# Patient Record
Sex: Male | Born: 1940
Health system: Southern US, Community
[De-identification: ages and names within clinical notes are randomized; demographics above are authoritative.]

## PROBLEM LIST (undated history)

## (undated) DIAGNOSIS — R739 Hyperglycemia, unspecified: Secondary | ICD-10-CM

## (undated) DIAGNOSIS — K219 Gastro-esophageal reflux disease without esophagitis: Secondary | ICD-10-CM

## (undated) DIAGNOSIS — R911 Solitary pulmonary nodule: Secondary | ICD-10-CM

## (undated) DIAGNOSIS — I10 Essential (primary) hypertension: Secondary | ICD-10-CM

## (undated) DIAGNOSIS — M1990 Unspecified osteoarthritis, unspecified site: Secondary | ICD-10-CM

## (undated) DIAGNOSIS — N390 Urinary tract infection, site not specified: Secondary | ICD-10-CM

## (undated) DIAGNOSIS — E785 Hyperlipidemia, unspecified: Secondary | ICD-10-CM

## (undated) DIAGNOSIS — M79671 Pain in right foot: Secondary | ICD-10-CM

## (undated) DIAGNOSIS — G5603 Carpal tunnel syndrome, bilateral upper limbs: Secondary | ICD-10-CM

## (undated) DIAGNOSIS — K759 Inflammatory liver disease, unspecified: Secondary | ICD-10-CM

## (undated) HISTORY — PX: COLONOSCOPY W/ POLYPECTOMY: SHX1380

## (undated) HISTORY — PX: TONSILLECTOMY: SUR1361

## (undated) HISTORY — PX: SKIN CANCER EXCISION: SHX779

## (undated) HISTORY — PX: APPENDECTOMY: SHX54

## (undated) HISTORY — DX: Carpal tunnel syndrome, bilateral upper limbs: G56.03

---

## 1964-06-20 DIAGNOSIS — K759 Inflammatory liver disease, unspecified: Secondary | ICD-10-CM

## 1964-06-20 HISTORY — DX: Inflammatory liver disease, unspecified: K75.9

## 1998-01-29 ENCOUNTER — Other Ambulatory Visit: Admission: RE | Admit: 1998-01-29 | Discharge: 1998-01-29 | Payer: Self-pay | Admitting: Urology

## 1998-06-20 HISTORY — PX: OTHER SURGICAL HISTORY: SHX169

## 1999-09-20 ENCOUNTER — Other Ambulatory Visit: Admission: RE | Admit: 1999-09-20 | Discharge: 1999-09-20 | Payer: Self-pay | Admitting: Urology

## 2000-08-11 ENCOUNTER — Other Ambulatory Visit: Admission: RE | Admit: 2000-08-11 | Discharge: 2000-08-11 | Payer: Self-pay | Admitting: Urology

## 2000-08-11 ENCOUNTER — Encounter (INDEPENDENT_AMBULATORY_CARE_PROVIDER_SITE_OTHER): Payer: Self-pay | Admitting: Specialist

## 2001-05-14 ENCOUNTER — Encounter: Admission: RE | Admit: 2001-05-14 | Discharge: 2001-05-14 | Payer: Self-pay | Admitting: Family Medicine

## 2001-05-14 ENCOUNTER — Encounter: Payer: Self-pay | Admitting: Family Medicine

## 2008-03-10 ENCOUNTER — Encounter: Admission: RE | Admit: 2008-03-10 | Discharge: 2008-03-10 | Payer: Self-pay | Admitting: Internal Medicine

## 2008-08-27 ENCOUNTER — Ambulatory Visit (HOSPITAL_COMMUNITY): Admission: RE | Admit: 2008-08-27 | Discharge: 2008-08-27 | Payer: Self-pay | Admitting: Urology

## 2009-03-16 ENCOUNTER — Encounter: Admission: RE | Admit: 2009-03-16 | Discharge: 2009-03-16 | Payer: Self-pay | Admitting: Internal Medicine

## 2010-07-11 ENCOUNTER — Encounter: Payer: Self-pay | Admitting: Urology

## 2010-07-11 ENCOUNTER — Encounter: Payer: Self-pay | Admitting: Gastroenterology

## 2011-02-03 ENCOUNTER — Observation Stay (HOSPITAL_COMMUNITY)
Admission: EM | Admit: 2011-02-03 | Discharge: 2011-02-04 | Disposition: A | Discharge status: Home / Self Care | Payer: Medicare Other | Attending: Internal Medicine | Admitting: Internal Medicine

## 2011-02-03 DIAGNOSIS — Z7982 Long term (current) use of aspirin: Secondary | ICD-10-CM | POA: Insufficient documentation

## 2011-02-03 DIAGNOSIS — I10 Essential (primary) hypertension: Secondary | ICD-10-CM | POA: Insufficient documentation

## 2011-02-03 DIAGNOSIS — R220 Localized swelling, mass and lump, head: Secondary | ICD-10-CM | POA: Insufficient documentation

## 2011-02-03 DIAGNOSIS — R0602 Shortness of breath: Secondary | ICD-10-CM | POA: Insufficient documentation

## 2011-02-03 DIAGNOSIS — T782XXA Anaphylactic shock, unspecified, initial encounter: Principal | ICD-10-CM | POA: Insufficient documentation

## 2011-02-03 DIAGNOSIS — Z79899 Other long term (current) drug therapy: Secondary | ICD-10-CM | POA: Insufficient documentation

## 2011-02-03 DIAGNOSIS — R221 Localized swelling, mass and lump, neck: Secondary | ICD-10-CM | POA: Insufficient documentation

## 2011-02-03 DIAGNOSIS — E559 Vitamin D deficiency, unspecified: Secondary | ICD-10-CM | POA: Insufficient documentation

## 2011-02-03 DIAGNOSIS — E7800 Pure hypercholesterolemia, unspecified: Secondary | ICD-10-CM | POA: Insufficient documentation

## 2011-02-03 LAB — CBC
HCT: 39.1 % (ref 39.0–52.0)
Hemoglobin: 13.6 g/dL (ref 13.0–17.0)
MCH: 32 pg (ref 26.0–34.0)
MCHC: 34.8 g/dL (ref 30.0–36.0)
MCV: 92 fL (ref 78.0–100.0)
Platelets: 117 10*3/uL — ABNORMAL LOW (ref 150–400)
RBC: 4.25 MIL/uL (ref 4.22–5.81)
RDW: 12.8 % (ref 11.5–15.5)
WBC: 12.8 10*3/uL — ABNORMAL HIGH (ref 4.0–10.5)

## 2011-02-03 LAB — POCT I-STAT, CHEM 8
BUN: 20 mg/dL (ref 6–23)
Calcium, Ion: 1.14 mmol/L (ref 1.12–1.32)
Chloride: 105 mEq/L (ref 96–112)
Creatinine, Ser: 1.2 mg/dL (ref 0.50–1.35)
Glucose, Bld: 149 mg/dL — ABNORMAL HIGH (ref 70–99)
HCT: 39 % (ref 39.0–52.0)
Hemoglobin: 13.3 g/dL (ref 13.0–17.0)
Potassium: 3.5 mEq/L (ref 3.5–5.1)
Sodium: 143 mEq/L (ref 135–145)
TCO2: 23 mmol/L (ref 0–100)

## 2011-02-03 LAB — DIFFERENTIAL
Basophils Absolute: 0.1 10*3/uL (ref 0.0–0.1)
Basophils Relative: 1 % (ref 0–1)
Eosinophils Absolute: 0.3 10*3/uL (ref 0.0–0.7)
Eosinophils Relative: 2 % (ref 0–5)
Lymphocytes Relative: 42 % (ref 12–46)
Lymphs Abs: 5.4 10*3/uL — ABNORMAL HIGH (ref 0.7–4.0)
Monocytes Absolute: 1 10*3/uL (ref 0.1–1.0)
Monocytes Relative: 8 % (ref 3–12)
Neutro Abs: 6 10*3/uL (ref 1.7–7.7)
Neutrophils Relative %: 47 % (ref 43–77)

## 2011-02-04 ENCOUNTER — Inpatient Hospital Stay (HOSPITAL_COMMUNITY): Payer: Medicare Other

## 2011-02-05 NOTE — Discharge Summary (Signed)
  [name] NO.:  [id number]  MEDICAL RECORD NO.:  [id number]  LOCATION:  1436                         FACILITY:  [facility]  PHYSICIAN:  Jonny Ruiz, MD    DATE OF BIRTH:  [birth year]  DATE OF ADMISSION:  02/03/2011 DATE OF DISCHARGE:  02/04/2011                              DISCHARGE SUMMARY   PRIMARY CARE PHYSICIAN:  Dr. Brynda Peon  REASON FOR HOSPITALIZATION:  Numbness of the tongue and throat swelling.  DISCHARGE DIAGNOSIS:  Anaphylactic reaction, etiology unknown.  SECONDARY DIAGNOSES: 1. Hypertension. 2. Hypercholesterolemia. 3. Vitamin D deficiency.  DISCHARGE MEDICATIONS: 1. Medrol Dosek 6 days 2. Zyrtec 10 mg once a day. 3. EpiPen. 4. Bisoprolol/hydrochlorothiazide 10/6.25 mg p.o. daily. 5. Aspirin 81 mg a day. 6. Vitamin D. 7. Simvastatin 20 mg a day.  HISTORY OF PRESENT ILLNESS:  The patient is a 70 year old man who presented to the emergency department complaining of numbness of his tongue and tightness of his throat associated with shortness of breath. For details of his history, please refer to dictated H and P by Dr. Sunnie Nielsen.  HOSPITAL COURSE:  The patient was seen in emergency department where he received an EpiPen as well as Benadryl, Pepcid, and started on Solu- Medrol 125 mg IV.  Subsequently, the patient was admitted by Dr. Sunnie Nielsen from the hospitalist service to telemetry unit for further observation.  His home medications were discontinued, and he was maintained on Benadryl 25 mg q.6 h., methylprednisolone 80 mg IV q.8 h. The patient did well overnight and his symptoms resolved by next day. Today, he is doing well and voices no symptoms.  Specifically, he denies throat discomfort, difficulty breathing, or skin changes.  The patient feels well and is in stable condition to be discharged home.  He has been advised to make an appointment to see his primary care physician, Dr. Terrilee Croak in 1 week to further arrange  for allergies consultation.  CONDITION ON DISCHARGE:  Stable.          ______________________________ Jonny Ruiz, MD     GL/MEDQ  D:  02/04/2011  T:  02/04/2011  Job:  [id number]  cc:   Brynda Peon Fax: [phone]  Electronically Signed by Jonny Ruiz MD on 02/05/2011 10:37:57 PM

## 2011-02-15 NOTE — H&P (Signed)
[name], [name] NO.:  [id number]  MEDICAL RECORD NO.:  [id number]  LOCATION:  WLED                         FACILITY:  [facility]  PHYSICIAN:  Hartley Barefoot, MD    DATE OF BIRTH:  02/01/1941  DATE OF ADMISSION:  02/03/2011 DATE OF DISCHARGE:                             HISTORY & PHYSICAL   CHIEF COMPLAINT:  Numbness on the tongue and throat swelling.  HISTORY OF PRESENT ILLNESS:  This is a 70 year old with past medical history of hypertension, hypercholesterolemia who presented to the emergency department complaining of numbness of his tongue and tightness of his throat and shortness of breath.  The patient related that he was in his usual state of health until this evening when he started to feel numbness of his tongue, then a spread to his mouth, then he felt tightness of his throat and difficulty swallowing and shortness of breath.  He said that he was eating at a restaurant when he started to have these symptoms.  He said that he ate some bread at the restaurant and these symptoms started before he ate salmon, the main course.  He said the morning of admission, he ate some chicken at home.  At the restaurant, he ate bread, salmon, polenta, squash.  He denies any new medication or any thing unusual.  He does relate that he has had 2 episodes of face swelling and a couple of months ago, probably May, June.  He went to urgent care and he was given prednisone taper and he was given an EpiPen.  He was instructed to follow with an allergen doctor, but he was not able to do that.  He did not recall or he did not want to speak about any particular food that might be associated with those 2 episodes back in May, July, he did not want to give too many details.  He said that he also had some poison ivy twice over the last summer and spring, that was during April.  He had some rash on his arms and face.  He was given some prednisone and that has  resolved.  ALLERGIES:  No know drug allergies.  PAST MEDICAL HISTORY: 1. Arthritis. 2. Hypertension. 3. Hypercholesterolemia.  HOME MEDICATIONS: 1. Bisoprolol and hydrochlorothiazide 10/6.25 mg p.o. daily. 2. Epinephrine, EpiPen. 3. Aspirin 81 mg p.o. daily. 4. Vitamin D p.o. daily. 5. Simvastatin 40 mg 0.5 tablets daily. 6. Zyrtec 10 mg p.o. daily. 7. Ibuprofen 200 mg 2-3 tablets every 8 hours as needed for pain.  SOCIAL HISTORY:  He is married.  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  He has 2 daughters and 1 stepdaughter.  He has 5 grandchildren. He denies smoking or recreational drugs.  He drinks rarely 2 bottle of beer every 3 weeks.  FAMILY HISTORY:  His father is 41 year old and he has a history of dementia.  His mother died at 1 years old, sudden death, unclear it was heart attack.  REVIEW OF SYSTEMS:  He does relate some shortness of breath on exertion, but he does not want to discuss about.  PHYSICAL EXAMINATION:  VITAL SIGNS:  Blood pressure 160/90, pulse 76, respirations 18, sat 98 on room  air, temperature 98. GENERAL:  The patient is sitting in bed in no acute distress. HEENT:  Head, traumatic normocephalic.  Eyes, anicteric, pupil equal, reactive to light.  Extraocular muscles intact.  Tongue, no significant swelling.  I was able to see his uvula, it was midline. NECK:  Supple.  No JVD.  No carotid bruits.  No stridor. CARDIOVASCULAR:  S1, S2.  Regular rhythm and rate.  No rubs, murmurs, or gallops. LUNGS:  Bilateral good air movement.  No wheezing, crackles, or rhonchi. EXTREMITIES:  No edema. NEURO:  Alert, oriented x2.  Cranial nerves II through XII intact. Sensation grossly intact.  Motor strength 5/5 throughout.  ASSESSMENT AND PLAN:  This is a 70 year old who presented with early anaphylaxis. 1. Ananaphylaxis, unclear etiology, probably food related.  The     patient is going to be admitted to the hospital for observation to     a telemetry bed.  We  will continue with Solu-Medrol and Benadryl.     His symptoms has improved significantly, initially the ED physician     was not able to see his airway, now we are able to see his uvula     and his airway, his tongue has decreased, he is now able to     swallow, and denies any shortness of breath.  He received     epinephrine in the emergency department.  I would avoid to start     any of his home medications at this time.  He will need to follow     with an allergen doctor.  Continue with Solu-Medrol and Benadryl.     He will need to be discharged on a taper of prednisone. 2. Hypertension.  I will hold on his blood pressure medication at this     time.  Monitor closely for any sign of hypotension in the setting     of early anaphylaxis. 3. Deep venous thrombosis prophylaxis, SCDs at this time.     Hartley Barefoot, MD     BR/MEDQ  D:  02/03/2011  T:  02/04/2011  Job:  [id number]  Electronically Signed by Hartley Barefoot MD on 02/15/2011 10:27:09 AM

## 2011-03-07 ENCOUNTER — Emergency Department (HOSPITAL_COMMUNITY)
Admission: EM | Admit: 2011-03-07 | Discharge: 2011-03-07 | Disposition: A | Discharge status: Home / Self Care | Payer: Medicare Other | Attending: Emergency Medicine | Admitting: Emergency Medicine

## 2011-03-07 DIAGNOSIS — E7800 Pure hypercholesterolemia, unspecified: Secondary | ICD-10-CM | POA: Insufficient documentation

## 2011-03-07 DIAGNOSIS — T7840XA Allergy, unspecified, initial encounter: Secondary | ICD-10-CM | POA: Insufficient documentation

## 2011-03-07 DIAGNOSIS — L500 Allergic urticaria: Secondary | ICD-10-CM | POA: Insufficient documentation

## 2011-03-07 DIAGNOSIS — X58XXXA Exposure to other specified factors, initial encounter: Secondary | ICD-10-CM | POA: Insufficient documentation

## 2011-03-07 DIAGNOSIS — I10 Essential (primary) hypertension: Secondary | ICD-10-CM | POA: Insufficient documentation

## 2011-03-07 DIAGNOSIS — R220 Localized swelling, mass and lump, head: Secondary | ICD-10-CM | POA: Insufficient documentation

## 2011-04-04 ENCOUNTER — Other Ambulatory Visit: Payer: Self-pay | Admitting: Allergy and Immunology

## 2011-04-04 DIAGNOSIS — J329 Chronic sinusitis, unspecified: Secondary | ICD-10-CM

## 2011-04-06 ENCOUNTER — Ambulatory Visit
Admission: RE | Admit: 2011-04-06 | Discharge: 2011-04-06 | Disposition: A | Discharge status: Home / Self Care | Payer: Medicare Other | Source: Ambulatory Visit | Attending: Allergy and Immunology | Admitting: Allergy and Immunology

## 2011-04-06 DIAGNOSIS — J329 Chronic sinusitis, unspecified: Secondary | ICD-10-CM

## 2012-01-21 ENCOUNTER — Ambulatory Visit (INDEPENDENT_AMBULATORY_CARE_PROVIDER_SITE_OTHER): Payer: Medicare Other | Admitting: Emergency Medicine

## 2012-01-21 VITALS — BP 124/86 | HR 67 | Temp 98.0°F | Resp 16

## 2012-01-21 DIAGNOSIS — M79609 Pain in unspecified limb: Secondary | ICD-10-CM

## 2012-01-21 DIAGNOSIS — M79604 Pain in right leg: Secondary | ICD-10-CM

## 2012-01-21 DIAGNOSIS — M79605 Pain in left leg: Secondary | ICD-10-CM

## 2012-01-21 MED ORDER — TRAMADOL HCL 50 MG PO TABS: 100.0000 mg | ORAL_TABLET | Freq: Three times a day (TID) | ORAL | Status: AC | PRN

## 2012-01-21 NOTE — Progress Notes (Signed)
   Date:  01/21/2012   Name:  [name]   DOB:  08/28/1940   MRN:  [id number]  PCP:  Pearla Dubonnet, MD    Chief Complaint: Leg Pain   History of Present Illness:  [name] is a 71 y.o. very pleasant male patient who presents with the following:  Several month history of pain in posterior thigh proximal to knees and now radiating into proximal calf.  Some pain deep in right buttock.  Leg pain is bilateral.  Waxes and wanes never gone.  No weakness or numbness.  Pain regardless of posture.  No history of back pain or history of injury.  No claudication or history of vascular disorder  There is no problem list on file for this patient.   No past medical history on file.  No past surgical history on file.  History  Substance Use Topics  . Smoking status: Never Smoker   . Smokeless tobacco: Not on file  . Alcohol Use: Not on file    No family history on file.  No Known Allergies  Medication list has been reviewed and updated.  Current Outpatient Prescriptions on File Prior to Visit  Medication Sig Dispense Refill  . loratadine (CLARITIN) 10 MG tablet Take 10 mg by mouth daily.      . simvastatin (ZOCOR) 20 MG tablet Take 20 mg by mouth every evening.        Review of Systems:  As per HPI, otherwise negative.    Physical Examination: Filed Vitals:   01/21/12 0858  BP: 124/86  Pulse: 67  Temp: 98 F (36.7 C)  Resp: 16   There were no vitals filed for this visit. There is no height or weight on file to calculate BMI. Ideal Body Weight:    GEN: WDWN, NAD, Non-toxic, A & O x 3 HEENT: Atraumatic, Normocephalic. Neck supple. No masses, No LAD. Ears and Nose: No external deformity. CV: RRR, No M/G/R. No JVD. No thrill. No extra heart sounds. PULM: CTA B, no wheezes, crackles, rhonchi. No retractions. No resp. distress. No accessory muscle use. ABD: S, NT, ND, +BS. No rebound. No HSM. EXTR: No c/c/e NEURO Normal gait. Gross motor strength and  DTR's intact Vascular:  DP and PT pulses absent bilaterally.  Little hair from knees.  Hair on toes. PSYCH: Normally interactive. Conversant. Not depressed or anxious appearing.  Calm demeanor.    Assessment and Plan: PVD vs Spinal impingement MRI Lower ext ultrasound Ultram Follow up after studies and as needed  Carmelina Dane, MD

## 2012-01-21 NOTE — Patient Instructions (Signed)
Radicular Pain Radicular pain in either the arm or leg is usually from a bulging or herniated disk in the spine. A piece of the herniated disk may press against the nerves as the nerves exit the spine. This causes pain which is felt at the tips of the nerves down the arm or leg. Other causes of radicular pain may include:  Fractures.   Heart disease.   Cancer.   An abnormal and usually degenerative state of the nervous system or nerves (neuropathy).  Diagnosis may require CT or MRI scanning to determine the primary cause.  Nerves that start at the neck (nerve roots) may cause radicular pain in the outer shoulder and arm. It can spread down to the thumb and fingers. The symptoms vary depending on which nerve root has been affected. In most cases radicular pain improves with conservative treatment. Neck problems may require physical therapy, a neck collar, or cervical traction. Treatment may take many weeks, and surgery may be considered if the symptoms do not improve.  Conservative treatment is also recommended for sciatica. Sciatica causes pain to radiate from the lower back or buttock area down the leg into the foot. Often there is a history of back problems. Most patients with sciatica are better after 2 to 4 weeks of rest and other supportive care. Short term bed rest can reduce the disk pressure considerably. Sitting, however, is not a good position since this increases the pressure on the disk. You should avoid bending, lifting, and all other activities which make the problem worse. Traction can be used in severe cases. Surgery is usually reserved for patients who do not improve within the first months of treatment. Only take over-the-counter or prescription medicines for pain, discomfort, or fever as directed by your caregiver. Narcotics and muscle relaxants may help by relieving more severe pain and spasm and by providing mild sedation. Cold or massage can give significant relief. Spinal  manipulation is not recommended. It can increase the degree of disc protrusion. Epidural steroid injections are often effective treatment for radicular pain. These injections deliver medicine to the spinal nerve in the space between the protective covering of the spinal cord and back bones (vertebrae). Your caregiver can give you more information about steroid injections. These injections are most effective when given within two weeks of the onset of pain.  You should see your caregiver for follow up care as recommended. A program for neck and back injury rehabilitation with stretching and strengthening exercises is an important part of management.  SEEK IMMEDIATE MEDICAL CARE IF:  You develop increased pain, weakness, or numbness in your arm or leg.   You develop difficulty with bladder or bowel control.   You develop abdominal pain.  Document Released: 07/14/2004 Document Revised: 05/26/2011 Document Reviewed: 09/29/2008 Exit[employer] Patient Information 2012 [employer]. 

## 2012-01-25 ENCOUNTER — Telehealth: Payer: Self-pay

## 2012-01-25 NOTE — Telephone Encounter (Signed)
Pt still in leg pain and was inquiring about the MRI that had been ordered for him by Dr. Dareen Piano on 01/21/12.  Explained that [employer] medicare had not yet approved the scan.  Checked with our clinical staff and [employer] has now requested Peer to Peer review.  Pt understands, but would like to be scheduled ASAP after approval.

## 2012-01-26 NOTE — Telephone Encounter (Signed)
I called and tried to do a peer to peer for pt's MRI but they were unable to cover it because he had no neurological symptoms on exam and no conservative treatment like PT had been done.  Reviewer suggested to withdraw vs deny the request due to not being able to obtain another request for MRI for 60d after denial.  I called and spoke with patient about ordering dopplers and ABI for LE and pt declined the referral at this time.  I suggested he have xrays performed or RTC because his pain was worsening.  Pt agreed and will call back when he makes up his mind about the next step.

## 2012-01-26 NOTE — Telephone Encounter (Signed)
This is for a PA to call for peer to peer review, will have someone call today.

## 2012-01-27 ENCOUNTER — Telehealth: Payer: Self-pay

## 2012-01-27 NOTE — Telephone Encounter (Signed)
Pt would like to talk to Dr Dareen Piano regarding his mri and it being denied. Pt is upset. He says this is urgent.

## 2012-01-27 NOTE — Telephone Encounter (Signed)
Patient MRI was denied by insurance company patient has been advised to return to clinic for follow up will not speak to me concerning this. Upset because Dr Dareen Piano did not call him but had Maralyn Sago call him. He wants to know how to proceed from here, patient states [employer] has denied b/c he has not had physical therapy for this condition, also patient states he is willing to proceed with a doppler study if Dr Dareen Piano feels this is the best study, this is per the [employer] denial. Patient is very angry on the phone and demands Dr Dareen Piano call him back himself. He does not want me to return his call. I advised the call may not be today. He disconnected our phone call and is obviously angry at Dr Dareen Piano, I have advised this denial is from insurance company, not from Dr Dareen Piano.

## 2012-02-01 NOTE — Telephone Encounter (Signed)
Pt is getting more and more upset with dr Dareen Piano, he says that he should of heard from him on Monday but did not. I explained to him that he was not in on Tuesday or Wednesday, that I would put urgent message for him to call him back on Thursday .

## 2012-02-03 ENCOUNTER — Other Ambulatory Visit: Payer: Self-pay | Admitting: Internal Medicine

## 2012-02-03 DIAGNOSIS — M79605 Pain in left leg: Secondary | ICD-10-CM

## 2012-02-03 DIAGNOSIS — M79604 Pain in right leg: Secondary | ICD-10-CM

## 2012-02-03 DIAGNOSIS — I739 Peripheral vascular disease, unspecified: Secondary | ICD-10-CM

## 2012-02-06 ENCOUNTER — Other Ambulatory Visit: Payer: Self-pay | Admitting: Internal Medicine

## 2012-02-06 ENCOUNTER — Ambulatory Visit
Admission: RE | Admit: 2012-02-06 | Discharge: 2012-02-06 | Disposition: A | Discharge status: Home / Self Care | Payer: Medicare Other | Source: Ambulatory Visit | Attending: Internal Medicine | Admitting: Internal Medicine

## 2012-02-06 DIAGNOSIS — M79604 Pain in right leg: Secondary | ICD-10-CM

## 2012-02-06 DIAGNOSIS — M5416 Radiculopathy, lumbar region: Secondary | ICD-10-CM

## 2012-02-06 DIAGNOSIS — M79605 Pain in left leg: Secondary | ICD-10-CM

## 2012-02-08 ENCOUNTER — Ambulatory Visit
Admission: RE | Admit: 2012-02-08 | Discharge: 2012-02-08 | Disposition: A | Discharge status: Home / Self Care | Payer: Medicare Other | Source: Ambulatory Visit | Attending: Internal Medicine | Admitting: Internal Medicine

## 2012-02-08 DIAGNOSIS — M5416 Radiculopathy, lumbar region: Secondary | ICD-10-CM

## 2012-02-10 ENCOUNTER — Other Ambulatory Visit: Payer: Medicare Other

## 2012-03-05 ENCOUNTER — Other Ambulatory Visit: Payer: Self-pay | Admitting: Neurological Surgery

## 2012-03-05 DIAGNOSIS — M549 Dorsalgia, unspecified: Secondary | ICD-10-CM

## 2012-05-14 ENCOUNTER — Other Ambulatory Visit: Payer: Self-pay | Admitting: Neurological Surgery

## 2012-05-26 ENCOUNTER — Other Ambulatory Visit: Payer: Self-pay

## 2012-05-26 MED ORDER — EPINEPHRINE 0.3 MG/0.3ML IJ DEVI: 0.3000 mg | Freq: Once | INTRAMUSCULAR | Status: AC

## 2012-06-15 ENCOUNTER — Encounter (HOSPITAL_COMMUNITY): Payer: Self-pay | Admitting: Respiratory Therapy

## 2012-06-18 NOTE — Pre-Procedure Instructions (Signed)
20 [name]  06/18/2012   Your procedure is scheduled on:  Wednesday June 27, 2012.  Report to [facility] at 0630 AM.  Call this number if you have problems the morning of surgery: [phone]   Remember:   Do not eat food or drink:After Midnight.    Take these medicines the morning of surgery with A SIP OF WATER: Bisoprolol (Ziac)   Do not wear jewelry.  Do not wear lotions or colognes.   Men may shave face and neck.  Do not bring valuables to the hospital.  Contacts, dentures or bridgework may not be worn into surgery.  Leave suitcase in the car. After surgery it may be brought to your room.  For patients admitted to the hospital, checkout time is 11:00 AM the day of discharge.   Patients discharged the day of surgery will not be allowed to drive home.  Name and phone number of your driver:   Special Instructions: Shower using CHG 2 nights before surgery and the night before surgery.  If you shower the day of surgery use CHG.  Use special wash - you have one bottle of CHG for all showers.  You should use approximately 1/3 of the bottle for each shower.   Please read over the following fact sheets that you were given: Pain Booklet, Coughing and Deep Breathing, MRSA Information and Surgical Site Infection Prevention

## 2012-06-19 ENCOUNTER — Encounter (HOSPITAL_COMMUNITY)
Admission: RE | Admit: 2012-06-19 | Discharge: 2012-06-19 | Disposition: A | Discharge status: Home / Self Care | Payer: Medicare Other | Source: Ambulatory Visit | Attending: Neurological Surgery | Admitting: Neurological Surgery

## 2012-06-19 ENCOUNTER — Encounter (HOSPITAL_COMMUNITY): Payer: Self-pay

## 2012-06-19 HISTORY — DX: Solitary pulmonary nodule: R91.1

## 2012-06-19 HISTORY — DX: Essential (primary) hypertension: I10

## 2012-06-19 HISTORY — DX: Gastro-esophageal reflux disease without esophagitis: K21.9

## 2012-06-19 HISTORY — DX: Inflammatory liver disease, unspecified: K75.9

## 2012-06-19 HISTORY — DX: Hyperlipidemia, unspecified: E78.5

## 2012-06-19 HISTORY — DX: Unspecified osteoarthritis, unspecified site: M19.90

## 2012-06-19 LAB — BASIC METABOLIC PANEL
BUN: 18 mg/dL (ref 6–23)
CO2: 27 mEq/L (ref 19–32)
Calcium: 9.5 mg/dL (ref 8.4–10.5)
Chloride: 105 mEq/L (ref 96–112)
Creatinine, Ser: 1.1 mg/dL (ref 0.50–1.35)
GFR calc Af Amer: 76 mL/min — ABNORMAL LOW (ref >90)
GFR calc non Af Amer: 66 mL/min — ABNORMAL LOW (ref >90)
Glucose, Bld: 95 mg/dL (ref 70–99)
Potassium: 4 mEq/L (ref 3.5–5.1)
Sodium: 141 mEq/L (ref 135–145)

## 2012-06-19 LAB — CBC WITH DIFFERENTIAL/PLATELET
Basophils Absolute: 0.1 10*3/uL (ref 0.0–0.1)
Basophils Relative: 1 % (ref 0–1)
Eosinophils Absolute: 0.3 10*3/uL (ref 0.0–0.7)
Eosinophils Relative: 3 % (ref 0–5)
HCT: 44.3 % (ref 39.0–52.0)
Hemoglobin: 15.6 g/dL (ref 13.0–17.0)
Lymphocytes Relative: 43 % (ref 12–46)
Lymphs Abs: 4.6 10*3/uL — ABNORMAL HIGH (ref 0.7–4.0)
MCH: 31.9 pg (ref 26.0–34.0)
MCHC: 35.2 g/dL (ref 30.0–36.0)
MCV: 90.6 fL (ref 78.0–100.0)
Monocytes Absolute: 0.8 10*3/uL (ref 0.1–1.0)
Monocytes Relative: 7 % (ref 3–12)
Neutro Abs: 5 10*3/uL (ref 1.7–7.7)
Neutrophils Relative %: 46 % (ref 43–77)
Platelets: 147 10*3/uL — ABNORMAL LOW (ref 150–400)
RBC: 4.89 MIL/uL (ref 4.22–5.81)
RDW: 13 % (ref 11.5–15.5)
WBC: 10.8 10*3/uL — ABNORMAL HIGH (ref 4.0–10.5)

## 2012-06-19 LAB — SURGICAL PCR SCREEN
MRSA, PCR: NEGATIVE
Staphylococcus aureus: NEGATIVE

## 2012-06-19 LAB — PROTIME-INR
INR: 0.9 (ref 0.00–1.49)
Prothrombin Time: 12.1 seconds (ref 11.6–15.2)

## 2012-06-19 NOTE — Progress Notes (Signed)
Patient informed Nurse that he had a stress test approximately 3 years ago at [place] [facility]. Results requested. Patient denied having a cardiac cath or sleep study. Dr. Florene Glen is PCP as LOV was a few months ago. Will request results.

## 2012-06-26 MED ORDER — CEFAZOLIN SODIUM-DEXTROSE 2-3 GM-% IV SOLR
2.0000 g | INTRAVENOUS | Status: AC
  Administered 2012-06-27: 2 g via INTRAVENOUS
  Filled 2012-06-26: qty 50

## 2012-06-27 ENCOUNTER — Encounter (HOSPITAL_COMMUNITY): Payer: Self-pay

## 2012-06-27 ENCOUNTER — Ambulatory Visit (HOSPITAL_COMMUNITY): Payer: Medicare Other | Admitting: Anesthesiology

## 2012-06-27 ENCOUNTER — Encounter (HOSPITAL_COMMUNITY)
Admission: RE | Disposition: A | Discharge status: Home / Self Care | Payer: Self-pay | Source: Ambulatory Visit | Attending: Neurological Surgery

## 2012-06-27 ENCOUNTER — Encounter (HOSPITAL_COMMUNITY): Payer: Self-pay | Admitting: Anesthesiology

## 2012-06-27 ENCOUNTER — Ambulatory Visit (HOSPITAL_COMMUNITY)
Admission: RE | Admit: 2012-06-27 | Discharge: 2012-06-30 | Disposition: A | Discharge status: Home / Self Care | Payer: Medicare Other | Source: Ambulatory Visit | Attending: Neurological Surgery | Admitting: Neurological Surgery

## 2012-06-27 ENCOUNTER — Ambulatory Visit (HOSPITAL_COMMUNITY): Payer: Medicare Other

## 2012-06-27 DIAGNOSIS — M48061 Spinal stenosis, lumbar region without neurogenic claudication: Secondary | ICD-10-CM | POA: Insufficient documentation

## 2012-06-27 DIAGNOSIS — Z01818 Encounter for other preprocedural examination: Secondary | ICD-10-CM | POA: Insufficient documentation

## 2012-06-27 DIAGNOSIS — Z01812 Encounter for preprocedural laboratory examination: Secondary | ICD-10-CM | POA: Insufficient documentation

## 2012-06-27 DIAGNOSIS — Z01810 Encounter for preprocedural cardiovascular examination: Secondary | ICD-10-CM | POA: Insufficient documentation

## 2012-06-27 DIAGNOSIS — IMO0002 Reserved for concepts with insufficient information to code with codable children: Secondary | ICD-10-CM | POA: Insufficient documentation

## 2012-06-27 DIAGNOSIS — R339 Retention of urine, unspecified: Secondary | ICD-10-CM | POA: Insufficient documentation

## 2012-06-27 DIAGNOSIS — I10 Essential (primary) hypertension: Secondary | ICD-10-CM | POA: Insufficient documentation

## 2012-06-27 DIAGNOSIS — N9989 Other postprocedural complications and disorders of genitourinary system: Secondary | ICD-10-CM | POA: Insufficient documentation

## 2012-06-27 DIAGNOSIS — Y834 Other reconstructive surgery as the cause of abnormal reaction of the patient, or of later complication, without mention of misadventure at the time of the procedure: Secondary | ICD-10-CM | POA: Insufficient documentation

## 2012-06-27 HISTORY — PX: LUMBAR LAMINECTOMY/DECOMPRESSION MICRODISCECTOMY: SHX5026

## 2012-06-27 SURGERY — LUMBAR LAMINECTOMY/DECOMPRESSION MICRODISCECTOMY 1 LEVEL
Anesthesia: General | Site: Back | Laterality: Bilateral | Wound class: Clean

## 2012-06-27 MED ORDER — HYDROMORPHONE HCL PF 1 MG/ML IJ SOLN
0.2500 mg | INTRAMUSCULAR | Status: DC | PRN
  Administered 2012-06-27 (×4): 0.5 mg via INTRAVENOUS

## 2012-06-27 MED ORDER — SODIUM CHLORIDE 0.9 % IR SOLN
Status: DC | PRN
  Administered 2012-06-27: 09:00:00

## 2012-06-27 MED ORDER — THROMBIN 5000 UNITS EX SOLR
CUTANEOUS | Status: DC | PRN
  Administered 2012-06-27 (×2): 5000 [IU] via TOPICAL

## 2012-06-27 MED ORDER — MENTHOL 3 MG MT LOZG: 1.0000 | LOZENGE | OROMUCOSAL | Status: DC | PRN

## 2012-06-27 MED ORDER — BISOPROLOL-HYDROCHLOROTHIAZIDE 10-6.25 MG PO TABS
1.0000 | ORAL_TABLET | Freq: Every day | ORAL | Status: DC
  Filled 2012-06-27 (×4): qty 1

## 2012-06-27 MED ORDER — LACTATED RINGERS IV SOLN
INTRAVENOUS | Status: DC | PRN
  Administered 2012-06-27 (×2): via INTRAVENOUS

## 2012-06-27 MED ORDER — SODIUM CHLORIDE 0.9 % IJ SOLN: 3.0000 mL | INTRAMUSCULAR | Status: DC | PRN

## 2012-06-27 MED ORDER — ONDANSETRON HCL 4 MG/2ML IJ SOLN: 4.0000 mg | Freq: Once | INTRAMUSCULAR | Status: DC | PRN

## 2012-06-27 MED ORDER — HYDROMORPHONE HCL PF 1 MG/ML IJ SOLN
INTRAMUSCULAR | Status: AC
  Filled 2012-06-27: qty 1

## 2012-06-27 MED ORDER — PROPOFOL 10 MG/ML IV BOLUS
INTRAVENOUS | Status: DC | PRN
  Administered 2012-06-27: 200 mg via INTRAVENOUS

## 2012-06-27 MED ORDER — SODIUM CHLORIDE 0.9 % IV SOLN
INTRAVENOUS | Status: AC
  Filled 2012-06-27: qty 500

## 2012-06-27 MED ORDER — LIDOCAINE HCL (CARDIAC) 20 MG/ML IV SOLN
INTRAVENOUS | Status: DC | PRN
  Administered 2012-06-27: 60 mg via INTRAVENOUS

## 2012-06-27 MED ORDER — POTASSIUM CHLORIDE IN NACL 20-0.9 MEQ/L-% IV SOLN
INTRAVENOUS | Status: DC
Start: 2012-06-27 — End: 2012-06-30
  Filled 2012-06-27 (×7): qty 1000

## 2012-06-27 MED ORDER — DEXAMETHASONE SODIUM PHOSPHATE 10 MG/ML IJ SOLN: 10.0000 mg | INTRAMUSCULAR | Status: DC

## 2012-06-27 MED ORDER — CYCLOBENZAPRINE HCL 10 MG PO TABS
10.0000 mg | ORAL_TABLET | Freq: Three times a day (TID) | ORAL | Status: DC | PRN
  Administered 2012-06-27 – 2012-06-29 (×3): 10 mg via ORAL
  Filled 2012-06-27 (×3): qty 1

## 2012-06-27 MED ORDER — NEOSTIGMINE METHYLSULFATE 1 MG/ML IJ SOLN
INTRAMUSCULAR | Status: DC | PRN
  Administered 2012-06-27: 5 mg via INTRAVENOUS

## 2012-06-27 MED ORDER — LIDOCAINE HCL 4 % MT SOLN
OROMUCOSAL | Status: DC | PRN
  Administered 2012-06-27: 4 mL via TOPICAL

## 2012-06-27 MED ORDER — ACETAMINOPHEN 10 MG/ML IV SOLN
INTRAVENOUS | Status: AC
  Administered 2012-06-27: 1000 mg via INTRAVENOUS
  Filled 2012-06-27: qty 100

## 2012-06-27 MED ORDER — ARTIFICIAL TEARS OP OINT
TOPICAL_OINTMENT | OPHTHALMIC | Status: DC | PRN
  Administered 2012-06-27: 1 via OPHTHALMIC

## 2012-06-27 MED ORDER — ACETAMINOPHEN 325 MG PO TABS: 650.0000 mg | ORAL_TABLET | ORAL | Status: DC | PRN

## 2012-06-27 MED ORDER — ONDANSETRON HCL 4 MG/2ML IJ SOLN: 4.0000 mg | INTRAMUSCULAR | Status: DC | PRN

## 2012-06-27 MED ORDER — ACETAMINOPHEN 650 MG RE SUPP: 650.0000 mg | RECTAL | Status: DC | PRN

## 2012-06-27 MED ORDER — ONDANSETRON HCL 4 MG/2ML IJ SOLN
INTRAMUSCULAR | Status: DC | PRN
  Administered 2012-06-27: 4 mg via INTRAVENOUS

## 2012-06-27 MED ORDER — HYDROCODONE-ACETAMINOPHEN 5-325 MG PO TABS
1.0000 | ORAL_TABLET | ORAL | Status: DC | PRN
  Administered 2012-06-27: 2 via ORAL
  Administered 2012-06-28 – 2012-06-29 (×3): 1 via ORAL
  Filled 2012-06-27: qty 1
  Filled 2012-06-27: qty 2
  Filled 2012-06-27 (×2): qty 1

## 2012-06-27 MED ORDER — 0.9 % SODIUM CHLORIDE (POUR BTL) OPTIME
TOPICAL | Status: DC | PRN
  Administered 2012-06-27: 1000 mL

## 2012-06-27 MED ORDER — ACETAMINOPHEN 10 MG/ML IV SOLN
1000.0000 mg | Freq: Four times a day (QID) | INTRAVENOUS | Status: AC
  Administered 2012-06-27 – 2012-06-28 (×4): 1000 mg via INTRAVENOUS
  Filled 2012-06-27 (×4): qty 100

## 2012-06-27 MED ORDER — HEMOSTATIC AGENTS (NO CHARGE) OPTIME
TOPICAL | Status: DC | PRN
  Administered 2012-06-27: 1 via TOPICAL

## 2012-06-27 MED ORDER — GLYCOPYRROLATE 0.2 MG/ML IJ SOLN
INTRAMUSCULAR | Status: DC | PRN
  Administered 2012-06-27: .6 mg via INTRAVENOUS

## 2012-06-27 MED ORDER — MIDAZOLAM HCL 5 MG/5ML IJ SOLN
INTRAMUSCULAR | Status: DC | PRN
  Administered 2012-06-27: 1 mg via INTRAVENOUS

## 2012-06-27 MED ORDER — ROCURONIUM BROMIDE 100 MG/10ML IV SOLN
INTRAVENOUS | Status: DC | PRN
  Administered 2012-06-27: 50 mg via INTRAVENOUS

## 2012-06-27 MED ORDER — BUPIVACAINE HCL (PF) 0.25 % IJ SOLN
INTRAMUSCULAR | Status: DC | PRN
  Administered 2012-06-27: 3 mL

## 2012-06-27 MED ORDER — BACITRACIN 50000 UNITS IM SOLR
INTRAMUSCULAR | Status: AC
  Filled 2012-06-27: qty 1

## 2012-06-27 MED ORDER — FENTANYL CITRATE 0.05 MG/ML IJ SOLN
INTRAMUSCULAR | Status: DC | PRN
  Administered 2012-06-27: 150 ug via INTRAVENOUS

## 2012-06-27 MED ORDER — CEFAZOLIN SODIUM 1-5 GM-% IV SOLN
1.0000 g | Freq: Three times a day (TID) | INTRAVENOUS | Status: AC
  Administered 2012-06-27 (×2): 1 g via INTRAVENOUS
  Filled 2012-06-27 (×2): qty 50

## 2012-06-27 MED ORDER — MORPHINE SULFATE 2 MG/ML IJ SOLN: 1.0000 mg | INTRAMUSCULAR | Status: DC | PRN

## 2012-06-27 MED ORDER — TAMSULOSIN HCL 0.4 MG PO CAPS
0.4000 mg | ORAL_CAPSULE | Freq: Every day | ORAL | Status: DC
  Administered 2012-06-27 – 2012-06-29 (×3): 0.4 mg via ORAL
  Filled 2012-06-27 (×4): qty 1

## 2012-06-27 MED ORDER — PHENOL 1.4 % MT LIQD: 1.0000 | OROMUCOSAL | Status: DC | PRN

## 2012-06-27 MED ORDER — SODIUM CHLORIDE 0.9 % IJ SOLN
3.0000 mL | Freq: Two times a day (BID) | INTRAMUSCULAR | Status: DC
  Administered 2012-06-27 – 2012-06-29 (×5): 3 mL via INTRAVENOUS

## 2012-06-27 MED ORDER — EPHEDRINE SULFATE 50 MG/ML IJ SOLN
INTRAMUSCULAR | Status: DC | PRN
  Administered 2012-06-27 (×2): 10 mg via INTRAVENOUS

## 2012-06-27 MED ORDER — DEXAMETHASONE SODIUM PHOSPHATE 10 MG/ML IJ SOLN
INTRAMUSCULAR | Status: AC
  Administered 2012-06-27: 10 mg via INTRAVENOUS
  Filled 2012-06-27: qty 1

## 2012-06-27 SURGICAL SUPPLY — 48 items
ADH SKN CLS APL DERMABOND .7 (GAUZE/BANDAGES/DRESSINGS) ×1
APL SKNCLS STERI-STRIP NONHPOA (GAUZE/BANDAGES/DRESSINGS) ×1
BAG DECANTER FOR FLEXI CONT (MISCELLANEOUS) ×2 IMPLANT
BENZOIN TINCTURE PRP APPL 2/3 (GAUZE/BANDAGES/DRESSINGS) ×2 IMPLANT
BUR MATCHSTICK NEURO 3.0 LAGG (BURR) ×2 IMPLANT
CANISTER SUCTION 2500CC (MISCELLANEOUS) ×2 IMPLANT
CLOTH BEACON ORANGE TIMEOUT ST (SAFETY) ×2 IMPLANT
CONT SPEC 4OZ CLIKSEAL STRL BL (MISCELLANEOUS) ×2 IMPLANT
DERMABOND ADVANCED (GAUZE/BANDAGES/DRESSINGS) ×1
DERMABOND ADVANCED .7 DNX12 (GAUZE/BANDAGES/DRESSINGS) IMPLANT
DRAPE LAPAROTOMY 100X72X124 (DRAPES) ×2 IMPLANT
DRAPE MICROSCOPE ZEISS OPMI (DRAPES) ×2 IMPLANT
DRAPE POUCH INSTRU U-SHP 10X18 (DRAPES) ×2 IMPLANT
DRAPE SURG 17X23 STRL (DRAPES) ×2 IMPLANT
DRESSING TELFA 8X3 (GAUZE/BANDAGES/DRESSINGS) ×2 IMPLANT
DRSG OPSITE 4X5.5 SM (GAUZE/BANDAGES/DRESSINGS) ×2 IMPLANT
DURAPREP 26ML APPLICATOR (WOUND CARE) ×2 IMPLANT
ELECT REM PT RETURN 9FT ADLT (ELECTROSURGICAL) ×2
ELECTRODE REM PT RTRN 9FT ADLT (ELECTROSURGICAL) ×1 IMPLANT
GAUZE SPONGE 4X4 16PLY XRAY LF (GAUZE/BANDAGES/DRESSINGS) IMPLANT
GLOVE BIO SURGEON STRL SZ8 (GLOVE) ×2 IMPLANT
GLOVE BIOGEL PI IND STRL 6.5 (GLOVE) IMPLANT
GLOVE BIOGEL PI INDICATOR 6.5 (GLOVE) ×3
GLOVE INDICATOR 7.0 STRL GRN (GLOVE) ×2 IMPLANT
GOWN BRE IMP SLV AUR LG STRL (GOWN DISPOSABLE) IMPLANT
GOWN BRE IMP SLV AUR XL STRL (GOWN DISPOSABLE) ×3 IMPLANT
GOWN STRL REIN 2XL LVL4 (GOWN DISPOSABLE) IMPLANT
HEMOSTAT POWDER KIT SURGIFOAM (HEMOSTASIS) IMPLANT
KIT BASIN OR (CUSTOM PROCEDURE TRAY) ×2 IMPLANT
KIT ROOM TURNOVER OR (KITS) ×2 IMPLANT
NDL HYPO 25X1 1.5 SAFETY (NEEDLE) ×1 IMPLANT
NDL SPNL 20GX3.5 QUINCKE YW (NEEDLE) IMPLANT
NEEDLE HYPO 25X1 1.5 SAFETY (NEEDLE) ×2 IMPLANT
NEEDLE SPNL 20GX3.5 QUINCKE YW (NEEDLE) ×2 IMPLANT
NS IRRIG 1000ML POUR BTL (IV SOLUTION) ×2 IMPLANT
PACK LAMINECTOMY NEURO (CUSTOM PROCEDURE TRAY) ×2 IMPLANT
PAD ARMBOARD 7.5X6 YLW CONV (MISCELLANEOUS) ×6 IMPLANT
RUBBERBAND STERILE (MISCELLANEOUS) ×4 IMPLANT
SPONGE SURGIFOAM ABS GEL SZ50 (HEMOSTASIS) ×2 IMPLANT
STRIP CLOSURE SKIN 1/2X4 (GAUZE/BANDAGES/DRESSINGS) ×2 IMPLANT
SUT VIC AB 0 CT1 18XCR BRD8 (SUTURE) ×1 IMPLANT
SUT VIC AB 0 CT1 8-18 (SUTURE) ×2
SUT VIC AB 2-0 CP2 18 (SUTURE) ×2 IMPLANT
SUT VIC AB 3-0 SH 8-18 (SUTURE) ×3 IMPLANT
SYR 20ML ECCENTRIC (SYRINGE) ×2 IMPLANT
TOWEL OR 17X24 6PK STRL BLUE (TOWEL DISPOSABLE) ×2 IMPLANT
TOWEL OR 17X26 10 PK STRL BLUE (TOWEL DISPOSABLE) ×2 IMPLANT
WATER STERILE IRR 1000ML POUR (IV SOLUTION) ×2 IMPLANT

## 2012-06-27 NOTE — Plan of Care (Signed)
Problem: Consults Goal: Diagnosis - Spinal Surgery Outcome: Completed/Met Date Met:  06/27/12 Lumbar Laminectomy (Complex)     

## 2012-06-27 NOTE — Transfer of Care (Signed)
Immediate Anesthesia Transfer of Care Note  Patient: [name]  Procedure(s) Performed: Procedure(s) (LRB) with comments: LUMBAR LAMINECTOMY/DECOMPRESSION MICRODISCECTOMY 1 LEVEL (Bilateral) - bilateral four-five laminectony  Patient Location: PACU  Anesthesia Type:General  Level of Consciousness: awake, sedated and patient cooperative  Airway & Oxygen Therapy: Patient Spontanous Breathing and Patient connected to face mask oxygen  Post-op Assessment: Report given to PACU RN, Patient moving all extremities and Patient moving all extremities X 4  Post vital signs: Reviewed and stable  Complications: No apparent anesthesia complications

## 2012-06-27 NOTE — Progress Notes (Signed)
Patient ID: [name], male   DOB: 11-29-1940, 72 y.o.   MRN: [id number] Doing well post-op though he did require I/O cath. No real leg pain, good strength, ambulating well. Flomax for urinary retention.

## 2012-06-27 NOTE — Anesthesia Postprocedure Evaluation (Signed)
  Anesthesia Post-op Note  Patient: [name]  Procedure(s) Performed: Procedure(s) (LRB) with comments: LUMBAR LAMINECTOMY/DECOMPRESSION MICRODISCECTOMY 1 LEVEL (Bilateral) - bilateral four-five laminectony  Patient Location: PACU  Anesthesia Type:General  Level of Consciousness: awake, alert , oriented and patient cooperative  Airway and Oxygen Therapy: Patient Spontanous Breathing  Post-op Pain: mild  Post-op Assessment: Post-op Vital signs reviewed, Patient's Cardiovascular Status Stable, Respiratory Function Stable, Patent Airway, No signs of Nausea or vomiting and Pain level controlled  Post-op Vital Signs: stable  Complications: No apparent anesthesia complications

## 2012-06-27 NOTE — H&P (Signed)
Subjective: Patient is a 72 y.o. male admitted for DLL L4-5 for stenosis. Onset of symptoms was several months ago, gradually worsening since that time.  The pain is rated moderate, and is located at the across the lower back and radiates to BLE. The pain is described as aching and occurs intermittently. The symptoms have been progressive. Symptoms are exacerbated by exercise. MRI or CT showed stenosis.   Past Medical History  Diagnosis Date  . Hypertension   . Arthritis   . Hepatitis 1966    Drug reaction after taking medication  . Hyperlipemia   . Lung nodule seen on imaging study     bilateral lungs  . GERD (gastroesophageal reflux disease)     Past Surgical History  Procedure Date  . Tonsillectomy   . Appendectomy   . Colonoscopy w/ polypectomy   . Arthroscoyp 2000    left knee  . Skin cancer excision 5-6 years ago    moe-s surgery- basal b    Prior to Admission medications   Medication Sig Start Date End Date Taking? Authorizing Provider  acetaminophen (TYLENOL) 325 MG tablet Take 650-1,300 mg by mouth daily as needed. For pain   Yes Historical Provider, MD  bisoprolol-hydrochlorothiazide (ZIAC) 10-6.25 MG per tablet Take 1 tablet by mouth daily.   Yes Historical Provider, MD  EPINEPHrine (EPIPEN) 0.3 mg/0.3 mL DEVI Inject 0.3 mLs (0.3 mg total) into the muscle once. 05/26/12  Yes Ryan M Dunn, PA-C  ibuprofen (ADVIL,MOTRIN) 200 MG tablet Take 200-800 mg by mouth every 6 (six) hours as needed. For pain   Yes Historical Provider, MD  simvastatin (ZOCOR) 40 MG tablet Take 20 mg by mouth every evening.   Yes Historical Provider, MD   No Known Allergies  History  Substance Use Topics  . Smoking status: Never Smoker   . Smokeless tobacco: Not on file  . Alcohol Use: 1.2 oz/week    2 Cans of beer per week     Comment: rare 2 beers per month    History reviewed. No pertinent family history.   Review of Systems  Positive ROS: neg  All other systems have been reviewed and  were otherwise negative with the exception of those mentioned in the HPI and as above.  Objective: Vital signs in last 24 hours: Temp:  [98.1 F (36.7 C)] 98.1 F (36.7 C) (01/08 0659) Pulse Rate:  [57] 57  (01/08 0659) Resp:  [20] 20  (01/08 0659) BP: (148)/(89) 148/89 mmHg (01/08 0659) SpO2:  [99 %] 99 % (01/08 0659)  General Appearance: Alert, cooperative, no distress, appears stated age Head: Normocephalic, without obvious abnormality, atraumatic Eyes: PERRL, conjunctiva/corneas clear, EOM's intact, fundi benign, both eyes      Ears: Normal TM's and external ear canals, both ears Throat: Lips, mucosa, and tongue normal; teeth and gums normal Neck: Supple, symmetrical, trachea midline, no adenopathy; thyroid: No enlargement/tenderness/nodules; no carotid bruit or JVD Back: Symmetric, no curvature, ROM normal, no CVA tenderness Lungs: Clear to auscultation bilaterally, respirations unlabored Heart: Regular rate and rhythm, S1 and S2 normal, no murmur, rub or gallop Abdomen: Soft, non-tender, bowel sounds active all four quadrants, no masses, no organomegaly Extremities: Extremities normal, atraumatic, no cyanosis or edema Pulses: 2+ and symmetric all extremities Skin: Skin color, texture, turgor normal, no rashes or lesions  NEUROLOGIC:   Mental status: Alert and oriented x4,  no aphasia, good attention span, fund of knowledge, and memory Motor Exam - grossly normal Sensory Exam - grossly normal Reflexes:  1+ Coordination - grossly normal Gait - grossly normal Balance - grossly normal Cranial Nerves: I: smell Not tested  II: visual acuity  OS: nl    OD: nl  II: visual fields Full to confrontation  II: pupils Equal, round, reactive to light  III,VII: ptosis None  III,IV,VI: extraocular muscles  Full ROM  V: mastication Normal  V: facial light touch sensation  Normal  V,VII: corneal reflex  Present  VII: facial muscle function - upper  Normal  VII: facial muscle function  - lower Normal  VIII: hearing Not tested  IX: soft palate elevation  Normal  IX,X: gag reflex Present  XI: trapezius strength  5/5  XI: sternocleidomastoid strength 5/5  XI: neck flexion strength  5/5  XII: tongue strength  Normal    Data Review Lab Results  Component Value Date   WBC 10.8* 06/19/2012   HGB 15.6 06/19/2012   HCT 44.3 06/19/2012   MCV 90.6 06/19/2012   PLT 147* 06/19/2012   Lab Results  Component Value Date   NA 141 06/19/2012   K 4.0 06/19/2012   CL 105 06/19/2012   CO2 27 06/19/2012   BUN 18 06/19/2012   CREATININE 1.10 06/19/2012   GLUCOSE 95 06/19/2012   Lab Results  Component Value Date   INR 0.90 06/19/2012    Assessment/Plan: Patient admitted for DLL L4-5. Patient has failed conservative therapy.  I explained the condition and procedure to the patient and answered any questions.  Patient wishes to proceed with procedure as planned. Understands risks/ benefits and typical outcomes of procedure.   Tillmon Kisling S 06/27/2012 8:56 AM

## 2012-06-27 NOTE — Anesthesia Preprocedure Evaluation (Signed)
Anesthesia Evaluation    Airway Mallampati: I TM Distance: >3 FB Neck ROM: full    Dental   Pulmonary          Cardiovascular hypertension, Rhythm:regular Rate:Normal     Neuro/Psych    GI/Hepatic GERD-  ,(+) Hepatitis -  Endo/Other    Renal/GU      Musculoskeletal   Abdominal   Peds  Hematology   Anesthesia Other Findings   Reproductive/Obstetrics                           Anesthesia Physical Anesthesia Plan  ASA: III  Anesthesia Plan: General   Post-op Pain Management:    Induction: Intravenous  Airway Management Planned: Oral ETT  Additional Equipment:   Intra-op Plan:   Post-operative Plan: Extubation in OR  Informed Consent:   Plan Discussed with: CRNA, Anesthesiologist and Surgeon  Anesthesia Plan Comments:         Anesthesia Quick Evaluation

## 2012-06-27 NOTE — Op Note (Signed)
06/27/2012  10:33 AM  PATIENT:  [name]  72 y.o. male  PRE-OPERATIVE DIAGNOSIS:  Lumbar spinal stenosis L4-5  POST-OPERATIVE DIAGNOSIS:  same  PROCEDURE:  Decompressive bilateral lumbar hemilaminectomy, medial facetectomy, and foraminotomies L4-5 for central canal and lateral recess decompression for stenosis  SURGEON:  Marikay Alar, MD  ASSISTANTS: none  ANESTHESIA:   General  EBL: 50 ml  Total I/O In: 1200 [I.V.:1200] Out: -   BLOOD ADMINISTERED:none  DRAINS: None   SPECIMEN:  No Specimen  INDICATION FOR PROCEDURE: This patient presented with neurogenic claudication. He was found to have severe spinal stenosis on lumbar MRI. Recommended decompressive laminectomy L4-5. Patient understood the risks, benefits, and alternatives and potential outcomes and wished to proceed.  PROCEDURE DETAILS: The patient was taken to the operating room and after induction of adequate generalized endotracheal anesthesia, the patient was rolled into the prone position on the Wilson frame and all pressure points were padded. The lumbar region was cleaned and then prepped with DuraPrep and draped in the usual sterile fashion. 5 cc of local anesthesia was injected and then a dorsal midline incision was made and carried down to the lumbo sacral fascia. The fascia was opened and the paraspinous musculature was taken down in a subperiosteal fashion to expose L4-5 bilaterally. Intraoperative x-ray confirmed my level, and then I used a combination of the high-speed drill and the Kerrison punches to perform a hemilaminectomy, medial facetectomy, and foraminotomy at L4-5 bilaterally. The underlying yellow ligament was opened and removed in a piecemeal fashion to expose the underlying dura and exiting nerve root. The yellow ligament was significantly overgrown in the lateral recess bilaterally. I undercut the lateral recess and dissected down until I was medial to and distal to the pedicle. The nerve root was  well decompressed on both sides. We then gently retracted the nerve root medially with a retractor, coagulated the epidural venous vasculature, and inspected the disc space.. I then palpated with a coronary dilator along the nerve root and into the foramen to assure adequate decompression. I felt no more compression of the nerve root on either side. I irrigated with saline solution containing bacitracin. Achieved hemostasis with bipolar cautery, lined the dura with Gelfoam, and then closed the fascia with 0 Vicryl. I closed the subcutaneous tissues with 2-0 Vicryl and the subcuticular tissues with 3-0 Vicryl. The skin was then closed with benzoin and Steri-Strips. The drapes were removed, a sterile dressing was applied. The patient was awakened from general anesthesia and transferred to the recovery room in stable condition. At the end of the procedure all sponge, needle and instrument counts were correct.   PLAN OF CARE: Admit for overnight observation  PATIENT DISPOSITION:  PACU - hemodynamically stable.   Delay start of Pharmacological VTE agent (>24hrs) due to surgical blood loss or risk of bleeding:  yes

## 2012-06-28 ENCOUNTER — Encounter (HOSPITAL_COMMUNITY): Payer: Self-pay | Admitting: Neurological Surgery

## 2012-06-28 MED ORDER — SENNOSIDES-DOCUSATE SODIUM 8.6-50 MG PO TABS
1.0000 | ORAL_TABLET | Freq: Two times a day (BID) | ORAL | Status: DC
  Filled 2012-06-28 (×2): qty 1

## 2012-06-28 MED ORDER — SENNA 8.6 MG PO TABS
1.0000 | ORAL_TABLET | Freq: Every day | ORAL | Status: DC | PRN
  Administered 2012-06-28: 8.6 mg via ORAL
  Filled 2012-06-28: qty 1

## 2012-06-28 MED ORDER — ZOLPIDEM TARTRATE 5 MG PO TABS: 5.0000 mg | ORAL_TABLET | Freq: Every evening | ORAL | Status: DC | PRN

## 2012-06-28 NOTE — Progress Notes (Signed)
Patient ID: [name], male   DOB: Sep 24, 1940, 72 y.o.   MRN: [id number] Subjective: Patient reports he's doing well, not much pain. Foley placed for urinary retention.  Objective: Vital signs in last 24 hours: Temp:  [96.8 F (36 C)-98.6 F (37 C)] 98.2 F (36.8 C) (01/09 0736) Pulse Rate:  [59-90] 72  (01/09 0736) Resp:  [13-33] 18  (01/09 0736) BP: (108-172)/(57-83) 154/71 mmHg (01/09 0736) SpO2:  [96 %-100 %] 100 % (01/09 0736)  Intake/Output from previous day: 01/08 0701 - 01/09 0700 In: 2160 [P.O.:960; I.V.:1200] Out: 2400 [Urine:2400] Intake/Output this shift:    Neurologic: Grossly normal  Lab Results: Lab Results  Component Value Date   WBC 10.8* 06/19/2012   HGB 15.6 06/19/2012   HCT 44.3 06/19/2012   MCV 90.6 06/19/2012   PLT 147* 06/19/2012   Lab Results  Component Value Date   INR 0.90 06/19/2012   BMET Lab Results  Component Value Date   NA 141 06/19/2012   K 4.0 06/19/2012   CL 105 06/19/2012   CO2 27 06/19/2012   GLUCOSE 95 06/19/2012   BUN 18 06/19/2012   CREATININE 1.10 06/19/2012   CALCIUM 9.5 06/19/2012    Studies/Results: Dg Lumbar Spine 1 View  06/27/2012  *RADIOLOGY REPORT*  Clinical Data: L4-L5 laminectomy.  LUMBAR SPINE - 1 VIEW  Comparison: MRI of 02/08/2012  Findings: Single lateral intraoperative view labeled 0930 hours demonstrates a surgical device at the L4-L5 junction.  IMPRESSION: Intraoperative localization of L4-L5.   Original Report Authenticated By: Jeronimo Greaves, M.D.     Assessment/Plan: Doing well except for urinary retention. Will D/C foley for voiding trial later today or tomorrow.   LOS: 1 day    Anais Koenen S 06/28/2012, 7:57 AM

## 2012-06-29 NOTE — Progress Notes (Signed)
Patient ID: [name], male   DOB: 08/25/1940, 72 y.o.   MRN: [id number] Patient has continued urinary retention and had to have the Foley catheter replaced. Otherwise he is doing well with minimal pain and leg is an appropriate back soreness. Good strength. Ambulating ok. Likely home tomorrow with a leg bag.

## 2012-06-30 MED ORDER — HYDROCODONE-ACETAMINOPHEN 5-325 MG PO TABS: 1.0000 | ORAL_TABLET | ORAL | Status: DC | PRN

## 2012-06-30 MED ORDER — TAMSULOSIN HCL 0.4 MG PO CAPS: 0.4000 mg | ORAL_CAPSULE | Freq: Every day | ORAL | Status: DC

## 2012-06-30 NOTE — Discharge Summary (Signed)
Physician Discharge Summary  Patient ID: [name] MRN: [id number] DOB/AGE: [birth year] 73 y.o.  Admit date: 06/27/2012 Discharge date: 06/30/2012  Admission Diagnoses:  Lumbar stenosis  Discharge Diagnoses:  Lumbar stenosis, urinary retention  Discharged Condition: good  Hospital Course: Patient is under the care of Dr. Marikay Alar. He is admitted for treatment of lumbar stenosis. He underwent a decompressive lumbar laminectomy. He had difficulties with postoperative urinary retention. Foley catheter was placed, socially disc continued, but he failed a subsequent voiding trial, and the Foley catheters had been placed again. Patient explains that he is followed by Dr. Jethro Bolus for BPH. Dr. Yetta Barre has started the patient on Flomax and wants patient discharged home with a catheter, with subsequent followup by Dr. Patsi Sears. I've recommended to the patient that he see Dr. Patsi Sears in the next 3-4 days. The nursing staff is given the patient instructions regarding maintenance of his Foley catheter. Patient's been up and living actively in the halls. His wound is healing nicely. He has been given instructions regarding wound care and activities following discharge. He is to followup with Dr. Yetta Barre in 3 weeks.  Discharge Exam: Blood pressure 146/65, pulse 66, temperature 98.1 F (36.7 C), temperature source Oral, resp. rate 16, SpO2 98.00%.  Disposition: Home     Medication List     As of 06/30/2012  8:04 AM    TAKE these medications         acetaminophen 325 MG tablet   Commonly known as: TYLENOL   Take 650-1,300 mg by mouth daily as needed. For pain      bisoprolol-hydrochlorothiazide 10-6.25 MG per tablet   Commonly known as: ZIAC   Take 1 tablet by mouth daily.      EPINEPHrine 0.3 mg/0.3 mL Devi   Commonly known as: EPI-PEN   Inject 0.3 mLs (0.3 mg total) into the muscle once.      HYDROcodone-acetaminophen 5-325 MG per tablet   Commonly known as: NORCO/VICODIN     Take 1-2 tablets by mouth every 4 (four) hours as needed.      ibuprofen 200 MG tablet   Commonly known as: ADVIL,MOTRIN   Take 200-800 mg by mouth every 6 (six) hours as needed. For pain      simvastatin 40 MG tablet   Commonly known as: ZOCOR   Take 20 mg by mouth every evening.      Tamsulosin HCl 0.4 MG Caps   Commonly known as: FLOMAX   Take 1 capsule (0.4 mg total) by mouth daily.         SignedHewitt Shorts, MD 06/30/2012, 8:04 AM

## 2012-06-30 NOTE — Progress Notes (Signed)
Pt and wife given D/C instructions with Rx's. Pt and wife taught about Foley care and how to change to leg bag for home use. Both verbalized understanding of teaching. Pt D/C'd home via wheelchair with wife @ 1040 per MD order. Rema Fendt, RN

## 2012-09-07 ENCOUNTER — Other Ambulatory Visit: Payer: Self-pay | Admitting: Cardiology

## 2012-09-11 NOTE — H&P (Signed)
Patient: [name], [name] Account Number: [id number] Provider: Donato Schultz, [name]  DOB: [birth year] Age: 72 Y Sex: Male Date: 09/06/2012  Phone: [phone]   Address: [address], [place], [place]  Pcp: [name]          1. REF DR Mathews EVALUATE DYSPNEA ON EXERTION.        HPI:  General:  72 year old here for evaluation of dyspnea on exertion. Concern for possible coronary artery disease. Chest x-ray demonstrated normal lung fields except for lower lobe 7 mm nodule which is stable. Shortness of breath has been noted with walking short distances less than 2 blocks which seems to be concerning. This has been progressing over the past few months. He had back surgery 2 months ago..        ROS:  The other elements of the review of systems are negative (12 total elements).       Medical History: BPH with chronic prostatitis, Bilateral reactive lymph nodes on pelvic CT scan in 2009, DJD, Ocular migraines, 7 mm left lower lobe pulmonary nodule and a 3 mm right lower lobe pulmonary nodule -noted incidentally on CT scan and when working up his abdomen. Needs repeat chest x-ray in September, 2011 to reassess, chronic abdominal pain syndrome, with abdominal CT in September, 2009 and March, 2010 both normal. Colonoscopy in early April, 2010 revealed a small adenomatous polyp of the sigmoid colon, but was otherwise normal, Erectile dysfunction, intermittent symptoms of GERD, Hypertension, Hyperlipidemia, history of microscopic hematuria for many years. Recent workup in January 2010 per [name] - benign, elevated PSA with negative biopsies -[name] , prostate biopsy, August 03, 2009-[name], Nl stress thallium 06/2004, Asthma as adolescent, left naris epistaxis - 04/2011 - f/u [name], L4-5 spinal stenosis, [name], September, 2013 - [name], December, 2013, pain persists.        Surgical History: T & A , arthroscopic knee  surgery/left , appendectomy , wisdom teeth , MOHS x2, right and left scalp , prostatic biopsy x3, benign - [name] , Lumbar epidural steroids L4-5 - [name] Mathews, decompressive bilateral lumbar hemilaminectomy at L4-5 with foraminotomies and medial facetectomy - [name] Mathews2014.        Family History: Father: deceased 43 yrs, DementiaMother: deceased 73 yrs, Possible botulismSister 1: alive, Trauma induced seizuresSister 2: alive, A + W Significant GI family history: None.       Social History:  General:  History of smoking  cigarettes: Never smoked no Smoking.  Alcohol: yes, Rare.  no Recreational drug use.  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  Marital Status: Married twice, with two children from his first marriage.  Native of [place], grew up in [place], moved to [place] in 1976.       Medications: Taking Aspirin EC 81 MG Tablet Delayed Release 1 tablet Once a day, Taking Viagra 100 MG Tablet 1 tablet as needed prn, Taking Claritin 10 MG Tablet 1 tablet prn, Taking Pepcid AC 10 MG Tablet i po prn , Taking PredniSONE 10 MG Tablet as directed for allergy, Taking Zolpidem Tartrate 5 MG Tablet 1 tablet at bedtime for travel, Taking Flomax 0.4 MG Capsule 1 capsule 30 minutes after the same meal each day Once a day, Taking Benadryl 25 MG Tablet 2 tablets as needed, Taking EpiPen 1 MG/ML Device as directed , Taking  Ziac 10-6.25 MG Tablet 1 tablet Once a day, Taking Simvastatin 40 MG Tablet TAKE 1/2 TABLET BY MOUTH ONCE DAILY EVERY EVENING , Medication List reviewed and reconciled with the patient       Allergies: ACE inhibitors: cough.           Vitals: Wt declined, Pulse sitting 64, BP sitting 130/78.       Examination:  General Examination:  GENERAL APPEARANCE alert, oriented, NAD, pleasant.  SKIN: normal, no rash.  HEENT: normal.  HEAD: [place]/AT.  EYES: EOMI, Conjunctiva clear.   NECK: supple, FROM, without evidence of thyromegaly, adenopathy, or bruits, no jugular venous distention (JVD).  LUNGS: clear to auscultation bilaterally, no wheezes, rhonchi, rales, regular breathing rate and effort.  HEART: regular rate and rhythm, no S3, S4, murmur or rub, point of maximul impulse (PMI) normal.  ABDOMEN: soft, non-tender/non-distended, bowel sounds present, no masses palpated, no bruit.  EXTREMITIES: no clubbing, no edema, pulses 2 plus bilaterally.  NEUROLOGIC EXAM: non-focal exam, alert and oriented x 3.  PERIPHERAL PULSES: normal (2+) bilaterally.  LYMPH NODES: no cervical adenopathy.  PSYCH affect normal.  Prior medical records, lab work, EKG reviewed.           Assessment:  1. Observation for suspected cardiovascular disease - V71.7 (Primary)  2. Dyspnea on exertion - 786.09  3. Mixed hyperlipidemia - 272.2  4. Essential hypertension, benign - 401.1, Controlled        1. Observation for suspected cardiovascular disease  LAB: Basic Metabolic    GLUCOSE 89  70-99 - mg/dL   BUN 17  1-61 - mg/dL   CREATININE 0.96  0.45-4.09 - mg/dl   eGFR (NON-AFRICAN AMERICAN) 59 L >60 - calc   eGFR (AFRICAN AMERICAN) 71  >60 - calc   SODIUM 140  136-145 - mmol/L   POTASSIUM 4.2  3.5-5.5 - mmol/L   CHLORIDE 104  98-107 - mmol/L   C02 31  22-32 - mmol/L   ANION GAP 8.3  6.0-20.0 - mmol/L   CALCIUM 9.8  8.6-10.3 - mg/dL   LAB: PT (Prothrombin Time) (811914)    Prothrombin Time 11.0  9.1-12.0 - SEC   INR 1.0  0.8-1.2 -    LAB: CBC with Diff    WBC 12.5 H 4.0-11.0 - K/ul   RBC 5.06  4.20-5.80 - M/uL   HGB 15.4  13.0-17.0 - g/dL   HCT 78.2  95.6-21.3 - %   MCH 30.4  27.0-33.0 - pg   MPV 12.1 H 7.5-10.7 - fL   MCV 91.5  80.0-94.0 - fL   MCHC 33.2  32.0-36.0 - g/dL   RDW 08.6  57.8-46.9 - %   NRBC# 0.04  -    PLT 183  150-400 - K/uL   NEUT % 60.5  43.3-71.9 - %   NRBC% 0.30  - %   LYMPH% 30.2  16.8-43.5 - %   MONO % 5.9  4.6-12.4 - %   EOS % 2.8  0.0-7.8 - %    BASO % 0.6  0.0-1.0 - %   NEUT # 7.6 H 1.9-7.2 - K/uL   LYMPH# 3.80 H 1.10-2.70 - K/uL   MONO # 0.7  0.3-0.8 - K/uL   EOS # 0.4  0.0-0.6 - K/uL   BASO # 0.1  0.0-0.1 - K/uL      2. Dyspnea on exertion  Notes: Prior stress test with normal perfusion but abnormal ETT (downsloping ST changes). With increased SOB, proceeding with cath. Risks and benefits  of cardiac catheterization have been reviewed including risk of stroke, heart attack, death, bleeding, renal impariment and arterial damage. There was ample oppurtuny to answer questions. Alternatives were discussed. Patient understands and wishes to proceed. JV radial.        Procedures:  Venipuncture:  Venipuncture: Smith,Michele 09/06/2012 04:07:40 PM > , performed in right arm.           Procedure Codes: 16109 ECL BMP, 85025 ECL CBC PLATELET DIFF, 60454 BLOOD COLLECTION ROUTINE VENIPUNCTURE       Follow Up: post cath        Care Plan Details:   Provider: Donato Schultz, [name]  Patient: Durante, Violett DOB: 05/15/1941 Date: 09/06/2012

## 2012-09-12 ENCOUNTER — Encounter (HOSPITAL_BASED_OUTPATIENT_CLINIC_OR_DEPARTMENT_OTHER)
Admission: RE | Disposition: A | Discharge status: Home / Self Care | Payer: Self-pay | Source: Ambulatory Visit | Attending: Cardiology

## 2012-09-12 SURGERY — JV LEFT HEART CATHETERIZATION WITH CORONARY ANGIOGRAM
Anesthesia: Moderate Sedation

## 2012-09-18 ENCOUNTER — Other Ambulatory Visit: Payer: Self-pay | Admitting: Cardiology

## 2012-09-20 ENCOUNTER — Inpatient Hospital Stay (HOSPITAL_BASED_OUTPATIENT_CLINIC_OR_DEPARTMENT_OTHER)
Admission: RE | Admit: 2012-09-20 | Discharge: 2012-09-20 | Disposition: A | Discharge status: Home / Self Care | Payer: Medicare Other | Source: Ambulatory Visit | Attending: Cardiology | Admitting: Cardiology

## 2012-09-20 ENCOUNTER — Inpatient Hospital Stay (HOSPITAL_BASED_OUTPATIENT_CLINIC_OR_DEPARTMENT_OTHER): Admission: RE | Admit: 2012-09-20 | Payer: Medicare Other | Source: Ambulatory Visit | Admitting: Cardiology

## 2012-09-20 ENCOUNTER — Encounter (HOSPITAL_BASED_OUTPATIENT_CLINIC_OR_DEPARTMENT_OTHER)
Admission: RE | Disposition: A | Discharge status: Home / Self Care | Payer: Self-pay | Source: Ambulatory Visit | Attending: Cardiology

## 2012-09-20 DIAGNOSIS — R0989 Other specified symptoms and signs involving the circulatory and respiratory systems: Secondary | ICD-10-CM | POA: Insufficient documentation

## 2012-09-20 DIAGNOSIS — I2510 Atherosclerotic heart disease of native coronary artery without angina pectoris: Secondary | ICD-10-CM | POA: Insufficient documentation

## 2012-09-20 DIAGNOSIS — I10 Essential (primary) hypertension: Secondary | ICD-10-CM | POA: Insufficient documentation

## 2012-09-20 DIAGNOSIS — E782 Mixed hyperlipidemia: Secondary | ICD-10-CM | POA: Insufficient documentation

## 2012-09-20 DIAGNOSIS — R9439 Abnormal result of other cardiovascular function study: Secondary | ICD-10-CM

## 2012-09-20 DIAGNOSIS — R0600 Dyspnea, unspecified: Secondary | ICD-10-CM

## 2012-09-20 DIAGNOSIS — R0609 Other forms of dyspnea: Secondary | ICD-10-CM | POA: Insufficient documentation

## 2012-09-20 HISTORY — DX: Dyspnea, unspecified: R06.00

## 2012-09-20 SURGERY — JV LEFT HEART CATHETERIZATION WITH CORONARY ANGIOGRAM
Anesthesia: Moderate Sedation

## 2012-09-20 MED ORDER — SODIUM CHLORIDE 0.9 % IV SOLN: INTRAVENOUS | Status: DC

## 2012-09-20 MED ORDER — ASPIRIN 81 MG PO CHEW
324.0000 mg | CHEWABLE_TABLET | ORAL | Status: AC
  Administered 2012-09-20: 324 mg via ORAL

## 2012-09-20 MED ORDER — ACETAMINOPHEN 325 MG PO TABS: 650.0000 mg | ORAL_TABLET | ORAL | Status: DC | PRN

## 2012-09-20 MED ORDER — SODIUM CHLORIDE 0.9 % IJ SOLN: 3.0000 mL | INTRAMUSCULAR | Status: DC | PRN

## 2012-09-20 MED ORDER — ONDANSETRON HCL 4 MG/2ML IJ SOLN: 4.0000 mg | Freq: Four times a day (QID) | INTRAMUSCULAR | Status: DC | PRN

## 2012-09-20 MED ORDER — SODIUM CHLORIDE 0.9 % IJ SOLN: 3.0000 mL | Freq: Two times a day (BID) | INTRAMUSCULAR | Status: DC

## 2012-09-20 MED ORDER — DIAZEPAM 5 MG PO TABS
5.0000 mg | ORAL_TABLET | ORAL | Status: AC
  Administered 2012-09-20: 5 mg via ORAL

## 2012-09-20 MED ORDER — SODIUM CHLORIDE 0.9 % IV SOLN: 250.0000 mL | INTRAVENOUS | Status: DC | PRN

## 2012-09-20 NOTE — OR Nursing (Signed)
Meal served 

## 2012-09-20 NOTE — OR Nursing (Signed)
+  Allen's test right hand 

## 2012-09-20 NOTE — H&P (View-Only) (Signed)
  Patient: [name], [name] Account Number: [id number] Provider: Yahira Timberman, MD  DOB: 08/22/1940 Age: 72 Y Sex: Male Date: 09/06/2012  Phone: [phone]   Address: [address], [place], [place]  Pcp: ROBERT N GATES          1. REF DR GATES EVALUATE DYSPNEA ON EXERTION.        HPI:  General:  72-year-old here for evaluation of dyspnea on exertion. Concern for possible coronary artery disease. Chest x-ray demonstrated normal lung fields except for lower lobe 7 mm nodule which is stable. Shortness of breath has been noted with walking short distances less than 2 blocks which seems to be concerning. This has been progressing over the past few months. He had back surgery 2 months ago..        ROS:  The other elements of the review of systems are negative (12 total elements).       Medical History: BPH with chronic prostatitis, Bilateral reactive lymph nodes on pelvic CT scan in 2009, DJD, Ocular migraines, 7 mm left lower lobe pulmonary nodule and a 3 mm right lower lobe pulmonary nodule -noted incidentally on CT scan and when working up his abdomen. Needs repeat chest x-ray in September, 2011 to reassess, chronic abdominal pain syndrome, with abdominal CT in September, 2009 and March, 2010 both normal. Colonoscopy in early April, 2010 revealed a small adenomatous polyp of the sigmoid colon, but was otherwise normal, Erectile dysfunction, intermittent symptoms of GERD, Hypertension, Hyperlipidemia, history of microscopic hematuria for many years. Recent workup in January 2010 per Dr. Sigmund Tannenbaum - benign, elevated PSA with negative biopsies -Dr. Sigmund Tannenbaum , prostate biopsy, August 03, 2009-Dr. Sigmund Tannenbaum, Nl stress thallium 06/2004, Asthma as adolescent, left naris epistaxis - 04/2011 - f/u Dr.Jeff Rosen, L4-5 spinal stenosis, Dr. David Jones, September, 2013 - Dr. Paul Harkins, December, 2013, pain persists.        Surgical History: T & A , arthroscopic knee  surgery/left , appendectomy , wisdom teeth , MOHS x2, right and left scalp , prostatic biopsy x3, benign - Dr. Sig Tannenbaum , Lumbar epidural steroids L4-5 - Dr. Paul Harkins 03/2012, decompressive bilateral lumbar hemilaminectomy at L4-5 with foraminotomies and medial facetectomy - Dr. David Jones 06/27/2012.        Family History: Father: deceased 96 yrs, DementiaMother: deceased 48 yrs, Possible botulismSister 1: alive, Trauma induced seizuresSister 2: alive, A + W Significant GI family history: None.       Social History:  General:  History of smoking  cigarettes: Never smoked no Smoking.  Alcohol: yes, Rare.  no Recreational drug use.  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  Marital Status: Married twice, with two children from his first marriage.  Native of [place], grew up in [place], moved to [place] in 1976.       Medications: Taking Aspirin EC 81 MG Tablet Delayed Release 1 tablet Once a day, Taking Viagra 100 MG Tablet 1 tablet as needed prn, Taking Claritin 10 MG Tablet 1 tablet prn, Taking Pepcid AC 10 MG Tablet i po prn , Taking PredniSONE 10 MG Tablet as directed for allergy, Taking Zolpidem Tartrate 5 MG Tablet 1 tablet at bedtime for travel, Taking Flomax 0.4 MG Capsule 1 capsule 30 minutes after the same meal each day Once a day, Taking Benadryl 25 MG Tablet 2 tablets as needed, Taking EpiPen 1 MG/ML Device as directed , Taking   Ziac 10-6.25 MG Tablet 1 tablet Once a day, Taking Simvastatin 40 MG Tablet TAKE 1/2 TABLET BY MOUTH ONCE DAILY EVERY EVENING , Medication List reviewed and reconciled with the patient       Allergies: ACE inhibitors: cough.           Vitals: Wt declined, Pulse sitting 64, BP sitting 130/78.       Examination:  General Examination:  GENERAL APPEARANCE alert, oriented, NAD, pleasant.  SKIN: normal, no rash.  HEENT: normal.  HEAD: [place]/AT.  EYES: EOMI, Conjunctiva clear.   NECK: supple, FROM, without evidence of thyromegaly, adenopathy, or bruits, no jugular venous distention (JVD).  LUNGS: clear to auscultation bilaterally, no wheezes, rhonchi, rales, regular breathing rate and effort.  HEART: regular rate and rhythm, no S3, S4, murmur or rub, point of maximul impulse (PMI) normal.  ABDOMEN: soft, non-tender/non-distended, bowel sounds present, no masses palpated, no bruit.  EXTREMITIES: no clubbing, no edema, pulses 2 plus bilaterally.  NEUROLOGIC EXAM: non-focal exam, alert and oriented x 3.  PERIPHERAL PULSES: normal (2+) bilaterally.  LYMPH NODES: no cervical adenopathy.  PSYCH affect normal.  Prior medical records, lab work, EKG reviewed.           Assessment:  1. Observation for suspected cardiovascular disease - V71.7 (Primary)  2. Dyspnea on exertion - 786.09  3. Mixed hyperlipidemia - 272.2  4. Essential hypertension, benign - 401.1, Controlled        1. Observation for suspected cardiovascular disease  LAB: Basic Metabolic LAB: PT (Prothrombin Time) (005199) LAB: CBC with Diff      2. Dyspnea on exertion  Notes: Prior stress test with normal perfusion but abnormal ETT (downsloping ST changes). With increased SOB, proceeding with cath. Risks and benefits of cardiac catheterization have been reviewed including risk of stroke, heart attack, death, bleeding, renal impariment and arterial damage. There was ample oppurtuny to answer questions. Alternatives were discussed. Patient understands and wishes to proceed. JV radial.        Procedures:  Venipuncture:  Venipuncture: Smith,Michele 09/06/2012 04:07:40 PM > , performed in right arm.           Labs:    Lab: Basic Metabolic Creat 1.21, K 4.2  GLUCOSE 89  70-99 - mg/dL  BUN 17  6-26 - mg/dL  CREATININE 1.21  0.60-1.30 - mg/dl  eGFR (NON-AFRICAN AMERICAN) 59 L >60 - calc  eGFR (AFRICAN AMERICAN) 71  >60 - calc  SODIUM 140  136-145 - mmol/L  POTASSIUM 4.2  3.5-5.5 - mmol/L   CHLORIDE 104  98-107 - mmol/L  C02 31  22-32 - mmol/L  ANION GAP 8.3  6.0-20.0 - mmol/L  CALCIUM 9.8  8.6-10.3 - mg/dL   Tarry Blayney 09/10/2012 06:38:43 AM > ok for cath        Lab: PT (Prothrombin Time) (005199) normal  Prothrombin Time 11.0  9.1-12.0 - SEC  INR 1.0  0.8-1.2 -    Yashar Inclan 09/10/2012 06:38:43 AM > ok for cath        Lab: CBC with Diff Hg 15.4  WBC 12.5 H 4.0-11.0 - K/ul  RBC 5.06  4.20-5.80 - Mathews/uL  HGB 15.4  13.0-17.0 - g/dL  HCT 46.3  39.0-52.0 - %  MCH 30.4  27.0-33.0 - pg  MPV 12.1 H 7.5-10.7 - fL  MCV 91.5  80.0-94.0 - fL  MCHC 33.2  32.0-36.0 - g/dL  RDW 12.9  11.5-15.5 - %  NRBC# 0.04  -   PLT 183  150-400 - K/uL    NEUT % 60.5  43.3-71.9 - %  NRBC% 0.30  - %  LYMPH% 30.2  16.8-43.5 - %  MONO % 5.9  4.6-12.4 - %  EOS % 2.8  0.0-7.8 - %  BASO % 0.6  0.0-1.0 - %  NEUT # 7.6 H 1.9-7.2 - K/uL  LYMPH# 3.80 H 1.10-2.70 - K/uL  MONO # 0.7  0.3-0.8 - K/uL  EOS # 0.4  0.0-0.6 - K/uL  BASO # 0.1  0.0-0.1 - K/uL   Raider Valbuena 09/10/2012 06:38:43 AM > ok for cath          Procedure Codes: 80048 ECL BMP, 85025 ECL CBC PLATELET DIFF, 36415 BLOOD COLLECTION ROUTINE VENIPUNCTURE       Follow Up: post cath      Provider: Meriam Chojnowski, MD  Patient: Fahl, Avyon Mathews DOB: 03/19/1941 Date: 09/06/2012       

## 2012-09-20 NOTE — Interval H&P Note (Signed)
History and Physical Interval Note:  09/20/2012 8:38 AM  [name]  has presented today for surgery, with the diagnosis of cp  The various methods of treatment have been discussed with the patient and family. After consideration of risks, benefits and other options for treatment, the patient has consented to  Procedure(s): JV LEFT HEART CATHETERIZATION WITH CORONARY ANGIOGRAM (N/A) as a surgical intervention .  The patient's history has been reviewed, patient examined, no change in status, stable for surgery.  I have reviewed the patient's chart and labs.  Questions were answered to the patient's satisfaction.     Kristene Liberati

## 2012-09-20 NOTE — CV Procedure (Addendum)
CARDIAC CATHETERIZATION  PROCEDURE:  Left heart catheterization with selective coronary angiography, left ventriculogram via the radial artery approach.  INDICATIONS:  72 year old with hypertension, hyperlipidemia who has been having worsening dyspnea on exertion, shortness of breath walking short distances less than 2 blocks which seems concerning to him. This seems to be progressing. He has had back surgery in the past 2-3 months. Approximately 2 years ago he underwent nuclear stress test which showed no perfusion defect however he had downsloping ST segment depression. Because of his ongoing symptoms, I perform this catheterization to exclude the possibility of balanced ischemia/triple-vessel disease.  The risks, benefits, and details of the procedure were explained to the patient, including possibilities of stroke, heart attack, death, renal impairment, arterial damage, bleeding.  The patient verbalized understanding and wanted to proceed.  Informed written consent was obtained.  PROCEDURE TECHNIQUE:  Allen's test was performed pre-and post procedure and was normal. The right radial artery site was prepped and draped in a sterile fashion. One percent lidocaine was used for local anesthesia. Using the modified Seldinger technique a 5 French hydrophilic sheath was inserted into the radial artery without difficulty. 3 mg of verapamil was administered via the sheath. A Judkins right #4 catheter with the guidance of a Versicore wire was placed in the right coronary cusp and selectively cannulated the right coronary artery. After traversing the aortic arch, 4300 units of heparin IV was administered. A Judkins left #3.5 catheter was used to selectively cannulate the left main artery. Multiple views with hand injection of Omnipaque were obtained. 200 mcg of intracoronary nitroglycerin were administered into the left coronary system. Catheter a pigtail catheter was used to cross into the left ventricle,  hemodynamics were obtained, and a left ventriculogram was performed in the RAO position with power injection. Following the procedure, sheath was removed, patient was hemodynamically stable, hemostasis was maintained with a Terumo T band.   CONTRAST:  Total of 65 ml.    FLOUROSCOPY TIME: 3.7 min.  COMPLICATIONS:  None.    HEMODYNAMICS:  Aortic pressure was 99/60/38mmHg; LV systolic pressure was ; LVEDP .  There was no gradient between the left ventricle and aorta.    ANGIOGRAPHIC DATA:    Left main: Widely patent vessel giving rise to the LAD, circumflex and ramus/high obtuse marginal branch.  Left anterior descending (LAD): There is a focal mid LAD lesion of approximately 50-70% after the bifurcation of a large diagonal branch and moderate size septal branch that continue to be apparent after administration of nitroglycerin. It appears moderate in severity. The LAD then continues to the apex.  Circumflex artery (CIRC): There is a high obtuse marginal/ramus branch. At the ostium there is approximately 50% stenosis best seen in the straight caudal view. The remainder of the obtuse marginal system is patent. There are 2 other obtuse marginal branches.  Right coronary artery (RCA): The right coronary gives rise to the posterior descending artery. Distally/in the posterior lateral as well as posterior descending artery segment, the vessel becomes quite narrow, small in caliber however there are no flow limiting stenosis present. Distal RCA there is approximately 20% stenosis.  LEFT VENTRICULOGRAM:  Left ventricular angiogram was done in the 30 RAO projection and revealed normal left ventricular wall motion and systolic function with an estimated ejection fraction of 65%    IMPRESSIONS:  Focal mid LAD stenosis of moderate severity between 50 and 70%. Ostial ramus of approximately 50%. Small caliber posterior descending artery and posterior lateral artery, possible vaso-reactive.  Initial  angiogram of left coronary system demonstrated quite significant small caliber vessels diffusely. These seem to improve with administration of intracoronary nitroglycerin. Normal left ventricular systolic function.  LVEDP 14 mmHg.  Ejection fraction 65%.  RECOMMENDATION:  Medical management with isosorbide for possible vaso-reactivity. He cannot use Viagra with long-acting nitrates. I will also continue low-dose beta blocker, ziac. Hopefully these 2 medications will help with overall symptoms. If symptoms worsen or become more worrisome, further evaluation with flow wire may be useful to evaluate the severity of the LAD lesion.

## 2012-09-20 NOTE — H&P (Signed)
Patient: Akshath, Mccarey Account Number: [id number] Provider: Donato Schultz, MD  DOB: [birth year] Age: 72 Y Sex: Male Date: 09/06/2012  Phone: [phone]   Address: [address], [place], [place]  Pcp: ROBERT N GATES          1. REF DR GATES EVALUATE DYSPNEA ON EXERTION.        HPI:  General:  73 year old here for evaluation of dyspnea on exertion. Concern for possible coronary artery disease. Chest x-ray demonstrated normal lung fields except for lower lobe 7 mm nodule which is stable. Shortness of breath has been noted with walking short distances less than 2 blocks which seems to be concerning. This has been progressing over the past few months. He had back surgery 2 months ago..        ROS:  The other elements of the review of systems are negative (12 total elements).       Medical History: BPH with chronic prostatitis, Bilateral reactive lymph nodes on pelvic CT scan in 2009, DJD, Ocular migraines, 7 mm left lower lobe pulmonary nodule and a 3 mm right lower lobe pulmonary nodule -noted incidentally on CT scan and when working up his abdomen. Needs repeat chest x-ray in September, 2011 to reassess, chronic abdominal pain syndrome, with abdominal CT in September, 2009 and March, 2010 both normal. Colonoscopy in early April, 2010 revealed a small adenomatous polyp of the sigmoid colon, but was otherwise normal, Erectile dysfunction, intermittent symptoms of GERD, Hypertension, Hyperlipidemia, history of microscopic hematuria for many years. Recent workup in January 2010 per Dr. Jethro Bolus - benign, elevated PSA with negative biopsies -Dr. Jethro Bolus , prostate biopsy, August 03, 2009-Dr. Jethro Bolus, Nl stress thallium 06/2004, Asthma as adolescent, left naris epistaxis - 04/2011 - f/u Dr.Jeff Pollyann Kennedy, L4-5 spinal stenosis, Dr. Marikay Alar, September, 2013 - Dr. Odette Fraction, December, 2013, pain persists.        Surgical History: T & A , arthroscopic knee  surgery/left , appendectomy , wisdom teeth , MOHS x2, right and left scalp , prostatic biopsy x3, benign - Dr. Alexis Frock , Lumbar epidural steroids L4-5 - Dr. Odette Fraction 03/2012, decompressive bilateral lumbar hemilaminectomy at L4-5 with foraminotomies and medial facetectomy - Dr. Marikay Alar 06/27/2012.        Family History: Father: deceased 86 yrs, DementiaMother: deceased 45 yrs, Possible botulismSister 1: alive, Trauma induced seizuresSister 2: alive, A + W Significant GI family history: None.       Social History:  General:  History of smoking  cigarettes: Never smoked no Smoking.  Alcohol: yes, Rare.  no Recreational drug use.  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  Marital Status: Married twice, with two children from his first marriage.  Native of [place], grew up in [place], moved to [place] in 1976.       Medications: Taking Aspirin EC 81 MG Tablet Delayed Release 1 tablet Once a day, Taking Viagra 100 MG Tablet 1 tablet as needed prn, Taking Claritin 10 MG Tablet 1 tablet prn, Taking Pepcid AC 10 MG Tablet i po prn , Taking PredniSONE 10 MG Tablet as directed for allergy, Taking Zolpidem Tartrate 5 MG Tablet 1 tablet at bedtime for travel, Taking Flomax 0.4 MG Capsule 1 capsule 30 minutes after the same meal each day Once a day, Taking Benadryl 25 MG Tablet 2 tablets as needed, Taking EpiPen 1 MG/ML Device as directed , Taking  Ziac 10-6.25 MG Tablet 1 tablet Once a day, Taking Simvastatin 40 MG Tablet TAKE 1/2 TABLET BY MOUTH ONCE DAILY EVERY EVENING , Medication List reviewed and reconciled with the patient       Allergies: ACE inhibitors: cough.           Vitals: Wt declined, Pulse sitting 64, BP sitting 130/78.       Examination:  General Examination:  GENERAL APPEARANCE alert, oriented, NAD, pleasant.  SKIN: normal, no rash.  HEENT: normal.  HEAD: [place]/AT.  EYES: EOMI, Conjunctiva clear.   NECK: supple, FROM, without evidence of thyromegaly, adenopathy, or bruits, no jugular venous distention (JVD).  LUNGS: clear to auscultation bilaterally, no wheezes, rhonchi, rales, regular breathing rate and effort.  HEART: regular rate and rhythm, no S3, S4, murmur or rub, point of maximul impulse (PMI) normal.  ABDOMEN: soft, non-tender/non-distended, bowel sounds present, no masses palpated, no bruit.  EXTREMITIES: no clubbing, no edema, pulses 2 plus bilaterally.  NEUROLOGIC EXAM: non-focal exam, alert and oriented x 3.  PERIPHERAL PULSES: normal (2+) bilaterally.  LYMPH NODES: no cervical adenopathy.  PSYCH affect normal.  Prior medical records, lab work, EKG reviewed.           Assessment:  1. Observation for suspected cardiovascular disease - V71.7 (Primary)  2. Dyspnea on exertion - 786.09  3. Mixed hyperlipidemia - 272.2  4. Essential hypertension, benign - 401.1, Controlled        1. Observation for suspected cardiovascular disease  LAB: Basic Metabolic LAB: PT (Prothrombin Time) (161096) LAB: CBC with Diff      2. Dyspnea on exertion  Notes: Prior stress test with normal perfusion but abnormal ETT (downsloping ST changes). With increased SOB, proceeding with cath. Risks and benefits of cardiac catheterization have been reviewed including risk of stroke, heart attack, death, bleeding, renal impariment and arterial damage. There was ample oppurtuny to answer questions. Alternatives were discussed. Patient understands and wishes to proceed. JV radial.        Procedures:  Venipuncture:  Venipuncture: Smith,Michele 09/06/2012 04:07:40 PM > , performed in right arm.           Labs:    Lab: Basic Metabolic Creat 1.21, K 4.2  GLUCOSE 89  70-99 - mg/dL  BUN 17  0-45 - mg/dL  CREATININE 4.09  8.11-9.14 - mg/dl  eGFR (NON-AFRICAN AMERICAN) 59 L >60 - calc  eGFR (AFRICAN AMERICAN) 71  >60 - calc  SODIUM 140  136-145 - mmol/L  POTASSIUM 4.2  3.5-5.5 - mmol/L   CHLORIDE 104  98-107 - mmol/L  C02 31  22-32 - mmol/L  ANION GAP 8.3  6.0-20.0 - mmol/L  CALCIUM 9.8  8.6-10.3 - mg/dL   [facility] 78/29/5621 06:38:43 AM > ok for cath        Lab: PT (Prothrombin Time) (308657) normal  Prothrombin Time 11.0  9.1-12.0 - SEC  INR 1.0  0.8-1.2 -    Arminda Foglio 09/10/2012 06:38:43 AM > ok for cath        Lab: CBC with Diff Hg 15.4  WBC 12.5 H 4.0-11.0 - K/ul  RBC 5.06  4.20-5.80 - M/uL  HGB 15.4  13.0-17.0 - g/dL  HCT 84.6  96.2-95.2 - %  MCH 30.4  27.0-33.0 - pg  MPV 12.1 H 7.5-10.7 - fL  MCV 91.5  80.0-94.0 - fL  MCHC 33.2  32.0-36.0 - g/dL  RDW 84.1  32.4-40.1 - %  NRBC# 0.04  -   PLT 183  150-400 - K/uL  NEUT % 60.5  43.3-71.9 - %  NRBC% 0.30  - %  LYMPH% 30.2  16.8-43.5 - %  MONO % 5.9  4.6-12.4 - %  EOS % 2.8  0.0-7.8 - %  BASO % 0.6  0.0-1.0 - %  NEUT # 7.6 H 1.9-7.2 - K/uL  LYMPH# 3.80 H 1.10-2.70 - K/uL  MONO # 0.7  0.3-0.8 - K/uL  EOS # 0.4  0.0-0.6 - K/uL  BASO # 0.1  0.0-0.1 - K/uL   [facility] 09/10/2012 06:38:43 AM > ok for cath          Procedure Codes: [address] ECL BMP, 85025 ECL CBC PLATELET DIFF, 60454 BLOOD COLLECTION ROUTINE VENIPUNCTURE       Follow Up: post cath      Provider: Donato Schultz, MD  Patient: Savien, Mamula DOB: 1941/05/25 Date: 09/06/2012

## 2013-02-28 ENCOUNTER — Ambulatory Visit (INDEPENDENT_AMBULATORY_CARE_PROVIDER_SITE_OTHER): Payer: Medicare Other | Admitting: Family Medicine

## 2013-02-28 VITALS — BP 126/72 | HR 58 | Temp 97.8°F | Resp 18 | Ht 64.0 in | Wt 191.0 lb

## 2013-02-28 DIAGNOSIS — L0291 Cutaneous abscess, unspecified: Secondary | ICD-10-CM

## 2013-02-28 DIAGNOSIS — B353 Tinea pedis: Secondary | ICD-10-CM

## 2013-02-28 DIAGNOSIS — L0390 Cellulitis, unspecified: Secondary | ICD-10-CM

## 2013-02-28 MED ORDER — CEPHALEXIN 500 MG PO CAPS: 500.0000 mg | ORAL_CAPSULE | Freq: Three times a day (TID) | ORAL | Status: DC

## 2013-02-28 MED ORDER — SULFAMETHOXAZOLE-TMP DS 800-160 MG PO TABS: 1.0000 | ORAL_TABLET | Freq: Two times a day (BID) | ORAL | Status: DC

## 2013-02-28 NOTE — Patient Instructions (Signed)
Return tomorrow for a recheck. Cellulitis Cellulitis is an infection of the skin and the tissue beneath it. The infected area is usually red and tender. Cellulitis occurs most often in the arms and lower legs.  CAUSES  Cellulitis is caused by bacteria that enter the skin through cracks or cuts in the skin. The most common types of bacteria that cause cellulitis are Staphylococcus and Streptococcus. SYMPTOMS   Redness and warmth.  Swelling.  Tenderness or pain.  Fever. DIAGNOSIS  Your caregiver can usually determine what is wrong based on a physical exam. Blood tests may also be done. TREATMENT  Treatment usually involves taking an antibiotic medicine. HOME CARE INSTRUCTIONS   Take your antibiotics as directed. Finish them even if you start to feel better.  Keep the infected arm or leg elevated to reduce swelling.  Apply a warm cloth to the affected area up to 4 times per day to relieve pain.  Only take over-the-counter or prescription medicines for pain, discomfort, or fever as directed by your caregiver.  Keep all follow-up appointments as directed by your caregiver. SEEK MEDICAL CARE IF:   You notice red streaks coming from the infected area.  Your red area gets larger or turns dark in color.  Your bone or joint underneath the infected area becomes painful after the skin has healed.  Your infection returns in the same area or another area.  You notice a swollen bump in the infected area.  You develop new symptoms. SEEK IMMEDIATE MEDICAL CARE IF:   You have a fever.  You feel very sleepy.  You develop vomiting or diarrhea.  You have a general ill feeling (malaise) with muscle aches and pains. MAKE SURE YOU:   Understand these instructions.  Will watch your condition.  Will get help right away if you are not doing well or get worse. Document Released: 03/16/2005 Document Revised: 12/06/2011 Document Reviewed: 08/22/2011 [facility] Patient Information 2014  [place], [place].

## 2013-02-28 NOTE — Progress Notes (Signed)
History and physical exam obtained with Deboraha Sprang.  R foot with draining blister/vesicle 8 mm diameter along lateral-posterior aspect of 4th digit on R foot.  +erythema diffusely interdigit space of 4th, 5th digits.  +erythema extends up foot.  Warmth to palpation; erythema; +minimally indurated; no fluctuants.  Drainage is yellow clear and from vesicle on 4th digit. A/P:  R foot cellulitis from tinea pedis:  Rx for Keflex, Septra provided; f/u 24 hours; continue Lotrimin.

## 2013-02-28 NOTE — Progress Notes (Signed)
  Subjective:    Patient ID: [name], male    DOB: 07/15/1940, 72 y.o.   MRN: [id number]  HPI This 72 yo male presents today with right foot pain and redness. Patient noticed redness started 1 week ago and thought it was athlete's foot related to water aerobics which he started several weeks ago. He has not injured the foot. He describes the pain as "not horrible," goes through foot at base of 3rd, 4th toes all the way through to sole. The pain started in last couple of days. He started limping due to pain yesterday. It hurt him to sleep on his stomach last night so he took 2 tylenol and an Ambien (which he takes a couple of times a year) and was able to sleep. He applied Lotremin to the area yesterday, but the redness and pain spread.  No fever, chills, rigors.   Review of Systems     Objective:   Physical Exam WD/WN male in NAD. Right foot with redness at base of 3-4 toes, extends approx. 6-7 cm up foot. Area between toes with cracking, redness, after separating toes, moderate amount of serous drainage appeared. Mildly tender with manipulation. PT/DP pulses present. Good cap refill.    Assessment & Plan:  Cellulitis  Tinea pedis  Meds ordered this encounter  Medications  . cephALEXin (KEFLEX) 500 MG capsule    Sig: Take 1 capsule (500 mg total) by mouth 3 (three) times daily.    Dispense:  30 capsule    Refill:  0  . sulfamethoxazole-trimethoprim (BACTRIM DS) 800-160 MG per tablet    Sig: Take 1 tablet by mouth 2 (two) times daily.    Dispense:  20 tablet    Refill:  0    1- Cellulitis/tinea pedis- antibiotics as ordered.Continue Lotrimin 2-3 times a day.  No water aerobics  for 1 week. 2-Return for recheck with Dr. Katrinka Blazing tomorrow morning- fast pass provided 3-Written and oral instructions given regarding diagnoses and treatment.

## 2013-03-01 ENCOUNTER — Ambulatory Visit (INDEPENDENT_AMBULATORY_CARE_PROVIDER_SITE_OTHER): Payer: Medicare Other | Admitting: Family Medicine

## 2013-03-01 VITALS — BP 142/80 | HR 55 | Temp 98.9°F | Resp 18

## 2013-03-01 DIAGNOSIS — L03115 Cellulitis of right lower limb: Secondary | ICD-10-CM

## 2013-03-01 DIAGNOSIS — L02619 Cutaneous abscess of unspecified foot: Secondary | ICD-10-CM

## 2013-03-01 DIAGNOSIS — L03119 Cellulitis of unspecified part of limb: Secondary | ICD-10-CM

## 2013-03-01 LAB — POCT CBC
Granulocyte percent: 65.6 %G (ref 37–80)
HCT, POC: 46.4 % (ref 43.5–53.7)
Hemoglobin: 15 g/dL (ref 14.1–18.1)
Lymph, poc: 4.7 — AB (ref 0.6–3.4)
MCH, POC: 31.6 pg — AB (ref 27–31.2)
MCHC: 32.3 g/dL (ref 31.8–35.4)
MCV: 97.9 fL — AB (ref 80–97)
MID (cbc): 0.9 (ref 0–0.9)
MPV: 12.1 fL (ref 0–99.8)
POC Granulocyte: 10.7 — AB (ref 2–6.9)
POC LYMPH PERCENT: 28.7 %L (ref 10–50)
POC MID %: 5.7 %M (ref 0–12)
Platelet Count, POC: 110 10*3/uL — AB (ref 142–424)
RBC: 4.74 M/uL (ref 4.69–6.13)
RDW, POC: 13.7 %
WBC: 16.3 10*3/uL — AB (ref 4.6–10.2)

## 2013-03-01 LAB — POCT SEDIMENTATION RATE: POCT SED RATE: 11 mm/hr (ref 0–22)

## 2013-03-01 MED ORDER — CEFTRIAXONE SODIUM 1 G IJ SOLR
1.0000 g | Freq: Once | INTRAMUSCULAR | Status: AC
  Administered 2013-03-01: 1 g via INTRAMUSCULAR

## 2013-03-01 NOTE — Patient Instructions (Signed)
Cellulitis Cellulitis is an infection of the skin and the tissue beneath it. The infected area is usually red and tender. Cellulitis occurs most often in the arms and lower legs.  CAUSES  Cellulitis is caused by bacteria that enter the skin through cracks or cuts in the skin. The most common types of bacteria that cause cellulitis are Staphylococcus and Streptococcus. SYMPTOMS   Redness and warmth.  Swelling.  Tenderness or pain.  Fever. DIAGNOSIS  Your caregiver can usually determine what is wrong based on a physical exam. Blood tests may also be done. TREATMENT  Treatment usually involves taking an antibiotic medicine. HOME CARE INSTRUCTIONS   Take your antibiotics as directed. Finish them even if you start to feel better.  Keep the infected arm or leg elevated to reduce swelling.  Apply a warm cloth to the affected area up to 4 times per day to relieve pain.  Only take over-the-counter or prescription medicines for pain, discomfort, or fever as directed by your caregiver.  Keep all follow-up appointments as directed by your caregiver. SEEK MEDICAL CARE IF:   You notice red streaks coming from the infected area.  Your red area gets larger or turns dark in color.  Your bone or joint underneath the infected area becomes painful after the skin has healed.  Your infection returns in the same area or another area.  You notice a swollen bump in the infected area.  You develop new symptoms. SEEK IMMEDIATE MEDICAL CARE IF:   You have a fever.  You feel very sleepy.  You develop vomiting or diarrhea.  You have a general ill feeling (malaise) with muscle aches and pains. MAKE SURE YOU:   Understand these instructions.  Will watch your condition.  Will get help right away if you are not doing well or get worse. Document Released: 03/16/2005 Document Revised: 12/06/2011 Document Reviewed: 08/22/2011 [employer] Patient Information 2014 [employer].  

## 2013-03-01 NOTE — Progress Notes (Signed)
[address]   [place], [place]  [address]   [phone]  Subjective:    Patient ID: [name], male    DOB: 03-23-1941, 72 y.o.   MRN: [id number]  HPI This 72 y.o. male presents for 24 hour follow-up for foot cellulitis.  R foot swelling has worsened; redness has worsened.  +thinks may have run fever yesterday at 2:00pm; took Tylenol with improvement.  No fever this morning.  Minimal pain in foot.  Tolerating Keflex and Bactrim with minimal side effects; some GI upset.  No R inguinal/groin pain.  No drainage overnight.  Review of Systems  Constitutional: Positive for fever, chills and diaphoresis.  Musculoskeletal: Positive for myalgias and joint swelling.  Skin: Positive for color change and wound. Negative for pallor.   Past Medical History  Diagnosis Date  . Hypertension   . Arthritis   . Hepatitis 1966    Drug reaction after taking medication  . Hyperlipemia   . Lung nodule seen on imaging study     bilateral lungs  . GERD (gastroesophageal reflux disease)    Past Surgical History  Procedure Laterality Date  . Tonsillectomy    . Appendectomy    . Colonoscopy w/ polypectomy    . Arthroscoyp  2000    left knee  . Skin cancer excision  5-6 years ago    moe-s surgery- basal b  . Lumbar laminectomy/decompression microdiscectomy  06/27/2012    Procedure: LUMBAR LAMINECTOMY/DECOMPRESSION MICRODISCECTOMY 1 LEVEL;  Surgeon: Tia Alert, MD;  Location: [facility];  Service: Neurosurgery;  Laterality: Bilateral;  bilateral four-five laminectony   No Known Allergies Current Outpatient Prescriptions on File Prior to Visit  Medication Sig Dispense Refill  . acetaminophen (TYLENOL) 325 MG tablet Take 650-1,300 mg by mouth daily as needed. For pain      . bisoprolol-hydrochlorothiazide (ZIAC) 10-6.25 MG per tablet Take 1 tablet by mouth daily.      . cephALEXin (KEFLEX) 500 MG capsule Take 1 capsule (500 mg total) by mouth 3 (three) times daily.  30 capsule  0  .  EPINEPHrine (EPIPEN) 0.3 mg/0.3 mL DEVI Inject 0.3 mLs (0.3 mg total) into the muscle once.  1 Device  0  . ibuprofen (ADVIL,MOTRIN) 200 MG tablet Take 200-800 mg by mouth every 6 (six) hours as needed. For pain      . simvastatin (ZOCOR) 40 MG tablet Take 20 mg by mouth every evening.      . sulfamethoxazole-trimethoprim (BACTRIM DS) 800-160 MG per tablet Take 1 tablet by mouth 2 (two) times daily.  20 tablet  0  . Tamsulosin HCl (FLOMAX) 0.4 MG CAPS Take 1 capsule (0.4 mg total) by mouth daily.  30 capsule  0  . HYDROcodone-acetaminophen (NORCO/VICODIN) 5-325 MG per tablet Take 1-2 tablets by mouth every 4 (four) hours as needed.  50 tablet  0   No current facility-administered medications on file prior to visit.       Objective:   Physical Exam  Nursing note and vitals reviewed. Constitutional: He is oriented to person, place, and time. He appears well-developed and well-nourished. No distress.  Neurological: He is alert and oriented to person, place, and time.  Skin: He is not diaphoretic. There is erythema.  R foot with swelling diffusely along lateral and distal aspect of foot involving 4th, 5th digits; non-tender to palpation; +warmth; +erythema extending outside of marked area to proximal foot but erythema mild.  +scant erythema anterior shin with warmth yet mild.  No fluctuants.  Plantar aspect of 4th, 5th toes with persistent skin breakdown yet no active drainage and no fluctuants.  Psychiatric: He has a normal mood and affect. His behavior is normal.   ROCEPHIN 1 GRAM IM ADMINISTERED IN OFFICE.  Results for orders placed in visit on 03/01/13  POCT CBC      Result Value Range   WBC 16.3 (*) 4.6 - 10.2 K/uL   Lymph, poc 4.7 (*) 0.6 - 3.4   POC LYMPH PERCENT 28.7  10 - 50 %L   MID (cbc) 0.9  0 - 0.9   POC MID % 5.7  0 - 12 %M   POC Granulocyte 10.7 (*) 2 - 6.9   Granulocyte percent 65.6  37 - 80 %G   RBC 4.74  4.69 - 6.13 M/uL   Hemoglobin 15.0  14.1 - 18.1 g/dL   HCT, POC  56.2  13.0 - 53.7 %   MCV 97.9 (*) 80 - 97 fL   MCH, POC 31.6 (*) 27 - 31.2 pg   MCHC 32.3  31.8 - 35.4 g/dL   RDW, POC 86.5     Platelet Count, POC 110 (*) 142 - 424 K/uL   MPV 12.1  0 - 99.8 fL       Assessment & Plan:  Cellulitis of right foot - Plan: POCT CBC, POCT SEDIMENTATION RATE, cefTRIAXone (ROCEPHIN) injection 1 g   1. R foot cellulitis:  Worsening in past 24 hours; increase Bactrim to tid; continue Keflex tid; s/p Rocephin 1 gram IM in office. Elevate foot as much as possible; avoid prolonged ambulation; RTC 24 hours for reevaluation.  Meds ordered this encounter  Medications  . cefTRIAXone (ROCEPHIN) injection 1 g    Sig:

## 2013-03-02 ENCOUNTER — Ambulatory Visit (INDEPENDENT_AMBULATORY_CARE_PROVIDER_SITE_OTHER): Payer: Medicare Other | Admitting: Family Medicine

## 2013-03-02 ENCOUNTER — Encounter: Payer: Self-pay | Admitting: Family Medicine

## 2013-03-02 VITALS — BP 128/72 | HR 74 | Temp 97.8°F | Resp 16 | Ht 63.75 in | Wt 190.8 lb

## 2013-03-02 DIAGNOSIS — L0291 Cutaneous abscess, unspecified: Secondary | ICD-10-CM

## 2013-03-02 DIAGNOSIS — L0390 Cellulitis, unspecified: Secondary | ICD-10-CM

## 2013-03-02 LAB — POCT CBC
Granulocyte percent: 64.2 %G (ref 37–80)
HCT, POC: 47.6 % (ref 43.5–53.7)
Hemoglobin: 15.1 g/dL (ref 14.1–18.1)
Lymph, poc: 4 — AB (ref 0.6–3.4)
MCH, POC: 31.3 pg — AB (ref 27–31.2)
MCHC: 31.7 g/dL — AB (ref 31.8–35.4)
MCV: 98.7 fL — AB (ref 80–97)
MID (cbc): 0.9 (ref 0–0.9)
MPV: 11.9 fL (ref 0–99.8)
POC Granulocyte: 8.7 — AB (ref 2–6.9)
POC LYMPH PERCENT: 29.3 %L (ref 10–50)
POC MID %: 6.5 %M (ref 0–12)
Platelet Count, POC: 114 10*3/uL — AB (ref 142–424)
RBC: 4.82 M/uL (ref 4.69–6.13)
RDW, POC: 13.5 %
WBC: 13.5 10*3/uL — AB (ref 4.6–10.2)

## 2013-03-02 MED ORDER — CEFTRIAXONE SODIUM 1 G IJ SOLR
1.0000 g | INTRAMUSCULAR | Status: DC
  Administered 2013-03-02: 1 g via INTRAMUSCULAR

## 2013-03-02 NOTE — Progress Notes (Signed)
[address]   [place], [place]  [address]   [phone]  Subjective:    Patient ID: [name], male    DOB: [birth year], 72 y.o.   MRN: [id number]  HPI This 72 y.o. male presents for 24 hour follow-up of R foot cellulitis.  No fever/chills/sweats.  +malaise yesterday afternoon for a brief period.  Minimal pain in R foot.  +redness around 4th, 5th toes has worsened since yesterday.  One new small spot of redness along R distal anterior shin.  Swelling of R foot worsened yesterday but has improved today.  No drainage of toe.  Applying Lotrimin to toes bid without difficulties.  Compliance with Keflex 500mg  tid, Bactrim DS tid.    Review of Systems  Constitutional: Positive for fatigue. Negative for fever, chills and diaphoresis.  Musculoskeletal: Positive for joint swelling.  Skin: Positive for color change, rash and wound. Negative for pallor.   Past Medical History  Diagnosis Date  . Hypertension   . Arthritis   . Hepatitis 1966    Drug reaction after taking medication  . Hyperlipemia   . Lung nodule seen on imaging study     bilateral lungs  . GERD (gastroesophageal reflux disease)    No Known Allergies Current Outpatient Prescriptions on File Prior to Visit  Medication Sig Dispense Refill  . acetaminophen (TYLENOL) 325 MG tablet Take 650-1,300 mg by mouth daily as needed. For pain      . bisoprolol-hydrochlorothiazide (ZIAC) 10-6.25 MG per tablet Take 1 tablet by mouth daily.      . cephALEXin (KEFLEX) 500 MG capsule Take 1 capsule (500 mg total) by mouth 3 (three) times daily.  30 capsule  0  . EPINEPHrine (EPIPEN) 0.3 mg/0.3 mL DEVI Inject 0.3 mLs (0.3 mg total) into the muscle once.  1 Device  0  . ibuprofen (ADVIL,MOTRIN) 200 MG tablet Take 200-800 mg by mouth every 6 (six) hours as needed. For pain      . simvastatin (ZOCOR) 40 MG tablet Take 20 mg by mouth every evening.      . sulfamethoxazole-trimethoprim (BACTRIM DS) 800-160 MG per tablet Take 1 tablet by mouth 2  (two) times daily.  20 tablet  0  . Tamsulosin HCl (FLOMAX) 0.4 MG CAPS Take 1 capsule (0.4 mg total) by mouth daily.  30 capsule  0   No current facility-administered medications on file prior to visit.       Objective:   Physical Exam  Nursing note and vitals reviewed. Constitutional: He is oriented to person, place, and time. He appears well-developed and well-nourished. No distress.  Musculoskeletal:  R foot diffusely swollen from 4th, 5th toes and extends proximally up foot but unchanged from yesterday; non-tender to palpation throughout foot; good ROM 4th, 5th toes.  Neurological: He is alert and oriented to person, place, and time.  Skin: He is not diaphoretic. There is erythema.  +increase in erythema along 4th, 5th toes; persistent and unchanged slight erythema extends proximally up foot; +streaking persistent anterior shin with small new area of erythema distal to original area of erythema; no fluctuants.  Interdigit space between 4th, 5th digits with skin breakdown of skin of both toes without deep involvement; very superficial skin breakdown.  Residual drainage yellow-bloody present; wound culture obtained; area cleansed with sterile saline; cotton ball applied between two digits.  Psychiatric: He has a normal mood and affect. His behavior is normal.      Results for orders placed in visit on 03/02/13  POCT CBC      Result Value Range   WBC 13.5 (*) 4.6 - 10.2 K/uL   Lymph, poc 4.0 (*) 0.6 - 3.4   POC LYMPH PERCENT 29.3  10 - 50 %L   MID (cbc) 0.9  0 - 0.9   POC MID % 6.5  0 - 12 %M   POC Granulocyte 8.7 (*) 2 - 6.9   Granulocyte percent 64.2  37 - 80 %G   RBC 4.82  4.69 - 6.13 M/uL   Hemoglobin 15.1  14.1 - 18.1 g/dL   HCT, POC 16.1  09.6 - 53.7 %   MCV 98.7 (*) 80 - 97 fL   MCH, POC 31.3 (*) 27 - 31.2 pg   MCHC 31.7 (*) 31.8 - 35.4 g/dL   RDW, POC 04.5     Platelet Count, POC 114 (*) 142 - 424 K/uL   MPV 11.9  0 - 99.8 fL   ROCEPHIN 1 GRAM IM ADMINISTERED BY  ANGIE PAYNE.    Assessment & Plan:  Cellulitis - Plan: POCT CBC, Wound culture, cefTRIAXone (ROCEPHIN) injection 1 g  1. Cellulitis R foot:  Unchanged in 24 hours.  S/p repeat Rocephin injection; increase Keflex to qid; increase Bactrim DS to qid dosing.  CBC improved slightly; close follow-up in 24 hours with Dr. Cleta Alberts. Wound culture sent of drainage.  Elevate foot at rest.  Keep interdigit space clean and dry. 2.  Tinea pedis R: persistent; continue Lotrimin.

## 2013-03-03 ENCOUNTER — Ambulatory Visit (INDEPENDENT_AMBULATORY_CARE_PROVIDER_SITE_OTHER): Payer: Medicare Other | Admitting: Emergency Medicine

## 2013-03-03 VITALS — BP 140/83 | HR 77 | Temp 98.2°F | Resp 18 | Ht 63.75 in | Wt 190.0 lb

## 2013-03-03 DIAGNOSIS — L0291 Cutaneous abscess, unspecified: Secondary | ICD-10-CM

## 2013-03-03 DIAGNOSIS — L0390 Cellulitis, unspecified: Secondary | ICD-10-CM

## 2013-03-03 MED ORDER — SULFAMETHOXAZOLE-TMP DS 800-160 MG PO TABS: 1.0000 | ORAL_TABLET | Freq: Four times a day (QID) | ORAL | Status: DC

## 2013-03-03 MED ORDER — CEPHALEXIN 500 MG PO CAPS: 500.0000 mg | ORAL_CAPSULE | Freq: Four times a day (QID) | ORAL | Status: DC

## 2013-03-03 NOTE — Progress Notes (Signed)
  Subjective:    Patient ID: ESCHOL AUXIER, male    DOB: 04/27/1941, 72 y.o.   MRN: [id number]  HPI  This 72 y.o. male presents for 24 hour follow-up of R foot cellulitis. No fever/chills/sweats. +malaise yesterday afternoon for a brief period. Minimal pain in R foot. +redness around 4th, 5th toes has worsened since yesterday. One new small spot of redness along R distal anterior shin. Swelling of R foot worsened yesterday but has improved today. No drainage of toe. Applying Lotrimin to toes bid without difficulties. Compliance with Keflex 500mg  tid, Bactrim DS tid.    Review of Systems     Objective:   Physical Exam the area between the fourth and fifth toe now has some peeling of the skin. The areas of redness over the fourth and fifth toes as well as over the right shin have decreased in size.        Assessment & Plan:  His cellulitis is resolving. He is much improved from yesterday. We'll make no changes in his medications. Cultures are still pending. He is going to get some probiotics and start on these. He will continue applying antifungal cream between his fourth and fifth toes.

## 2013-03-05 LAB — WOUND CULTURE
Gram Stain: NONE SEEN
Gram Stain: NONE SEEN

## 2013-03-05 MED ORDER — CIPROFLOXACIN HCL 500 MG PO TABS: 500.0000 mg | ORAL_TABLET | Freq: Two times a day (BID) | ORAL | Status: DC

## 2013-03-05 NOTE — Addendum Note (Signed)
Addended by: Ethelda Chick on: 03/05/2013 04:55 PM   Modules accepted: Orders

## 2013-03-06 ENCOUNTER — Ambulatory Visit (INDEPENDENT_AMBULATORY_CARE_PROVIDER_SITE_OTHER): Payer: Medicare Other | Admitting: Family Medicine

## 2013-03-06 VITALS — BP 126/80 | Temp 98.6°F

## 2013-03-06 DIAGNOSIS — L02619 Cutaneous abscess of unspecified foot: Secondary | ICD-10-CM

## 2013-03-06 DIAGNOSIS — L03115 Cellulitis of right lower limb: Secondary | ICD-10-CM

## 2013-03-06 DIAGNOSIS — L03119 Cellulitis of unspecified part of limb: Secondary | ICD-10-CM

## 2013-03-06 NOTE — Progress Notes (Signed)
[address]   [place], [place]  [address]   [phone]  Subjective:    Patient ID: [name], male    DOB: 1940-10-25, 72 y.o.   MRN: [id number]  HPI This 72 y.o. male presents for 72 hour follow-up of R foot cellulitis.  No change in management made at last visit with Dr. Cleta Alberts; patient had improved with decreased swelling and redness.  Wound culture grew Enterobacter resistant to Bactrim and most Cephalosporins.  Since last visit, no fever/chills/sweats/malaise.  Decreased redness and swelling. Shoe is fitting normally now.  No drainage of wound.  Compliance with Bactrim and Keflex; did stop Keflex after phone call in past 24 hours regarding wound culture.  Continues to apply Lotrimin cream.  Redness along anterior shin has completely resolved.  Review of Systems  Constitutional: Negative for fever, chills, diaphoresis and fatigue.  Musculoskeletal: Positive for joint swelling.  Skin: Positive for color change, rash and wound. Negative for pallor.  Neurological: Positive for weakness. Negative for seizures.   Past Medical History  Diagnosis Date  . Hypertension   . Arthritis   . Hepatitis 1966    Drug reaction after taking medication  . Hyperlipemia   . Lung nodule seen on imaging study     bilateral lungs  . GERD (gastroesophageal reflux disease)    No Known Allergies Current Outpatient Prescriptions on File Prior to Visit  Medication Sig Dispense Refill  . acetaminophen (TYLENOL) 325 MG tablet Take 650-1,300 mg by mouth daily as needed. For pain      . bisoprolol-hydrochlorothiazide (ZIAC) 10-6.25 MG per tablet Take 1 tablet by mouth daily.      . cephALEXin (KEFLEX) 500 MG capsule Take 1 capsule (500 mg total) by mouth 4 (four) times daily.  20 capsule  0  . ciprofloxacin (CIPRO) 500 MG tablet Take 1 tablet (500 mg total) by mouth 2 (two) times daily.  20 tablet  0  . EPINEPHrine (EPIPEN) 0.3 mg/0.3 mL DEVI Inject 0.3 mLs (0.3 mg total) into the muscle once.  1 Device   0  . ibuprofen (ADVIL,MOTRIN) 200 MG tablet Take 200-800 mg by mouth every 6 (six) hours as needed. For pain      . simvastatin (ZOCOR) 40 MG tablet Take 20 mg by mouth every evening.      . sulfamethoxazole-trimethoprim (BACTRIM DS) 800-160 MG per tablet Take 1 tablet by mouth 4 (four) times daily.  20 tablet  0  . Tamsulosin HCl (FLOMAX) 0.4 MG CAPS Take 1 capsule (0.4 mg total) by mouth daily.  30 capsule  0   Current Facility-Administered Medications on File Prior to Visit  Medication Dose Route Frequency Provider Last Rate Last Dose  . cefTRIAXone (ROCEPHIN) injection 1 g  1 g Intramuscular Q24H Ethelda Chick, MD   1 g at 03/02/13 2130       Objective:   Physical Exam  Nursing note and vitals reviewed. Constitutional: He appears well-developed and well-nourished. No distress.  Skin: Skin is warm and dry. He is not diaphoretic. There is erythema.  R foot: persistent but mild erythema along 4th, 5th toes with scaling/peeling skin interdigit space with denuded skin.  No active drainage. Mild erythema along distal foot.  No swelling.  No streaking. Complete resolution of erythema anterior shin.  Non-tender to palpation; no fluctuants.  Psychiatric: He has a normal mood and affect. His behavior is normal.      Assessment & Plan:  Cellulitis of right foot  1.  R foot Cellulitis:  Improving; wound culture +Enterobacter S to Cipro and R to Bactrim, Keflex.  Advised to stop Keflex.  Continue Bactrim for any undetected MRSA activity.  Rx for Cipro sent to pharmacy yesterday.  2.  Tinea Pedis R:  Improving with Lotrimin; advised to continue.

## 2013-03-06 NOTE — Patient Instructions (Addendum)
1. Return on Wednesday, 03/13/13 at 4:30 pm at [address]

## 2013-03-08 NOTE — Progress Notes (Signed)
Overbook appt made with Dr. Katrinka Blazing for 03/13/13.

## 2013-03-13 ENCOUNTER — Ambulatory Visit (INDEPENDENT_AMBULATORY_CARE_PROVIDER_SITE_OTHER): Payer: Medicare Other | Admitting: Family Medicine

## 2013-03-13 ENCOUNTER — Encounter: Payer: Self-pay | Admitting: Family Medicine

## 2013-03-13 VITALS — BP 134/74 | HR 55 | Temp 98.1°F | Resp 16 | Ht 63.0 in | Wt 189.3 lb

## 2013-03-13 DIAGNOSIS — B353 Tinea pedis: Secondary | ICD-10-CM

## 2013-03-13 DIAGNOSIS — L03116 Cellulitis of left lower limb: Secondary | ICD-10-CM

## 2013-03-13 DIAGNOSIS — L02619 Cutaneous abscess of unspecified foot: Secondary | ICD-10-CM

## 2013-03-13 DIAGNOSIS — L03119 Cellulitis of unspecified part of limb: Secondary | ICD-10-CM

## 2013-03-13 NOTE — Progress Notes (Signed)
[address]   [place], [place]  [address]   [phone]  Subjective:    Patient ID: [name], male    DOB: Mar 17, 1941, 72 y.o.   MRN: [id number]  HPI This 72 y.o. male presents for evaluation of L foot cellulitis.  Doing well.  Redness and swelling much improved and nearly resolved. No fever/chills/sweats.  No pain in foot.  Ambulating without difficulties. To complete Cipro therapy in upcoming few days. Continues to apply antifungal to interdigit spaces. Denies malaise or fatigue.  Review of Systems  Constitutional: Negative for fever, chills, diaphoresis and fatigue.  Musculoskeletal: Negative for arthralgias, gait problem, joint swelling and myalgias.  Skin: Positive for color change and rash. Negative for pallor and wound.   Past Medical History  Diagnosis Date  . Hypertension   . Arthritis   . Hepatitis 1966    Drug reaction after taking medication  . Hyperlipemia   . Lung nodule seen on imaging study     bilateral lungs  . GERD (gastroesophageal reflux disease)    No Known Allergies Current Outpatient Prescriptions on File Prior to Visit  Medication Sig Dispense Refill  . acetaminophen (TYLENOL) 325 MG tablet Take 650-1,300 mg by mouth daily as needed. For pain      . bisoprolol-hydrochlorothiazide (ZIAC) 10-6.25 MG per tablet Take 1 tablet by mouth daily.      . ciprofloxacin (CIPRO) 500 MG tablet Take 1 tablet (500 mg total) by mouth 2 (two) times daily.  20 tablet  0  . EPINEPHrine (EPIPEN) 0.3 mg/0.3 mL DEVI Inject 0.3 mLs (0.3 mg total) into the muscle once.  1 Device  0  . ibuprofen (ADVIL,MOTRIN) 200 MG tablet Take 200-800 mg by mouth every 6 (six) hours as needed. For pain      . simvastatin (ZOCOR) 40 MG tablet Take 20 mg by mouth every evening.      . Tamsulosin HCl (FLOMAX) 0.4 MG CAPS Take 1 capsule (0.4 mg total) by mouth daily.  30 capsule  0  . cephALEXin (KEFLEX) 500 MG capsule Take 1 capsule (500 mg total) by mouth 4 (four) times daily.  20 capsule   0  . sulfamethoxazole-trimethoprim (BACTRIM DS) 800-160 MG per tablet Take 1 tablet by mouth 4 (four) times daily.  20 tablet  0   Current Facility-Administered Medications on File Prior to Visit  Medication Dose Route Frequency Provider Last Rate Last Dose  . cefTRIAXone (ROCEPHIN) injection 1 g  1 g Intramuscular Q24H Ethelda Chick, MD   1 g at 03/02/13 4696       Objective:   Physical Exam  Nursing note and vitals reviewed. Constitutional: He is oriented to person, place, and time. He appears well-developed and well-nourished.  Cardiovascular: Intact distal pulses.   Pulses:      Dorsalis pedis pulses are 2+ on the left side.  Capillary refill < 3 seconds.  Musculoskeletal:       Left ankle: Normal.       Left foot: Normal. He exhibits normal range of motion, no tenderness and no swelling.  Neurological: He is alert and oriented to person, place, and time.  Skin:  Decreased erythema of distal foot and ankle; minimal erythema; no induration or swelling; no fluctuance or tenderness.  No streaking; anterior shin area of erythema resolved.  Scaling skin interdigit spaces of L foot improved and much less.  Psychiatric: He has a normal mood and affect. His behavior is normal.  Assessment & Plan:  Cellulitis of left foot  Tinea pedis   1.  L foot cellulitis: improving; complete Cipro therapy.  Local wound care.  RTC PRN. 2.  Tinea pedis L: improving; continue topical antifungal for one more week.

## 2013-08-29 ENCOUNTER — Encounter: Payer: Self-pay | Admitting: Cardiology

## 2013-10-08 ENCOUNTER — Ambulatory Visit: Payer: Medicare Other | Admitting: Cardiology

## 2013-12-10 ENCOUNTER — Encounter: Payer: Self-pay | Admitting: *Deleted

## 2014-11-12 ENCOUNTER — Telehealth: Payer: Self-pay | Admitting: Oncology

## 2014-11-12 NOTE — Telephone Encounter (Signed)
new patient appt-per patient emailed np appt 06/28 @ 1:30 w/[name]. [email or web address]

## 2014-12-16 ENCOUNTER — Ambulatory Visit: Payer: Medicare Other

## 2014-12-16 ENCOUNTER — Ambulatory Visit (HOSPITAL_BASED_OUTPATIENT_CLINIC_OR_DEPARTMENT_OTHER): Payer: Medicare Other | Admitting: Oncology

## 2014-12-16 ENCOUNTER — Other Ambulatory Visit (HOSPITAL_COMMUNITY)
Admission: RE | Admit: 2014-12-16 | Discharge: 2014-12-16 | Disposition: A | Discharge status: Home / Self Care | Payer: Medicare Other | Source: Ambulatory Visit | Attending: Oncology | Admitting: Oncology

## 2014-12-16 ENCOUNTER — Ambulatory Visit (HOSPITAL_BASED_OUTPATIENT_CLINIC_OR_DEPARTMENT_OTHER): Payer: Medicare Other

## 2014-12-16 ENCOUNTER — Encounter: Payer: Self-pay | Admitting: Oncology

## 2014-12-16 VITALS — BP 158/74 | HR 58 | Temp 98.3°F | Resp 18 | Ht 63.0 in | Wt 192.3 lb

## 2014-12-16 DIAGNOSIS — D72820 Lymphocytosis (symptomatic): Secondary | ICD-10-CM

## 2014-12-16 DIAGNOSIS — D696 Thrombocytopenia, unspecified: Secondary | ICD-10-CM | POA: Diagnosis not present

## 2014-12-16 DIAGNOSIS — C9110 Chronic lymphocytic leukemia of B-cell type not having achieved remission: Secondary | ICD-10-CM | POA: Diagnosis present

## 2014-12-16 HISTORY — DX: Chronic lymphocytic leukemia of B-cell type not having achieved remission: C91.10

## 2014-12-16 LAB — BASIC METABOLIC PANEL (CC13)
Anion Gap: 4 mEq/L (ref 3–11)
BUN: 17.2 mg/dL (ref 7.0–26.0)
CO2: 24 mEq/L (ref 22–29)
Calcium: 8.7 mg/dL (ref 8.4–10.4)
Chloride: 111 mEq/L — ABNORMAL HIGH (ref 98–109)
Creatinine: 1.3 mg/dL (ref 0.7–1.3)
EGFR: 55 mL/min/{1.73_m2} — ABNORMAL LOW (ref >90)
Glucose: 71 mg/dl (ref 70–140)
Potassium: 4.1 mEq/L (ref 3.5–5.1)
Sodium: 140 mEq/L (ref 136–145)

## 2014-12-16 LAB — CBC WITH DIFFERENTIAL/PLATELET
BASO%: 0.2 % (ref 0.0–2.0)
Basophils Absolute: 0.1 10*3/uL (ref 0.0–0.1)
EOS%: 1.7 % (ref 0.0–7.0)
Eosinophils Absolute: 0.4 10*3/uL (ref 0.0–0.5)
HCT: 44.6 % (ref 38.4–49.9)
HGB: 15.4 g/dL (ref 13.0–17.1)
LYMPH%: 54.1 % — ABNORMAL HIGH (ref 14.0–49.0)
MCH: 31.5 pg (ref 27.2–33.4)
MCHC: 34.5 g/dL (ref 32.0–36.0)
MCV: 91.2 fL (ref 79.3–98.0)
MONO#: 1.7 10*3/uL — ABNORMAL HIGH (ref 0.1–0.9)
MONO%: 8.2 % (ref 0.0–14.0)
NEUT#: 7.6 10*3/uL — ABNORMAL HIGH (ref 1.5–6.5)
NEUT%: 35.8 % — ABNORMAL LOW (ref 39.0–75.0)
Platelets: 121 10*3/uL — ABNORMAL LOW (ref 140–400)
RBC: 4.89 10*6/uL (ref 4.20–5.82)
RDW: 12.9 % (ref 11.0–14.6)
WBC: 21.2 10*3/uL — ABNORMAL HIGH (ref 4.0–10.3)
lymph#: 11.5 10*3/uL — ABNORMAL HIGH (ref 0.9–3.3)

## 2014-12-16 LAB — MORPHOLOGY
PLT EST: DECREASED
Platelet Morphology: NORMAL
RBC Comments: NORMAL

## 2014-12-16 LAB — CHCC SMEAR

## 2014-12-16 LAB — LACTATE DEHYDROGENASE (CC13): LDH: 171 U/L (ref 125–245)

## 2014-12-16 NOTE — Progress Notes (Signed)
[facility] Patient Consult   Referring MD: [name] Huddle, Md [address] [place], [place] [address]   [name] 73 y.o.  1941-05-01    Reason for Referral: Lymphocytosis   HPI: [name] reports being aware of a mildly elevated white blood cell count for several years. He saw Dr. Inda Mathews for a routine physical on 10/15/2014. A CBC found the hemoglobin 15.5, MCV 92.5, platelets 101,000, and the white count returned at 15.1 with an absolute lymphocyte count of 8.8. The absolute neutrophil count was measured at 5.2.  He reports feeling well. No recent infection.  Past Medical History  Diagnosis Date  . Hypertension   . Arthritis   . Hepatitis 1966    Drug reaction after taking medication  . Hyperlipemia   . Lung nodule seen on imaging study     bilateral lungs  . GERD (gastroesophageal reflux disease)     .   BPH   .   Anaphylactic reaction-etiology unknown                                                       2012   .   History of multiple skin cancers-basal cell carcinomas and squamous cell carcinomas  Past Surgical History  Procedure Laterality Date  . Tonsillectomy    . Appendectomy    . Colonoscopy w/ polypectomy    . Arthroscoyp  2000    left knee  . Skin cancer excision  5-6 years ago    moe-s surgery- basal b  . Lumbar laminectomy/decompression microdiscectomy  06/27/2012    Procedure: LUMBAR LAMINECTOMY/DECOMPRESSION MICRODISCECTOMY 1 LEVEL;  Surgeon: [name] Moore, MD;  Location: [facility];  Service: Neurosurgery;  Laterality: Bilateral;  bilateral four-five laminectony    Medications: Reviewed  Allergies:  Allergies  Allergen Reactions  . Dust Mite Extract     Family history: 2 sisters and 2 daughters. No family history of malignancy.  Social History:   He is retired Hotel manager. He lives with his wife in [place]. He does not use cigarettes. Social alcohol use. No risk factor for HIV or hepatitis. No  transfusion history.  History  Alcohol Use  . 1.2 oz/week  . 2 Cans of beer per week    Comment: rare 2 beers per month    History  Smoking status  . Never Smoker   Smokeless tobacco  . Not on file      ROS:   Positives include: Chronic exertional dyspnea  A complete ROS was otherwise negative.  Physical Exam:  Blood pressure 158/74, pulse 58, temperature 98.3 F (36.8 C), temperature source Oral, resp. rate 18, height 5\' 3"  (1.6 m), weight 192 lb 4.8 oz (87.227 kg), SpO2 100 %.  HEENT: Oropharynx without visible mass, neck without mass Lungs: Clear bilaterally Cardiac: Regular rate and rhythm Abdomen: No hepatosplenomegaly GU: Testes without mass  Vascular: Trace low pretibial edema bilaterally, left greater than right Lymph nodes: No cervical, supraclavicular, axillary, or inguinal nodes Neurologic: Alert and oriented, the motor exam appears intact in the upper and lower extremities Skin: No rash Musculoskeletal: No spine tenderness   LAB:  CBC  Lab Results  Component Value Date   WBC 21.2* 12/16/2014   HGB 15.4 12/16/2014   HCT 44.6 12/16/2014   MCV  91.2 12/16/2014   PLT 121* 12/16/2014   NEUTROABS 7.6* 12/16/2014     Review the peripheral blood smear: There is a leukocytosis and the majority of the white cells are mature lymphocytes. No prolymphocytes or blast forms. A few small platelet clumps. The red cell morphology is unremarkable. Multiple "smudge "cells   Assessment/Plan:   1. Lymphocytosis  Review of the peripheral blood smear is consistent with chronic lymphocytic leukemia 2. Mild thrombocytopenia-likely secondary to chronic lymphocytic leukemia versus "pseudo thrombocytopenia "due to platelet clumping   Disposition:   [name] is referred for evaluation of lymphocytosis. The clinical presentation and review of the peripheral blood smear are consistent with a diagnosis of chronic lymphocytic leukemia. We will submit peripheral blood  for flow cytometry to confirm this diagnosis.  He is asymptomatic from the CLL. There is no indication for treatment at present. I discussed the natural history of CLL and indications for treatment with him. He understands treatment indications include the development of "B symptoms ", bothersome lymphadenopathy, or progressive cytopenias.  He is at increased risk for infections with the CLL. He will stay up-to-date on the influenza and pneumonia vaccines. He will seek medical attention for symptoms of an infection. We obtained quantitative immunoglobulin levels today.  [name] would like to continue clinical follow-up with Dr. Inda Mathews. I recommend Dr. Inda Mathews obtain a yearly CBC and refer him back to [place] if he develops anemia or progressive thrombocytopenia. We will contact him when the flow cytometry result is available. I am available to see him in the future as needed.  Approximate 50 minutes were spent with the patient today. The majority of the time was used for counseling and coordination of care.  [place], [place] 12/16/2014, 5:59 PM

## 2014-12-17 LAB — IGG, IGA, IGM
IgA: 25 mg/dL — ABNORMAL LOW (ref 68–379)
IgG (Immunoglobin G), Serum: 639 mg/dL — ABNORMAL LOW (ref 650–1600)
IgM, Serum: 8 mg/dL — ABNORMAL LOW (ref 41–251)

## 2014-12-17 LAB — FLOW CYTOMETRY

## 2014-12-18 ENCOUNTER — Telehealth: Payer: Self-pay | Admitting: *Deleted

## 2014-12-18 NOTE — Telephone Encounter (Signed)
Per [name]; left voice message to call office re: results of cytometry (see note below).  Lab and flow cytometry report sent to Dr. Inda Merlin.

## 2014-12-18 NOTE — Telephone Encounter (Signed)
-----   Message from Ladell Pier, MD sent at 12/17/2014  8:35 PM EDT ----- Please call patient, flow cytometry is consistent with CLL.Immunoglobulins are low as often seen in CLL. Seek medical attention for symptoms of infection Copy lab and flow cytometry report to Dr.Gates

## 2014-12-18 NOTE — Telephone Encounter (Signed)
Patient called and left message.  Would like to leave message for [name].  "Thank you for your very thorough explanation and physical exam.  I really appreciate it.  Thank you."

## 2014-12-19 NOTE — Telephone Encounter (Signed)
Pt returned call.  Per [name]; notified pt that flow cytometry is consistent with CLL; immunoglobulins are low as well which is often seen in CLL.  Instructions given to seek medical attention for any symptoms of infection and reports have been sent to Dr. Inda Merlin.  Pt verbalized understanding and "really appreciates [name], tell him Thank You"

## 2016-05-02 ENCOUNTER — Other Ambulatory Visit: Payer: Self-pay | Admitting: Gastroenterology

## 2016-07-26 ENCOUNTER — Ambulatory Visit (HOSPITAL_COMMUNITY): Admit: 2016-07-26 | Payer: Medicare Other | Admitting: Gastroenterology

## 2016-07-26 ENCOUNTER — Encounter (HOSPITAL_COMMUNITY): Payer: Self-pay

## 2016-07-26 SURGERY — COLONOSCOPY WITH PROPOFOL
Anesthesia: Monitor Anesthesia Care

## 2016-08-16 ENCOUNTER — Inpatient Hospital Stay (HOSPITAL_COMMUNITY)
Admission: EM | Admit: 2016-08-16 | Discharge: 2016-08-20 | DRG: 871 | Disposition: A | Discharge status: Home / Self Care | Payer: Medicare Other | Attending: Internal Medicine | Admitting: Internal Medicine

## 2016-08-16 ENCOUNTER — Emergency Department (HOSPITAL_COMMUNITY): Payer: Medicare Other

## 2016-08-16 ENCOUNTER — Encounter (HOSPITAL_COMMUNITY): Payer: Self-pay | Admitting: *Deleted

## 2016-08-16 DIAGNOSIS — R739 Hyperglycemia, unspecified: Secondary | ICD-10-CM | POA: Diagnosis present

## 2016-08-16 DIAGNOSIS — G9340 Encephalopathy, unspecified: Secondary | ICD-10-CM | POA: Diagnosis present

## 2016-08-16 DIAGNOSIS — A4150 Gram-negative sepsis, unspecified: Secondary | ICD-10-CM | POA: Diagnosis not present

## 2016-08-16 DIAGNOSIS — K219 Gastro-esophageal reflux disease without esophagitis: Secondary | ICD-10-CM | POA: Diagnosis present

## 2016-08-16 DIAGNOSIS — I10 Essential (primary) hypertension: Secondary | ICD-10-CM | POA: Diagnosis not present

## 2016-08-16 DIAGNOSIS — N182 Chronic kidney disease, stage 2 (mild): Secondary | ICD-10-CM | POA: Diagnosis present

## 2016-08-16 DIAGNOSIS — E785 Hyperlipidemia, unspecified: Secondary | ICD-10-CM | POA: Diagnosis present

## 2016-08-16 DIAGNOSIS — E872 Acidosis: Secondary | ICD-10-CM | POA: Diagnosis present

## 2016-08-16 DIAGNOSIS — N179 Acute kidney failure, unspecified: Secondary | ICD-10-CM | POA: Diagnosis present

## 2016-08-16 DIAGNOSIS — Z7982 Long term (current) use of aspirin: Secondary | ICD-10-CM | POA: Diagnosis not present

## 2016-08-16 DIAGNOSIS — A419 Sepsis, unspecified organism: Secondary | ICD-10-CM

## 2016-08-16 DIAGNOSIS — R7881 Bacteremia: Secondary | ICD-10-CM | POA: Diagnosis present

## 2016-08-16 DIAGNOSIS — R319 Hematuria, unspecified: Secondary | ICD-10-CM | POA: Diagnosis present

## 2016-08-16 DIAGNOSIS — E876 Hypokalemia: Secondary | ICD-10-CM

## 2016-08-16 DIAGNOSIS — R0600 Dyspnea, unspecified: Secondary | ICD-10-CM | POA: Diagnosis present

## 2016-08-16 DIAGNOSIS — Z888 Allergy status to other drugs, medicaments and biological substances status: Secondary | ICD-10-CM

## 2016-08-16 DIAGNOSIS — R7309 Other abnormal glucose: Secondary | ICD-10-CM | POA: Diagnosis not present

## 2016-08-16 DIAGNOSIS — Z981 Arthrodesis status: Secondary | ICD-10-CM

## 2016-08-16 DIAGNOSIS — Z79899 Other long term (current) drug therapy: Secondary | ICD-10-CM

## 2016-08-16 DIAGNOSIS — N400 Enlarged prostate without lower urinary tract symptoms: Secondary | ICD-10-CM | POA: Diagnosis present

## 2016-08-16 DIAGNOSIS — G9341 Metabolic encephalopathy: Secondary | ICD-10-CM | POA: Diagnosis present

## 2016-08-16 DIAGNOSIS — B9689 Other specified bacterial agents as the cause of diseases classified elsewhere: Secondary | ICD-10-CM | POA: Diagnosis not present

## 2016-08-16 DIAGNOSIS — N390 Urinary tract infection, site not specified: Secondary | ICD-10-CM | POA: Diagnosis present

## 2016-08-16 DIAGNOSIS — A4159 Other Gram-negative sepsis: Secondary | ICD-10-CM | POA: Diagnosis present

## 2016-08-16 DIAGNOSIS — M79671 Pain in right foot: Secondary | ICD-10-CM

## 2016-08-16 DIAGNOSIS — C9110 Chronic lymphocytic leukemia of B-cell type not having achieved remission: Secondary | ICD-10-CM | POA: Diagnosis present

## 2016-08-16 DIAGNOSIS — Z85828 Personal history of other malignant neoplasm of skin: Secondary | ICD-10-CM

## 2016-08-16 DIAGNOSIS — I129 Hypertensive chronic kidney disease with stage 1 through stage 4 chronic kidney disease, or unspecified chronic kidney disease: Secondary | ICD-10-CM | POA: Diagnosis present

## 2016-08-16 DIAGNOSIS — B029 Zoster without complications: Secondary | ICD-10-CM

## 2016-08-16 HISTORY — DX: Hypokalemia: E87.6

## 2016-08-16 HISTORY — DX: Acute kidney failure, unspecified: N17.9

## 2016-08-16 LAB — CBC
HCT: 31.1 % — ABNORMAL LOW (ref 39.0–52.0)
Hemoglobin: 10.6 g/dL — ABNORMAL LOW (ref 13.0–17.0)
MCH: 30.4 pg (ref 26.0–34.0)
MCHC: 34.1 g/dL (ref 30.0–36.0)
MCV: 89.1 fL (ref 78.0–100.0)
Platelets: 166 10*3/uL (ref 150–400)
RBC: 3.49 MIL/uL — ABNORMAL LOW (ref 4.22–5.81)
RDW: 13 % (ref 11.5–15.5)
WBC: 39.2 10*3/uL — ABNORMAL HIGH (ref 4.0–10.5)

## 2016-08-16 LAB — COMPREHENSIVE METABOLIC PANEL
ALT: 41 U/L (ref 17–63)
AST: 36 U/L (ref 15–41)
Albumin: 3 g/dL — ABNORMAL LOW (ref 3.5–5.0)
Alkaline Phosphatase: 115 U/L (ref 38–126)
Anion gap: 10 (ref 5–15)
BUN: 23 mg/dL — ABNORMAL HIGH (ref 6–20)
CO2: 21 mmol/L — ABNORMAL LOW (ref 22–32)
Calcium: 7.9 mg/dL — ABNORMAL LOW (ref 8.9–10.3)
Chloride: 100 mmol/L — ABNORMAL LOW (ref 101–111)
Creatinine, Ser: 1.94 mg/dL — ABNORMAL HIGH (ref 0.61–1.24)
GFR calc Af Amer: 37 mL/min — ABNORMAL LOW (ref >60)
GFR calc non Af Amer: 32 mL/min — ABNORMAL LOW (ref >60)
Glucose, Bld: 183 mg/dL — ABNORMAL HIGH (ref 65–99)
Potassium: 2.9 mmol/L — ABNORMAL LOW (ref 3.5–5.1)
Sodium: 131 mmol/L — ABNORMAL LOW (ref 135–145)
Total Bilirubin: 1.4 mg/dL — ABNORMAL HIGH (ref 0.3–1.2)
Total Protein: 6.3 g/dL — ABNORMAL LOW (ref 6.5–8.1)

## 2016-08-16 LAB — BLOOD GAS, ARTERIAL
Acid-base deficit: 1.7 mmol/L (ref 0.0–2.0)
Bicarbonate: 20 mmol/L (ref 20.0–28.0)
Drawn by: 232811
O2 Saturation: 95.3 %
Patient temperature: 98.6
pCO2 arterial: 25.8 mmHg — ABNORMAL LOW (ref 32.0–48.0)
pH, Arterial: 7.501 — ABNORMAL HIGH (ref 7.350–7.450)
pO2, Arterial: 73 mmHg — ABNORMAL LOW (ref 83.0–108.0)

## 2016-08-16 LAB — CBC WITH DIFFERENTIAL/PLATELET
Basophils Absolute: 0 10*3/uL (ref 0.0–0.1)
Basophils Relative: 0 %
Eosinophils Absolute: 0 10*3/uL (ref 0.0–0.7)
Eosinophils Relative: 0 %
HCT: 36.3 % — ABNORMAL LOW (ref 39.0–52.0)
Hemoglobin: 12.3 g/dL — ABNORMAL LOW (ref 13.0–17.0)
Lymphocytes Relative: 28 %
Lymphs Abs: 9.4 10*3/uL — ABNORMAL HIGH (ref 0.7–4.0)
MCH: 30 pg (ref 26.0–34.0)
MCHC: 33.9 g/dL (ref 30.0–36.0)
MCV: 88.5 fL (ref 78.0–100.0)
Monocytes Absolute: 1.3 10*3/uL — ABNORMAL HIGH (ref 0.1–1.0)
Monocytes Relative: 4 %
Neutro Abs: 23 10*3/uL — ABNORMAL HIGH (ref 1.7–7.7)
Neutrophils Relative %: 68 %
Platelets: 177 10*3/uL (ref 150–400)
RBC: 4.1 MIL/uL — ABNORMAL LOW (ref 4.22–5.81)
RDW: 13 % (ref 11.5–15.5)
WBC: 33.7 10*3/uL — ABNORMAL HIGH (ref 4.0–10.5)

## 2016-08-16 LAB — CREATININE, SERUM
Creatinine, Ser: 1.56 mg/dL — ABNORMAL HIGH (ref 0.61–1.24)
GFR calc Af Amer: 48 mL/min — ABNORMAL LOW (ref >60)
GFR calc non Af Amer: 42 mL/min — ABNORMAL LOW (ref >60)

## 2016-08-16 LAB — MRSA PCR SCREENING: MRSA by PCR: NEGATIVE

## 2016-08-16 LAB — URINALYSIS, ROUTINE W REFLEX MICROSCOPIC
Bilirubin Urine: NEGATIVE
Glucose, UA: NEGATIVE mg/dL
Ketones, ur: NEGATIVE mg/dL
Nitrite: NEGATIVE
Protein, ur: 100 mg/dL — AB
Specific Gravity, Urine: 1.015 (ref 1.005–1.030)
Squamous Epithelial / LPF: NONE SEEN
pH: 5 (ref 5.0–8.0)

## 2016-08-16 LAB — I-STAT CG4 LACTIC ACID, ED: Lactic Acid, Venous: 2.3 mmol/L (ref 0.5–1.9)

## 2016-08-16 LAB — I-STAT TROPONIN, ED: Troponin i, poc: 0.02 ng/mL (ref 0.00–0.08)

## 2016-08-16 LAB — LACTIC ACID, PLASMA: Lactic Acid, Venous: 1.4 mmol/L (ref 0.5–1.9)

## 2016-08-16 LAB — PROCALCITONIN: Procalcitonin: 4.7 ng/mL

## 2016-08-16 LAB — PROTIME-INR
INR: 1.13
Prothrombin Time: 14.6 seconds (ref 11.4–15.2)

## 2016-08-16 LAB — APTT: aPTT: 42 seconds — ABNORMAL HIGH (ref 24–36)

## 2016-08-16 MED ORDER — ALBUTEROL SULFATE (2.5 MG/3ML) 0.083% IN NEBU: 2.5000 mg | INHALATION_SOLUTION | RESPIRATORY_TRACT | Status: DC | PRN

## 2016-08-16 MED ORDER — SODIUM CHLORIDE 0.9 % IV BOLUS (SEPSIS): 1000.0000 mL | Freq: Once | INTRAVENOUS | Status: DC

## 2016-08-16 MED ORDER — DEXTROSE 5 % IV SOLN
2.0000 g | Freq: Once | INTRAVENOUS | Status: AC
  Administered 2016-08-16: 2 g via INTRAVENOUS
  Filled 2016-08-16: qty 2

## 2016-08-16 MED ORDER — SODIUM CHLORIDE 0.9 % IV SOLN
INTRAVENOUS | Status: DC
  Administered 2016-08-16 – 2016-08-18 (×3): via INTRAVENOUS

## 2016-08-16 MED ORDER — DEXTROSE 5 % IV SOLN: 1.0000 g | INTRAVENOUS | Status: DC

## 2016-08-16 MED ORDER — POLYETHYLENE GLYCOL 3350 17 G PO PACK: 17.0000 g | PACK | Freq: Every day | ORAL | Status: DC | PRN

## 2016-08-16 MED ORDER — ACETAMINOPHEN 650 MG RE SUPP: 650.0000 mg | Freq: Four times a day (QID) | RECTAL | Status: DC | PRN

## 2016-08-16 MED ORDER — ONDANSETRON HCL 4 MG PO TABS: 4.0000 mg | ORAL_TABLET | Freq: Four times a day (QID) | ORAL | Status: DC | PRN

## 2016-08-16 MED ORDER — ONDANSETRON HCL 4 MG/2ML IJ SOLN: 4.0000 mg | Freq: Four times a day (QID) | INTRAMUSCULAR | Status: DC | PRN

## 2016-08-16 MED ORDER — ENOXAPARIN SODIUM 40 MG/0.4ML ~~LOC~~ SOLN
40.0000 mg | SUBCUTANEOUS | Status: DC
  Administered 2016-08-16: 40 mg via SUBCUTANEOUS
  Filled 2016-08-16: qty 0.4

## 2016-08-16 MED ORDER — VALACYCLOVIR HCL 500 MG PO TABS
1000.0000 mg | ORAL_TABLET | Freq: Three times a day (TID) | ORAL | Status: DC
  Administered 2016-08-16 – 2016-08-17 (×5): 1000 mg via ORAL
  Filled 2016-08-16 (×7): qty 2

## 2016-08-16 MED ORDER — ACETAMINOPHEN 325 MG PO TABS
650.0000 mg | ORAL_TABLET | Freq: Four times a day (QID) | ORAL | Status: DC | PRN
  Administered 2016-08-16 – 2016-08-17 (×4): 650 mg via ORAL
  Filled 2016-08-16 (×4): qty 2

## 2016-08-16 MED ORDER — TAMSULOSIN HCL 0.4 MG PO CAPS
0.4000 mg | ORAL_CAPSULE | Freq: Every day | ORAL | Status: DC
  Administered 2016-08-16 – 2016-08-19 (×4): 0.4 mg via ORAL
  Filled 2016-08-16 (×4): qty 1

## 2016-08-16 MED ORDER — SODIUM CHLORIDE 0.9 % IV BOLUS (SEPSIS)
2500.0000 mL | Freq: Once | INTRAVENOUS | Status: AC
  Administered 2016-08-16: 2500 mL via INTRAVENOUS

## 2016-08-16 MED ORDER — SODIUM CHLORIDE 0.9% FLUSH
3.0000 mL | Freq: Two times a day (BID) | INTRAVENOUS | Status: DC
  Administered 2016-08-16 – 2016-08-20 (×7): 3 mL via INTRAVENOUS

## 2016-08-16 MED ORDER — SODIUM CHLORIDE 0.9 % IV BOLUS (SEPSIS): 500.0000 mL | Freq: Once | INTRAVENOUS | Status: DC

## 2016-08-16 MED ORDER — POTASSIUM CHLORIDE CRYS ER 20 MEQ PO TBCR
40.0000 meq | EXTENDED_RELEASE_TABLET | Freq: Once | ORAL | Status: AC
  Administered 2016-08-16: 40 meq via ORAL
  Filled 2016-08-16: qty 2

## 2016-08-16 MED ORDER — LIP MEDEX EX OINT
TOPICAL_OINTMENT | Freq: Once | CUTANEOUS | Status: AC
  Administered 2016-08-16: 20:00:00 via TOPICAL
  Filled 2016-08-16: qty 7

## 2016-08-16 MED ORDER — DEXTROSE 5 % IV SOLN
2.0000 g | INTRAVENOUS | Status: DC
  Administered 2016-08-17: 2 g via INTRAVENOUS
  Filled 2016-08-16: qty 2

## 2016-08-16 MED ORDER — SIMVASTATIN 10 MG PO TABS
20.0000 mg | ORAL_TABLET | Freq: Every evening | ORAL | Status: DC
  Administered 2016-08-16 – 2016-08-19 (×4): 20 mg via ORAL
  Filled 2016-08-16: qty 1
  Filled 2016-08-16 (×3): qty 2

## 2016-08-16 NOTE — H&P (Signed)
History and Physical  LAWYER BUSTER [id number] DOB: 07-Mar-1941 DOA: 08/16/2016  Referring physician: Margarita Mail, ER PA  PCP: Henrine Screws, MD  Outpatient Specialists: none Patient coming from: home & is able to ambulate without assistance  Chief Complaint: confusion   HPI: [name] is a 76 y.o. male with medical history significant of CLL, hypertension and BPH who for the last week and a half has been having upper respiratory symptoms and was thought to perhaps have the flu. Patient's wife felt that while the upper respiratory symptoms were getting better, in the last few days, he has gotten weaker and then today, she noted him to be somewhat confused, oftentimes repeating himself or saying things that did not make sense. She spoke with the patient's daughter who is an internist who advised that the patient be brought into the emergency room.  ED Course: in the emergency room, patient had labs done noting hypokalemia with a potassium of 2.9, creatinine of 1.94 ( up from baseline of 1.3), albumin of 3 and a very large urinary tract infection. Patient also noted to have white count of 31, but does have a history of CLL. His differential was normal indicating no evidence of blast crisis.Chest x-ray was unremarkable. Lactic acid level was ordered, but due to a lab error was delayed. In the meantime, patient was presumed to have sepsis and was treated aggressively with IV fluids and antibiotics. After several liters, lactic acid level was then checked at this point and still found to be elevated at 2.3. Hospitalists were called for further evaluation and admission.  Of note, patient was noted to have a crusted over rash at the base of his neck. Emergency room was concerned that possibly her shingles is given a dose of Valtrex  Review of Systems: Patient seen in the emergency room . Pt complains of feeling very weak, cough and very tired. Patient complained of urinary frequency.  He was somewhat delirious and repeating himself, so it was difficult to get a very accurate review of systems at times.  Although he had denied any loose stools or diarrhea, when we helped him take off his pants, he was noted to have significantly loose stool in his underwear.  Pt denies any headaches, vision changes, dysphagia, chest pain, palpitations, shortness of breath, wheeze, abdominal pain, hematuria, constipation, diarrhea, focal extremity numbness weakness or pain .  Review of systems are otherwise negative   Past Medical History:  Diagnosis Date  . Arthritis   . GERD (gastroesophageal reflux disease)   . Hepatitis 1966   Drug reaction after taking medication  . Hyperlipemia   . Hypertension   . Lung nodule seen on imaging study    bilateral lungs   Past Surgical History:  Procedure Laterality Date  . APPENDECTOMY    . arthroscoyp  2000   left knee  . COLONOSCOPY W/ POLYPECTOMY    . LUMBAR LAMINECTOMY/DECOMPRESSION MICRODISCECTOMY  06/27/2012   Procedure: LUMBAR LAMINECTOMY/DECOMPRESSION MICRODISCECTOMY 1 LEVEL;  Surgeon: [name], MD;  Location: [facility];  Service: Neurosurgery;  Laterality: Bilateral;  bilateral four-five laminectony  . SKIN CANCER EXCISION  5-6 years ago   moe-s surgery- basal b  . TONSILLECTOMY      Social History:  reports that he has never smoked. He has never used smokeless tobacco. He reports that he drinks about 1.2 oz of alcohol per week . He reports that he does not use drugs.   Allergies  Allergen Reactions  .  Dust Mite Extract Other (See Comments)    Itchy, watery eyes     Family History  Problem Relation Age of Onset  . Sudden death Mother 11    possible botulism  . Dementia Father   . Seizures Sister       Prior to Admission medications   Medication Sig Start Date End Date Taking? Authorizing Provider  aspirin EC 81 MG tablet Take 81 mg by mouth daily.   Yes Historical Provider, MD  bisoprolol-hydrochlorothiazide  (ZIAC) 10-6.25 MG per tablet Take 1 tablet by mouth daily.   Yes Historical Provider, MD  EPINEPHrine (EPIPEN) 0.3 mg/0.3 mL DEVI Inject 0.3 mLs (0.3 mg total) into the muscle once. Patient taking differently: Inject 0.3 mg into the muscle once as needed (for severe allergic reaction).  05/26/12  Yes Ryan M Dunn, PA-C  ibuprofen (ADVIL,MOTRIN) 200 MG tablet Take 200-800 mg by mouth every 6 (six) hours as needed for headache, mild pain or moderate pain.    Yes Historical Provider, MD  loratadine (CLARITIN) 10 MG tablet Take 10 mg by mouth daily.   Yes Historical Provider, MD  simvastatin (ZOCOR) 40 MG tablet Take 20 mg by mouth every evening.   Yes Historical Provider, MD  Tamsulosin HCl (FLOMAX) 0.4 MG CAPS Take 1 capsule (0.4 mg total) by mouth daily. Patient taking differently: Take 0.4 mg by mouth daily after supper.  06/30/12  Yes Jovita Gamma, MD    Physical Exam: BP 148/81 (BP Location: Right Arm)   Pulse 103   Temp 98.4 F (36.9 C) (Oral)   Resp 20   Ht 5\' 4"  (1.626 m)   Wt 81.2 kg (179 lb)   SpO2 100%   BMI 30.73 kg/m   General:  Alert and oriented 2, mild distress secondary to overall not feeling well  Eyes: sclera nonicteric, extra ocular movements are intact  ENT: normocephalic, atraumatic, mucous hemorrhage or dry  Neck: supple, no JVD  Cardiovascular: regular rhythm, borderline tachycardia  Respiratory: clear to auscultation bilaterally  Abdomen: soft, obese, nontender, hypoactive bowel sounds  Skin: patient has a small crusted rash that spans about 1 inch at the base of the left side of his neck. It does not extend elsewhere. It is nontender. Patient's wife said she did not notice it before today. Patient denies any pain past or present regards to this.  Musculoskeletal: no clubbing or cyanosis or edema  Psychiatric: patient is delirious  Neurologic: no focal deficits           Labs on Admission:  Basic Metabolic Panel:  Recent Labs Lab 08/16/16 1344  NA 131*   K 2.9*  CL 100*  CO2 21*  GLUCOSE 183*  BUN 23*  CREATININE 1.94*  CALCIUM 7.9*   Liver Function Tests:  Recent Labs Lab 08/16/16 1344  AST 36  ALT 41  ALKPHOS 115  BILITOT 1.4*  PROT 6.3*  ALBUMIN 3.0*   No results for input(s): LIPASE, AMYLASE in the last 168 hours. No results for input(s): AMMONIA in the last 168 hours. CBC:  Recent Labs Lab 08/16/16 1344  WBC 33.7*  NEUTROABS 23.0*  HGB 12.3*  HCT 36.3*  MCV 88.5  PLT 177   Cardiac Enzymes: No results for input(s): CKTOTAL, CKMB, CKMBINDEX, TROPONINI in the last 168 hours.  BNP (last 3 results) No results for input(s): BNP in the last 8760 hours.  ProBNP (last 3 results) No results for input(s): PROBNP in the last 8760 hours.  CBG: No results for  input(s): GLUCAP in the last 168 hours.  Radiological Exams on Admission: Dg Chest 2 View  Result Date: 08/16/2016 CLINICAL DATA:  Fever and cough. EXAM: CHEST  2 VIEW COMPARISON:  08/23/2012 FINDINGS: The heart is upper limits of normal in size and stable. There is mild tortuosity of the thoracic aorta. Low lung volumes but no infiltrates, edema or effusions. The bony thorax is intact. IMPRESSION: No acute cardiopulmonary findings. Electronically Signed   By: Marijo Sanes M.D.   On: 08/16/2016 14:31    EKG: Independently reviewed. Sinus rhythm with prolonged QT of 488, left atrial enlargement   Assessment/Plan Present on Admission: . Hypertension: Holding antihypertensives secondary to sepsis .  Acute encephalopathy: Patient is acutely delirious. His baseline is full normal mentation. Oh flu with treating of infection and fluid resuscitation, this should improve  . AKI (acute kidney injury) ([place]) in the setting of stage II chronic kidney disease: Patient's creatinine around 1.3 at least from labs 2 years ago. Looks a little bit more dry now and creatinine today at 1.9. Likely due to hypotension from sepsis. Aggressively fluid resuscitating. . Hypokalemia:  Replacing . Sepsis ([place]) secondary to urinary tract infection: Patient meets criteria for sepsis given tachycardia, lactic acidosis, leukocytosis, end organ damage and urinary source. Aggressively fluid resuscitating, IV antibiotics. Chest x-ray and lung exam unrevealing, but patient does seem to have persistent cough so we'll check ABG as well. Given his CLL, may be difficult to follow white blood cell count, but is higher than has been in the past.  Rash: Do not think that this is shingles given lack of pain and sudden onset. May be more dermatitis. Watch closely.  Marland Kitchen CLL (chronic lymphocytic leukemia) ([place]): White blood cell count higher than it has been in the past. Fortunately, differential okay, no signs of blast crisis  BPH: Continue Flomax. No evidence of urinary obstruction as patient is able to void  Principal Problem:   Sepsis ([place]) Active Problems:   CLL (chronic lymphocytic leukemia) ([facility])   Hypertension   UTI (urinary tract infection)   AKI (acute kidney injury) ([place])   Hypokalemia   DVT prophylaxis: Lovenox   Code Status: full code as per wife   Family Communication: spoke with daughter by phone. Wife at the bedside   Disposition Plan: placed in stepdown unit, patient will be here for several days   Consults called: none   Admission status: given patient's need for acute in-hospital services and will be here well past 2 minutes, have admitted him under inpatient     Annita Brod MD [facility] Pager [phone]  If 7PM-7AM, please contact night-coverage [email or web address] Password [facility]  08/16/2016, 7:24 PM

## 2016-08-16 NOTE — ED Provider Notes (Signed)
Medical screening examination/treatment/procedure(s) were conducted as a shared visit with non-physician practitioner(s) and myself.  I personally evaluated the patient during the encounter.   EKG Interpretation None     Patient here with confusion 2-3 days as well as burning after he urinates. Urinalysis positive for infection. Will start on IV antibiotics and admitted to the hospital.   Lacretia Leigh, MD 08/16/16 1536

## 2016-08-16 NOTE — ED Triage Notes (Signed)
Per EMS, pt wife concerned pt is confused for past 2-3 days. Pt also had urinary incontinence today, which is not normal for patient. Pt reports burning after urination. Pt is A&Ox4 for EMS.   Pt was diagnosed with flu and prescribed Tamiflu on 2/18. Pt reports feeling better but began coughing and feeling chills 2-3 days ago. Pt took dayquil earlier today. Pt had fever of 101, was given 1000mg  tylenol and 150cc NS en route.

## 2016-08-16 NOTE — ED Notes (Signed)
attempted to call report but  Charged nurse unable to take pt right now until shift changed.

## 2016-08-16 NOTE — ED Notes (Signed)
Bed: [id number] Expected date:  Expected time:  Means of arrival:  Comments: 76 yo confused, fever

## 2016-08-16 NOTE — ED Provider Notes (Signed)
[facility] DEPT Provider Note   CSN: [id number] Arrival date & time: 08/16/16  1321     History   Chief Complaint Chief Complaint  Patient presents with  . Altered Mental Status  . Fever    HPI [name] is a 76 y.o. male.With a past medical history of CLL, hyperlipidemia, hypertension, reflux and recent diagnosis of the flu on 08/07/2016 who presents for altered mental status. History is given by the patient and his wife. According to the patient for the last 10 days he has had intermittent fever, chills and diaphoresis including night sweats. His wife states that she has never seen him this sick before. He saw his physician and a few days ago because he just wasn't improving much, but did not tell her the time that he was also having burning with urination. His wife states that he has had some incontinence which is unusual. He also developed a cough this morning. He endorses malaise and fatigue and feels weak when standing. He also has extremely dry mouth. This morning his wife thought his speech sounds somewhat slurred. She is unsure if this is due to his dry membranes. He has no other neurologic complaints and is alert and oriented 4. Chest pain or shortness of breath.  HPI  Past Medical History:  Diagnosis Date  . Arthritis   . GERD (gastroesophageal reflux disease)   . Hepatitis 1966   Drug reaction after taking medication  . Hyperlipemia   . Hypertension   . Lung nodule seen on imaging study    bilateral lungs    Patient Active Problem List   Diagnosis Date Noted  . CLL (chronic lymphocytic leukemia) ([place]) 12/16/2014  . Dyspnea 09/20/2012    Past Surgical History:  Procedure Laterality Date  . APPENDECTOMY    . arthroscoyp  2000   left knee  . COLONOSCOPY W/ POLYPECTOMY    . LUMBAR LAMINECTOMY/DECOMPRESSION MICRODISCECTOMY  06/27/2012   Procedure: LUMBAR LAMINECTOMY/DECOMPRESSION MICRODISCECTOMY 1 LEVEL;  Surgeon: [name], MD;  Location: [facility];  Service: Neurosurgery;  Laterality: Bilateral;  bilateral four-five laminectony  . SKIN CANCER EXCISION  5-6 years ago   moe-s surgery- basal b  . TONSILLECTOMY         Home Medications    Prior to Admission medications   Medication Sig Start Date End Date Taking? Authorizing Provider  aspirin EC 81 MG tablet Take 81 mg by mouth daily.   Yes Historical Provider, MD  bisoprolol-hydrochlorothiazide (ZIAC) 10-6.25 MG per tablet Take 1 tablet by mouth daily.   Yes Historical Provider, MD  EPINEPHrine (EPIPEN) 0.3 mg/0.3 mL DEVI Inject 0.3 mLs (0.3 mg total) into the muscle once. Patient taking differently: Inject 0.3 mg into the muscle once as needed (for severe allergic reaction).  05/26/12  Yes Ryan M Dunn, PA-C  ibuprofen (ADVIL,MOTRIN) 200 MG tablet Take 200-800 mg by mouth every 6 (six) hours as needed for headache, mild pain or moderate pain.    Yes Historical Provider, MD  loratadine (CLARITIN) 10 MG tablet Take 10 mg by mouth daily.   Yes Historical Provider, MD  simvastatin (ZOCOR) 40 MG tablet Take 20 mg by mouth every evening.   Yes Historical Provider, MD  Tamsulosin HCl (FLOMAX) 0.4 MG CAPS Take 1 capsule (0.4 mg total) by mouth daily. Patient taking differently: Take 0.4 mg by mouth daily after supper.  06/30/12  Yes Jovita Gamma, MD    Family History Family History  Problem Relation Age of  Onset  . Sudden death Mother 67    possible botulism  . Dementia Father   . Seizures Sister     Social History Social History  Substance Use Topics  . Smoking status: Never Smoker  . Smokeless tobacco: Never Used  . Alcohol use 1.2 oz/week    2 Cans of beer per week     Comment: rare 2 beers per month     Allergies   Dust mite extract   Review of Systems Review of Systems  Ten systems reviewed and are negative for acute change, except as noted in the HPI.   Physical Exam Updated Vital Signs BP 141/68   Pulse 95   Temp 98.4 F (36.9 C) (Oral)   Resp 24    SpO2 98%   Physical Exam  Constitutional: He is oriented to person, place, and time. He appears well-developed and well-nourished. No distress.  HENT:  Head: Normocephalic and atraumatic.  Dry mucosa  Eyes: Conjunctivae and EOM are normal. Pupils are equal, round, and reactive to light. No scleral icterus.  Neck: Normal range of motion. Neck supple.  Cardiovascular: Normal rate, regular rhythm, normal heart sounds and intact distal pulses.   Pulmonary/Chest: Effort normal and breath sounds normal. No respiratory distress.  tachypnea  Abdominal: Soft. There is no tenderness.  Musculoskeletal: He exhibits no edema.  Neurological: He is alert and oriented to person, place, and time.  Skin: Skin is warm. Capillary refill takes less than 2 seconds. He is diaphoretic.  Psychiatric: His behavior is normal.  Nursing note and vitals reviewed.    ED Treatments / Results  Labs (all labs ordered are listed, but only abnormal results are displayed) Labs Reviewed  CULTURE, BLOOD (ROUTINE X 2)  CULTURE, BLOOD (ROUTINE X 2)  COMPREHENSIVE METABOLIC PANEL  CBC WITH DIFFERENTIAL/PLATELET  URINALYSIS, ROUTINE W REFLEX MICROSCOPIC  PROTIME-INR  I-STAT CG4 LACTIC ACID, ED  I-STAT TROPOININ, ED    EKG  EKG Interpretation None       Radiology No results found.  Procedures Procedures (including critical care time)  Medications Ordered in ED Medications - No data to display   Initial Impression / Assessment and Plan / ED Course  I have reviewed the triage vital signs and the nursing notes.  Pertinent labs & imaging results that were available during my care of the patient were reviewed by me and considered in my medical decision making (see chart for details).  Clinical Course as of Aug 17 1511  Tue Aug 16, 2016  1511 Patient with UTI  [AH]  1512 Hypokalemia Potassium: (!) 2.9 [AH]    Clinical Course User Index [AH] Margarita Mail, PA-C    Patient with urinary tract  infection, fever and diaphoresis. Patient will be admitted for treatment. He is a significantly elevated white blood cell count with a history of CLL, no blasts conversion noted on differential.  Final Clinical Impressions(s) / ED Diagnoses   Final diagnoses:  Urinary tract infection with hematuria, site unspecified  Herpes zoster without complication  AKI (acute kidney injury) ([place])  Hyperglycemia    New Prescriptions New Prescriptions   No medications on file     Margarita Mail, PA-C 08/17/16 2331

## 2016-08-16 NOTE — Progress Notes (Signed)
Pharmacy Antibiotic Note  [name] is a 76 y.o. male admitted on 08/16/2016 with sepsis secondary to UTI.  Pharmacy has been consulted for Ceftriaxone dosing.  Plan: -Ceftriaxone 2g IV q24h.   -Pharmacy will sign off at this time, as Ceftriaxone does not require renal/hepatic adjustment.  Height: 5\' 4"  (162.6 cm) Weight: 179 lb (81.2 kg) IBW/kg (Calculated) : 59.2  Temp (24hrs), Avg:98.4 F (36.9 C), Min:98.4 F (36.9 C), Max:98.4 F (36.9 C)   Recent Labs Lab 08/16/16 1344  WBC 33.7*  CREATININE 1.94*    Estimated Creatinine Clearance: 31.6 mL/min (by C-G formula based on SCr of 1.94 mg/dL (H)).    Allergies  Allergen Reactions  . Dust Mite Extract Other (See Comments)    Itchy, watery eyes     Antimicrobials this admission: 2/27 >> Ceftriaxone >>  Dose adjustments this admission: --  Microbiology results: 2/27 BCx: sent 2/27 UCx: sent    Thank you for allowing pharmacy to be a part of this patient's care.    Lindell Spar, PharmD, BCPS Pager: [phone] 08/16/2016 3:33 PM

## 2016-08-17 ENCOUNTER — Inpatient Hospital Stay (HOSPITAL_COMMUNITY): Payer: Medicare Other

## 2016-08-17 DIAGNOSIS — N390 Urinary tract infection, site not specified: Secondary | ICD-10-CM | POA: Diagnosis present

## 2016-08-17 DIAGNOSIS — R319 Hematuria, unspecified: Secondary | ICD-10-CM

## 2016-08-17 DIAGNOSIS — G9340 Encephalopathy, unspecified: Secondary | ICD-10-CM

## 2016-08-17 DIAGNOSIS — R739 Hyperglycemia, unspecified: Secondary | ICD-10-CM

## 2016-08-17 DIAGNOSIS — R7309 Other abnormal glucose: Secondary | ICD-10-CM

## 2016-08-17 HISTORY — DX: Encephalopathy, unspecified: G93.40

## 2016-08-17 LAB — BASIC METABOLIC PANEL
Anion gap: 8 (ref 5–15)
Anion gap: 9 (ref 5–15)
BUN: 23 mg/dL — ABNORMAL HIGH (ref 6–20)
BUN: 23 mg/dL — ABNORMAL HIGH (ref 6–20)
CO2: 19 mmol/L — ABNORMAL LOW (ref 22–32)
CO2: 18 mmol/L — ABNORMAL LOW (ref 22–32)
Calcium: 7.2 mg/dL — ABNORMAL LOW (ref 8.9–10.3)
Calcium: 7.1 mg/dL — ABNORMAL LOW (ref 8.9–10.3)
Chloride: 108 mmol/L (ref 101–111)
Chloride: 105 mmol/L (ref 101–111)
Creatinine, Ser: 2.07 mg/dL — ABNORMAL HIGH (ref 0.61–1.24)
Creatinine, Ser: 2.04 mg/dL — ABNORMAL HIGH (ref 0.61–1.24)
GFR calc Af Amer: 34 mL/min — ABNORMAL LOW (ref >60)
GFR calc Af Amer: 35 mL/min — ABNORMAL LOW (ref >60)
GFR calc non Af Amer: 30 mL/min — ABNORMAL LOW (ref >60)
GFR calc non Af Amer: 30 mL/min — ABNORMAL LOW (ref >60)
Glucose, Bld: 149 mg/dL — ABNORMAL HIGH (ref 65–99)
Glucose, Bld: 166 mg/dL — ABNORMAL HIGH (ref 65–99)
Potassium: 3.2 mmol/L — ABNORMAL LOW (ref 3.5–5.1)
Potassium: 3.5 mmol/L (ref 3.5–5.1)
Sodium: 135 mmol/L (ref 135–145)
Sodium: 132 mmol/L — ABNORMAL LOW (ref 135–145)

## 2016-08-17 LAB — BLOOD CULTURE ID PANEL (REFLEXED)

## 2016-08-17 LAB — CBC
HCT: 32.9 % — ABNORMAL LOW (ref 39.0–52.0)
Hemoglobin: 11.1 g/dL — ABNORMAL LOW (ref 13.0–17.0)
MCH: 29.9 pg (ref 26.0–34.0)
MCHC: 33.7 g/dL (ref 30.0–36.0)
MCV: 88.7 fL (ref 78.0–100.0)
Platelets: 164 10*3/uL (ref 150–400)
RBC: 3.71 MIL/uL — ABNORMAL LOW (ref 4.22–5.81)
RDW: 13 % (ref 11.5–15.5)
WBC: 37.1 10*3/uL — ABNORMAL HIGH (ref 4.0–10.5)

## 2016-08-17 LAB — BRAIN NATRIURETIC PEPTIDE: B Natriuretic Peptide: 217.4 pg/mL — ABNORMAL HIGH (ref 0.0–100.0)

## 2016-08-17 MED ORDER — ENOXAPARIN SODIUM 30 MG/0.3ML ~~LOC~~ SOLN
30.0000 mg | SUBCUTANEOUS | Status: DC
  Administered 2016-08-17 – 2016-08-19 (×2): 30 mg via SUBCUTANEOUS
  Filled 2016-08-17 (×2): qty 0.3

## 2016-08-17 MED ORDER — POTASSIUM CHLORIDE CRYS ER 20 MEQ PO TBCR
30.0000 meq | EXTENDED_RELEASE_TABLET | Freq: Once | ORAL | Status: AC
  Administered 2016-08-17: 30 meq via ORAL
  Filled 2016-08-17: qty 1

## 2016-08-17 MED ORDER — BISOPROLOL FUMARATE 5 MG PO TABS
10.0000 mg | ORAL_TABLET | Freq: Every day | ORAL | Status: DC
  Administered 2016-08-17 – 2016-08-20 (×4): 10 mg via ORAL
  Filled 2016-08-17 (×4): qty 2

## 2016-08-17 MED ORDER — SODIUM BICARBONATE 650 MG PO TABS
650.0000 mg | ORAL_TABLET | Freq: Two times a day (BID) | ORAL | Status: DC
  Administered 2016-08-17 (×2): 650 mg via ORAL
  Filled 2016-08-17 (×2): qty 1

## 2016-08-17 MED ORDER — GUAIFENESIN-DM 100-10 MG/5ML PO SYRP
5.0000 mL | ORAL_SOLUTION | ORAL | Status: DC | PRN
  Administered 2016-08-17 (×2): 5 mL via ORAL
  Filled 2016-08-17 (×2): qty 10

## 2016-08-17 NOTE — Progress Notes (Signed)
Per MD patient may travel to xray without nurse. Vital signs stable.

## 2016-08-17 NOTE — Progress Notes (Signed)
PHARMACY - PHYSICIAN COMMUNICATION CRITICAL VALUE ALERT - BLOOD CULTURE IDENTIFICATION (BCID)  Results for orders placed or performed during the hospital encounter of 08/16/16  Blood Culture ID Panel (Reflexed) (Collected: 08/16/2016  1:42 PM)  Result Value Ref Range   Enterococcus species NOT DETECTED NOT DETECTED   Listeria monocytogenes NOT DETECTED NOT DETECTED   Staphylococcus species NOT DETECTED NOT DETECTED   Staphylococcus aureus NOT DETECTED NOT DETECTED   Streptococcus species NOT DETECTED NOT DETECTED   Streptococcus agalactiae NOT DETECTED NOT DETECTED   Streptococcus pneumoniae NOT DETECTED NOT DETECTED   Streptococcus pyogenes NOT DETECTED NOT DETECTED   Acinetobacter baumannii NOT DETECTED NOT DETECTED   Enterobacteriaceae species DETECTED (A) NOT DETECTED   Enterobacter cloacae complex NOT DETECTED NOT DETECTED   Escherichia coli NOT DETECTED NOT DETECTED   Klebsiella oxytoca NOT DETECTED NOT DETECTED   Klebsiella pneumoniae NOT DETECTED NOT DETECTED   Proteus species NOT DETECTED NOT DETECTED   Serratia marcescens NOT DETECTED NOT DETECTED   Carbapenem resistance NOT DETECTED NOT DETECTED   Haemophilus influenzae NOT DETECTED NOT DETECTED   Neisseria meningitidis NOT DETECTED NOT DETECTED   Pseudomonas aeruginosa NOT DETECTED NOT DETECTED   Candida albicans NOT DETECTED NOT DETECTED   Candida glabrata NOT DETECTED NOT DETECTED   Candida krusei NOT DETECTED NOT DETECTED   Candida parapsilosis NOT DETECTED NOT DETECTED   Candida tropicalis NOT DETECTED NOT DETECTED    Name of physician (or Provider) Contacted: Dr. Carles Collet  Changes to prescribed antibiotics required: none, continue Ceftriaxone 2g IV q24h  [name] 08/17/2016  1:04 PM

## 2016-08-17 NOTE — Progress Notes (Addendum)
Came to see pt.  Pt's nurse called due to concern for tachycardia and dyspnea.  SBP up to 180s and HR 115. Afebrile. 97-98% on RA Pt states he is not much more dyspneic than this am.  Pt denies chest discomfort, dizziness, n/v/d, abd pain  CV-RRR, no rub, no JVD Lung-CTA ABd--soft/NT+BS Ext--no edema  EKG--sinus tach, nonspecific T-wave changes  Tachycardia--likely rebound from being off of bisoprolol-->restart Dyspnea--likely due to metabolic acidosis--CXR neg for infiltrate or edema       --repeat BMP; if still significant metabolic acidosis, may need IV bicarb Blood culture--GNR Son-in-law (an internist) updated  Total time 31 min--1650--1721  DTat

## 2016-08-17 NOTE — Care Management Note (Signed)
Case Management Note  Patient Details  Name: [name] MRN: [id number] Date of Birth: [birth year]  Subjective/Objective:   urosepsis                 Action/Plan: home with spouse   Expected Discharge Date:   (unknown)               Expected Discharge Plan:  Home/Self Care  In-House Referral:     Discharge planning Services     Post Acute Care Choice:    Choice offered to:     DME Arranged:    DME Agency:     HH Arranged:    [place] Agency:     Status of Service:  Completed, signed off  If discussed at [employer] of Stay Meetings, dates discussed:    Additional Comments:  Leeroy Cha, RN 08/17/2016, 8:52 AM

## 2016-08-17 NOTE — Progress Notes (Addendum)
PROGRESS NOTE  [name] [id number] DOB: January 18, 1941 DOA: 08/16/2016 PCP: Henrine Screws, MD  Brief History:  76 year old male with a history of CLL, hypertension, hyperlipidemia presenting with confusion. The patient had been suffering with URI type symptoms with coughing and congestion for 1-1/2 weeks. The patient was prescribed oseltamivir by his primary care provider which he finished on 08/13/2016. Notably, his cough and congestion have improved, but his wife noted patient to be febrile and more confused with increasing generalized weakness. As result, the patient was brought to the hospital for further evaluation. Upon presentation the patient was noted to have WBC 39.2 with lactic acid of 2.3 and serum creatinine 1.94. Because there was lab error, the lactic acid was actually obtained after receiving fluid resuscitation. Urinalysis showed TNTC WBC. The patient was started on intravenous ceftriaxone after blood and urine cultures were obtained.  Assessment/Plan: Sepsis -Secondary to UTI -continue ceftriaxone pending urine and blood cultures -PCT 4.70 -continue IVF -Lactate 2.3>>>1.4  UTI -continue ceftriaxone pending urine culture data  Acute kidney injury -Secondary to sepsis -Baseline creatinine 1.1-1.3 -Renal ultrasound  Acute metabolic encephalopathy -Secondary to sepsis and acute kidney injury -Improving with antibiotics and fluid hydration  Dyspnea -secondary to metabolic acidosis -personally reviewed CXR--negative for infiltrate or edema -start bicarbonate  Hypokalemia -Replete -Check magnesium  CLL -differential does not suggest leukemic conversion -partly contributing to leukemoid reaction  Essential HTN -holding Z[place] due to soft BPs  Hyperlipidemia -continue statin  BPH -continue flomax    Disposition Plan:   Home in 2-3 days  Family Communication:   Spouse  updated at bedside--Total time spent 40 minutes.  Greater than 50%  spent face to face counseling and coordinating care.   Consultants:  none  Code Status:  FULL  DVT Prophylaxis:[place] Lovenox   Procedures: As Listed in Progress Note Above  Antibiotics: Ceftriaxone 08/16/16>>>    Subjective: Still has some dysuria but improving.  Patient denies fevers, chills, headache, chest pain, dyspnea, nausea, vomiting, diarrhea, abdominal pain, hematuria, hematochezia, and melena.   Objective: Vitals:   08/17/16 0555 08/17/16 0600 08/17/16 0700 08/17/16 0800  BP:  135/62  (!) 157/105  Pulse:  86 94 (!) 103  Resp:  (!) 32 (!) 33 (!) 29  Temp: 98.8 F (37.1 C)  (!) 102.3 F (39.1 C)   TempSrc: Oral  Oral   SpO2:  96% 97% 99%  Weight:      Height:        Intake/Output Summary (Last 24 hours) at 08/17/16 0913 Last data filed at 08/17/16 0800  Gross per 24 hour  Intake             1095 ml  Output              100 ml  Net              995 ml   Weight change:  Exam:   General:  Pt is alert, follows commands appropriately, not in acute distress  HEENT: No icterus, No thrush, No neck mass, [place]/AT  Cardiovascular: RRR, S1/S2, no rubs, no gallops  Respiratory: CTA bilaterally, no wheezing, no crackles, no rhonchi  Abdomen: Soft/+BS, non tender, non distended, no guarding  Extremities: No edema, No lymphangitis, No petechiae, No rashes, no synovitis   Data Reviewed: I have personally reviewed following labs and imaging studies Basic Metabolic Panel:  Recent Labs Lab 08/16/16 1344 08/16/16 2135 08/17/16 0311  NA 131*  --  135  K 2.9*  --  3.2*  CL 100*  --  108  CO2 21*  --  19*  GLUCOSE 183*  --  149*  BUN 23*  --  23*  CREATININE 1.94* 1.56* 2.07*  CALCIUM 7.9*  --  7.2*   Liver Function Tests:  Recent Labs Lab 08/16/16 1344  AST 36  ALT 41  ALKPHOS 115  BILITOT 1.4*  PROT 6.3*  ALBUMIN 3.0*   No results for input(s): LIPASE, AMYLASE in the last 168 hours. No results for input(s): AMMONIA in the last 168  hours. Coagulation Profile:  Recent Labs Lab 08/16/16 1344  INR 1.13   CBC:  Recent Labs Lab 08/16/16 1344 08/16/16 2135 08/17/16 0311  WBC 33.7* 39.2* 37.1*  NEUTROABS 23.0*  --   --   HGB 12.3* 10.6* 11.1*  HCT 36.3* 31.1* 32.9*  MCV 88.5 89.1 88.7  PLT 177 166 164   Cardiac Enzymes: No results for input(s): CKTOTAL, CKMB, CKMBINDEX, TROPONINI in the last 168 hours. BNP: Invalid input(s): POCBNP CBG: No results for input(s): GLUCAP in the last 168 hours. HbA1C: No results for input(s): HGBA1C in the last 72 hours. Urine analysis:    Component Value Date/Time   COLORURINE AMBER (A) 08/16/2016 1353   APPEARANCEUR CLOUDY (A) 08/16/2016 1353   LABSPEC 1.015 08/16/2016 1353   PHURINE 5.0 08/16/2016 1353   GLUCOSEU NEGATIVE 08/16/2016 1353   HGBUR MODERATE (A) 08/16/2016 1353   BILIRUBINUR NEGATIVE 08/16/2016 1353   KETONESUR NEGATIVE 08/16/2016 1353   PROTEINUR 100 (A) 08/16/2016 1353   NITRITE NEGATIVE 08/16/2016 1353   LEUKOCYTESUR LARGE (A) 08/16/2016 1353   Sepsis Labs: @LABRCNTIP (procalcitonin:4,lacticidven:4) ) Recent Results (from the past 240 hour(s))  MRSA PCR Screening     Status: None   Collection Time: 08/16/16  8:42 PM  Result Value Ref Range Status   MRSA by PCR NEGATIVE NEGATIVE Final    Comment:        The GeneXpert MRSA Assay (FDA approved for NASAL specimens only), is one component of a comprehensive MRSA colonization surveillance program. It is not intended to diagnose MRSA infection nor to guide or monitor treatment for MRSA infections.      Scheduled Meds: . cefTRIAXone (ROCEPHIN)  IV  2 g Intravenous Q24H  . enoxaparin (LOVENOX) injection  30 mg Subcutaneous Q24H  . simvastatin  20 mg Oral QPM  . sodium chloride flush  3 mL Intravenous Q12H  . tamsulosin  0.4 mg Oral QPC supper  . valACYclovir  1,000 mg Oral TID   Continuous Infusions: . sodium chloride 100 mL/hr at 08/17/16 0800    Procedures/Studies: Dg Chest 2  View  Result Date: 08/16/2016 CLINICAL DATA:  Fever and cough. EXAM: CHEST  2 VIEW COMPARISON:  08/23/2012 FINDINGS: The heart is upper limits of normal in size and stable. There is mild tortuosity of the thoracic aorta. Low lung volumes but no infiltrates, edema or effusions. The bony thorax is intact. IMPRESSION: No acute cardiopulmonary findings. Electronically Signed   By: Marijo Sanes M.D.   On: 08/16/2016 14:31    Shayana Hornstein, DO  [facility] Pager [phone]  If 7PM-7AM, please contact night-coverage [email or web address] Password [facility] 08/17/2016, 9:13 AM   LOS: 1 day

## 2016-08-18 ENCOUNTER — Other Ambulatory Visit (HOSPITAL_COMMUNITY): Payer: Medicare Other

## 2016-08-18 DIAGNOSIS — A4150 Gram-negative sepsis, unspecified: Secondary | ICD-10-CM

## 2016-08-18 LAB — BASIC METABOLIC PANEL
Anion gap: 7 (ref 5–15)
BUN: 24 mg/dL — ABNORMAL HIGH (ref 6–20)
CO2: 20 mmol/L — ABNORMAL LOW (ref 22–32)
Calcium: 7.1 mg/dL — ABNORMAL LOW (ref 8.9–10.3)
Chloride: 108 mmol/L (ref 101–111)
Creatinine, Ser: 1.93 mg/dL — ABNORMAL HIGH (ref 0.61–1.24)
GFR calc Af Amer: 37 mL/min — ABNORMAL LOW (ref >60)
GFR calc non Af Amer: 32 mL/min — ABNORMAL LOW (ref >60)
Glucose, Bld: 110 mg/dL — ABNORMAL HIGH (ref 65–99)
Potassium: 3.2 mmol/L — ABNORMAL LOW (ref 3.5–5.1)
Sodium: 135 mmol/L (ref 135–145)

## 2016-08-18 LAB — CBC
HCT: 31.2 % — ABNORMAL LOW (ref 39.0–52.0)
Hemoglobin: 10.6 g/dL — ABNORMAL LOW (ref 13.0–17.0)
MCH: 30.2 pg (ref 26.0–34.0)
MCHC: 34 g/dL (ref 30.0–36.0)
MCV: 88.9 fL (ref 78.0–100.0)
Platelets: 160 10*3/uL (ref 150–400)
RBC: 3.51 MIL/uL — ABNORMAL LOW (ref 4.22–5.81)
RDW: 13.4 % (ref 11.5–15.5)
WBC: 31.5 10*3/uL — ABNORMAL HIGH (ref 4.0–10.5)

## 2016-08-18 LAB — C DIFFICILE QUICK SCREEN W PCR REFLEX
C Diff antigen: NEGATIVE
C Diff interpretation: NOT DETECTED
C Diff toxin: NEGATIVE

## 2016-08-18 LAB — PATHOLOGIST SMEAR REVIEW

## 2016-08-18 LAB — MAGNESIUM: Magnesium: 1.7 mg/dL (ref 1.7–2.4)

## 2016-08-18 MED ORDER — POTASSIUM CHLORIDE CRYS ER 20 MEQ PO TBCR
40.0000 meq | EXTENDED_RELEASE_TABLET | Freq: Once | ORAL | Status: AC
  Administered 2016-08-18: 40 meq via ORAL
  Filled 2016-08-18: qty 2

## 2016-08-18 MED ORDER — SODIUM BICARBONATE 650 MG PO TABS
1300.0000 mg | ORAL_TABLET | Freq: Two times a day (BID) | ORAL | Status: DC
  Administered 2016-08-18 – 2016-08-20 (×4): 1300 mg via ORAL
  Filled 2016-08-18 (×6): qty 2

## 2016-08-18 MED ORDER — SODIUM CHLORIDE 0.9 % IV SOLN
1.0000 g | Freq: Two times a day (BID) | INTRAVENOUS | Status: DC
  Administered 2016-08-18 – 2016-08-19 (×3): 1 g via INTRAVENOUS
  Filled 2016-08-18 (×3): qty 1

## 2016-08-18 MED ORDER — MAGNESIUM SULFATE 2 GM/50ML IV SOLN
2.0000 g | Freq: Once | INTRAVENOUS | Status: AC
  Administered 2016-08-18: 2 g via INTRAVENOUS
  Filled 2016-08-18: qty 50

## 2016-08-18 NOTE — Progress Notes (Signed)
Full report given to receiving nurse on % east. All belongings transferred with patient. PT VSS with no signs of acute distress. Per pt, he will let wife know about transfer "she probably wont answer her phone right now".

## 2016-08-18 NOTE — Progress Notes (Addendum)
PROGRESS NOTE  [name] [id number] DOB: 21-Jul-1940 DOA: 08/16/2016 PCP: Henrine Screws, MD  Brief History:  76 year old male with a history of CLL, hypertension, hyperlipidemia presenting with confusion. The patient had been suffering with URI type symptoms with coughing and congestion for 1-1/2 weeks. The patient was prescribed oseltamivir by his primary care provider which he finished on 08/13/2016. Notably, his cough and congestion have improved, but his wife noted patient to be febrile and more confused with increasing generalized weakness. As result, the patient was brought to the hospital for further evaluation. Upon presentation the patient was noted to have WBC 39.2 with lactic acid of 2.3 and serum creatinine 1.94. Because there was lab error, the lactic acid was actually obtained after receiving fluid resuscitation. Urinalysis showed TNTC WBC. The patient was started on intravenous ceftriaxone after blood and urine cultures were obtained.  Assessment/Plan: Sepsis -Secondary to UTI and bacteremia -continue ceftriaxone pending urine and blood cultures -PCT 4.70 -continue IVF-->saline lock -Lactate 2.3>>>1.4  Bacteremia--GNR -source is urine -prelim = Enterobacter due to propensity of MDR -broaden to merrem -fever is trending down  UTI -continue ceftriaxone pending urine culture data  Acute kidney injury -Secondary to sepsis -Baseline creatinine 1.1-1.3 -Renal ultrasound--neg for hydronephrosis -creatinine starting to trend down  Acute metabolic encephalopathy -Secondary to sepsis and acute kidney injury -Improved with antibiotics and fluid hydration  Dyspnea -secondary to metabolic acidosis -personally reviewed CXR--negative for infiltrate or edema -increase bicarbonate 1300 mg bid -check echo  Hypokalemia -Replete -Check magnesium--1.7  CLL -differential does not suggest leukemic conversion -partly contributing to leukemoid  reaction -am CBC with diff  Essential HTN -restart bisoprolol without HCTZ  Hyperlipidemia -continue statin  BPH -continue flomax    Disposition Plan:   Home likely 08/20/16  Family Communication:   Spouse  updated--Total time spent 35 minutes.  Greater than 50% spent face to face counseling and coordinating care.    Consultants:  none  Code Status:  FULL  DVT Prophylaxis:[place] Lovenox   Procedures: As Listed in Progress Note Above  Antibiotics: Ceftriaxone 08/16/16>>>    Subjective: Patient still reports some shortness of breath but it is improving. He slept very well last night without orthopnea or PND. Denies any headache, chest pain, nausea, vomiting, diarrhea, cough, hemoptysis, abdominal pain, dysuria, hematuria. No medication or melena. N  Objective: Vitals:   08/18/16 0500 08/18/16 0600 08/18/16 0700 08/18/16 0832  BP:  (!) 119/59  140/66  Pulse: 76 75 73 91  Resp: (!) 23 (!) 25 (!) 24 (!) 33  Temp:   98.6 F (37 C)   TempSrc:   Oral   SpO2: 99% 96% 96% 96%  Weight:      Height:        Intake/Output Summary (Last 24 hours) at 08/18/16 1035 Last data filed at 08/18/16 0700  Gross per 24 hour  Intake             2250 ml  Output              650 ml  Net             1600 ml   Weight change:  Exam:   General:  Pt is alert, follows commands appropriately, not in acute distress  HEENT: No icterus, No thrush, No neck mass, [place]/AT  Cardiovascular: RRR, S1/S2, no rubs, no gallops  Respiratory: CTA bilaterally, no wheezing, no crackles, no rhonchi  Abdomen: Soft/+BS, non tender,  non distended, no guarding  Extremities: No edema, No lymphangitis, No petechiae, No rashes, no synovitis   Data Reviewed: I have personally reviewed following labs and imaging studies Basic Metabolic Panel:  Recent Labs Lab 08/16/16 1344 08/16/16 2135 08/17/16 0311 08/17/16 1806 08/18/16 0348  NA 131*  --  135 132* 135  K 2.9*  --  3.2* 3.5 3.2*  CL  100*  --  108 105 108  CO2 21*  --  19* 18* 20*  GLUCOSE 183*  --  149* 166* 110*  BUN 23*  --  23* 23* 24*  CREATININE 1.94* 1.56* 2.07* 2.04* 1.93*  CALCIUM 7.9*  --  7.2* 7.1* 7.1*  MG  --   --   --   --  1.7   Liver Function Tests:  Recent Labs Lab 08/16/16 1344  AST 36  ALT 41  ALKPHOS 115  BILITOT 1.4*  PROT 6.3*  ALBUMIN 3.0*   No results for input(s): LIPASE, AMYLASE in the last 168 hours. No results for input(s): AMMONIA in the last 168 hours. Coagulation Profile:  Recent Labs Lab 08/16/16 1344  INR 1.13   CBC:  Recent Labs Lab 08/16/16 1344 08/16/16 2135 08/17/16 0311 08/18/16 0348  WBC 33.7* 39.2* 37.1* 31.5*  NEUTROABS 23.0*  --   --   --   HGB 12.3* 10.6* 11.1* 10.6*  HCT 36.3* 31.1* 32.9* 31.2*  MCV 88.5 89.1 88.7 88.9  PLT 177 166 164 160   Cardiac Enzymes: No results for input(s): CKTOTAL, CKMB, CKMBINDEX, TROPONINI in the last 168 hours. BNP: Invalid input(s): POCBNP CBG: No results for input(s): GLUCAP in the last 168 hours. HbA1C: No results for input(s): HGBA1C in the last 72 hours. Urine analysis:    Component Value Date/Time   COLORURINE AMBER (A) 08/16/2016 1353   APPEARANCEUR CLOUDY (A) 08/16/2016 1353   LABSPEC 1.015 08/16/2016 1353   PHURINE 5.0 08/16/2016 1353   GLUCOSEU NEGATIVE 08/16/2016 1353   HGBUR MODERATE (A) 08/16/2016 1353   BILIRUBINUR NEGATIVE 08/16/2016 1353   KETONESUR NEGATIVE 08/16/2016 1353   PROTEINUR 100 (A) 08/16/2016 1353   NITRITE NEGATIVE 08/16/2016 1353   LEUKOCYTESUR LARGE (A) 08/16/2016 1353   Sepsis Labs: @LABRCNTIP (procalcitonin:4,lacticidven:4) ) Recent Results (from the past 240 hour(s))  Culture, blood (Routine x 2)     Status: None (Preliminary result)   Collection Time: 08/16/16  1:42 PM  Result Value Ref Range Status   Specimen Description BLOOD RIGHT HAND  Final   Special Requests BOTTLES DRAWN AEROBIC AND ANAEROBIC 5 CC EA  Final   Culture  Setup Time   Final    GRAM NEGATIVE  RODS ANAEROBIC BOTTLE ONLY CRITICAL RESULT CALLED TO, READ BACK BY AND VERIFIED WITH: T. Green Pharm.D. 12:40 08/17/16 ([email or web address]) Performed at [facility], [place] [address], [place], [place] [address]    Culture GRAM NEGATIVE RODS  Final   Report Status PENDING  Incomplete  Blood Culture ID Panel (Reflexed)     Status: Abnormal   Collection Time: 08/16/16  1:42 PM  Result Value Ref Range Status   Enterococcus species NOT DETECTED NOT DETECTED Final   Listeria monocytogenes NOT DETECTED NOT DETECTED Final   Staphylococcus species NOT DETECTED NOT DETECTED Final   Staphylococcus aureus NOT DETECTED NOT DETECTED Final   Streptococcus species NOT DETECTED NOT DETECTED Final   Streptococcus agalactiae NOT DETECTED NOT DETECTED Final   Streptococcus pneumoniae NOT DETECTED NOT DETECTED Final   Streptococcus pyogenes NOT DETECTED NOT DETECTED Final  Acinetobacter baumannii NOT DETECTED NOT DETECTED Final   Enterobacteriaceae species DETECTED (A) NOT DETECTED Final    Comment: Enterobacteriaceae represent a large family of gram negative bacteria, not a single organism. Refer to culture for further identification. CRITICAL RESULT CALLED TO, READ BACK BY AND VERIFIED WITH: T. Green Pharm.D. 12:40 08/17/16 ([email or web address])    Enterobacter cloacae complex NOT DETECTED NOT DETECTED Final   Escherichia coli NOT DETECTED NOT DETECTED Final   Klebsiella oxytoca NOT DETECTED NOT DETECTED Final   Klebsiella pneumoniae NOT DETECTED NOT DETECTED Final   Proteus species NOT DETECTED NOT DETECTED Final   Serratia marcescens NOT DETECTED NOT DETECTED Final   Carbapenem resistance NOT DETECTED NOT DETECTED Final   Haemophilus influenzae NOT DETECTED NOT DETECTED Final   Neisseria meningitidis NOT DETECTED NOT DETECTED Final   Pseudomonas aeruginosa NOT DETECTED NOT DETECTED Final   Candida albicans NOT DETECTED NOT DETECTED Final   Candida glabrata NOT DETECTED NOT DETECTED Final   Candida krusei NOT  DETECTED NOT DETECTED Final   Candida parapsilosis NOT DETECTED NOT DETECTED Final   Candida tropicalis NOT DETECTED NOT DETECTED Final    Comment: Performed at [facility], [place] [address] [place], [place] [address]  Urine culture     Status: Abnormal (Preliminary result)   Collection Time: 08/16/16  1:53 PM  Result Value Ref Range Status   Specimen Description URINE, RANDOM  Final   Special Requests NONE  Final   Culture >=100,000 COLONIES/mL GRAM NEGATIVE RODS (A)  Final   Report Status PENDING  Incomplete  Culture, blood (Routine x 2)     Status: None (Preliminary result)   Collection Time: 08/16/16  2:17 PM  Result Value Ref Range Status   Specimen Description BLOOD LEFT HAND  Final   Special Requests BOTTLES DRAWN AEROBIC AND ANAEROBIC 5 CC EA  Final   Culture   Final    NO GROWTH < 24 HOURS Performed at [facility], [place] [address], [place],  [address]    Report Status PENDING  Incomplete  MRSA PCR Screening     Status: None   Collection Time: 08/16/16  8:42 PM  Result Value Ref Range Status   MRSA by PCR NEGATIVE NEGATIVE Final    Comment:        The GeneXpert MRSA Assay (FDA approved for NASAL specimens only), is one component of a comprehensive MRSA colonization surveillance program. It is not intended to diagnose MRSA infection nor to guide or monitor treatment for MRSA infections.      Scheduled Meds: . bisoprolol  10 mg Oral Daily  . cefTRIAXone (ROCEPHIN)  IV  2 g Intravenous Q24H  . enoxaparin (LOVENOX) injection  30 mg Subcutaneous Q24H  . magnesium sulfate 1 - 4 g bolus IVPB  2 g Intravenous Once  . potassium chloride  40 mEq Oral Once  . simvastatin  20 mg Oral QPM  . sodium bicarbonate  1,300 mg Oral BID  . sodium chloride flush  3 mL Intravenous Q12H  . tamsulosin  0.4 mg Oral QPC supper   Continuous Infusions:  Procedures/Studies: Dg Chest 2 View  Result Date: 08/17/2016 CLINICAL DATA:  Cough and chest congestion for the  past 2 weeks associated with dyspnea. History of chronic lymphocytic leukemia nonsmoker. EXAM: CHEST  2 VIEW COMPARISON:  Chest x-ray of August 16, 2016 and August 23, 2012. FINDINGS: The lungs are reasonably well inflated and clear there is a stable nodule just lateral to the  left cardiac apex unchanged since August 23, 2012. The heart and pulmonary vascularity are normal. The mediastinum is normal in width. There is no pleural effusion. There is calcification in the wall of the aortic arch. The bony thorax exhibits no acute abnormality. IMPRESSION: There is no pneumonia, CHF, nor other acute cardiopulmonary abnormality. Thoracic aortic atherosclerosis. Electronically Signed   By: Aby Gessel  [place] M.D.   On: 08/17/2016 13:04   Dg Chest 2 View  Result Date: 08/16/2016 CLINICAL DATA:  Fever and cough. EXAM: CHEST  2 VIEW COMPARISON:  08/23/2012 FINDINGS: The heart is upper limits of normal in size and stable. There is mild tortuosity of the thoracic aorta. Low lung volumes but no infiltrates, edema or effusions. The bony thorax is intact. IMPRESSION: No acute cardiopulmonary findings. Electronically Signed   By: Marijo Sanes M.D.   On: 08/16/2016 14:31   US Renal  Result Date: 08/17/2016 CLINICAL DATA:  Acute renal injury. EXAM: RENAL / URINARY TRACT ULTRASOUND COMPLETE COMPARISON:  None. FINDINGS: Right Kidney: Length: 12.8 cm. Echogenicity within normal limits. No mass or hydronephrosis visualized. Left Kidney: Length: 13.9 cm. Echogenicity within normal limits. No mass or hydronephrosis visualized. Bladder: Prostate is enlarged.  Bladder is nondistended. IMPRESSION: 1. Enlarged prostate. 2. Exam otherwise unremarkable. No focal renal abnormality identified. No hydronephrosis. Electronically Signed   By: Marcello Moores  Register   On: 08/17/2016 16:08    Amanat Hackel, DO  [facility] Pager [phone]  If 7PM-7AM, please contact night-coverage [email or web address] Password [email or web address] 08/18/2016, 10:35 AM   LOS: 2  days

## 2016-08-19 ENCOUNTER — Inpatient Hospital Stay (HOSPITAL_COMMUNITY): Payer: Medicare Other

## 2016-08-19 DIAGNOSIS — B9689 Other specified bacterial agents as the cause of diseases classified elsewhere: Secondary | ICD-10-CM | POA: Diagnosis present

## 2016-08-19 DIAGNOSIS — R7881 Bacteremia: Secondary | ICD-10-CM | POA: Diagnosis present

## 2016-08-19 DIAGNOSIS — A4159 Other Gram-negative sepsis: Secondary | ICD-10-CM | POA: Diagnosis present

## 2016-08-19 DIAGNOSIS — R0600 Dyspnea, unspecified: Secondary | ICD-10-CM

## 2016-08-19 HISTORY — DX: Bacteremia: R78.81

## 2016-08-19 HISTORY — DX: Other gram-negative sepsis: A41.59

## 2016-08-19 HISTORY — DX: Other specified bacterial agents as the cause of diseases classified elsewhere: B96.89

## 2016-08-19 LAB — BASIC METABOLIC PANEL
Anion gap: 8 (ref 5–15)
BUN: 26 mg/dL — ABNORMAL HIGH (ref 6–20)
CO2: 19 mmol/L — ABNORMAL LOW (ref 22–32)
Calcium: 7.5 mg/dL — ABNORMAL LOW (ref 8.9–10.3)
Chloride: 106 mmol/L (ref 101–111)
Creatinine, Ser: 1.55 mg/dL — ABNORMAL HIGH (ref 0.61–1.24)
GFR calc Af Amer: 49 mL/min — ABNORMAL LOW (ref >60)
GFR calc non Af Amer: 42 mL/min — ABNORMAL LOW (ref >60)
Glucose, Bld: 99 mg/dL (ref 65–99)
Potassium: 3.3 mmol/L — ABNORMAL LOW (ref 3.5–5.1)
Sodium: 133 mmol/L — ABNORMAL LOW (ref 135–145)

## 2016-08-19 LAB — URINE CULTURE: Culture: >=100000 — AB

## 2016-08-19 LAB — CBC WITH DIFFERENTIAL/PLATELET
Basophils Absolute: 0 10*3/uL (ref 0.0–0.1)
Basophils Relative: 0 %
Eosinophils Absolute: 0.3 10*3/uL (ref 0.0–0.7)
Eosinophils Relative: 1 %
HCT: 31.4 % — ABNORMAL LOW (ref 39.0–52.0)
Hemoglobin: 10.6 g/dL — ABNORMAL LOW (ref 13.0–17.0)
Lymphocytes Relative: 37 %
Lymphs Abs: 10.2 10*3/uL — ABNORMAL HIGH (ref 0.7–4.0)
MCH: 29.8 pg (ref 26.0–34.0)
MCHC: 33.8 g/dL (ref 30.0–36.0)
MCV: 88.2 fL (ref 78.0–100.0)
Monocytes Absolute: 1.1 10*3/uL — ABNORMAL HIGH (ref 0.1–1.0)
Monocytes Relative: 4 %
Neutro Abs: 16 10*3/uL — ABNORMAL HIGH (ref 1.7–7.7)
Neutrophils Relative %: 58 %
Platelets: 142 10*3/uL — ABNORMAL LOW (ref 150–400)
RBC: 3.56 MIL/uL — ABNORMAL LOW (ref 4.22–5.81)
RDW: 13.3 % (ref 11.5–15.5)
WBC: 27.6 10*3/uL — ABNORMAL HIGH (ref 4.0–10.5)

## 2016-08-19 LAB — CULTURE, BLOOD (ROUTINE X 2)

## 2016-08-19 LAB — MAGNESIUM: Magnesium: 2.1 mg/dL (ref 1.7–2.4)

## 2016-08-19 LAB — ECHOCARDIOGRAM COMPLETE
Height: 64 in
Weight: 2864 oz

## 2016-08-19 LAB — URIC ACID: Uric Acid, Serum: 4.5 mg/dL (ref 4.4–7.6)

## 2016-08-19 MED ORDER — CIPROFLOXACIN HCL 500 MG PO TABS
500.0000 mg | ORAL_TABLET | Freq: Two times a day (BID) | ORAL | Status: DC
  Administered 2016-08-19 – 2016-08-20 (×2): 500 mg via ORAL
  Filled 2016-08-19 (×2): qty 1

## 2016-08-19 MED ORDER — POTASSIUM CHLORIDE CRYS ER 20 MEQ PO TBCR
40.0000 meq | EXTENDED_RELEASE_TABLET | Freq: Once | ORAL | Status: AC
  Administered 2016-08-19: 40 meq via ORAL
  Filled 2016-08-19: qty 2

## 2016-08-19 MED ORDER — ENOXAPARIN SODIUM 40 MG/0.4ML ~~LOC~~ SOLN
40.0000 mg | SUBCUTANEOUS | Status: DC
  Administered 2016-08-19: 40 mg via SUBCUTANEOUS
  Filled 2016-08-19: qty 0.4

## 2016-08-19 NOTE — Progress Notes (Signed)
PROGRESS NOTE  [name] [id number] DOB: Oct 13, 1940 DOA: 08/16/2016 PCP: Henrine Screws, MD Brief History: 76 year old male with a history of CLL, hypertension, hyperlipidemia presenting with confusion. The patient had been suffering with URI type symptoms with coughing and congestion for 1-1/2 weeks. The patient was prescribed oseltamivir by his primary care provider which he finishedon 08/13/2016. Notably, his cough and congestion have improved, but his wife noted patient to be febrile and more confused with increasing generalized weakness. As result, the patient was brought to the hospital for further evaluation. Upon presentation the patient was noted to have WBC 39.2 with lactic acid of 2.3 and serum creatinine 1.94. Because there was lab error, the lactic acid was actually obtained after receiving fluid resuscitation. Urinalysis showed TNTC WBC.The patient was started on intravenous ceftriaxone after blood and urine cultures were obtained. The patient's metabolic coverage was broadened to meropenem when his fevers persisted. However when his cultures returned, his antibiotic coverage was de-escalated to ciprofloxacin.  Assessment/Plan: Sepsis -Secondary to UTI and bacteremia -continue ceftriaxone pending urine and blood cultures -PCT 4.70 -continue IVF-->saline lock -Lactate 2.3>>>1.4  Bacteremia--Enterobacter aerogenes -source is urine -prelim = Enterobacter due to propensity of MDR -broaden to [facility] - Inpatient to cipro -plan ciprofloxacin through 08/29/16 if continues to improve without complication -afebrile last 36 hours  UTI-Enterobacter -d/c merrem -start po cipro  Acute kidney injury -Secondary to sepsis -Baseline creatinine 1.1-1.3 -Renal ultrasound--neg for hydronephrosis -creatinine improving  Acute metabolic encephalopathy -Secondary to sepsis and acute kidney injury -Improved with antibiotics and fluid  hydration  Dyspnea -secondary to metabolic acidosis -personally reviewed CXR--negative for infiltrate or edema -increase bicarbonate 1300 mg bid -check echo--EF 65-70%, grade 2 DD, no WMA  Right Foot/Ankle pain -??gout -doubt metastatic infection--no edema, erythema, synovitis -xray right foot/ankle--neg -check uric acid--4.5  Hypokalemia -Replete -Check magnesium--2.1  CLL -differential does not suggest leukemic conversion -partly contributing to leukemoid reaction -am CBC with diff--no indication of leukemic conversion/blast crisis  Essential HTN -restarted bisoprolol without HCTZ  Hyperlipidemia -continue statin  BPH -continue flomax  Deconditioning -PT evaluation    Disposition Plan: Home likely 08/20/16 pending PT eval Family Communication: Spouse updated--Total time spent 45 minutes.  Greater than 50% spent face to face counseling and coordinating care.  Daughter and son-in-law are internists   Consultants: none  Code Status: FULL  DVT Prophylaxis:[place] Lovenox   Procedures: As Listed in Progress Note Above  Antibiotics: Ceftriaxone 08/16/16>>>08/18/16 Merrem 08/18/16>3/2 cipro 3/2>>>    Subjective: Patient complains of right ankle, right foot pain. Denies any falls or injuries. Painful to bear weight. Denies any fevers, chills, chest pain, shortness breath, nausea, vomiting, diarrhea, abdominal pain. No dysuria or hematuria. Denies any headache or neck pain.  Objective: Vitals:   08/18/16 2041 08/19/16 0509 08/19/16 1000 08/19/16 1500  BP: 128/75 (!) 145/78 (!) 142/72 (!) 148/79  Pulse: 81 74 74 69  Resp: 20 20  20   Temp: 99.8 F (37.7 C) 99.8 F (37.7 C)  98.4 F (36.9 C)  TempSrc: Oral Oral  Tympanic  SpO2: 97% 99%  100%  Weight:      Height:        Intake/Output Summary (Last 24 hours) at 08/19/16 1816 Last data filed at 08/19/16 1746  Gross per 24 hour  Intake              920 ml  Output             1925 ml  Net             -1005 ml   Weight change:  Exam:   General:  Pt is alert, follows commands appropriately, not in acute distress  HEENT: No icterus, No thrush, No neck mass, [place]/AT  Cardiovascular: RRR, S1/S2, no rubs, no gallops  Respiratory: CTA bilaterally, no wheezing, no crackles, no rhonchi  Abdomen: Soft/+BS, non tender, non distended, no guarding  Extremities: No edema, No lymphangitis, No petechiae, No rashes, no synovitis; right ankle and right foot without any erythema, synovitis, lymphangitis, crepitance   Data Reviewed: I have personally reviewed following labs and imaging studies Basic Metabolic Panel:  Recent Labs Lab 08/16/16 1344 08/16/16 2135 08/17/16 0311 08/17/16 1806 08/18/16 0348 08/19/16 0636  NA 131*  --  135 132* 135 133*  K 2.9*  --  3.2* 3.5 3.2* 3.3*  CL 100*  --  108 105 108 106  CO2 21*  --  19* 18* 20* 19*  GLUCOSE 183*  --  149* 166* 110* 99  BUN 23*  --  23* 23* 24* 26*  CREATININE 1.94* 1.56* 2.07* 2.04* 1.93* 1.55*  CALCIUM 7.9*  --  7.2* 7.1* 7.1* 7.5*  MG  --   --   --   --  1.7 2.1   Liver Function Tests:  Recent Labs Lab 08/16/16 1344  AST 36  ALT 41  ALKPHOS 115  BILITOT 1.4*  PROT 6.3*  ALBUMIN 3.0*   No results for input(s): LIPASE, AMYLASE in the last 168 hours. No results for input(s): AMMONIA in the last 168 hours. Coagulation Profile:  Recent Labs Lab 08/16/16 1344  INR 1.13   CBC:  Recent Labs Lab 08/16/16 1344 08/16/16 2135 08/17/16 0311 08/18/16 0348 08/19/16 0636  WBC 33.7* 39.2* 37.1* 31.5* 27.6*  NEUTROABS 23.0*  --   --   --  16.0*  HGB 12.3* 10.6* 11.1* 10.6* 10.6*  HCT 36.3* 31.1* 32.9* 31.2* 31.4*  MCV 88.5 89.1 88.7 88.9 88.2  PLT 177 166 164 160 142*   Cardiac Enzymes: No results for input(s): CKTOTAL, CKMB, CKMBINDEX, TROPONINI in the last 168 hours. BNP: Invalid input(s): POCBNP CBG: No results for input(s): GLUCAP in the last 168 hours. HbA1C: No results for input(s): HGBA1C in  the last 72 hours. Urine analysis:    Component Value Date/Time   COLORURINE AMBER (A) 08/16/2016 1353   APPEARANCEUR CLOUDY (A) 08/16/2016 1353   LABSPEC 1.015 08/16/2016 1353   PHURINE 5.0 08/16/2016 1353   GLUCOSEU NEGATIVE 08/16/2016 1353   HGBUR MODERATE (A) 08/16/2016 1353   BILIRUBINUR NEGATIVE 08/16/2016 1353   KETONESUR NEGATIVE 08/16/2016 1353   PROTEINUR 100 (A) 08/16/2016 1353   NITRITE NEGATIVE 08/16/2016 1353   LEUKOCYTESUR LARGE (A) 08/16/2016 1353   Sepsis Labs: @LABRCNTIP (procalcitonin:4,lacticidven:4) ) Recent Results (from the past 240 hour(s))  Culture, blood (Routine x 2)     Status: Abnormal   Collection Time: 08/16/16  1:42 PM  Result Value Ref Range Status   Specimen Description BLOOD RIGHT HAND  Final   Special Requests BOTTLES DRAWN AEROBIC AND ANAEROBIC 5 CC EA  Final   Culture  Setup Time   Final    GRAM NEGATIVE RODS ANAEROBIC BOTTLE ONLY CRITICAL RESULT CALLED TO, READ BACK BY AND VERIFIED WITH: T. Green Pharm.D. 12:40 08/17/16 ([email or web address]) Performed at [facility], [place] [address], [place], [place] [address]    Culture ENTEROBACTER AEROGENES (A)  Final   Report Status 08/19/2016 FINAL  Final  Organism ID, Bacteria ENTEROBACTER AEROGENES  Final      Susceptibility   Enterobacter aerogenes - MIC*    CEFAZOLIN >=64 RESISTANT Resistant     CEFEPIME <=1 SENSITIVE Sensitive     CEFTAZIDIME <=1 SENSITIVE Sensitive     CEFTRIAXONE <=1 SENSITIVE Sensitive     CIPROFLOXACIN <=0.25 SENSITIVE Sensitive     GENTAMICIN <=1 SENSITIVE Sensitive     IMIPENEM 2 SENSITIVE Sensitive     TRIMETH/SULFA <=20 SENSITIVE Sensitive     PIP/TAZO 8 SENSITIVE Sensitive     * ENTEROBACTER AEROGENES  Blood Culture ID Panel (Reflexed)     Status: Abnormal   Collection Time: 08/16/16  1:42 PM  Result Value Ref Range Status   Enterococcus species NOT DETECTED NOT DETECTED Final   Listeria monocytogenes NOT DETECTED NOT DETECTED Final   Staphylococcus species NOT  DETECTED NOT DETECTED Final   Staphylococcus aureus NOT DETECTED NOT DETECTED Final   Streptococcus species NOT DETECTED NOT DETECTED Final   Streptococcus agalactiae NOT DETECTED NOT DETECTED Final   Streptococcus pneumoniae NOT DETECTED NOT DETECTED Final   Streptococcus pyogenes NOT DETECTED NOT DETECTED Final   Acinetobacter baumannii NOT DETECTED NOT DETECTED Final   Enterobacteriaceae species DETECTED (A) NOT DETECTED Final    Comment: Enterobacteriaceae represent a large family of gram negative bacteria, not a single organism. Refer to culture for further identification. CRITICAL RESULT CALLED TO, READ BACK BY AND VERIFIED WITH: T. Green Pharm.D. 12:40 08/17/16 ([email or web address])    Enterobacter cloacae complex NOT DETECTED NOT DETECTED Final   Escherichia coli NOT DETECTED NOT DETECTED Final   Klebsiella oxytoca NOT DETECTED NOT DETECTED Final   Klebsiella pneumoniae NOT DETECTED NOT DETECTED Final   Proteus species NOT DETECTED NOT DETECTED Final   Serratia marcescens NOT DETECTED NOT DETECTED Final   Carbapenem resistance NOT DETECTED NOT DETECTED Final   Haemophilus influenzae NOT DETECTED NOT DETECTED Final   Neisseria meningitidis NOT DETECTED NOT DETECTED Final   Pseudomonas aeruginosa NOT DETECTED NOT DETECTED Final   Candida albicans NOT DETECTED NOT DETECTED Final   Candida glabrata NOT DETECTED NOT DETECTED Final   Candida krusei NOT DETECTED NOT DETECTED Final   Candida parapsilosis NOT DETECTED NOT DETECTED Final   Candida tropicalis NOT DETECTED NOT DETECTED Final    Comment: Performed at [facility], [place] [address] [place], [place] [address]  Urine culture     Status: Abnormal   Collection Time: 08/16/16  1:53 PM  Result Value Ref Range Status   Specimen Description URINE, RANDOM  Final   Special Requests NONE  Final   Culture >=100,000 COLONIES/mL ENTEROBACTER AEROGENES (A)  Final   Report Status 08/19/2016 FINAL  Final   Organism ID, Bacteria ENTEROBACTER  AEROGENES (A)  Final      Susceptibility   Enterobacter aerogenes - MIC*    CEFAZOLIN >=64 RESISTANT Resistant     CEFTRIAXONE <=1 SENSITIVE Sensitive     CIPROFLOXACIN <=0.25 SENSITIVE Sensitive     GENTAMICIN <=1 SENSITIVE Sensitive     IMIPENEM 2 SENSITIVE Sensitive     NITROFURANTOIN 64 INTERMEDIATE Intermediate     TRIMETH/SULFA <=20 SENSITIVE Sensitive     PIP/TAZO <=4 SENSITIVE Sensitive     * >=100,000 COLONIES/mL ENTEROBACTER AEROGENES  Culture, blood (Routine x 2)     Status: None (Preliminary result)   Collection Time: 08/16/16  2:17 PM  Result Value Ref Range Status   Specimen Description BLOOD LEFT HAND  Final   Special Requests BOTTLES  DRAWN AEROBIC AND ANAEROBIC 5 CC EA  Final   Culture   Final    NO GROWTH 3 DAYS Performed at [facility], [place] [address] [place], [place] [address]    Report Status PENDING  Incomplete  MRSA PCR Screening     Status: None   Collection Time: 08/16/16  8:42 PM  Result Value Ref Range Status   MRSA by PCR NEGATIVE NEGATIVE Final    Comment:        The GeneXpert MRSA Assay (FDA approved for NASAL specimens only), is one component of a comprehensive MRSA colonization surveillance program. It is not intended to diagnose MRSA infection nor to guide or monitor treatment for MRSA infections.   C difficile quick scan w PCR reflex     Status: None   Collection Time: 08/18/16  6:30 PM  Result Value Ref Range Status   C Diff antigen NEGATIVE NEGATIVE Final   C Diff toxin NEGATIVE NEGATIVE Final   C Diff interpretation No C. difficile detected.  Final     Scheduled Meds: . bisoprolol  10 mg Oral Daily  . enoxaparin (LOVENOX) injection  40 mg Subcutaneous Q24H  . meropenem (MERREM) IV  1 g Intravenous Q12H  . simvastatin  20 mg Oral QPM  . sodium bicarbonate  1,300 mg Oral BID  . sodium chloride flush  3 mL Intravenous Q12H  . tamsulosin  0.4 mg Oral QPC supper   Continuous Infusions:  Procedures/Studies: Dg Chest 2  View  Result Date: 08/17/2016 CLINICAL DATA:  Cough and chest congestion for the past 2 weeks associated with dyspnea. History of chronic lymphocytic leukemia nonsmoker. EXAM: CHEST  2 VIEW COMPARISON:  Chest x-ray of August 16, 2016 and August 23, 2012. FINDINGS: The lungs are reasonably well inflated and clear there is a stable nodule just lateral to the left cardiac apex unchanged since August 23, 2012. The heart and pulmonary vascularity are normal. The mediastinum is normal in width. There is no pleural effusion. There is calcification in the wall of the aortic arch. The bony thorax exhibits no acute abnormality. IMPRESSION: There is no pneumonia, CHF, nor other acute cardiopulmonary abnormality. Thoracic aortic atherosclerosis. Electronically Signed   By: Monteen Toops  [place] M.D.   On: 08/17/2016 13:04   Dg Chest 2 View  Result Date: 08/16/2016 CLINICAL DATA:  Fever and cough. EXAM: CHEST  2 VIEW COMPARISON:  08/23/2012 FINDINGS: The heart is upper limits of normal in size and stable. There is mild tortuosity of the thoracic aorta. Low lung volumes but no infiltrates, edema or effusions. The bony thorax is intact. IMPRESSION: No acute cardiopulmonary findings. Electronically Signed   By: Marijo Sanes M.D.   On: 08/16/2016 14:31   Dg Ankle 2 Views Right  Result Date: 08/19/2016 CLINICAL DATA:  Right ankle pain.  No known injury. EXAM: RIGHT ANKLE - 2 VIEW COMPARISON:  None FINDINGS: The ankle mortise is maintained. No acute ankle fracture or osteochondral lesion. No definite ankle joint effusion. The mid and hindfoot bony structures are intact. There is a moderate to large calcaneal heel spur. Small amount of calcification is also noted in the distal Achilles tendon. IMPRESSION: No acute bony findings. Electronically Signed   By: Marijo Sanes M.D.   On: 08/19/2016 17:12   US Renal  Result Date: 08/17/2016 CLINICAL DATA:  Acute renal injury. EXAM: RENAL / URINARY TRACT ULTRASOUND COMPLETE COMPARISON:   None. FINDINGS: Right Kidney: Length: 12.8 cm. Echogenicity within normal limits. No mass  or hydronephrosis visualized. Left Kidney: Length: 13.9 cm. Echogenicity within normal limits. No mass or hydronephrosis visualized. Bladder: Prostate is enlarged.  Bladder is nondistended. IMPRESSION: 1. Enlarged prostate. 2. Exam otherwise unremarkable. No focal renal abnormality identified. No hydronephrosis. Electronically Signed   By: Marcello Moores  Register   On: 08/17/2016 16:08   Dg Foot Complete Right  Result Date: 08/19/2016 CLINICAL DATA:  Medial foot and ankle pain today with no injury, pain at proximal fifth metatarsal EXAM: RIGHT FOOT COMPLETE - 3+ VIEW COMPARISON:  None FINDINGS: Osseous mineralization normal. Advanced degenerative changes of the first MTP joint with joint space narrowing and bulky spur formation especially laterally and dorsally. Non fused ossicle at lateral margin of the second toe PIP joint question sequela of remote trauma. No acute fracture, dislocation, or bone destruction. Moderate-sized plantar calcaneal spur. IMPRESSION: No acute osseous abnormalities. Calcaneal spurring. Advanced degenerative changes RIGHT first MTP joint. Electronically Signed   By: Lavonia Dana M.D.   On: 08/19/2016 17:13    Taim Wurm, DO  [facility] Pager [phone]  If 7PM-7AM, please contact night-coverage [email or web address] Password [email or web address] 08/19/2016, 6:16 PM   LOS: 3 days

## 2016-08-19 NOTE — Progress Notes (Signed)
  Echocardiogram 2D Echocardiogram has been performed.  Hajira Verhagen L Androw 08/19/2016, 10:00 AM

## 2016-08-20 DIAGNOSIS — B9689 Other specified bacterial agents as the cause of diseases classified elsewhere: Secondary | ICD-10-CM

## 2016-08-20 DIAGNOSIS — E876 Hypokalemia: Secondary | ICD-10-CM

## 2016-08-20 DIAGNOSIS — A4159 Other Gram-negative sepsis: Principal | ICD-10-CM

## 2016-08-20 DIAGNOSIS — G9340 Encephalopathy, unspecified: Secondary | ICD-10-CM

## 2016-08-20 DIAGNOSIS — I10 Essential (primary) hypertension: Secondary | ICD-10-CM

## 2016-08-20 DIAGNOSIS — N390 Urinary tract infection, site not specified: Secondary | ICD-10-CM

## 2016-08-20 DIAGNOSIS — C9110 Chronic lymphocytic leukemia of B-cell type not having achieved remission: Secondary | ICD-10-CM

## 2016-08-20 DIAGNOSIS — R319 Hematuria, unspecified: Secondary | ICD-10-CM

## 2016-08-20 DIAGNOSIS — N179 Acute kidney failure, unspecified: Secondary | ICD-10-CM

## 2016-08-20 DIAGNOSIS — R7881 Bacteremia: Secondary | ICD-10-CM

## 2016-08-20 DIAGNOSIS — M79671 Pain in right foot: Secondary | ICD-10-CM

## 2016-08-20 LAB — BASIC METABOLIC PANEL
Anion gap: 7 (ref 5–15)
BUN: 26 mg/dL — ABNORMAL HIGH (ref 6–20)
CO2: 20 mmol/L — ABNORMAL LOW (ref 22–32)
Calcium: 7.4 mg/dL — ABNORMAL LOW (ref 8.9–10.3)
Chloride: 108 mmol/L (ref 101–111)
Creatinine, Ser: 1.35 mg/dL — ABNORMAL HIGH (ref 0.61–1.24)
GFR calc Af Amer: 58 mL/min — ABNORMAL LOW (ref >60)
GFR calc non Af Amer: 50 mL/min — ABNORMAL LOW (ref >60)
Glucose, Bld: 99 mg/dL (ref 65–99)
Potassium: 3.8 mmol/L (ref 3.5–5.1)
Sodium: 135 mmol/L (ref 135–145)

## 2016-08-20 LAB — CBC WITH DIFFERENTIAL/PLATELET
Basophils Absolute: 0 10*3/uL (ref 0.0–0.1)
Basophils Relative: 0 %
Eosinophils Absolute: 0.2 10*3/uL (ref 0.0–0.7)
Eosinophils Relative: 1 %
HCT: 33.4 % — ABNORMAL LOW (ref 39.0–52.0)
Hemoglobin: 11.3 g/dL — ABNORMAL LOW (ref 13.0–17.0)
Lymphocytes Relative: 45 %
Lymphs Abs: 9.4 10*3/uL — ABNORMAL HIGH (ref 0.7–4.0)
MCH: 29.6 pg (ref 26.0–34.0)
MCHC: 33.8 g/dL (ref 30.0–36.0)
MCV: 87.4 fL (ref 78.0–100.0)
Monocytes Absolute: 1 10*3/uL (ref 0.1–1.0)
Monocytes Relative: 5 %
Neutro Abs: 10.3 10*3/uL — ABNORMAL HIGH (ref 1.7–7.7)
Neutrophils Relative %: 49 %
Platelets: 125 10*3/uL — ABNORMAL LOW (ref 150–400)
RBC: 3.82 MIL/uL — ABNORMAL LOW (ref 4.22–5.81)
RDW: 13.4 % (ref 11.5–15.5)
WBC: 20.9 10*3/uL — ABNORMAL HIGH (ref 4.0–10.5)

## 2016-08-20 MED ORDER — POLYETHYLENE GLYCOL 3350 17 G PO PACK: 17.0000 g | PACK | Freq: Every day | ORAL | 0 refills | Status: DC | PRN

## 2016-08-20 MED ORDER — CIPROFLOXACIN HCL 500 MG PO TABS: 500.0000 mg | ORAL_TABLET | Freq: Two times a day (BID) | ORAL | 0 refills | Status: AC

## 2016-08-20 NOTE — Evaluation (Signed)
Physical Therapy Evaluation Patient Details Name: [name] MRN: [id number] DOB: [birth year] Today's Date: 08/20/2016   History of Present Illness  Pt admitted with confusion, UTI, sepsis, bacteremia and bilat ankle pain (R worse than L) with no fx per Xray  Clinical Impression  Pt admitted as above and presenting with functional mobility limitations 2* generalized weakness, mild ambulatory balance deficits and bilat ankle pain.  Pt should progress to dc home with assist of family and friends.    Follow Up Recommendations No PT follow up    Equipment Recommendations  None recommended by PT    Recommendations for Other Services       Precautions / Restrictions Precautions Precautions: Fall Restrictions Weight Bearing Restrictions: No      Mobility  Bed Mobility Overal bed mobility: Modified Independent                Transfers Overall transfer level: Needs assistance   Transfers: Sit to/from Stand Sit to Stand: Min guard         General transfer comment: cues for use of UEs to self assist  Ambulation/Gait Ambulation/Gait assistance: Min guard;Supervision Ambulation Distance (Feet): 180 Feet Assistive device: Rolling walker (2 wheeled) Gait Pattern/deviations: Step-to pattern;Step-through pattern;Decreased step length - right;Decreased step length - left;Shuffle;Trunk flexed Gait velocity: decr Gait velocity interpretation: Below normal speed for age/gender General Gait Details: cues for posture, position from RW and initial sequence  Stairs            Wheelchair Mobility    Modified Rankin (Stroke Patients Only)       Balance Overall balance assessment: Needs assistance Sitting-balance support: No upper extremity supported;Feet supported Sitting balance-Leahy Scale: Good     Standing balance support: No upper extremity supported Standing balance-Leahy Scale: Fair Standing balance comment: Ltd by [employer] tolerance R ankle                              Pertinent Vitals/Pain Pain Assessment: 0-10 Pain Score: 6  (R ankle 6/10 with WB, L ankle 1/10) Pain Location: ankles Pain Descriptors / Indicators: Aching;Sore Pain Intervention(s): Limited activity within patient's tolerance;Monitored during session    Henry expects to be discharged to:: Private residence Living Arrangements: Spouse/significant other Available Help at Discharge: Family;Friend(s);Available 24 hours/day Type of Home: House Home Access: Stairs to enter   [employer] of Steps: 1 Home Layout: Two level;Able to live on main level with bedroom/bathroom Home Equipment: Gilford Rile - 2 wheels (Pt has arranged to borrow RW)      Prior Function Level of Independence: Independent               Hand Dominance        Extremity/Trunk Assessment   Upper Extremity Assessment Upper Extremity Assessment: Generalized weakness    Lower Extremity Assessment Lower Extremity Assessment: Generalized weakness       Communication   Communication: No difficulties  Cognition Arousal/Alertness: Awake/alert Behavior During Therapy: WFL for tasks assessed/performed Overall Cognitive Status: Within Functional Limits for tasks assessed                      General Comments      Exercises     Assessment/Plan    PT Assessment Patient needs continued PT services  PT Problem List Decreased strength;Decreased activity tolerance;Decreased balance;Decreased mobility;Decreased knowledge of use of DME;Pain       PT Treatment Interventions DME instruction;Gait training;Stair  training;Functional mobility training;Therapeutic activities;Therapeutic exercise;Patient/family education    PT Goals (Current goals can be found in the Care Plan section)  Acute Rehab PT Goals Patient Stated Goal: Regain IND PT Goal Formulation: With patient Time For Goal Achievement: 08/27/16 Potential to Achieve Goals: Good     Frequency Min 3X/week   Barriers to discharge        Co-evaluation               End of Session Equipment Utilized During Treatment: Gait belt Activity Tolerance: Patient tolerated treatment well;Patient limited by fatigue Patient left: in chair;with call bell/phone within reach Nurse Communication: Mobility status PT Visit Diagnosis: Unsteadiness on feet (R26.81);Pain Pain - Right/Left: Right Pain - part of body: Ankle and joints of foot         Time: [id number] PT Time Calculation (min) (ACUTE ONLY): 28 min   Charges:   PT Evaluation $PT Eval Low Complexity: 1 Procedure PT Treatments $Gait Training: 8-22 mins   PT G Codes:         Meko Masterson 08/20/2016, 1:27 PM

## 2016-08-20 NOTE — Progress Notes (Signed)
Physical Therapy Treatment Patient Details Name: [name] MRN: [id number] DOB: 1940-10-03 Today's Date: 08/20/2016    History of Present Illness Pt admitted with confusion, UTI, sepsis, bacteremia and bilat ankle pain (R worse than L) with no fx per Xray    PT Comments    Pt and spouse concerned regarding stairs to second level.  Stairs reviewed with pt and written instructions provided.   Follow Up Recommendations  No PT follow up     Equipment Recommendations  None recommended by PT    Recommendations for Other Services       Precautions / Restrictions Precautions Precautions: Fall Restrictions Weight Bearing Restrictions: No    Mobility  Bed Mobility Overal bed mobility: Modified Independent                Transfers Overall transfer level: Needs assistance Equipment used: None Transfers: Sit to/from Stand Sit to Stand: Supervision         General transfer comment: cues for use of UEs to self assist  Ambulation/Gait Ambulation/Gait assistance: Min guard;Supervision Ambulation Distance (Feet): 38 Feet Assistive device: Rolling walker (2 wheeled) Gait Pattern/deviations: Step-to pattern;Step-through pattern;Decreased step length - right;Decreased step length - left;Shuffle;Trunk flexed Gait velocity: decr Gait velocity interpretation: Below normal speed for age/gender General Gait Details: min cues for posture, position from RW and initial sequence   Stairs Stairs: Yes   Stair Management: One rail Right;Step to pattern;Forwards;With crutches Number of Stairs: 5 General stair comments: single step with rail only but pt struggling to WB on R LE.  4 stairs with crutch and rail - pt states feels much more stable.  Cues for sequence and foot/crutch placement.    Wheelchair Mobility    Modified Rankin (Stroke Patients Only)       Balance Overall balance assessment: Needs assistance Sitting-balance support: No upper extremity  supported;Feet supported Sitting balance-Leahy Scale: Good     Standing balance support: No upper extremity supported Standing balance-Leahy Scale: Fair Standing balance comment: Ltd by [employer] tolerance R ankle                    Cognition Arousal/Alertness: Awake/alert Behavior During Therapy: WFL for tasks assessed/performed Overall Cognitive Status: Within Functional Limits for tasks assessed                      Exercises      General Comments        Pertinent Vitals/Pain Pain Assessment: 0-10 Pain Score: 6  (6/10 R ankle, 1/10 L ankle) Pain Location: ankles Pain Descriptors / Indicators: Aching;Sore Pain Intervention(s): Monitored during session;Limited activity within patient's tolerance    Home Living Family/patient expects to be discharged to:: Private residence Living Arrangements: Spouse/significant other Available Help at Discharge: Family;Friend(s);Available 24 hours/day Type of Home: House Home Access: Stairs to enter   Home Layout: Two level;Able to live on main level with bedroom/bathroom Home Equipment: Walker - 2 wheels (Pt has arranged to borrow RW)      Prior Function Level of Independence: Independent          PT Goals (current goals can now be found in the care plan section) Acute Rehab PT Goals Patient Stated Goal: Regain IND PT Goal Formulation: With patient Time For Goal Achievement: 08/27/16 Potential to Achieve Goals: Good    Frequency    Min 3X/week      PT Plan Current plan remains appropriate    Co-evaluation  End of Session Equipment Utilized During Treatment: Gait belt Activity Tolerance: Patient tolerated treatment well;Patient limited by fatigue Patient left: in chair;with call bell/phone within reach Nurse Communication: Mobility status PT Visit Diagnosis: Unsteadiness on feet (R26.81);Pain Pain - Right/Left: Right Pain - part of body: Ankle and joints of foot     Time: 1205-1222 PT  Time Calculation (min) (ACUTE ONLY): 17 min  Charges:  $Gait Training: 8-22 mins                    G Codes:       [name] 08-26-2016, 3:29 PM

## 2016-08-20 NOTE — Progress Notes (Signed)
Patient discharged home.  Leaving with personal belongings, prescriptions, and one crutch per [place], PT.  Patient and wife report understanding of discharge instructions.  Room air, no s/s of distress.  Accompanied by wife at discharge.  No complaints.

## 2016-08-20 NOTE — Discharge Summary (Signed)
Physician Discharge Summary  [name] [id number] DOB: 18-Jan-1941 DOA: 08/16/2016  PCP: Henrine Screws, MD  Admit date: 08/16/2016 Discharge date: 08/20/2016  Admitted From: home Disposition:  home  Recommendations for Outpatient Follow-up:  1. Follow up with PCP in 1-2 weeks 2. Please obtain BMP during follow up.  patient will complete 2 weeks of antibiotics  ( Cipro) on 08/30/2016.   Home Health:none Equipment/Devices:none  Discharge Condition:stable CODE STATUS:full code Diet recommendation: Low sodium  Discharge Diagnoses:   Principle problem   Enterobacter sepsis ([place])   Active Problems:    Urinary tract infection with hematuria   Acute encephalopathy   Bacteremia due to Enterobacter species   AKI (acute kidney injury) ([place])   CLL (chronic lymphocytic leukemia) ([facility])   Hypertension   Hypokalemia   Hyperglycemia  Brief narrative/ HPI Please refer to admission H&P for details, in brief, 76 year old male with a history of CLL, hypertension, hyperlipidemia presenting with confusion. The patient had been suffering with URI type symptoms with coughing and congestion for 1-1/2 weeks. The patient was prescribed oseltamivir by his primary care provider which he finishedon 08/13/2016. Notably, his cough and congestion have improved, but his wife noted patient to be febrile and more confused with increasing generalized weakness. As result, the patient was brought to the hospital for further evaluation. Upon presentation the patient was noted to have WBC 39.2 with lactic acid of 2.3 and serum creatinine 1.94. Because there was lab error, the lactic acid was actually obtained after receiving fluid resuscitation. Urinalysis showed TNTC WBC.The patient was started on intravenous ceftriaxone after blood and urine cultures were obtained. The patient's metabolic coverage was broadened to meropenem when his fevers persisted. However when his cultures returned, his antibiotic  coverage was de-escalated to ciprofloxacin.   Hospital course Sepsis [facility]) -Secondary to UTI and bacteremia Resolved with fluids and antibiottics.  UTI with Bacteremia--Enterobacter aerogenes Empiric abx narrowed to quinolone based on sensitivity. Plan to treat for 2 weeks duration ( until 3/13)    Acute kidney injury -Secondary to sepsis -Renal ultrasound--neg for hydronephrosis -creatinine improving to baseline. Patient takes NSAIDs at home for pain and instructed to avoid completely. Held HCTZ while in the hospital. instructed to  resume from 3/5. -follow renal function during outpt visit.  Acute metabolic encephalopathy -Secondary to sepsis and acute kidney injury. -Improvedwith antibiotics and  Hydration.  Dyspnea -secondary to metabolic acidosis  echo--EF 65-70%, grade 2 DD, no WMA. symptoms resolved.  Right Foot/Ankle pain -no swelling, edema or erythema, ? Synovitis/ fascitis.  -xray right foot/ankle--neg -uric acid--4.5 -tylenol prn.  Hypokalemia -Repleted   CLL Wbc elevated due to  leukemoid reaction -chronically elevated wbc about 20k ( now at baseline)  Essential HTN -restarted bisoprolol while in hospital. Resume HCTZ from 3/5.  Hyperlipidemia -continue statin  BPH -continue flomax  Deconditioning -seen by PT . No home needs  patient stable to be dsicahrged home with outpt follow up    Family Communication: wife updated on the phone-   Consultants: none  Code Status: FULL  DVT Prophylaxis:[place] Lovenox   Procedures:   Antibiotics: Ceftriaxone 08/16/16>>>08/18/16 Merrem 08/18/16>3/2 cipro 3/2>>>until 3/13   Discharge Instructions   Allergies as of 08/20/2016      Reactions   Dust Mite Extract Other (See Comments)   Itchy, watery eyes       Medication List    STOP taking these medications   ibuprofen 200 MG tablet Commonly known as:  ADVIL,MOTRIN     TAKE these medications  aspirin EC 81 MG  tablet Take 81 mg by mouth daily.   bisoprolol-hydrochlorothiazide 10-6.25 MG tablet Commonly known as:  ZIAC Take 1 tablet by mouth daily.   ciprofloxacin 500 MG tablet Commonly known as:  CIPRO Take 1 tablet (500 mg total) by mouth 2 (two) times daily.   EPINEPHrine 0.3 mg/0.3 mL Devi Commonly known as:  EPIPEN Inject 0.3 mLs (0.3 mg total) into the muscle once. What changed:  when to take this  reasons to take this   loratadine 10 MG tablet Commonly known as:  CLARITIN Take 10 mg by mouth daily.   polyethylene glycol packet Commonly known as:  MIRALAX / GLYCOLAX Take 17 g by mouth daily as needed for mild constipation.   simvastatin 40 MG tablet Commonly known as:  ZOCOR Take 20 mg by mouth every evening.   tamsulosin 0.4 MG Caps capsule Commonly known as:  FLOMAX Take 1 capsule (0.4 mg total) by mouth daily. What changed:  when to take this      Follow-up Information    GATES,ROBERT NEVILL, MD. Schedule an appointment as soon as possible for a visit in 1 week(s).   Specialty:  Internal Medicine Contact information: [address] [place] [address] [phone]          Allergies  Allergen Reactions  . Dust Mite Extract Other (See Comments)    Itchy, watery eyes       Procedures/Studies: Dg Chest 2 View  Result Date: 08/17/2016 CLINICAL DATA:  Cough and chest congestion for the past 2 weeks associated with dyspnea. History of chronic lymphocytic leukemia nonsmoker. EXAM: CHEST  2 VIEW COMPARISON:  Chest x-ray of August 16, 2016 and August 23, 2012. FINDINGS: The lungs are reasonably well inflated and clear there is a stable nodule just lateral to the left cardiac apex unchanged since August 23, 2012. The heart and pulmonary vascularity are normal. The mediastinum is normal in width. There is no pleural effusion. There is calcification in the wall of the aortic arch. The bony thorax exhibits no acute abnormality. IMPRESSION: There is no  pneumonia, CHF, nor other acute cardiopulmonary abnormality. Thoracic aortic atherosclerosis. Electronically Signed   By: David  Martinique M.D.   On: 08/17/2016 13:04   Dg Chest 2 View  Result Date: 08/16/2016 CLINICAL DATA:  Fever and cough. EXAM: CHEST  2 VIEW COMPARISON:  08/23/2012 FINDINGS: The heart is upper limits of normal in size and stable. There is mild tortuosity of the thoracic aorta. Low lung volumes but no infiltrates, edema or effusions. The bony thorax is intact. IMPRESSION: No acute cardiopulmonary findings. Electronically Signed   By: Marijo Sanes M.D.   On: 08/16/2016 14:31   Dg Ankle 2 Views Right  Result Date: 08/19/2016 CLINICAL DATA:  Right ankle pain.  No known injury. EXAM: RIGHT ANKLE - 2 VIEW COMPARISON:  None FINDINGS: The ankle mortise is maintained. No acute ankle fracture or osteochondral lesion. No definite ankle joint effusion. The mid and hindfoot bony structures are intact. There is a moderate to large calcaneal heel spur. Small amount of calcification is also noted in the distal Achilles tendon. IMPRESSION: No acute bony findings. Electronically Signed   By: Marijo Sanes M.D.   On: 08/19/2016 17:12   US Renal  Result Date: 08/17/2016 CLINICAL DATA:  Acute renal injury. EXAM: RENAL / URINARY TRACT ULTRASOUND COMPLETE COMPARISON:  None. FINDINGS: Right Kidney: Length: 12.8 cm. Echogenicity within normal limits. No mass or hydronephrosis visualized. Left Kidney:  Length: 13.9 cm. Echogenicity within normal limits. No mass or hydronephrosis visualized. Bladder: Prostate is enlarged.  Bladder is nondistended. IMPRESSION: 1. Enlarged prostate. 2. Exam otherwise unremarkable. No focal renal abnormality identified. No hydronephrosis. Electronically Signed   By: Marcello Moores  Register   On: 08/17/2016 16:08   Dg Foot Complete Right  Result Date: 08/19/2016 CLINICAL DATA:  Medial foot and ankle pain today with no injury, pain at proximal fifth metatarsal EXAM: RIGHT FOOT COMPLETE -  3+ VIEW COMPARISON:  None FINDINGS: Osseous mineralization normal. Advanced degenerative changes of the first MTP joint with joint space narrowing and bulky spur formation especially laterally and dorsally. Non fused ossicle at lateral margin of the second toe PIP joint question sequela of remote trauma. No acute fracture, dislocation, or bone destruction. Moderate-sized plantar calcaneal spur. IMPRESSION: No acute osseous abnormalities. Calcaneal spurring. Advanced degenerative changes RIGHT first MTP joint. Electronically Signed   By: Lavonia Dana M.D.   On: 08/19/2016 17:13    2d echo Study Conclusions  - Left ventricle: The cavity size was normal. Wall thickness was   increased in a pattern of mild LVH. Systolic function was   vigorous. The estimated ejection fraction was in the range of 65%   to 70%. Wall motion was normal; there were no regional wall   motion abnormalities. Features are consistent with a pseudonormal   left ventricular filling pattern, with concomitant abnormal   relaxation and increased filling pressure (grade 2 diastolic   dysfunction). - Aortic valve: There was trivial regurgitation. - Pulmonary arteries: Systolic pressure was mildly increased. PA   peak pressure: 44 mm Hg (S).  Impressions:  - Vigorous LV systolic function; mild LVH; grade 2 diastolic   dysfunction; calcified aortic valve with trace AI; mildly   elevated LVOT velocity likely related to vigorous LV function;   trace TR; mildly elevated pulmonary pressure.    Subjective: Reports feeling tired. Still has some pain over rt medial dorsum of the foot., better from yesterday  Discharge Exam: Vitals:   08/20/16 0501 08/20/16 1255  BP: (!) 157/86 (!) 142/70  Pulse: 69 69  Resp: 18 18  Temp: 98.1 F (36.7 C) 97.6 F (36.4 C)   Vitals:   08/19/16 1500 08/19/16 2034 08/20/16 0501 08/20/16 1255  BP: (!) 148/79 138/75 (!) 157/86 (!) 142/70  Pulse: 69 72 69 69  Resp: 20 19 18 18   Temp: 98.4  F (36.9 C) 99.3 F (37.4 C) 98.1 F (36.7 C) 97.6 F (36.4 C)  TempSrc: Tympanic Oral Oral Oral  SpO2: 100% 97% 99% 100%  Weight:      Height:        General: , not in acute distress HEENT: moist mucosa, supple neck Chest: clear b/l, no added sounds Cardiovascular: RRR, S1/S2 +, no murmurs GI: Soft, NT, ND, bowel sounds + Extremities: no edema, No swelling or erythema of foot, minimal tenderness over rt medial dorsum of the foot CNS: alert and oriented    The results of significant diagnostics from this hospitalization (including imaging, microbiology, ancillary and laboratory) are listed below for reference.     Microbiology: Recent Results (from the past 240 hour(s))  Culture, blood (Routine x 2)     Status: Abnormal   Collection Time: 08/16/16  1:42 PM  Result Value Ref Range Status   Specimen Description BLOOD RIGHT HAND  Final   Special Requests BOTTLES DRAWN AEROBIC AND ANAEROBIC 5 CC EA  Final   Culture  Setup Time   Final  GRAM NEGATIVE RODS ANAEROBIC BOTTLE ONLY CRITICAL RESULT CALLED TO, READ BACK BY AND VERIFIED WITH: T. Green Pharm.D. 12:40 08/17/16 ([email or web address]) Performed at [facility], [place] [address], [place], [place] [address]    Culture ENTEROBACTER AEROGENES (A)  Final   Report Status 08/19/2016 FINAL  Final   Organism ID, Bacteria ENTEROBACTER AEROGENES  Final      Susceptibility   Enterobacter aerogenes - MIC*    CEFAZOLIN >=64 RESISTANT Resistant     CEFEPIME <=1 SENSITIVE Sensitive     CEFTAZIDIME <=1 SENSITIVE Sensitive     CEFTRIAXONE <=1 SENSITIVE Sensitive     CIPROFLOXACIN <=0.25 SENSITIVE Sensitive     GENTAMICIN <=1 SENSITIVE Sensitive     IMIPENEM 2 SENSITIVE Sensitive     TRIMETH/SULFA <=20 SENSITIVE Sensitive     PIP/TAZO 8 SENSITIVE Sensitive     * ENTEROBACTER AEROGENES  Blood Culture ID Panel (Reflexed)     Status: Abnormal   Collection Time: 08/16/16  1:42 PM  Result Value Ref Range Status   Enterococcus species NOT  DETECTED NOT DETECTED Final   Listeria monocytogenes NOT DETECTED NOT DETECTED Final   Staphylococcus species NOT DETECTED NOT DETECTED Final   Staphylococcus aureus NOT DETECTED NOT DETECTED Final   Streptococcus species NOT DETECTED NOT DETECTED Final   Streptococcus agalactiae NOT DETECTED NOT DETECTED Final   Streptococcus pneumoniae NOT DETECTED NOT DETECTED Final   Streptococcus pyogenes NOT DETECTED NOT DETECTED Final   Acinetobacter baumannii NOT DETECTED NOT DETECTED Final   Enterobacteriaceae species DETECTED (A) NOT DETECTED Final    Comment: Enterobacteriaceae represent a large family of gram negative bacteria, not a single organism. Refer to culture for further identification. CRITICAL RESULT CALLED TO, READ BACK BY AND VERIFIED WITH: T. Green Pharm.D. 12:40 08/17/16 ([email or web address])    Enterobacter cloacae complex NOT DETECTED NOT DETECTED Final   Escherichia coli NOT DETECTED NOT DETECTED Final   Klebsiella oxytoca NOT DETECTED NOT DETECTED Final   Klebsiella pneumoniae NOT DETECTED NOT DETECTED Final   Proteus species NOT DETECTED NOT DETECTED Final   Serratia marcescens NOT DETECTED NOT DETECTED Final   Carbapenem resistance NOT DETECTED NOT DETECTED Final   Haemophilus influenzae NOT DETECTED NOT DETECTED Final   Neisseria meningitidis NOT DETECTED NOT DETECTED Final   Pseudomonas aeruginosa NOT DETECTED NOT DETECTED Final   Candida albicans NOT DETECTED NOT DETECTED Final   Candida glabrata NOT DETECTED NOT DETECTED Final   Candida krusei NOT DETECTED NOT DETECTED Final   Candida parapsilosis NOT DETECTED NOT DETECTED Final   Candida tropicalis NOT DETECTED NOT DETECTED Final    Comment: Performed at [facility], [place] [address] [place],  [address]  Urine culture     Status: Abnormal   Collection Time: 08/16/16  1:53 PM  Result Value Ref Range Status   Specimen Description URINE, RANDOM  Final   Special Requests NONE  Final   Culture >=100,000 COLONIES/mL  ENTEROBACTER AEROGENES (A)  Final   Report Status 08/19/2016 FINAL  Final   Organism ID, Bacteria ENTEROBACTER AEROGENES (A)  Final      Susceptibility   Enterobacter aerogenes - MIC*    CEFAZOLIN >=64 RESISTANT Resistant     CEFTRIAXONE <=1 SENSITIVE Sensitive     CIPROFLOXACIN <=0.25 SENSITIVE Sensitive     GENTAMICIN <=1 SENSITIVE Sensitive     IMIPENEM 2 SENSITIVE Sensitive     NITROFURANTOIN 64 INTERMEDIATE Intermediate     TRIMETH/SULFA <=20 SENSITIVE Sensitive     PIP/TAZO <=  4 SENSITIVE Sensitive     * >=100,000 COLONIES/mL ENTEROBACTER AEROGENES  Culture, blood (Routine x 2)     Status: None (Preliminary result)   Collection Time: 08/16/16  2:17 PM  Result Value Ref Range Status   Specimen Description BLOOD LEFT HAND  Final   Special Requests BOTTLES DRAWN AEROBIC AND ANAEROBIC 5 CC EA  Final   Culture   Final    NO GROWTH 3 DAYS Performed at [facility], [place] [address], [place], [place] [address]    Report Status PENDING  Incomplete  MRSA PCR Screening     Status: None   Collection Time: 08/16/16  8:42 PM  Result Value Ref Range Status   MRSA by PCR NEGATIVE NEGATIVE Final    Comment:        The GeneXpert MRSA Assay (FDA approved for NASAL specimens only), is one component of a comprehensive MRSA colonization surveillance program. It is not intended to diagnose MRSA infection nor to guide or monitor treatment for MRSA infections.   C difficile quick scan w PCR reflex     Status: None   Collection Time: 08/18/16  6:30 PM  Result Value Ref Range Status   C Diff antigen NEGATIVE NEGATIVE Final   C Diff toxin NEGATIVE NEGATIVE Final   C Diff interpretation No C. difficile detected.  Final     Labs: BNP (last 3 results)  Recent Labs  08/17/16 1806  BNP [id number]*   Basic Metabolic Panel:  Recent Labs Lab 08/17/16 0311 08/17/16 1806 08/18/16 0348 08/19/16 0636 08/20/16 0542  NA 135 132* 135 133* 135  K 3.2* 3.5 3.2* 3.3* 3.8  CL 108 105 108 106  108  CO2 19* 18* 20* 19* 20*  GLUCOSE 149* 166* 110* 99 99  BUN 23* 23* 24* 26* 26*  CREATININE 2.07* 2.04* 1.93* 1.55* 1.35*  CALCIUM 7.2* 7.1* 7.1* 7.5* 7.4*  MG  --   --  1.7 2.1  --    Liver Function Tests:  Recent Labs Lab 08/16/16 1344  AST 36  ALT 41  ALKPHOS 115  BILITOT 1.4*  PROT 6.3*  ALBUMIN 3.0*   No results for input(s): LIPASE, AMYLASE in the last 168 hours. No results for input(s): AMMONIA in the last 168 hours. CBC:  Recent Labs Lab 08/16/16 1344 08/16/16 2135 08/17/16 0311 08/18/16 0348 08/19/16 0636 08/20/16 0710  WBC 33.7* 39.2* 37.1* 31.5* 27.6* 20.9*  NEUTROABS 23.0*  --   --   --  16.0* 10.3*  HGB 12.3* 10.6* 11.1* 10.6* 10.6* 11.3*  HCT 36.3* 31.1* 32.9* 31.2* 31.4* 33.4*  MCV 88.5 89.1 88.7 88.9 88.2 87.4  PLT 177 166 164 160 142* 125*   Cardiac Enzymes: No results for input(s): CKTOTAL, CKMB, CKMBINDEX, TROPONINI in the last 168 hours. BNP: Invalid input(s): POCBNP CBG: No results for input(s): GLUCAP in the last 168 hours. D-Dimer No results for input(s): DDIMER in the last 72 hours. Hgb A1c No results for input(s): HGBA1C in the last 72 hours. Lipid Profile No results for input(s): CHOL, HDL, LDLCALC, TRIG, CHOLHDL, LDLDIRECT in the last 72 hours. Thyroid function studies No results for input(s): TSH, T4TOTAL, T3FREE, THYROIDAB in the last 72 hours.  Invalid input(s): FREET3 Anemia work up No results for input(s): VITAMINB12, FOLATE, FERRITIN, TIBC, IRON, RETICCTPCT in the last 72 hours. Urinalysis    Component Value Date/Time   COLORURINE AMBER (A) 08/16/2016 1353   APPEARANCEUR CLOUDY (A) 08/16/2016 1353   LABSPEC 1.015 08/16/2016 1353  PHURINE 5.0 08/16/2016 1353   GLUCOSEU NEGATIVE 08/16/2016 1353   HGBUR MODERATE (A) 08/16/2016 1353   BILIRUBINUR NEGATIVE 08/16/2016 1353   KETONESUR NEGATIVE 08/16/2016 1353   PROTEINUR 100 (A) 08/16/2016 1353   NITRITE NEGATIVE 08/16/2016 1353   LEUKOCYTESUR LARGE (A) 08/16/2016  1353   Sepsis Labs Invalid input(s): PROCALCITONIN,  WBC,  LACTICIDVEN Microbiology Recent Results (from the past 240 hour(s))  Culture, blood (Routine x 2)     Status: Abnormal   Collection Time: 08/16/16  1:42 PM  Result Value Ref Range Status   Specimen Description BLOOD RIGHT HAND  Final   Special Requests BOTTLES DRAWN AEROBIC AND ANAEROBIC 5 CC EA  Final   Culture  Setup Time   Final    GRAM NEGATIVE RODS ANAEROBIC BOTTLE ONLY CRITICAL RESULT CALLED TO, READ BACK BY AND VERIFIED WITH: T. Green Pharm.D. 12:40 08/17/16 ([email or web address]) Performed at [facility], [place] [address], [place], [place] [address]    Culture ENTEROBACTER AEROGENES (A)  Final   Report Status 08/19/2016 FINAL  Final   Organism ID, Bacteria ENTEROBACTER AEROGENES  Final      Susceptibility   Enterobacter aerogenes - MIC*    CEFAZOLIN >=64 RESISTANT Resistant     CEFEPIME <=1 SENSITIVE Sensitive     CEFTAZIDIME <=1 SENSITIVE Sensitive     CEFTRIAXONE <=1 SENSITIVE Sensitive     CIPROFLOXACIN <=0.25 SENSITIVE Sensitive     GENTAMICIN <=1 SENSITIVE Sensitive     IMIPENEM 2 SENSITIVE Sensitive     TRIMETH/SULFA <=20 SENSITIVE Sensitive     PIP/TAZO 8 SENSITIVE Sensitive     * ENTEROBACTER AEROGENES  Blood Culture ID Panel (Reflexed)     Status: Abnormal   Collection Time: 08/16/16  1:42 PM  Result Value Ref Range Status   Enterococcus species NOT DETECTED NOT DETECTED Final   Listeria monocytogenes NOT DETECTED NOT DETECTED Final   Staphylococcus species NOT DETECTED NOT DETECTED Final   Staphylococcus aureus NOT DETECTED NOT DETECTED Final   Streptococcus species NOT DETECTED NOT DETECTED Final   Streptococcus agalactiae NOT DETECTED NOT DETECTED Final   Streptococcus pneumoniae NOT DETECTED NOT DETECTED Final   Streptococcus pyogenes NOT DETECTED NOT DETECTED Final   Acinetobacter baumannii NOT DETECTED NOT DETECTED Final   Enterobacteriaceae species DETECTED (A) NOT DETECTED Final    Comment:  Enterobacteriaceae represent a large family of gram negative bacteria, not a single organism. Refer to culture for further identification. CRITICAL RESULT CALLED TO, READ BACK BY AND VERIFIED WITH: T. Green Pharm.D. 12:40 08/17/16 ([email or web address])    Enterobacter cloacae complex NOT DETECTED NOT DETECTED Final   Escherichia coli NOT DETECTED NOT DETECTED Final   Klebsiella oxytoca NOT DETECTED NOT DETECTED Final   Klebsiella pneumoniae NOT DETECTED NOT DETECTED Final   Proteus species NOT DETECTED NOT DETECTED Final   Serratia marcescens NOT DETECTED NOT DETECTED Final   Carbapenem resistance NOT DETECTED NOT DETECTED Final   Haemophilus influenzae NOT DETECTED NOT DETECTED Final   Neisseria meningitidis NOT DETECTED NOT DETECTED Final   Pseudomonas aeruginosa NOT DETECTED NOT DETECTED Final   Candida albicans NOT DETECTED NOT DETECTED Final   Candida glabrata NOT DETECTED NOT DETECTED Final   Candida krusei NOT DETECTED NOT DETECTED Final   Candida parapsilosis NOT DETECTED NOT DETECTED Final   Candida tropicalis NOT DETECTED NOT DETECTED Final    Comment: Performed at [facility], [place] [address], [place],  [address]  Urine culture     Status: Abnormal  Collection Time: 08/16/16  1:53 PM  Result Value Ref Range Status   Specimen Description URINE, RANDOM  Final   Special Requests NONE  Final   Culture >=100,000 COLONIES/mL ENTEROBACTER AEROGENES (A)  Final   Report Status 08/19/2016 FINAL  Final   Organism ID, Bacteria ENTEROBACTER AEROGENES (A)  Final      Susceptibility   Enterobacter aerogenes - MIC*    CEFAZOLIN >=64 RESISTANT Resistant     CEFTRIAXONE <=1 SENSITIVE Sensitive     CIPROFLOXACIN <=0.25 SENSITIVE Sensitive     GENTAMICIN <=1 SENSITIVE Sensitive     IMIPENEM 2 SENSITIVE Sensitive     NITROFURANTOIN 64 INTERMEDIATE Intermediate     TRIMETH/SULFA <=20 SENSITIVE Sensitive     PIP/TAZO <=4 SENSITIVE Sensitive     * >=100,000 COLONIES/mL ENTEROBACTER  AEROGENES  Culture, blood (Routine x 2)     Status: None (Preliminary result)   Collection Time: 08/16/16  2:17 PM  Result Value Ref Range Status   Specimen Description BLOOD LEFT HAND  Final   Special Requests BOTTLES DRAWN AEROBIC AND ANAEROBIC 5 CC EA  Final   Culture   Final    NO GROWTH 3 DAYS Performed at [facility], [address] [place], [place] [address]    Report Status PENDING  Incomplete  MRSA PCR Screening     Status: None   Collection Time: 08/16/16  8:42 PM  Result Value Ref Range Status   MRSA by PCR NEGATIVE NEGATIVE Final    Comment:        The GeneXpert MRSA Assay (FDA approved for NASAL specimens only), is one component of a comprehensive MRSA colonization surveillance program. It is not intended to diagnose MRSA infection nor to guide or monitor treatment for MRSA infections.   C difficile quick scan w PCR reflex     Status: None   Collection Time: 08/18/16  6:30 PM  Result Value Ref Range Status   C Diff antigen NEGATIVE NEGATIVE Final   C Diff toxin NEGATIVE NEGATIVE Final   C Diff interpretation No C. difficile detected.  Final     Time coordinating discharge: Over 30 minutes  SIGNED:   Louellen Molder, MD  [facility] 08/20/2016, 2:58 PM Pager   If 7PM-7AM, please contact night-coverage [email or web address] Password [email or web address]

## 2016-08-21 LAB — CULTURE, BLOOD (ROUTINE X 2): Culture: NO GROWTH

## 2016-11-30 ENCOUNTER — Telehealth: Payer: Self-pay | Admitting: *Deleted

## 2016-11-30 ENCOUNTER — Telehealth: Payer: Self-pay | Admitting: Oncology

## 2016-11-30 DIAGNOSIS — C9110 Chronic lymphocytic leukemia of B-cell type not having achieved remission: Secondary | ICD-10-CM

## 2016-11-30 NOTE — Telephone Encounter (Signed)
Confirmed 6/19 appt at 1330 per sch msg

## 2016-11-30 NOTE — Telephone Encounter (Signed)
Received referral via fax from Dr. Inda Merlin. Pt was last seen in 2016. Recent PLT 44k. Reviewed with Lattie Haw, NP: Order received for lab/office 6/19. Message to schedulers.

## 2016-12-06 ENCOUNTER — Ambulatory Visit (HOSPITAL_BASED_OUTPATIENT_CLINIC_OR_DEPARTMENT_OTHER): Payer: Medicare Other | Admitting: Nurse Practitioner

## 2016-12-06 ENCOUNTER — Other Ambulatory Visit (HOSPITAL_BASED_OUTPATIENT_CLINIC_OR_DEPARTMENT_OTHER): Payer: Medicare Other

## 2016-12-06 VITALS — BP 141/62 | HR 82 | Temp 97.0°F | Resp 18 | Ht 64.0 in | Wt 171.3 lb

## 2016-12-06 DIAGNOSIS — C9110 Chronic lymphocytic leukemia of B-cell type not having achieved remission: Secondary | ICD-10-CM

## 2016-12-06 DIAGNOSIS — D696 Thrombocytopenia, unspecified: Secondary | ICD-10-CM

## 2016-12-06 LAB — CBC WITH DIFFERENTIAL/PLATELET
BASO%: 0.4 % (ref 0.0–2.0)
Basophils Absolute: 0.1 10*3/uL (ref 0.0–0.1)
EOS%: 1.3 % (ref 0.0–7.0)
Eosinophils Absolute: 0.2 10*3/uL (ref 0.0–0.5)
HCT: 42 % (ref 38.4–49.9)
HGB: 14.4 g/dL (ref 13.0–17.1)
LYMPH%: 44.5 % (ref 14.0–49.0)
MCH: 31 pg (ref 27.2–33.4)
MCHC: 34.3 g/dL (ref 32.0–36.0)
MCV: 90.5 fL (ref 79.3–98.0)
MONO#: 0.6 10*3/uL (ref 0.1–0.9)
MONO%: 4.2 % (ref 0.0–14.0)
NEUT#: 7.1 10*3/uL — ABNORMAL HIGH (ref 1.5–6.5)
NEUT%: 49.6 % (ref 39.0–75.0)
Platelets: 50 10*3/uL — ABNORMAL LOW (ref 140–400)
RBC: 4.64 10*6/uL (ref 4.20–5.82)
RDW: 12.8 % (ref 11.0–14.6)
WBC: 14.4 10*3/uL — ABNORMAL HIGH (ref 4.0–10.3)
lymph#: 6.4 10*3/uL — ABNORMAL HIGH (ref 0.9–3.3)
nRBC: 0 % (ref 0–0)

## 2016-12-06 LAB — TECHNOLOGIST REVIEW

## 2016-12-06 NOTE — Progress Notes (Addendum)
[place] OFFICE PROGRESS NOTE   Diagnosis:  CLL  INTERVAL HISTORY:   [name] was last seen at the [facility] in June 2016 at which time he was diagnosed with CLL. He was asymptomatic from the CLL and no treatment was recommended. He was referred back to Dr. Inda Merlin for continued follow-up. Labs from 11/29/2016 showed a platelet count of 44,000. He was referred back to [name] for evaluation of progressive thrombocytopenia.   He was hospitalized in March of this year with sepsis/UTI and bacteremia.  He has lost some weight since the sepsis admission. He has a good appetite. He is very active. He exercises multiple times a week. No fevers or sweats. He has not noticed any enlarged lymph nodes. He denies any unusual bruising or bleeding. He reports a history of microscopic hematuria.  Objective:  Vital signs in last 24 hours:  Blood pressure (!) 141/62, pulse 82, temperature 97 F (36.1 C), temperature source Oral, resp. rate 18, height 5\' 4"  (1.626 m), weight 171 lb 5 oz (77.7 kg), SpO2 100 %.    HEENT: No thrush or ulcers. No bleeding within the oral cavity. Lymphatics: No cervical adenopathy. Small left supra clavicular lymph node. Bilateral axillary lymph nodes ranging in size from 1-3 cm. Small bilateral inguinal lymph nodes. Resp: Lungs clear bilaterally. Cardio: Regular rate and rhythm. GI: Abdomen soft and nontender. No hepatosplenomegaly. Vascular: No leg edema.  Lab Results:  Lab Results  Component Value Date   WBC 14.4 (H) 12/06/2016   HGB 14.4 12/06/2016   HCT 42.0 12/06/2016   MCV 90.5 12/06/2016   PLT 50 (L) 12/06/2016   NEUTROABS 7.1 (H) 12/06/2016    Imaging:  No results found.  Medications: I have reviewed the patient's current medications.  Assessment/Plan: 1. Lymphocytosis ? Review of the peripheral blood smear is consistent with chronic lymphocytic leukemia ? 12/16/2014 Peripheral blood flow cytometry consistent with  CLL 2. Mild thrombocytopenia-likely secondary to chronic lymphocytic leukemia versus "pseudo thrombocytopenia" due to platelet clumping; progressive thrombocytopenia June 2018 3. Hospitalization with sepsis/UTI with bacteremia (Enterobacter aerogenes) March 2018   Disposition: [name] was diagnosed with CLL approximately 2 years ago. He had mild thrombocytopenia at that time. On recent labs he was found to have progressive thrombocytopenia. This was confirmed on labs done in our office today. The white count is stable with an absolute lymphocyte count of 6.4 and a normal hemoglobin at 14.4. He has palpable peripheral adenopathy. No fevers or sweats.  We discussed that the thrombocytopenia could be related to progression of the CLL though this would be unusual given that his hemoglobin is normal. We also discussed the possibility of ITP related to the CLL.  [name] recommends continued observation with more frequent labs (CBC in 4 weeks; if the platelet count is stable in 4 weeks then repeat in 6 weeks; if stable at that time then repeat in 8 weeks with further recommendations at that time). [name] prefers to have blood work and follow-up with Dr. Inda Merlin. We did not schedule formal follow-up in our office but will be happy to see him in the future if needed. He understands to contact the office with bruising/bleeding. He and his wife expressed their understanding.  Patient seen with [name]. 25 minutes were spent face-to-face at today's visit with the majority of that time involved in counseling/coordination of care.    Ned Card ANP/GNP-BC   12/06/2016  3:05 PM  This was a shared visit with  Ned Card. [name] was interviewed and examined. He has developed moderate thrombocytopenia. This may be related to progression of CLL or development of ITP. There is no indication for treating the CLL at present. He will seek medical attention for spontaneous bleeding or  bruising. [name] would like to continue clinical follow-up with Dr. Inda Merlin. He does not wish to return for lab or office visits here and less he will require treatment for the thrombocytopenia.  We will be available to see him if he develops progressive thrombocytopenia or symptoms related to the CLL.  Julieanne Manson, M.D.

## 2016-12-14 ENCOUNTER — Telehealth: Payer: Self-pay | Admitting: *Deleted

## 2016-12-14 NOTE — Telephone Encounter (Signed)
Ok, Cbc 4 weeks and 10 weeks from last visit here

## 2016-12-14 NOTE — Telephone Encounter (Signed)
Message from pt stating [name] recommended checking lab in 4 weeks. Pt has decided he'd rather check labs at [facility]. Requesting appointment.  Will review with MD for orders.

## 2016-12-15 ENCOUNTER — Other Ambulatory Visit: Payer: Self-pay | Admitting: *Deleted

## 2016-12-15 DIAGNOSIS — C9110 Chronic lymphocytic leukemia of B-cell type not having achieved remission: Secondary | ICD-10-CM

## 2016-12-16 NOTE — Telephone Encounter (Signed)
Late entry for 6/28: Left message informing pt schedulers will contact him with appointments.

## 2017-01-03 ENCOUNTER — Other Ambulatory Visit (HOSPITAL_BASED_OUTPATIENT_CLINIC_OR_DEPARTMENT_OTHER): Payer: Medicare Other

## 2017-01-03 DIAGNOSIS — C9110 Chronic lymphocytic leukemia of B-cell type not having achieved remission: Secondary | ICD-10-CM | POA: Diagnosis not present

## 2017-01-03 DIAGNOSIS — D696 Thrombocytopenia, unspecified: Secondary | ICD-10-CM | POA: Diagnosis not present

## 2017-01-03 LAB — CBC WITH DIFFERENTIAL/PLATELET
BASO%: 0.4 % (ref 0.0–2.0)
Basophils Absolute: 0.1 10*3/uL (ref 0.0–0.1)
EOS%: 1.4 % (ref 0.0–7.0)
Eosinophils Absolute: 0.2 10*3/uL (ref 0.0–0.5)
HCT: 41.9 % (ref 38.4–49.9)
HGB: 14.2 g/dL (ref 13.0–17.1)
LYMPH%: 52.6 % — ABNORMAL HIGH (ref 14.0–49.0)
MCH: 30.5 pg (ref 27.2–33.4)
MCHC: 33.9 g/dL (ref 32.0–36.0)
MCV: 89.9 fL (ref 79.3–98.0)
MONO#: 0.8 10*3/uL (ref 0.1–0.9)
MONO%: 4.9 % (ref 0.0–14.0)
NEUT#: 6.2 10*3/uL (ref 1.5–6.5)
NEUT%: 40.7 % (ref 39.0–75.0)
Platelets: 48 10*3/uL — ABNORMAL LOW (ref 140–400)
RBC: 4.66 10*6/uL (ref 4.20–5.82)
RDW: 13 % (ref 11.0–14.6)
WBC: 15.2 10*3/uL — ABNORMAL HIGH (ref 4.0–10.3)
lymph#: 8 10*3/uL — ABNORMAL HIGH (ref 0.9–3.3)
nRBC: 1 % — ABNORMAL HIGH (ref 0–0)

## 2017-01-03 LAB — TECHNOLOGIST REVIEW

## 2017-01-04 ENCOUNTER — Telehealth: Payer: Self-pay | Admitting: *Deleted

## 2017-01-04 NOTE — Telephone Encounter (Signed)
Called pt with PLT result, per MD note below. Next appt confirmed. Instructed him to call office for bleeding or spontaneous bruising. He voiced understanding.

## 2017-01-04 NOTE — Telephone Encounter (Signed)
-----   Message from Ladell Pier, MD sent at 01/04/2017 12:43 AM EDT ----- Please call patient,platelets are stable,f/u as scheduled

## 2017-02-13 ENCOUNTER — Ambulatory Visit (HOSPITAL_BASED_OUTPATIENT_CLINIC_OR_DEPARTMENT_OTHER): Payer: Medicare Other | Admitting: Oncology

## 2017-02-13 ENCOUNTER — Other Ambulatory Visit (HOSPITAL_BASED_OUTPATIENT_CLINIC_OR_DEPARTMENT_OTHER): Payer: Medicare Other

## 2017-02-13 ENCOUNTER — Telehealth: Payer: Self-pay | Admitting: Oncology

## 2017-02-13 VITALS — BP 125/68 | HR 50 | Resp 19 | Ht 64.0 in | Wt 172.4 lb

## 2017-02-13 DIAGNOSIS — D696 Thrombocytopenia, unspecified: Secondary | ICD-10-CM | POA: Diagnosis not present

## 2017-02-13 DIAGNOSIS — C9110 Chronic lymphocytic leukemia of B-cell type not having achieved remission: Secondary | ICD-10-CM

## 2017-02-13 LAB — CBC WITH DIFFERENTIAL/PLATELET
BASO%: 0.5 % (ref 0.0–2.0)
Basophils Absolute: 0.1 10*3/uL (ref 0.0–0.1)
EOS%: 1.8 % (ref 0.0–7.0)
Eosinophils Absolute: 0.3 10*3/uL (ref 0.0–0.5)
HCT: 42 % (ref 38.4–49.9)
HGB: 13.9 g/dL (ref 13.0–17.1)
LYMPH%: 50.5 % — ABNORMAL HIGH (ref 14.0–49.0)
MCH: 30.2 pg (ref 27.2–33.4)
MCHC: 33.1 g/dL (ref 32.0–36.0)
MCV: 91.4 fL (ref 79.3–98.0)
MONO#: 0.6 10*3/uL (ref 0.1–0.9)
MONO%: 4.1 % (ref 0.0–14.0)
NEUT#: 6.4 10*3/uL (ref 1.5–6.5)
NEUT%: 43.1 % (ref 39.0–75.0)
Platelets: 41 10*3/uL — ABNORMAL LOW (ref 140–400)
RBC: 4.59 10*6/uL (ref 4.20–5.82)
RDW: 13.4 % (ref 11.0–14.6)
WBC: 14.8 10*3/uL — ABNORMAL HIGH (ref 4.0–10.3)
lymph#: 7.5 10*3/uL — ABNORMAL HIGH (ref 0.9–3.3)

## 2017-02-13 NOTE — Progress Notes (Signed)
  [place] OFFICE PROGRESS NOTE   Diagnosis: CLL  INTERVAL HISTORY:   [name] returns as scheduled. No bleeding except for minor bleeding with an abrasion at the dorsum of the left hand. No fever. He has "tingling "in the hands when he is performing exercises in the pool.  Objective:  Vital signs in last 24 hours:  Blood pressure 125/68, pulse (!) 50, resp. rate 19, height 5\' 4"  (1.626 m), weight 172 lb 6.4 oz (78.2 kg), SpO2 100 %.    HEENT: Oral cavity without bleeding or thrush Lymphatics: 1 cm right submandibular node, 0.5 cm left supraclavicular/scaling node, 1 cm bilateral axillary nodes. No inguinal nodes. Resp: Lungs clear bilaterally Cardio: Regular rate and rhythm GI: No hepatosplenomegaly, nontender Vascular: No leg edema  Skin: Small abrasions at the dorsum of the left hand without bleeding    Lab Results:  Lab Results  Component Value Date   WBC 14.8 (H) 02/13/2017   HGB 13.9 02/13/2017   HCT 42.0 02/13/2017   MCV 91.4 02/13/2017   PLT 41 (L) 02/13/2017   NEUTROABS 6.4 02/13/2017    Medications: I have reviewed the patient's current medications.  Assessment/Plan: 1. Lymphocytosis ? Review of the peripheral blood smear is consistent with chronic lymphocytic leukemia ? 12/16/2014 Peripheral blood flow cytometry consistent with CLL 2. Mild thrombocytopenia-likely secondary to chronic lymphocytic leukemia versus "pseudo thrombocytopenia" due to platelet clumping; progressive thrombocytopenia June 2018 3. Hospitalization with sepsis/UTI with bacteremia (Enterobacter aerogenes) March 2018   Disposition:  He appears unchanged. He has moderate thrombocytopenia. 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 There is no other indication for treating the CLL. He will contact us for spontaneous bleeding or bruising.  He will return in 3 weeks for a CBC and 6 weeks for an office visit.  We will begin treatment was steroids if  the platelet count falls further.  15 minutes were spent with the patient today. The majority of the time was used for counseling and coordination of care.  Donneta Romberg, MD  02/13/2017  2:24 PM

## 2017-02-13 NOTE — Telephone Encounter (Signed)
Spoke with patient regarding his upcoming appts. Did not want avs or calendar.

## 2017-03-08 ENCOUNTER — Other Ambulatory Visit (HOSPITAL_BASED_OUTPATIENT_CLINIC_OR_DEPARTMENT_OTHER): Payer: Medicare Other

## 2017-03-08 DIAGNOSIS — C9110 Chronic lymphocytic leukemia of B-cell type not having achieved remission: Secondary | ICD-10-CM

## 2017-03-08 DIAGNOSIS — D696 Thrombocytopenia, unspecified: Secondary | ICD-10-CM | POA: Diagnosis not present

## 2017-03-08 LAB — CBC WITH DIFFERENTIAL/PLATELET
BASO%: 0.6 % (ref 0.0–2.0)
Basophils Absolute: 0.1 10*3/uL (ref 0.0–0.1)
EOS%: 1.5 % (ref 0.0–7.0)
Eosinophils Absolute: 0.3 10*3/uL (ref 0.0–0.5)
HCT: 43 % (ref 38.4–49.9)
HGB: 14.3 g/dL (ref 13.0–17.1)
LYMPH%: 60.3 % — ABNORMAL HIGH (ref 14.0–49.0)
MCH: 30.5 pg (ref 27.2–33.4)
MCHC: 33.2 g/dL (ref 32.0–36.0)
MCV: 91.8 fL (ref 79.3–98.0)
MONO#: 0.9 10*3/uL (ref 0.1–0.9)
MONO%: 4.8 % (ref 0.0–14.0)
NEUT#: 6 10*3/uL (ref 1.5–6.5)
NEUT%: 32.8 % — ABNORMAL LOW (ref 39.0–75.0)
Platelets: 46 10*3/uL — ABNORMAL LOW (ref 140–400)
RBC: 4.69 10*6/uL (ref 4.20–5.82)
RDW: 13.4 % (ref 11.0–14.6)
WBC: 18.1 10*3/uL — ABNORMAL HIGH (ref 4.0–10.3)
lymph#: 10.9 10*3/uL — ABNORMAL HIGH (ref 0.9–3.3)

## 2017-03-08 LAB — TECHNOLOGIST REVIEW

## 2017-03-09 ENCOUNTER — Telehealth: Payer: Self-pay | Admitting: Emergency Medicine

## 2017-03-09 NOTE — Telephone Encounter (Signed)
Pt returned call, he was aware of CBC result. He received a copy from phlebotomist. He understands to call office for bleeding, next appointment confirmed.

## 2017-03-09 NOTE — Telephone Encounter (Addendum)
This nurse has left the patient 2 VM. Awaiting return call to discuss this note.    ----- Message from Ladell Pier, MD sent at 03/08/2017  1:08 PM EDT ----- Please call patient, platelets are stable, f/u as scheduled, call for bleeding

## 2017-04-04 ENCOUNTER — Telehealth: Payer: Self-pay | Admitting: Oncology

## 2017-04-04 ENCOUNTER — Ambulatory Visit (HOSPITAL_BASED_OUTPATIENT_CLINIC_OR_DEPARTMENT_OTHER): Payer: Medicare Other | Admitting: Oncology

## 2017-04-04 ENCOUNTER — Other Ambulatory Visit (HOSPITAL_BASED_OUTPATIENT_CLINIC_OR_DEPARTMENT_OTHER): Payer: Medicare Other

## 2017-04-04 VITALS — BP 138/75 | HR 53 | Temp 97.8°F | Resp 18 | Ht 64.0 in | Wt 174.8 lb

## 2017-04-04 DIAGNOSIS — C9110 Chronic lymphocytic leukemia of B-cell type not having achieved remission: Secondary | ICD-10-CM

## 2017-04-04 DIAGNOSIS — D696 Thrombocytopenia, unspecified: Secondary | ICD-10-CM | POA: Diagnosis not present

## 2017-04-04 LAB — CBC WITH DIFFERENTIAL/PLATELET
BASO%: 0.4 % (ref 0.0–2.0)
Basophils Absolute: 0.1 10*3/uL (ref 0.0–0.1)
EOS%: 1.2 % (ref 0.0–7.0)
Eosinophils Absolute: 0.3 10*3/uL (ref 0.0–0.5)
HCT: 43.4 % (ref 38.4–49.9)
HGB: 14.3 g/dL (ref 13.0–17.1)
LYMPH%: 65.9 % — ABNORMAL HIGH (ref 14.0–49.0)
MCH: 30.3 pg (ref 27.2–33.4)
MCHC: 32.9 g/dL (ref 32.0–36.0)
MCV: 92.1 fL (ref 79.3–98.0)
MONO#: 0.9 10*3/uL (ref 0.1–0.9)
MONO%: 4.4 % (ref 0.0–14.0)
NEUT#: 6.1 10*3/uL (ref 1.5–6.5)
NEUT%: 28.1 % — ABNORMAL LOW (ref 39.0–75.0)
Platelets: 38 10*3/uL — ABNORMAL LOW (ref 140–400)
RBC: 4.72 10*6/uL (ref 4.20–5.82)
RDW: 13.7 % (ref 11.0–14.6)
WBC: 21.5 10*3/uL — ABNORMAL HIGH (ref 4.0–10.3)
lymph#: 14.2 10*3/uL — ABNORMAL HIGH (ref 0.9–3.3)

## 2017-04-04 LAB — TECHNOLOGIST REVIEW

## 2017-04-04 NOTE — Progress Notes (Signed)
  [place] OFFICE PROGRESS NOTE   Diagnosis:  CLL, thrombocytopenia  INTERVAL HISTORY:   [name] returns as scheduled. He feels well. No fever, night sweats, or anorexia. He can palpate small lymph nodes in the neck. No bleeding.  Objective:  Vital signs in last 24 hours:  Blood pressure 138/75, pulse (!) 53, temperature 97.8 F (36.6 C), temperature source Oral, resp. rate 18, height 5\' 4"  (1.626 m), weight 174 lb 12.8 oz (79.3 kg), SpO2 99 %.    HEENT: oral cavity without thrush or bleeding Lymphatics: 1 cm right submandibular and left scalene nodes, 1-2 cm bilateral axillary nodes. No inguinal or femoral nodes. Resp: lungs clear bilaterally Cardio: regular rate and rhythm GI: no hepatosplenomegaly Vascular: no leg edema   Lab Results:  Lab Results  Component Value Date   WBC 21.5 (H) 04/04/2017   HGB 14.3 04/04/2017   HCT 43.4 04/04/2017   MCV 92.1 04/04/2017   PLT 38 (L) 04/04/2017   NEUTROABS 6.1 04/04/2017      Medications: I have reviewed the patient's current medications.  Assessment/Plan: 1. Lymphocytosis ? Review of the peripheral blood smear is consistent with chronic lymphocytic leukemia ? 12/16/2014 Peripheral blood flow cytometry consistent with CLL 2. thrombocytopenia-likely secondary to chronic lymphocytic leukemia versus "pseudo thrombocytopenia"due to platelet clumping; progressive thrombocytopenia June 2018 3. Hospitalization with sepsis/UTI with bacteremia (Enterobacter aerogenes)March 2018    Disposition:  [name] appears stable. He is asymptomatic from the CLL. The thrombocytopenia is most likely related to ITP in the setting of CLL. He will contact us for spontaneous bleeding or bruising. We will initiate steroid therapy if the platelet count falls. It is possible the thrombus cytopenias related to direct bone marrow involvement with CLL, but I think this is unlikely even the normal hemoglobin and mild  lymphocytosis. He will return for a CBC in 6 weeks and an office visit in 3 months.  He is up-to-date on the pneumonia vaccines and reports receiving an influenza vaccine this year.  Donneta Romberg, MD  04/04/2017  8:24 AM

## 2017-04-04 NOTE — Telephone Encounter (Signed)
Gave avs and calendar for November and January 2019 °

## 2017-04-11 ENCOUNTER — Telehealth: Payer: Self-pay | Admitting: *Deleted

## 2017-04-11 NOTE — Telephone Encounter (Addendum)
Call from pt requesting [name] contact his daughter to discuss his labs and recommendations. Pt's daughter Jacob Cicero is a PCP in [place] and has "general questions" for [name]. Cell # [phone] Message forwarded to MD.

## 2017-05-17 ENCOUNTER — Other Ambulatory Visit (HOSPITAL_BASED_OUTPATIENT_CLINIC_OR_DEPARTMENT_OTHER): Payer: Medicare Other

## 2017-05-17 DIAGNOSIS — D696 Thrombocytopenia, unspecified: Secondary | ICD-10-CM | POA: Diagnosis not present

## 2017-05-17 DIAGNOSIS — C9110 Chronic lymphocytic leukemia of B-cell type not having achieved remission: Secondary | ICD-10-CM

## 2017-05-17 LAB — CBC WITH DIFFERENTIAL/PLATELET
BASO%: 0.4 % (ref 0.0–2.0)
Basophils Absolute: 0.1 10*3/uL (ref 0.0–0.1)
EOS%: 1.1 % (ref 0.0–7.0)
Eosinophils Absolute: 0.3 10*3/uL (ref 0.0–0.5)
HCT: 43.7 % (ref 38.4–49.9)
HGB: 14.3 g/dL (ref 13.0–17.1)
LYMPH%: 69.3 % — ABNORMAL HIGH (ref 14.0–49.0)
MCH: 30.3 pg (ref 27.2–33.4)
MCHC: 32.8 g/dL (ref 32.0–36.0)
MCV: 92.3 fL (ref 79.3–98.0)
MONO#: 0.9 10*3/uL (ref 0.1–0.9)
MONO%: 3.9 % (ref 0.0–14.0)
NEUT#: 5.7 10*3/uL (ref 1.5–6.5)
NEUT%: 25.3 % — ABNORMAL LOW (ref 39.0–75.0)
Platelets: 36 10*3/uL — ABNORMAL LOW (ref 140–400)
RBC: 4.73 10*6/uL (ref 4.20–5.82)
RDW: 12.9 % (ref 11.0–14.6)
WBC: 22.3 10*3/uL — ABNORMAL HIGH (ref 4.0–10.3)
lymph#: 15.4 10*3/uL — ABNORMAL HIGH (ref 0.9–3.3)

## 2017-05-17 LAB — TECHNOLOGIST REVIEW

## 2017-05-18 ENCOUNTER — Telehealth: Payer: Self-pay | Admitting: Emergency Medicine

## 2017-05-18 NOTE — Telephone Encounter (Addendum)
Left VM regarding this note.   ----- Message from Ladell Pier, MD sent at 05/17/2017  7:38 PM EST ----- Please call patient, platelets are stable, call for bruising or bleeding check cbc 1 month

## 2017-06-23 ENCOUNTER — Other Ambulatory Visit (HOSPITAL_BASED_OUTPATIENT_CLINIC_OR_DEPARTMENT_OTHER): Payer: Medicare Other

## 2017-06-23 ENCOUNTER — Inpatient Hospital Stay: Payer: Medicare Other | Attending: Oncology | Admitting: Oncology

## 2017-06-23 VITALS — BP 141/73 | HR 51 | Temp 98.8°F | Resp 17 | Ht 64.0 in | Wt 173.8 lb

## 2017-06-23 DIAGNOSIS — D696 Thrombocytopenia, unspecified: Secondary | ICD-10-CM | POA: Diagnosis not present

## 2017-06-23 DIAGNOSIS — C9110 Chronic lymphocytic leukemia of B-cell type not having achieved remission: Secondary | ICD-10-CM | POA: Diagnosis not present

## 2017-06-23 LAB — CBC WITH DIFFERENTIAL/PLATELET
BASO%: 0.3 % (ref 0.0–2.0)
Basophils Absolute: 0.1 10*3/uL (ref 0.0–0.1)
EOS%: 1.3 % (ref 0.0–7.0)
Eosinophils Absolute: 0.4 10*3/uL (ref 0.0–0.5)
HCT: 42.2 % (ref 38.4–49.9)
HGB: 14.1 g/dL (ref 13.0–17.1)
LYMPH%: 72.1 % — ABNORMAL HIGH (ref 14.0–49.0)
MCH: 31 pg (ref 27.2–33.4)
MCHC: 33.4 g/dL (ref 32.0–36.0)
MCV: 92.7 fL (ref 79.3–98.0)
MONO#: 1 10*3/uL — ABNORMAL HIGH (ref 0.1–0.9)
MONO%: 3.6 % (ref 0.0–14.0)
NEUT#: 6.1 10*3/uL (ref 1.5–6.5)
NEUT%: 22.7 % — ABNORMAL LOW (ref 39.0–75.0)
Platelets: 37 10*3/uL — ABNORMAL LOW (ref 140–400)
RBC: 4.55 10*6/uL (ref 4.20–5.82)
RDW: 13.1 % (ref 11.0–14.6)
WBC: 26.7 10*3/uL — ABNORMAL HIGH (ref 4.0–10.3)
lymph#: 19.2 10*3/uL — ABNORMAL HIGH (ref 0.9–3.3)
nRBC: 0 % (ref 0–0)

## 2017-06-23 LAB — TECHNOLOGIST REVIEW

## 2017-06-23 NOTE — Progress Notes (Signed)
  [place] OFFICE PROGRESS NOTE   Diagnosis: CLL  INTERVAL HISTORY:   [name] turns as scheduled.  He feels well.  No fever, night sweats, or bleeding.  He has noted small lymph nodes in the neck.  He reports intermittent "tingling "in the fingers and hands.  No weakness.  Objective:  Vital signs in last 24 hours:  Blood pressure (!) 141/73, pulse (!) 51, temperature 98.8 F (37.1 C), temperature source Oral, resp. rate 17, height 5\' 4"  (1.626 m), weight 173 lb 12.8 oz (78.8 kg), SpO2 97 %.    HEENT: Neck without mass Lymphatics: 1 cm right cervical node inferior to the right submandibular gland, small soft nodular lesions move over both clavicles.  1-2 cm bilateral axillary nodes.  No inguinal nodes. Resp: Lungs clear bilaterally Cardio: Regular rate and rhythm GI: No hepatosplenomegaly, nontender Vascular: No leg edema Neuro: The grip and hand strength appears intact bilaterally Musculoskeletal: Mild synovial hypertrophy at the PIP joints bilaterally   Lab Results:  Lab Results  Component Value Date   WBC 26.7 (H) 06/23/2017   HGB 14.1 06/23/2017   HCT 42.2 06/23/2017   MCV 92.7 06/23/2017   PLT 37 Large & giant platelets (L) 06/23/2017   NEUTROABS 6.1 06/23/2017     Medications: I have reviewed the patient's current medications.   Assessment/Plan:  1. Lymphocytosis ? Review of the peripheral blood smear is consistent with chronic lymphocytic leukemia ? 12/16/2014 Peripheral blood flow cytometry consistent with CLL 2. thrombocytopenia-likely secondary to chronic lymphocytic leukemia versus "pseudo thrombocytopenia"due to platelet clumping; progressive thrombocytopenia June 2018 3. Hospitalization with sepsis/UTI with bacteremia (Enterobacter aerogenes)March 2018  Disposition: [name] appears stable.  He is asymptomatic from the CLL and stable from a hematologic standpoint.  There is no indication for treating the CLL at present.  The  thrombocytopenia is likely related to CLL associated ITP.  He will contact us for new symptoms.  He will return for a lab visit in 2 months and an office visit in 4 months.  He plans to see Dr. Inda Merlin to evaluate the "tingling "in the hands and fingers.  He appears to have arthritis in the fingers.  [name] Coder, MD  06/23/2017  8:35 AM

## 2017-06-28 ENCOUNTER — Telehealth: Payer: Self-pay | Admitting: Oncology

## 2017-06-28 NOTE — Telephone Encounter (Signed)
Spoke with patient re appointments for March and May

## 2017-08-17 ENCOUNTER — Encounter: Payer: Self-pay | Admitting: Neurology

## 2017-08-17 ENCOUNTER — Ambulatory Visit: Payer: Medicare Other | Admitting: Neurology

## 2017-08-17 ENCOUNTER — Encounter (INDEPENDENT_AMBULATORY_CARE_PROVIDER_SITE_OTHER): Payer: Self-pay

## 2017-08-17 VITALS — BP 148/86 | HR 54 | Ht 63.5 in | Wt 170.0 lb

## 2017-08-17 DIAGNOSIS — R202 Paresthesia of skin: Secondary | ICD-10-CM

## 2017-08-17 NOTE — Progress Notes (Signed)
Reason for visit: Hand numbness  Referring physician: Dr. Teresa Pelton [name] is a 77 y.o. male  History of present illness:  [name] is a 77 year old right-handed white male with a history of onset of some numbness involving the right hand primarily 1 year ago.  He has had some slight numbness in the left hand at times.  He will note that the numbness will come and go, he will notice the problem when he is doing water aerobics, or typing on a computer.  At night he may also have some numbness.  The patient denies any pain or discomfort, he has no neck pain or shoulder discomfort or discomfort or numbness down the arms.  He denies any numbness on the body or on the face and he has no numbness in the feet.  He denies any balance issues or difficulty controlling the bowels or the bladder.  He has a prior history of lumbosacral spine surgery at the L4-5 level, he is not having much of a problem with this issue now.  The patient has occasional tremors involving the left greater than right hand.  He believes that over the last year the numbness in the right hand is actually improved some.  He is sent to this office for further evaluation.  Past Medical History:  Diagnosis Date  . Arthritis   . GERD (gastroesophageal reflux disease)   . Hepatitis 1966   Drug reaction after taking medication  . Hyperlipemia   . Hypertension   . Lung nodule seen on imaging study    bilateral lungs    Past Surgical History:  Procedure Laterality Date  . APPENDECTOMY    . arthroscoyp  2000   left knee  . COLONOSCOPY W/ POLYPECTOMY    . LUMBAR LAMINECTOMY/DECOMPRESSION MICRODISCECTOMY  06/27/2012   Procedure: LUMBAR LAMINECTOMY/DECOMPRESSION MICRODISCECTOMY 1 LEVEL;  Surgeon: [name], MD;  Location: [facility];  Service: Neurosurgery;  Laterality: Bilateral;  bilateral four-five laminectony  . SKIN CANCER EXCISION  5-6 years ago   moe-s surgery- basal b  . TONSILLECTOMY      Family History    Problem Relation Age of Onset  . Sudden death Mother 56       possible botulism  . Dementia Father   . Seizures Sister     Social history:  reports that  has never smoked. he has never used smokeless tobacco. He reports that he drinks about 1.2 oz of alcohol per week. He reports that he does not use drugs.  Medications:  Prior to Admission medications   Medication Sig Start Date End Date Taking? Authorizing Provider  bisoprolol-hydrochlorothiazide (ZIAC) 10-6.25 MG per tablet Take 1 tablet by mouth daily.   Yes [provider]  Cholecalciferol (VITAMIN D) 2000 units CAPS Take by mouth daily.   Yes [provider]  loratadine (CLARITIN) 10 MG tablet Take 10 mg by mouth daily.   Yes [provider]  simvastatin (ZOCOR) 40 MG tablet Take 20 mg by mouth every evening.   Yes [provider]  Tamsulosin HCl (FLOMAX) 0.4 MG CAPS Take 1 capsule (0.4 mg total) by mouth daily. Patient taking differently: Take 0.4 mg by mouth daily after supper.  06/30/12  Yes Jovita Gamma, MD  EPINEPHrine (EPIPEN) 0.3 mg/0.3 mL DEVI Inject 0.3 mLs (0.3 mg total) into the muscle once. Patient not taking: Reported on 08/17/2017 05/26/12   Rise Mu, PA-C     No Known Allergies  ROS:  Out of a complete 14 system review of symptoms, the patient complains only of the following symptoms, and all other reviewed systems are negative.  Tremor Numbness  Blood pressure (!) 148/86, pulse (!) 54, height 5' 3.5" (1.613 m), weight 170 lb (77.1 kg).  Physical Exam  General: The patient is alert and cooperative at the time of the examination.  Eyes: Pupils are equal, round, and reactive to light. Discs are flat bilaterally.  Neck: The neck is supple, no carotid bruits are noted.  Respiratory: The respiratory examination is clear.  Cardiovascular: The cardiovascular examination reveals a regular rate and rhythm, no obvious murmurs or rubs are noted.   Neuromuscular: Range of  movement the cervical spine is full.  Skin: Extremities are without significant edema.  Neurologic Exam  Mental status: The patient is alert and oriented x 3 at the time of the examination. The patient has apparent normal recent and remote memory, with an apparently normal attention span and concentration ability.  Cranial nerves: Facial symmetry is present. There is good sensation of the face to pinprick and soft touch bilaterally. The strength of the facial muscles and the muscles to head turning and shoulder shrug are normal bilaterally. Speech is well enunciated, no aphasia or dysarthria is noted. Extraocular movements are full. Visual fields are full. The tongue is midline, and the patient has symmetric elevation of the soft palate. No obvious hearing deficits are noted.  Motor: The motor testing reveals 5 over 5 strength of all 4 extremities. Good symmetric motor tone is noted throughout.  Sensory: Sensory testing is intact to pinprick, soft touch, vibration sensation, and position sense on all 4 extremities. No evidence of extinction is noted.  Coordination: Cerebellar testing reveals good finger-nose-finger and heel-to-shin bilaterally.  Tinel sign is negative at the wrists bilaterally.  Gait and station: Gait is normal. Tandem gait is normal. Romberg is negative. No drift is seen.  Reflexes: Deep tendon reflexes are symmetric and normal bilaterally. Toes are downgoing bilaterally.   Assessment/Plan:  1.  Right greater than left hand numbness  2. Hand tremor  The patient could have very mild carpal tunnel syndrome, Tinel sign at the wrists were negative bilaterally.  The patient will be set up for nerve conduction studies on both arms, EMG on the right arm.  He will follow-up for the above study.  He does have a mild tremor, if this worsens over time treatment may be indicated.  Jill Alexanders MD 08/17/2017 8:49 AM  [facility] [address] [place], [place] [address]  Phone [phone] Fax [phone]

## 2017-08-17 NOTE — Patient Instructions (Signed)
   We will get EMG and NCV study to look at the nerve function of the arms. 

## 2017-08-22 ENCOUNTER — Telehealth: Payer: Self-pay | Admitting: *Deleted

## 2017-08-22 ENCOUNTER — Inpatient Hospital Stay: Payer: Medicare Other | Attending: Oncology

## 2017-08-22 DIAGNOSIS — C9190 Lymphoid leukemia, unspecified not having achieved remission: Secondary | ICD-10-CM | POA: Diagnosis present

## 2017-08-22 DIAGNOSIS — C9160 Prolymphocytic leukemia of T-cell type not having achieved remission: Secondary | ICD-10-CM | POA: Diagnosis not present

## 2017-08-22 DIAGNOSIS — C9110 Chronic lymphocytic leukemia of B-cell type not having achieved remission: Secondary | ICD-10-CM

## 2017-08-22 LAB — CBC WITH DIFFERENTIAL/PLATELET
Basophils Absolute: 0 10*3/uL (ref 0.0–0.1)
Basophils Relative: 0 %
Eosinophils Absolute: 1.7 10*3/uL — ABNORMAL HIGH (ref 0.0–0.5)
Eosinophils Relative: 5 %
HCT: 42.8 % (ref 38.4–49.9)
Hemoglobin: 14.1 g/dL (ref 13.0–17.1)
Lymphocytes Relative: 74 %
Lymphs Abs: 27.5 10*3/uL — ABNORMAL HIGH (ref 0.9–3.3)
MCH: 30.9 pg (ref 27.2–33.4)
MCHC: 32.9 g/dL (ref 32.0–36.0)
MCV: 93.9 fL (ref 79.3–98.0)
Monocytes Absolute: 0.8 10*3/uL (ref 0.1–0.9)
Monocytes Relative: 2 %
Neutro Abs: 6.8 10*3/uL — ABNORMAL HIGH (ref 1.5–6.5)
Neutrophils Relative %: 19 %
Platelets: 37 10*3/uL — ABNORMAL LOW (ref 140–400)
RBC: 4.56 MIL/uL (ref 4.20–5.82)
RDW: 13.6 % (ref 11.0–14.6)
WBC: 36.9 10*3/uL — ABNORMAL HIGH (ref 4.0–10.3)

## 2017-08-22 NOTE — Telephone Encounter (Signed)
-----   Message from Ladell Pier, MD sent at 08/22/2017 10:02 AM EST ----- Please call patient, platelets are stable, follow-up as scheduled

## 2017-08-22 NOTE — Telephone Encounter (Signed)
Spoke with pt, lab result given. He understands to follow up as scheduled.

## 2017-10-04 ENCOUNTER — Encounter: Payer: Medicare Other | Admitting: Neurology

## 2017-10-09 ENCOUNTER — Ambulatory Visit: Payer: Medicare Other | Admitting: Neurology

## 2017-10-09 ENCOUNTER — Encounter: Payer: Self-pay | Admitting: Neurology

## 2017-10-09 ENCOUNTER — Ambulatory Visit (INDEPENDENT_AMBULATORY_CARE_PROVIDER_SITE_OTHER): Payer: Medicare Other | Admitting: Neurology

## 2017-10-09 DIAGNOSIS — R202 Paresthesia of skin: Secondary | ICD-10-CM

## 2017-10-09 DIAGNOSIS — G5603 Carpal tunnel syndrome, bilateral upper limbs: Secondary | ICD-10-CM

## 2017-10-09 HISTORY — DX: Carpal tunnel syndrome, bilateral upper limbs: G56.03

## 2017-10-09 NOTE — Procedures (Signed)
     HISTORY:  [name] is a 77 year old gentleman with a history of intermittent numbness primarily involving the right hand.  The patient denies any neck pain or pain down the right arm, he is being evaluated for possible carpal tunnel syndrome versus a cervical radiculopathy.  NERVE CONDUCTION STUDIES:  Nerve conduction studies were performed on both upper extremities.  The distal motor latencies for the median nerves were prolonged bilaterally with a slightly low motor amplitudes on the left, normal on the right.  The distal motor latencies and motor amplitudes for the ulnar nerves were normal bilaterally.  The nerve conduction velocities for the median and ulnar nerves were within normal limits bilaterally.  The sensory latencies for the median nerves were prolonged bilaterally, normal for the ulnar nerves bilaterally.  The F-wave latencies for the ulnar nerves were within normal limits bilaterally.  EMG STUDIES:  EMG study was performed on the right upper extremity:  The first dorsal interosseous muscle reveals 2 to 4 K units with full recruitment. No fibrillations or positive waves were noted. The abductor pollicis brevis muscle reveals 2 to 4 K units with slightly reduced recruitment. No fibrillations or positive waves were noted. The extensor indicis proprius muscle reveals 1 to 3 K units with full recruitment. No fibrillations or positive waves were noted. The pronator teres muscle reveals 2 to 4 K units with decreased recruitment. No fibrillations or positive waves were noted. The biceps muscle reveals 1 to 2 K units with full recruitment. No fibrillations or positive waves were noted. The triceps muscle reveals up to 8 K units with decreased recruitment. No fibrillations or positive waves were noted. The anterior deltoid muscle reveals 2 to 3 K units with full recruitment. No fibrillations or positive waves were noted. The cervical paraspinal muscles were tested at 2 levels. No  abnormalities of insertional activity were seen at either level tested. There was good relaxation.   IMPRESSION:  Nerve conduction studies done on both upper extremities shows evidence of mild bilateral carpal tunnel syndrome.  EMG evaluation of the right upper extremity shows findings that are consistent with a chronic stable C7 radiculopathy.  Jill Alexanders MD 10/09/2017 1:49 PM  [facility] [address] [place], [place] [address]  Phone [phone] Fax [phone]

## 2017-10-09 NOTE — Progress Notes (Addendum)
The patient comes in for EMG and nerve conduction study evaluation today.  Nerve conduction studies show bilateral mild carpal tunnel syndrome, the patient is symptomatic only on the right.  EMG on the right arm shows evidence of a chronic stable C7 radiculopathy as well, the patient is not complaining of any neck pain or pain down the right arm.  He will use wrist splints for the wrists, he will call me in 2 months if he is not getting better.     [place]    Nerve / Sites Muscle Latency Ref. Amplitude Ref. Rel Amp Segments Distance Velocity Ref. Area    ms ms mV mV %  cm m/s m/s mVms  L Median - APB     Wrist APB 4.5 ?4.4 3.9 ?4.0 100 Wrist - APB 7   14.1     Upper arm APB 8.6  3.9  100 Upper arm - Wrist 20 49 ?49 13.8  R Median - APB     Wrist APB 4.2 ?4.4 4.5 ?4.0 100 Wrist - APB 7   17.7     Upper arm APB 8.4  4.2  92.1 Upper arm - Wrist 21 49 ?49 16.4  L Ulnar - ADM     Wrist ADM 2.8 ?3.3 7.7 ?6.0 100 Wrist - ADM 7   24.3     B.Elbow ADM 6.3  7.3  95 B.Elbow - Wrist 18 52 ?49 23.4     A.Elbow ADM 8.3  7.0  95.3 A.Elbow - B.Elbow 10 49 ?49 23.2         A.Elbow - Wrist      R Ulnar - ADM     Wrist ADM 2.7 ?3.3 5.9 ?6.0 100 Wrist - ADM 7   18.8     B.Elbow ADM 6.8  5.7  96 B.Elbow - Wrist 20 49 ?49 17.7     A.Elbow ADM 9.2  5.2  92.6 A.Elbow - B.Elbow 12 50 ?49 15.5         A.Elbow - Wrist                 SNC    Nerve / Sites Rec. Site Peak Lat Ref.  Amp Ref. Segments Distance    ms ms V V  cm  L Median - Orthodromic (Dig II, Mid palm)     Dig II Wrist 4.6 ?3.4 5 ?10 Dig II - Wrist 13  R Median - Orthodromic (Dig II, Mid palm)     Dig II Wrist 4.4 ?3.4 2 ?10 Dig II - Wrist 13  L Ulnar - Orthodromic, (Dig V, Mid palm)     Dig V Wrist 2.7 ?3.1 7 ?5 Dig V - Wrist 11  R Ulnar - Orthodromic, (Dig V, Mid palm)     Dig V Wrist 3.0 ?3.1 5 ?5 Dig V - Wrist 30              F  Wave    Nerve F Lat Ref.   ms ms  L Ulnar - ADM 30.9 ?32.0  R Ulnar - ADM 30.1 ?32.0         EMG full

## 2017-10-09 NOTE — Progress Notes (Signed)
Please refer to EMG and nerve conduction study procedure note. 

## 2017-10-24 ENCOUNTER — Inpatient Hospital Stay: Payer: Medicare Other

## 2017-10-24 ENCOUNTER — Telehealth: Payer: Self-pay | Admitting: Oncology

## 2017-10-24 ENCOUNTER — Inpatient Hospital Stay: Payer: Medicare Other | Attending: Oncology | Admitting: Oncology

## 2017-10-24 VITALS — BP 121/65 | HR 56 | Temp 98.4°F | Resp 18 | Ht 63.5 in | Wt 168.0 lb

## 2017-10-24 DIAGNOSIS — C9110 Chronic lymphocytic leukemia of B-cell type not having achieved remission: Secondary | ICD-10-CM

## 2017-10-24 DIAGNOSIS — D696 Thrombocytopenia, unspecified: Secondary | ICD-10-CM | POA: Diagnosis not present

## 2017-10-24 LAB — CBC WITH DIFFERENTIAL/PLATELET
Basophils Absolute: 0.1 10*3/uL (ref 0.0–0.1)
Basophils Relative: 0 %
Eosinophils Absolute: 0.5 10*3/uL (ref 0.0–0.5)
Eosinophils Relative: 2 %
HCT: 42.9 % (ref 38.4–49.9)
Hemoglobin: 14.1 g/dL (ref 13.0–17.1)
Lymphocytes Relative: 67 %
Lymphs Abs: 15.7 10*3/uL — ABNORMAL HIGH (ref 0.9–3.3)
MCH: 30.3 pg (ref 27.2–33.4)
MCHC: 32.8 g/dL (ref 32.0–36.0)
MCV: 92.2 fL (ref 79.3–98.0)
Monocytes Absolute: 0.6 10*3/uL (ref 0.1–0.9)
Monocytes Relative: 3 %
Neutro Abs: 6.5 10*3/uL (ref 1.5–6.5)
Neutrophils Relative %: 28 %
Platelets: 38 10*3/uL — ABNORMAL LOW (ref 140–400)
RBC: 4.65 MIL/uL (ref 4.20–5.82)
RDW: 13 % (ref 11.0–14.6)
WBC: 23.5 10*3/uL — ABNORMAL HIGH (ref 4.0–10.3)

## 2017-10-24 NOTE — Progress Notes (Signed)
  [place] OFFICE PROGRESS NOTE   Diagnosis: CLL, thrombocytopenia  INTERVAL HISTORY:   [name] returns as scheduled.  He reports being diagnosed with carpal tunnel syndrome by [name].  No fever or night sweats.  He reports mild soreness in the right upper neck.  Good appetite.  He reports intentional weight loss.  No recent infection. He had a single ecchymosis at the right arm.  This has resolved.  Objective:  Vital signs in last 24 hours:  Blood pressure 121/65, pulse (!) 56, temperature 98.4 F (36.9 C), temperature source Oral, resp. rate 18, height 5' 3.5" (1.613 m), weight 168 lb (76.2 kg), SpO2 100 %.    HEENT: Neck without mass Lymphatics: Soft 1/2-1 cm right anterior cervical and right submandibular nodes.  1-2 cm bilateral axillary nodes.?  1 cm high lateral bilateral inguinal nodes Resp: Lungs clear bilaterally Cardio: Regular rate and rhythm GI: No hepatosplenomegaly Vascular: No leg edema  Skin: No ecchymoses   Lab Results:  Lab Results  Component Value Date   WBC 23.5 (H) 10/24/2017   HGB 14.1 10/24/2017   HCT 42.9 10/24/2017   MCV 92.2 10/24/2017   PLT 38 (L) 10/24/2017   NEUTROABS 6.5 10/24/2017    CMP     Component Value Date/Time   NA 135 08/20/2016 0542   NA 140 12/16/2014 1527   K 3.8 08/20/2016 0542   K 4.1 12/16/2014 1527   CL 108 08/20/2016 0542   CO2 20 (L) 08/20/2016 0542   CO2 24 12/16/2014 1527   GLUCOSE 99 08/20/2016 0542   GLUCOSE 71 12/16/2014 1527   BUN 26 (H) 08/20/2016 0542   BUN 17.2 12/16/2014 1527   CREATININE 1.35 (H) 08/20/2016 0542   CREATININE 1.3 12/16/2014 1527   CALCIUM 7.4 (L) 08/20/2016 0542   CALCIUM 8.7 12/16/2014 1527   PROT 6.3 (L) 08/16/2016 1344   ALBUMIN 3.0 (L) 08/16/2016 1344   AST 36 08/16/2016 1344   ALT 41 08/16/2016 1344   ALKPHOS 115 08/16/2016 1344   BILITOT 1.4 (H) 08/16/2016 1344   GFRNONAA 50 (L) 08/20/2016 0542   GFRAA 58 (L) 08/20/2016 0542     Medications: I  have reviewed the patient's current medications.   Assessment/Plan: 1. Lymphocytosis ? Review of the peripheral blood smear is consistent with chronic lymphocytic leukemia ? 12/16/2014 Peripheral blood flow cytometry consistent with CLL 2.thrombocytopenia-likely secondary to chronic lymphocytic leukemia versus "pseudo thrombocytopenia"due to platelet clumping; progressive thrombocytopenia June 2018 3.Hospitalization with sepsis/UTI with bacteremia (Enterobacter aerogenes)March 2018   Disposition: [name] remains asymptomatic from the CLL.  The platelet count is stable.  He will contact us for new symptoms or bruising/bleeding.  He will return for a lab visit in 2 months and an office visit in 4 months.  The plan is to continue observation.  15 minutes were spent with the patient today.  The majority of the time was used for counseling and coordination of care.  [name] Coder, MD  10/24/2017  11:51 AM

## 2017-10-24 NOTE — Telephone Encounter (Signed)
Scheduled appt 5/7 los - Gave patient AVS and calender per los.

## 2017-12-11 ENCOUNTER — Other Ambulatory Visit: Payer: Self-pay | Admitting: *Deleted

## 2017-12-11 DIAGNOSIS — C9110 Chronic lymphocytic leukemia of B-cell type not having achieved remission: Secondary | ICD-10-CM

## 2017-12-12 ENCOUNTER — Telehealth: Payer: Self-pay | Admitting: Oncology

## 2017-12-12 NOTE — Telephone Encounter (Signed)
Called pt re appts that were added per 6/24 sch msg - left vm for pt re appts.

## 2017-12-13 ENCOUNTER — Inpatient Hospital Stay: Payer: Medicare Other

## 2017-12-13 ENCOUNTER — Telehealth: Payer: Self-pay | Admitting: Oncology

## 2017-12-13 NOTE — Telephone Encounter (Signed)
Patient called to reschedule  °

## 2017-12-14 ENCOUNTER — Inpatient Hospital Stay: Payer: Medicare Other

## 2017-12-15 ENCOUNTER — Telehealth: Payer: Self-pay | Admitting: Oncology

## 2017-12-15 ENCOUNTER — Inpatient Hospital Stay: Payer: Medicare Other

## 2017-12-15 NOTE — Telephone Encounter (Signed)
Patient called to reschedule  °

## 2017-12-18 ENCOUNTER — Inpatient Hospital Stay: Payer: Medicare Other | Attending: Oncology

## 2017-12-18 DIAGNOSIS — C9110 Chronic lymphocytic leukemia of B-cell type not having achieved remission: Secondary | ICD-10-CM | POA: Diagnosis not present

## 2017-12-18 LAB — CBC WITH DIFFERENTIAL (CANCER CENTER ONLY)
Basophils Absolute: 0 10*3/uL (ref 0.0–0.1)
Basophils Relative: 0 %
Eosinophils Absolute: 0.3 10*3/uL (ref 0.0–0.5)
Eosinophils Relative: 1 %
HCT: 43.8 % (ref 38.4–49.9)
Hemoglobin: 14.1 g/dL (ref 13.0–17.1)
Lymphocytes Relative: 76 %
Lymphs Abs: 21.2 10*3/uL — ABNORMAL HIGH (ref 0.9–3.3)
MCH: 29.7 pg (ref 27.2–33.4)
MCHC: 32.1 g/dL (ref 32.0–36.0)
MCV: 92.5 fL (ref 79.3–98.0)
Monocytes Absolute: 0.6 10*3/uL (ref 0.1–0.9)
Monocytes Relative: 2 %
Neutro Abs: 6 10*3/uL (ref 1.5–6.5)
Neutrophils Relative %: 21 %
Platelet Count: 31 10*3/uL — ABNORMAL LOW (ref 140–400)
RBC: 4.73 MIL/uL (ref 4.20–5.82)
RDW: 13.4 % (ref 11.0–14.6)
WBC Count: 28.1 10*3/uL — ABNORMAL HIGH (ref 4.0–10.3)

## 2017-12-22 NOTE — Progress Notes (Signed)
Lm for rtn call 

## 2017-12-28 ENCOUNTER — Other Ambulatory Visit: Payer: Medicare Other

## 2018-03-01 ENCOUNTER — Inpatient Hospital Stay: Payer: Medicare Other | Attending: Oncology | Admitting: Oncology

## 2018-03-01 ENCOUNTER — Encounter: Payer: Self-pay | Admitting: Oncology

## 2018-03-01 ENCOUNTER — Telehealth: Payer: Self-pay | Admitting: Oncology

## 2018-03-01 ENCOUNTER — Inpatient Hospital Stay: Payer: Medicare Other

## 2018-03-01 VITALS — BP 145/76 | HR 50 | Temp 97.4°F | Resp 18 | Ht 63.5 in | Wt 162.4 lb

## 2018-03-01 DIAGNOSIS — C9110 Chronic lymphocytic leukemia of B-cell type not having achieved remission: Secondary | ICD-10-CM

## 2018-03-01 DIAGNOSIS — D696 Thrombocytopenia, unspecified: Secondary | ICD-10-CM | POA: Diagnosis not present

## 2018-03-01 LAB — CBC WITH DIFFERENTIAL (CANCER CENTER ONLY)
Basophils Absolute: 0.1 10*3/uL (ref 0.0–0.1)
Basophils Relative: 0 %
Eosinophils Absolute: 0.4 10*3/uL (ref 0.0–0.5)
Eosinophils Relative: 1 %
HCT: 43.6 % (ref 38.4–49.9)
Hemoglobin: 14.1 g/dL (ref 13.0–17.1)
Lymphocytes Relative: 78 %
Lymphs Abs: 27.5 10*3/uL — ABNORMAL HIGH (ref 0.9–3.3)
MCH: 29.7 pg (ref 27.2–33.4)
MCHC: 32.2 g/dL (ref 32.0–36.0)
MCV: 92.3 fL (ref 79.3–98.0)
Monocytes Absolute: 0.7 10*3/uL (ref 0.1–0.9)
Monocytes Relative: 2 %
Neutro Abs: 6.6 10*3/uL — ABNORMAL HIGH (ref 1.5–6.5)
Neutrophils Relative %: 19 %
Platelet Count: 30 10*3/uL — ABNORMAL LOW (ref 140–400)
RBC: 4.73 MIL/uL (ref 4.20–5.82)
RDW: 13.4 % (ref 11.0–14.6)
WBC Count: 35.4 10*3/uL — ABNORMAL HIGH (ref 4.0–10.3)

## 2018-03-01 NOTE — Telephone Encounter (Signed)
Scheduled appt per 9/12 los. Gave patient AVS and calender per los,.

## 2018-03-01 NOTE — Progress Notes (Signed)
  [name] OFFICE PROGRESS NOTE   Diagnosis: CLL  INTERVAL HISTORY:   [name] returns as scheduled.  He feels well.  No fever or night sweats.  Good appetite.  He relates weight loss to exercise.  No change in the palpable lymph nodes.  He has a bruise at the dorsum of the right hand.  No no other bleeding.  Objective:  Vital signs in last 24 hours:  Blood pressure (!) 145/76, pulse (!) 50, temperature (!) 97.4 F (36.3 C), temperature source Oral, resp. rate 18, height 5' 3.5" (1.613 m), weight 162 lb 6.4 oz (73.7 kg), SpO2 100 %.    HEENT: Neck without mass Lymphatics: Soft mobile 1 cm bilateral submandibular and supraclavicular nodes.  1-1 0.5 centered bilateral axillary and 1 cm lateral bilateral inguinal nodes Resp: Lungs clear bilaterally Cardio: Regular rate and rhythm GI: No hepatosplenomegaly Vascular: No leg edema Skin: Resolving ecchymosis at the dorsum of the right hand.  No other ecchymoses.   Lab Results:  Lab Results  Component Value Date   WBC 35.4 (H) 03/01/2018   HGB 14.1 03/01/2018   HCT 43.6 03/01/2018   MCV 92.3 03/01/2018   PLT 30 (L) 03/01/2018   NEUTROABS 6.6 (H) 03/01/2018  Absolute lymphocyte count 27.5 Medications: I have reviewed the patient's current medications.   Assessment/Plan: 1. CLL ? Review of the peripheral blood smear is consistent with chronic lymphocytic leukemia ? 12/16/2014 Peripheral blood flow cytometry consistent with CLL 2.thrombocytopenia-likely secondary to chronic lymphocytic leukemia versus "pseudo thrombocytopenia"due to platelet clumping; progressive thrombocytopenia June 2018 3.Hospitalization with sepsis/UTI with bacteremia (Enterobacter aerogenes)March 2018   Disposition: [name] has chronic lymphocytic leukemia.  He has stable peripheral lymphadenopathy.  He has persistent thrombocytopenia.  We will initiate treatment for the CLL if the platelet count is lower.  He will contact us  for bleeding or bruising.  [name] will return for a CBC in 2 months at an office visit in 4 months.  He will obtain an influenza vaccine.  He plans to take the new zoster vaccine.  Betsy Coder, MD  03/01/2018  10:29 AM

## 2018-05-01 ENCOUNTER — Telehealth: Payer: Self-pay | Admitting: Oncology

## 2018-05-01 NOTE — Telephone Encounter (Signed)
Called regarding 11/14 per patient

## 2018-05-02 ENCOUNTER — Inpatient Hospital Stay: Payer: Medicare Other

## 2018-05-03 ENCOUNTER — Telehealth: Payer: Self-pay | Admitting: Emergency Medicine

## 2018-05-03 ENCOUNTER — Inpatient Hospital Stay: Payer: Medicare Other | Attending: Oncology

## 2018-05-03 DIAGNOSIS — C9110 Chronic lymphocytic leukemia of B-cell type not having achieved remission: Secondary | ICD-10-CM | POA: Diagnosis present

## 2018-05-03 LAB — CBC WITH DIFFERENTIAL (CANCER CENTER ONLY)
Abs Immature Granulocytes: 0.14 10*3/uL — ABNORMAL HIGH (ref 0.00–0.07)
Basophils Absolute: 0.1 10*3/uL (ref 0.0–0.1)
Basophils Relative: 0 %
Eosinophils Absolute: 0.4 10*3/uL (ref 0.0–0.5)
Eosinophils Relative: 1 %
HCT: 44.8 % (ref 39.0–52.0)
Hemoglobin: 13.9 g/dL (ref 13.0–17.0)
Immature Granulocytes: 0 %
Lymphocytes Relative: 73 %
Lymphs Abs: 33.5 10*3/uL — ABNORMAL HIGH (ref 0.7–4.0)
MCH: 30 pg (ref 26.0–34.0)
MCHC: 31 g/dL (ref 30.0–36.0)
MCV: 96.8 fL (ref 80.0–100.0)
Monocytes Absolute: 3.1 10*3/uL — ABNORMAL HIGH (ref 0.1–1.0)
Monocytes Relative: 7 %
Neutro Abs: 8.5 10*3/uL — ABNORMAL HIGH (ref 1.7–7.7)
Neutrophils Relative %: 19 %
Platelet Count: 30 10*3/uL — ABNORMAL LOW (ref 150–400)
RBC: 4.63 MIL/uL (ref 4.22–5.81)
RDW: 12.9 % (ref 11.5–15.5)
WBC Count: 45.7 10*3/uL — ABNORMAL HIGH (ref 4.0–10.5)
nRBC: 0 % (ref 0.0–0.2)

## 2018-05-03 NOTE — Telephone Encounter (Addendum)
VM left regarding this note.   ----- Message from Ladell Pier, MD sent at 05/03/2018  1:54 PM EST ----- Please call patient, platelet count is stable, follow-up as scheduled, call for bleeding

## 2018-07-05 ENCOUNTER — Inpatient Hospital Stay: Payer: Medicare Other

## 2018-07-05 ENCOUNTER — Telehealth: Payer: Self-pay

## 2018-07-05 ENCOUNTER — Inpatient Hospital Stay: Payer: Medicare Other | Attending: Oncology | Admitting: Oncology

## 2018-07-05 VITALS — BP 129/64 | HR 51 | Temp 97.9°F | Resp 20 | Ht 63.5 in | Wt 163.9 lb

## 2018-07-05 DIAGNOSIS — C9110 Chronic lymphocytic leukemia of B-cell type not having achieved remission: Secondary | ICD-10-CM

## 2018-07-05 DIAGNOSIS — D696 Thrombocytopenia, unspecified: Secondary | ICD-10-CM | POA: Diagnosis not present

## 2018-07-05 LAB — CBC WITH DIFFERENTIAL (CANCER CENTER ONLY)
Abs Immature Granulocytes: 0.14 10*3/uL — ABNORMAL HIGH (ref 0.00–0.07)
Basophils Absolute: 0.1 10*3/uL (ref 0.0–0.1)
Basophils Relative: 0 %
Eosinophils Absolute: 0.4 10*3/uL (ref 0.0–0.5)
Eosinophils Relative: 1 %
HCT: 43.1 % (ref 39.0–52.0)
Hemoglobin: 13.6 g/dL (ref 13.0–17.0)
Immature Granulocytes: 0 %
Lymphocytes Relative: 79 %
Lymphs Abs: 38.1 10*3/uL — ABNORMAL HIGH (ref 0.7–4.0)
MCH: 29.9 pg (ref 26.0–34.0)
MCHC: 31.6 g/dL (ref 30.0–36.0)
MCV: 94.7 fL (ref 80.0–100.0)
Monocytes Absolute: 3.1 10*3/uL — ABNORMAL HIGH (ref 0.1–1.0)
Monocytes Relative: 6 %
Neutro Abs: 6.6 10*3/uL (ref 1.7–7.7)
Neutrophils Relative %: 14 %
Platelet Count: 33 10*3/uL — ABNORMAL LOW (ref 150–400)
RBC: 4.55 MIL/uL (ref 4.22–5.81)
RDW: 13.2 % (ref 11.5–15.5)
WBC Count: 48.4 10*3/uL — ABNORMAL HIGH (ref 4.0–10.5)
nRBC: 0 % (ref 0.0–0.2)

## 2018-07-05 NOTE — Telephone Encounter (Signed)
Printed avs and calender of upcoming appointment. Per 1/16 los 

## 2018-07-05 NOTE — Progress Notes (Signed)
  [place] OFFICE PROGRESS NOTE   Diagnosis: CLL, thrombocytopenia  INTERVAL HISTORY:   Mr. Grand returns as scheduled.  He feels well.  He is exercising regularly.  He bruises easily.  No other bleeding.  No fever or night sweats.  Good appetite.  No severe infection.  Objective:  Vital signs in last 24 hours:  Blood pressure 129/64, pulse (!) 51, temperature 97.9 F (36.6 C), temperature source Oral, resp. rate 20, height 5' 3.5" (1.613 m), weight 163 lb 14.4 oz (74.3 kg), SpO2 98 %.    HEENT: Oral cavity without bleeding. Lymphatics: 1 cm right anterior cervical node, less than 1 cm low cervical/supraclavicular nodes, bilateral 2-3 cm axillary nodes on the left greater than right Resp: Lungs clear bilaterally Cardio: Regular rate and rhythm GI: No hepatosplenomegaly Vascular: No leg edema  Skin: No petechiae or ecchymoses    Lab Results:  Lab Results  Component Value Date   WBC 48.4 (H) 07/05/2018   HGB 13.6 07/05/2018   HCT 43.1 07/05/2018   MCV 94.7 07/05/2018   PLT 33 (L) 07/05/2018   NEUTROABS 6.6 07/05/2018     Medications: I have reviewed the patient's current medications.   Assessment/Plan: 1. CLL ? Review of the peripheral blood smear is consistent with chronic lymphocytic leukemia ? 12/16/2014 Peripheral blood flow cytometry consistent with CLL 2.thrombocytopenia-likely secondary to chronic lymphocytic leukemia versus "pseudo thrombocytopenia"due to platelet clumping; progressive thrombocytopenia June 2018 3.Hospitalization with sepsis/UTI with bacteremia (Enterobacter aerogenes)March 2018    Disposition: [name] appears well.  The peripheral lymph nodes are larger today (axillary) and the lymphocyte count is slowly increasing.  The platelet count is stable.  He would like to continue observation.  We discussed treatment with ibrutinib and acalabrutinib.  He is planning to have 2 teeth extracted.  He will have his oral  surgeon contact me to discuss the thrombocytopenia.  He will call for spontaneous bleeding or bruising.  [name] will return for a CBC in 2 months and an office visit in 4 months.  Betsy Coder, MD  07/05/2018  10:09 AM

## 2018-07-12 ENCOUNTER — Telehealth: Payer: Self-pay | Admitting: *Deleted

## 2018-07-12 NOTE — Telephone Encounter (Signed)
Having two dental extractions and wants to speak with [name] regarding bleeding risk with CLL and dental extractions. He also has several questions that only [name] can answer and is requesting a return call. Message forwarded to MD

## 2018-08-13 ENCOUNTER — Telehealth: Payer: Self-pay | Admitting: *Deleted

## 2018-08-13 NOTE — Telephone Encounter (Signed)
Going to country of [place] from 3/4 to 3/19 and is asking if there are any precautions he should take in regards to his CLL? Also asking if it is OK and if [name] thinks he should have a Hepatitis A vaccine?

## 2018-08-14 ENCOUNTER — Telehealth: Payer: Self-pay | Admitting: *Deleted

## 2018-08-14 ENCOUNTER — Telehealth: Payer: Self-pay | Admitting: Oncology

## 2018-08-14 NOTE — Telephone Encounter (Signed)
Called patient twice per scheduling VM log to reschedule his appt on 3/15.  Patient did not answer so the appt was not rescheduled.

## 2018-08-14 NOTE — Telephone Encounter (Signed)
Informed patient that precaution would be to be careful due to higher bleeding risk from low platelet count. He should discuss the Hepatitis A vaccine with Dr. Inda Merlin. Moved his 3/12 lab to 3/20 at his request.

## 2018-08-30 ENCOUNTER — Other Ambulatory Visit: Payer: Medicare Other

## 2018-09-07 ENCOUNTER — Other Ambulatory Visit: Payer: Medicare Other

## 2018-09-26 ENCOUNTER — Inpatient Hospital Stay: Payer: Medicare Other | Attending: Oncology

## 2018-10-15 ENCOUNTER — Encounter (HOSPITAL_COMMUNITY): Payer: Self-pay

## 2018-10-15 ENCOUNTER — Inpatient Hospital Stay (HOSPITAL_COMMUNITY)
Admission: EM | Admit: 2018-10-15 | Discharge: 2018-10-19 | DRG: 260 | Disposition: A | Discharge status: Home / Self Care | Payer: Medicare Other | Attending: Internal Medicine | Admitting: Internal Medicine

## 2018-10-15 ENCOUNTER — Other Ambulatory Visit: Payer: Self-pay

## 2018-10-15 ENCOUNTER — Emergency Department (HOSPITAL_COMMUNITY): Payer: Medicare Other

## 2018-10-15 DIAGNOSIS — I609 Nontraumatic subarachnoid hemorrhage, unspecified: Secondary | ICD-10-CM

## 2018-10-15 DIAGNOSIS — S022XXB Fracture of nasal bones, initial encounter for open fracture: Secondary | ICD-10-CM | POA: Diagnosis present

## 2018-10-15 DIAGNOSIS — R079 Chest pain, unspecified: Secondary | ICD-10-CM | POA: Diagnosis not present

## 2018-10-15 DIAGNOSIS — R55 Syncope and collapse: Secondary | ICD-10-CM

## 2018-10-15 DIAGNOSIS — I129 Hypertensive chronic kidney disease with stage 1 through stage 4 chronic kidney disease, or unspecified chronic kidney disease: Secondary | ICD-10-CM | POA: Diagnosis present

## 2018-10-15 DIAGNOSIS — S022XXA Fracture of nasal bones, initial encounter for closed fracture: Secondary | ICD-10-CM | POA: Diagnosis present

## 2018-10-15 DIAGNOSIS — W19XXXA Unspecified fall, initial encounter: Secondary | ICD-10-CM | POA: Insufficient documentation

## 2018-10-15 DIAGNOSIS — Z23 Encounter for immunization: Secondary | ICD-10-CM

## 2018-10-15 DIAGNOSIS — D696 Thrombocytopenia, unspecified: Secondary | ICD-10-CM

## 2018-10-15 DIAGNOSIS — R7989 Other specified abnormal findings of blood chemistry: Secondary | ICD-10-CM

## 2018-10-15 DIAGNOSIS — N179 Acute kidney failure, unspecified: Secondary | ICD-10-CM | POA: Diagnosis not present

## 2018-10-15 DIAGNOSIS — N183 Chronic kidney disease, stage 3 unspecified: Secondary | ICD-10-CM

## 2018-10-15 DIAGNOSIS — I10 Essential (primary) hypertension: Secondary | ICD-10-CM | POA: Diagnosis present

## 2018-10-15 DIAGNOSIS — I25110 Atherosclerotic heart disease of native coronary artery with unstable angina pectoris: Secondary | ICD-10-CM | POA: Diagnosis not present

## 2018-10-15 DIAGNOSIS — R0789 Other chest pain: Secondary | ICD-10-CM | POA: Diagnosis present

## 2018-10-15 DIAGNOSIS — M47812 Spondylosis without myelopathy or radiculopathy, cervical region: Secondary | ICD-10-CM | POA: Diagnosis present

## 2018-10-15 DIAGNOSIS — D693 Immune thrombocytopenic purpura: Secondary | ICD-10-CM | POA: Diagnosis present

## 2018-10-15 DIAGNOSIS — S066X1A Traumatic subarachnoid hemorrhage with loss of consciousness of 30 minutes or less, initial encounter: Secondary | ICD-10-CM | POA: Diagnosis present

## 2018-10-15 DIAGNOSIS — D649 Anemia, unspecified: Secondary | ICD-10-CM | POA: Diagnosis present

## 2018-10-15 DIAGNOSIS — R911 Solitary pulmonary nodule: Secondary | ICD-10-CM | POA: Diagnosis present

## 2018-10-15 DIAGNOSIS — C9110 Chronic lymphocytic leukemia of B-cell type not having achieved remission: Secondary | ICD-10-CM | POA: Diagnosis present

## 2018-10-15 DIAGNOSIS — I2582 Chronic total occlusion of coronary artery: Secondary | ICD-10-CM | POA: Diagnosis present

## 2018-10-15 DIAGNOSIS — E785 Hyperlipidemia, unspecified: Secondary | ICD-10-CM

## 2018-10-15 DIAGNOSIS — S0292XB Unspecified fracture of facial bones, initial encounter for open fracture: Secondary | ICD-10-CM

## 2018-10-15 DIAGNOSIS — R778 Other specified abnormalities of plasma proteins: Secondary | ICD-10-CM | POA: Diagnosis present

## 2018-10-15 DIAGNOSIS — Z79899 Other long term (current) drug therapy: Secondary | ICD-10-CM

## 2018-10-15 DIAGNOSIS — M4802 Spinal stenosis, cervical region: Secondary | ICD-10-CM | POA: Diagnosis present

## 2018-10-15 DIAGNOSIS — Y9301 Activity, walking, marching and hiking: Secondary | ICD-10-CM | POA: Diagnosis present

## 2018-10-15 DIAGNOSIS — E782 Mixed hyperlipidemia: Secondary | ICD-10-CM | POA: Diagnosis present

## 2018-10-15 DIAGNOSIS — D320 Benign neoplasm of cerebral meninges: Secondary | ICD-10-CM | POA: Diagnosis present

## 2018-10-15 DIAGNOSIS — W1839XA Other fall on same level, initial encounter: Secondary | ICD-10-CM | POA: Diagnosis present

## 2018-10-15 DIAGNOSIS — Y92008 Other place in unspecified non-institutional (private) residence as the place of occurrence of the external cause: Secondary | ICD-10-CM

## 2018-10-15 HISTORY — DX: Acute kidney failure, unspecified: N17.9

## 2018-10-15 HISTORY — DX: Nontraumatic subarachnoid hemorrhage, unspecified: I60.9

## 2018-10-15 HISTORY — DX: Other chest pain: R07.89

## 2018-10-15 HISTORY — DX: Syncope and collapse: R55

## 2018-10-15 HISTORY — DX: Unspecified fall, initial encounter: W19.XXXA

## 2018-10-15 HISTORY — DX: Fracture of nasal bones, initial encounter for closed fracture: S02.2XXA

## 2018-10-15 HISTORY — DX: Chronic kidney disease, stage 3 unspecified: N18.30

## 2018-10-15 HISTORY — DX: Thrombocytopenia, unspecified: D69.6

## 2018-10-15 LAB — CBC WITH DIFFERENTIAL/PLATELET
Abs Immature Granulocytes: 0 10*3/uL (ref 0.00–0.07)
Basophils Absolute: 0.5 10*3/uL — ABNORMAL HIGH (ref 0.0–0.1)
Basophils Relative: 1 %
Eosinophils Absolute: 0.5 10*3/uL (ref 0.0–0.5)
Eosinophils Relative: 1 %
HCT: 39.1 % (ref 39.0–52.0)
Hemoglobin: 12.5 g/dL — ABNORMAL LOW (ref 13.0–17.0)
Lymphocytes Relative: 60 %
Lymphs Abs: 28.4 10*3/uL — ABNORMAL HIGH (ref 0.7–4.0)
MCH: 29.8 pg (ref 26.0–34.0)
MCHC: 32 g/dL (ref 30.0–36.0)
MCV: 93.3 fL (ref 80.0–100.0)
Monocytes Absolute: 1.4 10*3/uL — ABNORMAL HIGH (ref 0.1–1.0)
Monocytes Relative: 3 %
Neutro Abs: 16.6 10*3/uL — ABNORMAL HIGH (ref 1.7–7.7)
Neutrophils Relative %: 35 %
Platelets: 36 10*3/uL — ABNORMAL LOW (ref 150–400)
RBC: 4.19 MIL/uL — ABNORMAL LOW (ref 4.22–5.81)
RDW: 13.4 % (ref 11.5–15.5)
WBC: 47.3 10*3/uL — ABNORMAL HIGH (ref 4.0–10.5)
nRBC: 0 % (ref 0.0–0.2)

## 2018-10-15 LAB — URINALYSIS, ROUTINE W REFLEX MICROSCOPIC
Bacteria, UA: NONE SEEN
Bilirubin Urine: NEGATIVE
Glucose, UA: NEGATIVE mg/dL
Ketones, ur: 5 mg/dL — AB
Leukocytes,Ua: NEGATIVE
Nitrite: NEGATIVE
Protein, ur: NEGATIVE mg/dL
Specific Gravity, Urine: 1.016 (ref 1.005–1.030)
pH: 6 (ref 5.0–8.0)

## 2018-10-15 LAB — BASIC METABOLIC PANEL
Anion gap: 14 (ref 5–15)
BUN: 28 mg/dL — ABNORMAL HIGH (ref 8–23)
CO2: 21 mmol/L — ABNORMAL LOW (ref 22–32)
Calcium: 9.2 mg/dL (ref 8.9–10.3)
Chloride: 104 mmol/L (ref 98–111)
Creatinine, Ser: 1.84 mg/dL — ABNORMAL HIGH (ref 0.61–1.24)
GFR calc Af Amer: 40 mL/min — ABNORMAL LOW (ref >60)
GFR calc non Af Amer: 35 mL/min — ABNORMAL LOW (ref >60)
Glucose, Bld: 101 mg/dL — ABNORMAL HIGH (ref 70–99)
Potassium: 3.5 mmol/L (ref 3.5–5.1)
Sodium: 139 mmol/L (ref 135–145)

## 2018-10-15 LAB — TROPONIN I: Troponin I: <0.03 ng/mL (ref <0.03)

## 2018-10-15 LAB — I-STAT CREATININE, ED: Creatinine, Ser: 1.7 mg/dL — ABNORMAL HIGH (ref 0.61–1.24)

## 2018-10-15 MED ORDER — TAMSULOSIN HCL 0.4 MG PO CAPS
0.4000 mg | ORAL_CAPSULE | Freq: Every day | ORAL | Status: DC
  Administered 2018-10-16 – 2018-10-19 (×4): 0.4 mg via ORAL
  Filled 2018-10-15 (×4): qty 1

## 2018-10-15 MED ORDER — HYDROMORPHONE HCL 1 MG/ML IJ SOLN
0.2500 mg | Freq: Once | INTRAMUSCULAR | Status: AC
  Administered 2018-10-15: 0.25 mg via INTRAVENOUS
  Filled 2018-10-15: qty 1

## 2018-10-15 MED ORDER — TETANUS-DIPHTH-ACELL PERTUSSIS 5-2.5-18.5 LF-MCG/0.5 IM SUSP
0.5000 mL | Freq: Once | INTRAMUSCULAR | Status: AC
  Administered 2018-10-15: 0.5 mL via INTRAMUSCULAR
  Filled 2018-10-15: qty 0.5

## 2018-10-15 MED ORDER — ONDANSETRON HCL 4 MG/2ML IJ SOLN
4.0000 mg | Freq: Four times a day (QID) | INTRAMUSCULAR | Status: DC | PRN
  Filled 2018-10-15: qty 2

## 2018-10-15 MED ORDER — ONDANSETRON HCL 4 MG PO TABS: 4.0000 mg | ORAL_TABLET | Freq: Four times a day (QID) | ORAL | Status: DC | PRN

## 2018-10-15 MED ORDER — VITAMIN D 25 MCG (1000 UNIT) PO TABS
2000.0000 [IU] | ORAL_TABLET | Freq: Every day | ORAL | Status: DC
  Administered 2018-10-16 – 2018-10-19 (×4): 2000 [IU] via ORAL
  Filled 2018-10-15 (×4): qty 2

## 2018-10-15 MED ORDER — BISOPROLOL FUMARATE 10 MG PO TABS
10.0000 mg | ORAL_TABLET | Freq: Every day | ORAL | Status: DC
  Administered 2018-10-16 – 2018-10-17 (×2): 10 mg via ORAL
  Filled 2018-10-15 (×2): qty 1

## 2018-10-15 MED ORDER — LIDOCAINE-EPINEPHRINE (PF) 2 %-1:200000 IJ SOLN
10.0000 mL | Freq: Once | INTRAMUSCULAR | Status: DC
  Filled 2018-10-15: qty 20

## 2018-10-15 MED ORDER — ACETAMINOPHEN 325 MG PO TABS: 650.0000 mg | ORAL_TABLET | Freq: Four times a day (QID) | ORAL | Status: DC | PRN

## 2018-10-15 MED ORDER — SODIUM CHLORIDE 0.9 % IV SOLN
1.5000 g | Freq: Once | INTRAVENOUS | Status: AC
  Administered 2018-10-15: 1.5 g via INTRAVENOUS
  Filled 2018-10-15: qty 1.5

## 2018-10-15 MED ORDER — SODIUM CHLORIDE 0.9% FLUSH
3.0000 mL | Freq: Two times a day (BID) | INTRAVENOUS | Status: DC
  Administered 2018-10-16 – 2018-10-18 (×5): 3 mL via INTRAVENOUS

## 2018-10-15 MED ORDER — SENNOSIDES-DOCUSATE SODIUM 8.6-50 MG PO TABS: 1.0000 | ORAL_TABLET | Freq: Every evening | ORAL | Status: DC | PRN

## 2018-10-15 MED ORDER — SODIUM CHLORIDE 0.9 % IV SOLN
1.5000 g | Freq: Four times a day (QID) | INTRAVENOUS | Status: DC
  Administered 2018-10-16 – 2018-10-18 (×10): 1.5 g via INTRAVENOUS
  Filled 2018-10-15 (×14): qty 1.5

## 2018-10-15 MED ORDER — HYDRALAZINE HCL 20 MG/ML IJ SOLN: 5.0000 mg | INTRAMUSCULAR | Status: DC | PRN

## 2018-10-15 MED ORDER — ACETAMINOPHEN 650 MG RE SUPP: 650.0000 mg | Freq: Four times a day (QID) | RECTAL | Status: DC | PRN

## 2018-10-15 MED ORDER — IOHEXOL 350 MG/ML SOLN
75.0000 mL | Freq: Once | INTRAVENOUS | Status: AC | PRN
  Administered 2018-10-15: 75 mL via INTRAVENOUS

## 2018-10-15 MED ORDER — SIMVASTATIN 20 MG PO TABS
20.0000 mg | ORAL_TABLET | Freq: Every day | ORAL | Status: DC
  Administered 2018-10-16 – 2018-10-18 (×3): 20 mg via ORAL
  Filled 2018-10-15 (×3): qty 1

## 2018-10-15 MED ORDER — OXYCODONE-ACETAMINOPHEN 5-325 MG PO TABS
1.0000 | ORAL_TABLET | ORAL | Status: DC | PRN
  Administered 2018-10-16: 1 via ORAL
  Filled 2018-10-15: qty 1

## 2018-10-15 MED ORDER — SODIUM CHLORIDE 0.9 % IV SOLN
INTRAVENOUS | Status: DC
  Administered 2018-10-15: 1000 mL via INTRAVENOUS

## 2018-10-15 MED ORDER — VITAMIN D 25 MCG (1000 UNIT) PO TABS: 2000.0000 [IU] | ORAL_TABLET | Freq: Every day | ORAL | Status: DC

## 2018-10-15 MED ORDER — LORATADINE 10 MG PO TABS: 10.0000 mg | ORAL_TABLET | Freq: Every day | ORAL | Status: DC | PRN

## 2018-10-15 MED ORDER — MORPHINE SULFATE (PF) 2 MG/ML IV SOLN: 0.5000 mg | INTRAVENOUS | Status: DC | PRN

## 2018-10-15 NOTE — ED Notes (Signed)
Called patient's wife Deb at his request and updated her on current results and plan of care. Wife thanked this Probation officer and was able to speak to her husband over the phone.

## 2018-10-15 NOTE — ED Notes (Signed)
c-collar applied  

## 2018-10-15 NOTE — ED Notes (Signed)
Attempted report 

## 2018-10-15 NOTE — ED Triage Notes (Signed)
Pt from home via ems; out walking today, became lightheaded, began experiencing chest tightness, sob, and had syncopal episode; laceration to nose and forehead; no pain, pt not on thinners; EKG unremarkable with ems; pt c/o numbness/tingling in both hands  138/70 HR 68 RR 16 97% RA CBG 95 Temp 98.76F

## 2018-10-15 NOTE — ED Notes (Signed)
[name] (daughter) [phone] [name] (ex wife in case daughter's number is incorrect)[phone]

## 2018-10-15 NOTE — H&P (Signed)
History and Physical    [name] WCB:[id number] DOB: 04/14/1941 DOA: 10/15/2018  Referring MD/NP/PA:   PCP: [name], MD   Patient coming from:  The patient is coming from home.  At baseline, pt is independent for most of ADL.        Chief Complaint: syncope  HPI: [name] is a 78 y.o. male with medical history significant of CLL under observation, thrombocytopenia, hypertension, hyperlipidemia,GERD, who presents with syncope.  Pt states that he was taking a walk at about 6:40 PM when he began to experience intermittent chest tightness and SOB, then he passed out briefly and fell. He is not sure how long he has passed out, but believes that it is " briefly". He does not remember if he felt lightheaded.  He did not have unilateral weakness or numbness in extremities before the event, but he developed numbness in both hands. No slurred speech or facial droop. He notes blurred vision bilaterally but states that he is not wearing his glasses and so this is not unusual for him. He injured himself, caused skin laceration of nose and forehead.  Currently patient denies any chest pain or shortness of breath.  No cough, fever or chills.  He has mild nausea, but no vomiting, diarrhea or abdominal pain.  Denies symptoms of UTI. Pt states that he recently returned from [place] in mid March and has been quarantining at home since then. He states that he has been experiencing this chest tightness with exertin intermittently for a couple of weeks now.    ED Course: pt was found to have WBC 47.3, platelets 36, worsening renal function, negative troponin-->0.07, temperature normal, bradycardia, oxygen saturation 97% on room air. CTA of chest is negative for PE. Pt is placed on tele bed for obs. ENT, Dr. Dellie Burns and neurosurgeon, Dr. Arnoldo Morale   CTA of chest: 1. No evidence of acute pulmonary embolus. No acute traumatic injury identified in the chest. 2. History of CLL with evidence of active  disease: splenomegaly, cervical and mediastinal lymphadenopathy. 3. No acute pulmonary abnormality. A solitary lung nodule in the left lower lobe has minimally increased from 7 to 11 mm over the past 10 years compatible with benign etiology. 4. Mild cardiomegaly.  CT of head, C-spin and maxillofacial imaging: 1. Positive for trace posttraumatic subarachnoid hemorrhage along the medial aspect of the right superior frontal gyrus. No other acute traumatic injury to the brain identified. 2. Positive for generalized cervical Lymphadenopathy continuing to the thoracic inlet. On discussion with Dr. Tyrone Nine in the ED he advises the patient has chronic lymphocytic leukemia (CLL) which explains this appearance. 3. Comminuted and impacted bilateral nasal bone fractures. Probable associated fracture of the anterior superior nasal septum. Nondisplaced fracture of the anterior right frontoethmoidal confluence suspected, with mild regional soft tissue gas. Nondisplaced fracture of the right anterior lamina papyracea with trace gas in the anterior superior right orbit. No intraorbital hematoma. 4. Probable nutrient foramen of the right mandible parasymphysis, less likely a nondisplaced fracture (series 13, image 42). 5. No acute fracture in the cervical spine. 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   MRI HEAD IMPRESSION: 1. Small volume acute posttraumatic subarachnoid hemorrhage involving the medial right frontal lobe, stable from prior CT. No evidence for new or progressive hemorrhage. 2. No other acute intracranial abnormality. 3. Focal 1-2 cm osseous lesions involving the right parietal and occipital calvarium as above, indeterminate. Findings are of uncertain significance, with  no definite corresponding osseous lesion seen on prior CT. Correlation with dedicated bone scan suggested for further evaluation. 4. Soft tissue contusion involving the  forehead and central face with associated facial fractures, better seen on prior CT. 5. 13 mm meningioma overlying the left parietal convexity without associated edema.  MRI CERVICAL SPINE IMPRESSION: 1. No acute traumatic injury or other finding within the cervical spine status post recent syncope and fall. No evidence for spinal cord or ligamentous injury. 2. Moderate multilevel cervical spondylolysis with resultant moderate spinal stenosis at C4-5 and more severe narrowing at C5-6.  3. Multifactorial degenerative changes with resultant multilevel foraminal narrowing as above. Notable findings include moderate bilateral C4 foraminal stenosis, severe left with moderate right C5 foraminal narrowing, severe bilateral C6 foraminal stenosis, with moderate right C7 foraminal narrowing. 4. Prominent bulky cervical adenopathy within the visualized neck, compatible with history of CLL.  Review of Systems:   General: no fevers, chills, no body weight gain, has fatigue HEENT: no blurry loss, hearing changes or sore throat Respiratory: has dyspnea, no coughing, wheezing CV: has chest tightness, no palpitations GI: has nausea, no vomiting, abdominal pain, diarrhea, constipation GU: no dysuria, burning on urination, increased urinary frequency, hematuria  Ext: no leg edema Neuro: no unilateral weakness, no vision loss or hearing loss.  Has numbness in both hands. Skin: no rash, no skin tear. MSK: No muscle spasm, no deformity, no limitation of range of movement in spin Heme: No easy bruising.  Travel history: No recent long distant travel.  Allergy: No Known Allergies  Past Medical History:  Diagnosis Date   Arthritis    Carpal tunnel syndrome, bilateral 10/09/2017   GERD (gastroesophageal reflux disease)    Hepatitis 1966   Drug reaction after taking medication   Hyperlipemia    Hypertension    Lung nodule seen on imaging study    bilateral lungs    Past Surgical History:    Procedure Laterality Date   APPENDECTOMY     arthroscoyp  2000   left knee   COLONOSCOPY W/ POLYPECTOMY     LUMBAR LAMINECTOMY/DECOMPRESSION MICRODISCECTOMY  06/27/2012   Procedure: LUMBAR LAMINECTOMY/DECOMPRESSION MICRODISCECTOMY 1 LEVEL;  Surgeon: [name], MD;  Location: [facility];  Service: Neurosurgery;  Laterality: Bilateral;  bilateral four-five laminectony   SKIN CANCER EXCISION  5-6 years ago   moe-s surgery- basal b   TONSILLECTOMY      Social History:  reports that he has never smoked. He has never used smokeless tobacco. He reports current alcohol use of about 2.0 standard drinks of alcohol per week. He reports that he does not use drugs.  Family History:  Family History  Problem Relation Age of Onset   Sudden death Mother 71       possible botulism   Dementia Father    Seizures Sister      Prior to Admission medications   Medication Sig Start Date End Date Taking? Authorizing Provider  bisoprolol-hydrochlorothiazide (ZIAC) 10-6.25 MG per tablet Take 1 tablet by mouth daily.   Yes [provider]  Cholecalciferol (VITAMIN D) 2000 units CAPS Take 2,000 Units by mouth daily.    Yes [provider]  Loratadine 10 MG CAPS Take 10 mg by mouth.   Yes [provider]  simvastatin (ZOCOR) 40 MG tablet Take 20 mg by mouth daily.    Yes [provider]  Tamsulosin HCl (FLOMAX) 0.4 MG CAPS Take 1 capsule (0.4 mg total) by mouth daily. 06/30/12  Yes  Jovita Gamma, MD  EPINEPHrine (EPIPEN) 0.3 mg/0.3 mL DEVI Inject 0.3 mLs (0.3 mg total) into the muscle once. Patient not taking: Reported on 08/17/2017 05/26/12   Rise Mu, PA-C    Physical Exam: Vitals:   10/15/18 2145 10/15/18 2200 10/16/18 0025 10/16/18 0402  BP: (!) 148/82 133/60 (!) 154/74 (!) 116/50  Pulse: 60 (!) 56 (!) 57 (!) 58  Resp: 13 14 14 14   Temp:   98.3 F (36.8 C) 98.4 F (36.9 C)  TempSrc:   Oral Oral  SpO2: 100% 100% 100% 100%  Weight:   73.8 kg    Height:   5\' 3"  (1.6 m)    General: Not in acute distress HEENT:       Eyes: PERRL, EOMI, no scleral icterus.       ENT: No discharge from the ears and nose, no pharynx injection, no tonsillar enlargement.        Neck: No JVD, no bruit, no mass felt. Heme: No neck lymph node enlargement. Cardiac: S1/S2, RRR, No murmurs, No gallops or rubs. Respiratory: No rales, wheezing, rhonchi or rubs. GI: Soft, nondistended, nontender, no rebound pain, no organomegaly, BS present. GU: No hematuria Ext: No pitting leg edema bilaterally. 2+DP/PT pulse bilaterally. Musculoskeletal: No joint deformities, No joint redness or warmth, no limitation of ROM in spin. Skin: No rashes. Has skin laceration in nose and forehead. Beeding is overall controled. No raccoon eyes. Has tenderness to palpation surrounding his wounds, but no crepitus. Neuro: Alert, oriented X3, cranial nerves II-XII grossly intact, moves all extremities normally. Muscle strength 5/5 in all extremities, sensation to light touch intact. Brachial reflex 2+ bilaterally. Negative Babinski's sign.  Psych: Patient is not psychotic, no suicidal or hemocidal ideation.  Labs on Admission: I have personally reviewed following labs and imaging studies  CBC: Recent Labs  Lab 10/15/18 1806 10/16/18 0339  WBC 47.3* 39.7*  NEUTROABS 16.6*  --   HGB 12.5* 11.8*  HCT 39.1 36.1*  MCV 93.3 92.6  PLT 36* 29*   Basic Metabolic Panel: Recent Labs  Lab 10/15/18 1806 10/15/18 1815 10/16/18 0339  NA 139  --  138  K 3.5  --  3.8  CL 104  --  107  CO2 21*  --  20*  GLUCOSE 101*  --  94  BUN 28*  --  27*  CREATININE 1.84* 1.70* 1.70*  CALCIUM 9.2  --  8.6*   GFR: Estimated Creatinine Clearance: 32.8 mL/min (A) (by C-G formula based on SCr of 1.7 mg/dL (H)). Liver Function Tests: No results for input(s): AST, ALT, ALKPHOS, BILITOT, PROT, ALBUMIN in the last 168 hours. No results for input(s): LIPASE, AMYLASE in the last 168 hours. No results  for input(s): AMMONIA in the last 168 hours. Coagulation Profile: No results for input(s): INR, PROTIME in the last 168 hours. Cardiac Enzymes: Recent Labs  Lab 10/15/18 1806 10/15/18 2335 10/16/18 0339  TROPONINI <0.03 0.07* 0.10*   BNP (last 3 results) No results for input(s): PROBNP in the last 8760 hours. HbA1C: Recent Labs    10/16/18 0339  HGBA1C 5.4   CBG: No results for input(s): GLUCAP in the last 168 hours. Lipid Profile: Recent Labs    10/16/18 0339  CHOL 106  HDL 20*  LDLCALC 61  TRIG 125  CHOLHDL 5.3   Thyroid Function Tests: No results for input(s): TSH, T4TOTAL, FREET4, T3FREE, THYROIDAB in the last 72 hours. Anemia Panel: No results for input(s): VITAMINB12, FOLATE, FERRITIN, TIBC, IRON, RETICCTPCT  in the last 72 hours. Urine analysis:    Component Value Date/Time   COLORURINE YELLOW 10/15/2018 1946   APPEARANCEUR CLEAR 10/15/2018 1946   LABSPEC 1.016 10/15/2018 1946   PHURINE 6.0 10/15/2018 1946   GLUCOSEU NEGATIVE 10/15/2018 1946   HGBUR SMALL (A) 10/15/2018 1946   BILIRUBINUR NEGATIVE 10/15/2018 1946   KETONESUR 5 (A) 10/15/2018 1946   PROTEINUR NEGATIVE 10/15/2018 1946   NITRITE NEGATIVE 10/15/2018 1946   LEUKOCYTESUR NEGATIVE 10/15/2018 1946   Sepsis Labs: @LABRCNTIP (procalcitonin:4,lacticidven:4) )No results found for this or any previous visit (from the past 240 hour(s)).   Radiological Exams on Admission: Ct Head Wo Contrast  Result Date: 10/15/2018 CLINICAL DATA:  78 year old male status post syncope and blunt trauma. Nose and forehead lacerations. EXAM: CT HEAD WITHOUT CONTRAST CT MAXILLOFACIAL WITHOUT CONTRAST CT CERVICAL SPINE WITHOUT CONTRAST TECHNIQUE: Multidetector CT imaging of the head, cervical spine, and maxillofacial structures were performed using the standard protocol without intravenous contrast. Multiplanar CT image reconstructions of the cervical spine and maxillofacial structures were also generated. COMPARISON:   Paranasal sinus CT 04/06/2011. FINDINGS: CT HEAD FINDINGS Brain: Trace subarachnoid hemorrhage along the medial aspect of the right superior frontal gyrus on series 3, image 20 and series 5, image 21. No subdural blood or other extra-axial hemorrhage identified. No intraventricular hemorrhage. No parenchymal hemorrhagic contusion identified. Partially calcified left frontoparietal convexity 11-13 millimeter meningioma suspected (series 3, image 26 and coronal image 52). No significant mass effect. No cerebral edema. Gray-white matter differentiation is within normal limits for age. No ventriculomegaly or intracranial mass effect. No cortically based acute infarct identified. Vascular: Calcified atherosclerosis at the skull base. No suspicious intracranial vascular hyperdensity. Skull: Calvarium appears intact. See facial bone findings below. Other: Forehead scalp hematoma tracks cephalad. See facial bone findings below. Other scalp soft tissues appear negative. CT MAXILLOFACIAL FINDINGS Osseous: Comminuted and impacted bilateral anterior nasal bone fractures with associated nasal bridge soft tissue swelling and soft tissue gas. The maxillary nasal processes remain intact. However, there is mild associated deformity of the anterior superior nasal septum. There is also abnormal soft tissue gas in and around the right medial frontoethmoidal confluence, with a possible nondisplaced fracture there on series 8, image 64. Otherwise the anterior walls of the frontal sinuses appear intact. Nondisplaced fracture versus nutrient foramen of the right mandible in the parasymphyseal region best seen on series 13, image 42. Elsewhere the mandible appears intact and normally aligned, but there is superimposed poor dentition. No maxilla or zygoma fracture. Poor maxillary dentition posteriorly. Central skull base intact. Pterygoid plates intact. Orbits: Trace gas in the anterior superior right orbit suspicious for nondisplaced lamina  papyracea fracture there on series 8, image 70 and series 12, image 33. Other orbital walls are intact. Globes are intact. There is preseptal soft tissue swelling/hematoma, but no intraorbital hematoma. No other intraorbital gas. Sinuses: Trace hemorrhage layering in the right frontal sinus (series 6, image 68) associated with the nondisplaced frontoethmoidal fractures described above. Mild right anterior ethmoid bladder mucosal thickening. Minimal to mild paranasal sinus mucosal thickening elsewhere. Small volume retained secretions in the nasal cavity. Leftward anterior nasal septal deviation may be acute and associated with the nasal bone fractures described earlier. Negative nasal cavity otherwise. Tympanic cavities and mastoids are clear. Soft tissues: Negative visible noncontrast larynx, pharynx, parapharyngeal spaces, retropharyngeal space, sublingual space, submandibular and parotid glands. There is moderate bilateral level 1, level 2, and visible level 3 lymphadenopathy with enlarged individual nodes up to 17 millimeters short axis. CT  CERVICAL SPINE FINDINGS Alignment: Mild reversal of cervical lordosis. Cervicothoracic junction alignment is within normal limits. Bilateral posterior element alignment is within normal limits. Skull base and vertebrae: Visualized skull base is intact. No atlanto-occipital dissociation. No acute osseous abnormality identified. Soft tissues and spinal canal: No prevertebral fluid or swelling. No visible canal hematoma. Generalized bilateral cervical lymphadenopathy continuing to the thoracic inlet (series 15, image 64). Disc levels: Congenital incomplete segmentation versus acquired ankylosis of C6-C7. Advanced cervical disc and endplate degeneration elsewhere. At least mild multifactorial spinal stenosis at C4-C5 and C5-C6. Upper chest: Negative lung apices. Grossly intact visible upper thoracic levels. IMPRESSION: 1. Positive for trace posttraumatic subarachnoid hemorrhage  along the medial aspect of the right superior frontal gyrus. No other acute traumatic injury to the brain identified. 2. Positive for generalized cervical Lymphadenopathy continuing to the thoracic inlet. On discussion with Dr. Tyrone Nine in the ED he advises the patient has chronic lymphocytic leukemia (CLL) which explains this appearance. 3. Comminuted and impacted bilateral nasal bone fractures. Probable associated fracture of the anterior superior nasal septum. Nondisplaced fracture of the anterior right frontoethmoidal confluence suspected, with mild regional soft tissue gas. Nondisplaced fracture of the right anterior lamina papyracea with trace gas in the anterior superior right orbit. No intraorbital hematoma. 4. Probable nutrient foramen of the right mandible parasymphysis, less likely a nondisplaced fracture (series 13, image 42). 5. No acute fracture in the cervical spine. 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 Study discussed by telephone with Dr. Tyrone Nine on 10/15/2018 at 19:50 . Electronically Signed   By: Genevie Ann M.D.   On: 10/15/2018 19:56   Ct Angio Chest Pe W And/or Wo Contrast  Result Date: 10/15/2018 CLINICAL DATA:  78 year old male status post syncope and blunt trauma. Chest tightness, shortness of breath. CLL. EXAM: CT ANGIOGRAPHY CHEST WITH CONTRAST TECHNIQUE: Multidetector CT imaging of the chest was performed using the standard protocol during bolus administration of intravenous contrast. Multiplanar CT image reconstructions and MIPs were obtained to evaluate the vascular anatomy. CONTRAST:  26mL OMNIPAQUE IOHEXOL 350 MG/ML SOLN COMPARISON:  Head face and cervical spine CT today reported separately. Chest radiographs 08/17/2016. Chest CT 03/16/2009. FINDINGS: Cardiovascular: Good contrast bolus timing in the pulmonary arterial tree. Mild respiratory motion. No focal filling defect identified in the pulmonary arteries to suggest  acute pulmonary embolism. Negative visible aorta aside from mild atherosclerosis. Mild cardiomegaly. No pericardial effusion. Mediastinum/Nodes: Lower cervical lymphadenopathy continuing to the thoracic inlet as seen on the face and cervical spine imaging today. There is mild to moderate mediastinal and hilar lymphadenopathy, new since 2010. Individual nodes measure up to 12 millimeter short axis. Lungs/Pleura: Major airways are patent. No pneumothorax or pleural effusion. No pulmonary contusion. Mild chronic scarring in the lingula and medial segment of the right middle lobe. There is a round 10-11 millimeter anterior basal segment left lower lobe lung nodule which was 7 millimeters in 2010. This is most likely benign. Upper Abdomen: Partially visible splenomegaly, new since 2010. Negative visible liver and bowel in the upper abdomen. Musculoskeletal: No rib or sternal fracture identified. Thoracic spine degeneration. No acute osseous abnormality identified. Review of the MIP images confirms the above findings. IMPRESSION: 1. No evidence of acute pulmonary embolus. No acute traumatic injury identified in the chest. 2. History of CLL with evidence of active disease: splenomegaly, cervical and mediastinal lymphadenopathy. 3. No acute pulmonary abnormality. A solitary lung nodule in the left lower lobe has minimally increased from  7 to 11 mm over the past 10 years compatible with benign etiology. 4. Mild cardiomegaly. Electronically Signed   By: Genevie Ann M.D.   On: 10/15/2018 20:01   Ct Cervical Spine Wo Contrast  Result Date: 10/15/2018 CLINICAL DATA:  78 year old male status post syncope and blunt trauma. Nose and forehead lacerations. EXAM: CT HEAD WITHOUT CONTRAST CT MAXILLOFACIAL WITHOUT CONTRAST CT CERVICAL SPINE WITHOUT CONTRAST TECHNIQUE: Multidetector CT imaging of the head, cervical spine, and maxillofacial structures were performed using the standard protocol without intravenous contrast. Multiplanar CT  image reconstructions of the cervical spine and maxillofacial structures were also generated. COMPARISON:  Paranasal sinus CT 04/06/2011. FINDINGS: CT HEAD FINDINGS Brain: Trace subarachnoid hemorrhage along the medial aspect of the right superior frontal gyrus on series 3, image 20 and series 5, image 21. No subdural blood or other extra-axial hemorrhage identified. No intraventricular hemorrhage. No parenchymal hemorrhagic contusion identified. Partially calcified left frontoparietal convexity 11-13 millimeter meningioma suspected (series 3, image 26 and coronal image 52). No significant mass effect. No cerebral edema. Gray-white matter differentiation is within normal limits for age. No ventriculomegaly or intracranial mass effect. No cortically based acute infarct identified. Vascular: Calcified atherosclerosis at the skull base. No suspicious intracranial vascular hyperdensity. Skull: Calvarium appears intact. See facial bone findings below. Other: Forehead scalp hematoma tracks cephalad. See facial bone findings below. Other scalp soft tissues appear negative. CT MAXILLOFACIAL FINDINGS Osseous: Comminuted and impacted bilateral anterior nasal bone fractures with associated nasal bridge soft tissue swelling and soft tissue gas. The maxillary nasal processes remain intact. However, there is mild associated deformity of the anterior superior nasal septum. There is also abnormal soft tissue gas in and around the right medial frontoethmoidal confluence, with a possible nondisplaced fracture there on series 8, image 64. Otherwise the anterior walls of the frontal sinuses appear intact. Nondisplaced fracture versus nutrient foramen of the right mandible in the parasymphyseal region best seen on series 13, image 42. Elsewhere the mandible appears intact and normally aligned, but there is superimposed poor dentition. No maxilla or zygoma fracture. Poor maxillary dentition posteriorly. Central skull base intact.  Pterygoid plates intact. Orbits: Trace gas in the anterior superior right orbit suspicious for nondisplaced lamina papyracea fracture there on series 8, image 70 and series 12, image 33. Other orbital walls are intact. Globes are intact. There is preseptal soft tissue swelling/hematoma, but no intraorbital hematoma. No other intraorbital gas. Sinuses: Trace hemorrhage layering in the right frontal sinus (series 6, image 68) associated with the nondisplaced frontoethmoidal fractures described above. Mild right anterior ethmoid bladder mucosal thickening. Minimal to mild paranasal sinus mucosal thickening elsewhere. Small volume retained secretions in the nasal cavity. Leftward anterior nasal septal deviation may be acute and associated with the nasal bone fractures described earlier. Negative nasal cavity otherwise. Tympanic cavities and mastoids are clear. Soft tissues: Negative visible noncontrast larynx, pharynx, parapharyngeal spaces, retropharyngeal space, sublingual space, submandibular and parotid glands. There is moderate bilateral level 1, level 2, and visible level 3 lymphadenopathy with enlarged individual nodes up to 17 millimeters short axis. CT CERVICAL SPINE FINDINGS Alignment: Mild reversal of cervical lordosis. Cervicothoracic junction alignment is within normal limits. Bilateral posterior element alignment is within normal limits. Skull base and vertebrae: Visualized skull base is intact. No atlanto-occipital dissociation. No acute osseous abnormality identified. Soft tissues and spinal canal: No prevertebral fluid or swelling. No visible canal hematoma. Generalized bilateral cervical lymphadenopathy continuing to the thoracic inlet (series 15, image 64). Disc levels: Congenital incomplete segmentation  versus acquired ankylosis of C6-C7. Advanced cervical disc and endplate degeneration elsewhere. At least mild multifactorial spinal stenosis at C4-C5 and C5-C6. Upper chest: Negative lung apices.  Grossly intact visible upper thoracic levels. IMPRESSION: 1. Positive for trace posttraumatic subarachnoid hemorrhage along the medial aspect of the right superior frontal gyrus. No other acute traumatic injury to the brain identified. 2. Positive for generalized cervical Lymphadenopathy continuing to the thoracic inlet. On discussion with Dr. Tyrone Nine in the ED he advises the patient has chronic lymphocytic leukemia (CLL) which explains this appearance. 3. Comminuted and impacted bilateral nasal bone fractures. Probable associated fracture of the anterior superior nasal septum. Nondisplaced fracture of the anterior right frontoethmoidal confluence suspected, with mild regional soft tissue gas. Nondisplaced fracture of the right anterior lamina papyracea with trace gas in the anterior superior right orbit. No intraorbital hematoma. 4. Probable nutrient foramen of the right mandible parasymphysis, less likely a nondisplaced fracture (series 13, image 42). 5. No acute fracture in the cervical spine. 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 Study discussed by telephone with Dr. Tyrone Nine on 10/15/2018 at 19:50 . Electronically Signed   By: Genevie Ann M.D.   On: 10/15/2018 19:56   Mr Brain Wo Contrast (neuro Protocol)  Result Date: 10/16/2018 CLINICAL DATA:  Initial evaluation for acute syncope, bilateral hand tingling. EXAM: MRI HEAD WITHOUT CONTRAST MRI CERVICAL SPINE WITHOUT CONTRAST TECHNIQUE: Multiplanar, multiecho pulse sequences of the brain and surrounding structures, and cervical spine, to include the craniocervical junction and cervicothoracic junction, were obtained without intravenous contrast. COMPARISON:  Prior CT from 10/15/2018 FINDINGS: MRI HEAD FINDINGS Brain: Cerebral volume within normal limits. No significant cerebral white matter changes for age. No abnormal foci of restricted diffusion to suggest acute or subacute ischemia. Gray-white matter  differentiation maintained. No encephalomalacia to suggest chronic cortical infarction. Scattered small volume susceptibility artifact overlying the medial aspect of the right superior frontal gyrus compatible with small volume posttraumatic subarachnoid hemorrhage, stable from prior CT. No other new or progressive intracranial hemorrhage. Trace layering blood products noted within the occipital horns of both lateral ventricles, compatible with redistribution. 13 mm meningioma overlies the parasagittal left parietal convexity (series 11, image 20). No associated edema or mass effect. No other mass lesion. No midline shift or mass effect. No hydrocephalus. No extra-axial fluid collection. Pituitary gland suprasellar region normal. Midline structures intact and normal. Vascular: Major intracranial vascular flow voids are well maintained. Skull and upper cervical spine: Craniocervical junction normal. Bone marrow signal intensity within normal limits. Focal 17 mm T1 hypointense lesion present within the right parietal calvarium (series 9, image 6). Additional possible 2 cm lesion posteriorly within the left occipital calvarium (series 9, image 15). Findings are indeterminate. No corresponding osseous lesion seen on prior CT. Soft tissue swelling seen at the forehead, extending inferiorly to involve the nasal bridge and nose, also seen on prior maxillofacial CT. Associated maxillofacial fractures better evaluated on prior exam. Sinuses/Orbits: Globes and orbital soft tissues within normal limits. Probable right orbital fracture better seen on prior CT. Scattered mucosal thickening throughout the ethmoidal air cells and maxillary sinuses. No significant mastoid effusion. Inner ear structures normal. Other: None. MRI CERVICAL SPINE FINDINGS Alignment: Straightening with slight reversal of the normal cervical lordosis, apex at C5-6. Trace anterolisthesis of C4 on C5, likely chronic and facet mediated. No malalignment.  Vertebrae: Vertebral body heights maintained without evidence for acute or chronic fracture. C6 and C7 vertebral bodies are largely ankylosed. Underlying  bone marrow signal intensity somewhat diffusely decreased on T1 weighted imaging, suspected to be related history of CLL. No discrete or worrisome osseous lesions. Cord: Signal intensity within the cervical spinal cord is normal. No evidence for traumatic cord injury or myelomalacia. Posterior Fossa, vertebral arteries, paraspinal tissues: Craniocervical junction normal. Paraspinous and prevertebral soft tissues demonstrate no acute finding. No evidence for ligamentous injury. Prominent bilateral cervical adenopathy again noted, compatible with history of CLL. Normal intravascular flow voids seen within the vertebral arteries bilaterally. Disc levels: C2-C3: Mild bilateral uncovertebral and facet hypertrophy. No significant spinal stenosis. Mild right C3 foraminal narrowing. C3-C4: Broad-based posterior disc protrusion indents the ventral thecal sac. Mild facet and ligament flavum hypertrophy. Mild spinal stenosis without cord deformity. Superimposed uncovertebral hypertrophy with resultant moderate right worse than left C4 foraminal stenosis. C4-C5: Mild diffuse disc bulge with uncovertebral hypertrophy. Superimposed facet and ligament flavum hypertrophy. Resultant moderate spinal stenosis without significant cord deformity. Severe left with moderate right C5 foraminal narrowing. C5-C6: Diffuse circumferential disc osteophyte with intervertebral disc space narrowing. Superimposed facet and ligament flavum hypertrophy. Resultant severe spinal stenosis with mild cord flattening. No cord signal changes. Thecal sac measures 7 mm in AP diameter. Severe bilateral C6 foraminal narrowing, right worse than left. C6-C7: C6 and C7 vertebral bodies are partially ankylosed. Associated endplate osseous ridging with uncovertebral hypertrophy. Flattening of the ventral thecal sac  without significant spinal stenosis. Moderate right with mild left C7 foraminal stenosis. C7-T1: Mild disc bulge with right-sided uncovertebral hypertrophy. Mild right-sided facet degeneration. No spinal stenosis. Mild right C8 foraminal stenosis. Visualized upper thoracic spine demonstrates no significant finding. IMPRESSION: MRI HEAD IMPRESSION: 1. Small volume acute posttraumatic subarachnoid hemorrhage involving the medial right frontal lobe, stable from prior CT. No evidence for new or progressive hemorrhage. 2. No other acute intracranial abnormality. 3. Focal 1-2 cm osseous lesions involving the right parietal and occipital calvarium as above, indeterminate. Findings are of uncertain significance, with no definite corresponding osseous lesion seen on prior CT. Correlation with dedicated bone scan suggested for further evaluation. 4. Soft tissue contusion involving the forehead and central face with associated facial fractures, better seen on prior CT. 5. 13 mm meningioma overlying the left parietal convexity without associated edema. MRI CERVICAL SPINE IMPRESSION: 1. No acute traumatic injury or other finding within the cervical spine status post recent syncope and fall. No evidence for spinal cord or ligamentous injury. 2. Moderate multilevel cervical spondylolysis with resultant moderate spinal stenosis at C4-5 and more severe narrowing at C5-6. 3. Multifactorial degenerative changes with resultant multilevel foraminal narrowing as above. Notable findings include moderate bilateral C4 foraminal stenosis, severe left with moderate right C5 foraminal narrowing, severe bilateral C6 foraminal stenosis, with moderate right C7 foraminal narrowing. 4. Prominent bulky cervical adenopathy within the visualized neck, compatible with history of CLL. Electronically Signed   By: Jeannine Boga M.D.   On: 10/16/2018 02:32   Mr Cervical Spine Wo Contrast  Result Date: 10/16/2018 CLINICAL DATA:  Initial  evaluation for acute syncope, bilateral hand tingling. EXAM: MRI HEAD WITHOUT CONTRAST MRI CERVICAL SPINE WITHOUT CONTRAST TECHNIQUE: Multiplanar, multiecho pulse sequences of the brain and surrounding structures, and cervical spine, to include the craniocervical junction and cervicothoracic junction, were obtained without intravenous contrast. COMPARISON:  Prior CT from 10/15/2018 FINDINGS: MRI HEAD FINDINGS Brain: Cerebral volume within normal limits. No significant cerebral white matter changes for age. No abnormal foci of restricted diffusion to suggest acute or subacute ischemia. Gray-white matter differentiation maintained. No encephalomalacia to suggest  chronic cortical infarction. Scattered small volume susceptibility artifact overlying the medial aspect of the right superior frontal gyrus compatible with small volume posttraumatic subarachnoid hemorrhage, stable from prior CT. No other new or progressive intracranial hemorrhage. Trace layering blood products noted within the occipital horns of both lateral ventricles, compatible with redistribution. 13 mm meningioma overlies the parasagittal left parietal convexity (series 11, image 20). No associated edema or mass effect. No other mass lesion. No midline shift or mass effect. No hydrocephalus. No extra-axial fluid collection. Pituitary gland suprasellar region normal. Midline structures intact and normal. Vascular: Major intracranial vascular flow voids are well maintained. Skull and upper cervical spine: Craniocervical junction normal. Bone marrow signal intensity within normal limits. Focal 17 mm T1 hypointense lesion present within the right parietal calvarium (series 9, image 6). Additional possible 2 cm lesion posteriorly within the left occipital calvarium (series 9, image 15). Findings are indeterminate. No corresponding osseous lesion seen on prior CT. Soft tissue swelling seen at the forehead, extending inferiorly to involve the nasal bridge and  nose, also seen on prior maxillofacial CT. Associated maxillofacial fractures better evaluated on prior exam. Sinuses/Orbits: Globes and orbital soft tissues within normal limits. Probable right orbital fracture better seen on prior CT. Scattered mucosal thickening throughout the ethmoidal air cells and maxillary sinuses. No significant mastoid effusion. Inner ear structures normal. Other: None. MRI CERVICAL SPINE FINDINGS Alignment: Straightening with slight reversal of the normal cervical lordosis, apex at C5-6. Trace anterolisthesis of C4 on C5, likely chronic and facet mediated. No malalignment. Vertebrae: Vertebral body heights maintained without evidence for acute or chronic fracture. C6 and C7 vertebral bodies are largely ankylosed. Underlying bone marrow signal intensity somewhat diffusely decreased on T1 weighted imaging, suspected to be related history of CLL. No discrete or worrisome osseous lesions. Cord: Signal intensity within the cervical spinal cord is normal. No evidence for traumatic cord injury or myelomalacia. Posterior Fossa, vertebral arteries, paraspinal tissues: Craniocervical junction normal. Paraspinous and prevertebral soft tissues demonstrate no acute finding. No evidence for ligamentous injury. Prominent bilateral cervical adenopathy again noted, compatible with history of CLL. Normal intravascular flow voids seen within the vertebral arteries bilaterally. Disc levels: C2-C3: Mild bilateral uncovertebral and facet hypertrophy. No significant spinal stenosis. Mild right C3 foraminal narrowing. C3-C4: Broad-based posterior disc protrusion indents the ventral thecal sac. Mild facet and ligament flavum hypertrophy. Mild spinal stenosis without cord deformity. Superimposed uncovertebral hypertrophy with resultant moderate right worse than left C4 foraminal stenosis. C4-C5: Mild diffuse disc bulge with uncovertebral hypertrophy. Superimposed facet and ligament flavum hypertrophy. Resultant  moderate spinal stenosis without significant cord deformity. Severe left with moderate right C5 foraminal narrowing. C5-C6: Diffuse circumferential disc osteophyte with intervertebral disc space narrowing. Superimposed facet and ligament flavum hypertrophy. Resultant severe spinal stenosis with mild cord flattening. No cord signal changes. Thecal sac measures 7 mm in AP diameter. Severe bilateral C6 foraminal narrowing, right worse than left. C6-C7: C6 and C7 vertebral bodies are partially ankylosed. Associated endplate osseous ridging with uncovertebral hypertrophy. Flattening of the ventral thecal sac without significant spinal stenosis. Moderate right with mild left C7 foraminal stenosis. C7-T1: Mild disc bulge with right-sided uncovertebral hypertrophy. Mild right-sided facet degeneration. No spinal stenosis. Mild right C8 foraminal stenosis. Visualized upper thoracic spine demonstrates no significant finding. IMPRESSION: MRI HEAD IMPRESSION: 1. Small volume acute posttraumatic subarachnoid hemorrhage involving the medial right frontal lobe, stable from prior CT. No evidence for new or progressive hemorrhage. 2. No other acute intracranial abnormality. 3. Focal 1-2 cm osseous  lesions involving the right parietal and occipital calvarium as above, indeterminate. Findings are of uncertain significance, with no definite corresponding osseous lesion seen on prior CT. Correlation with dedicated bone scan suggested for further evaluation. 4. Soft tissue contusion involving the forehead and central face with associated facial fractures, better seen on prior CT. 5. 13 mm meningioma overlying the left parietal convexity without associated edema. MRI CERVICAL SPINE IMPRESSION: 1. No acute traumatic injury or other finding within the cervical spine status post recent syncope and fall. No evidence for spinal cord or ligamentous injury. 2. Moderate multilevel cervical spondylolysis with resultant moderate spinal stenosis at  C4-5 and more severe narrowing at C5-6. 3. Multifactorial degenerative changes with resultant multilevel foraminal narrowing as above. Notable findings include moderate bilateral C4 foraminal stenosis, severe left with moderate right C5 foraminal narrowing, severe bilateral C6 foraminal stenosis, with moderate right C7 foraminal narrowing. 4. Prominent bulky cervical adenopathy within the visualized neck, compatible with history of CLL. Electronically Signed   By: Jeannine Boga M.D.   On: 10/16/2018 02:32   Ct Maxillofacial Wo Cm  Result Date: 10/15/2018 CLINICAL DATA:  78 year old male status post syncope and blunt trauma. Nose and forehead lacerations. EXAM: CT HEAD WITHOUT CONTRAST CT MAXILLOFACIAL WITHOUT CONTRAST CT CERVICAL SPINE WITHOUT CONTRAST TECHNIQUE: Multidetector CT imaging of the head, cervical spine, and maxillofacial structures were performed using the standard protocol without intravenous contrast. Multiplanar CT image reconstructions of the cervical spine and maxillofacial structures were also generated. COMPARISON:  Paranasal sinus CT 04/06/2011. FINDINGS: CT HEAD FINDINGS Brain: Trace subarachnoid hemorrhage along the medial aspect of the right superior frontal gyrus on series 3, image 20 and series 5, image 21. No subdural blood or other extra-axial hemorrhage identified. No intraventricular hemorrhage. No parenchymal hemorrhagic contusion identified. Partially calcified left frontoparietal convexity 11-13 millimeter meningioma suspected (series 3, image 26 and coronal image 52). No significant mass effect. No cerebral edema. Gray-white matter differentiation is within normal limits for age. No ventriculomegaly or intracranial mass effect. No cortically based acute infarct identified. Vascular: Calcified atherosclerosis at the skull base. No suspicious intracranial vascular hyperdensity. Skull: Calvarium appears intact. See facial bone findings below. Other: Forehead scalp hematoma  tracks cephalad. See facial bone findings below. Other scalp soft tissues appear negative. CT MAXILLOFACIAL FINDINGS Osseous: Comminuted and impacted bilateral anterior nasal bone fractures with associated nasal bridge soft tissue swelling and soft tissue gas. The maxillary nasal processes remain intact. However, there is mild associated deformity of the anterior superior nasal septum. There is also abnormal soft tissue gas in and around the right medial frontoethmoidal confluence, with a possible nondisplaced fracture there on series 8, image 64. Otherwise the anterior walls of the frontal sinuses appear intact. Nondisplaced fracture versus nutrient foramen of the right mandible in the parasymphyseal region best seen on series 13, image 42. Elsewhere the mandible appears intact and normally aligned, but there is superimposed poor dentition. No maxilla or zygoma fracture. Poor maxillary dentition posteriorly. Central skull base intact. Pterygoid plates intact. Orbits: Trace gas in the anterior superior right orbit suspicious for nondisplaced lamina papyracea fracture there on series 8, image 70 and series 12, image 33. Other orbital walls are intact. Globes are intact. There is preseptal soft tissue swelling/hematoma, but no intraorbital hematoma. No other intraorbital gas. Sinuses: Trace hemorrhage layering in the right frontal sinus (series 6, image 68) associated with the nondisplaced frontoethmoidal fractures described above. Mild right anterior ethmoid bladder mucosal thickening. Minimal to mild paranasal sinus mucosal thickening elsewhere.  Small volume retained secretions in the nasal cavity. Leftward anterior nasal septal deviation may be acute and associated with the nasal bone fractures described earlier. Negative nasal cavity otherwise. Tympanic cavities and mastoids are clear. Soft tissues: Negative visible noncontrast larynx, pharynx, parapharyngeal spaces, retropharyngeal space, sublingual space,  submandibular and parotid glands. There is moderate bilateral level 1, level 2, and visible level 3 lymphadenopathy with enlarged individual nodes up to 17 millimeters short axis. CT CERVICAL SPINE FINDINGS Alignment: Mild reversal of cervical lordosis. Cervicothoracic junction alignment is within normal limits. Bilateral posterior element alignment is within normal limits. Skull base and vertebrae: Visualized skull base is intact. No atlanto-occipital dissociation. No acute osseous abnormality identified. Soft tissues and spinal canal: No prevertebral fluid or swelling. No visible canal hematoma. Generalized bilateral cervical lymphadenopathy continuing to the thoracic inlet (series 15, image 64). Disc levels: Congenital incomplete segmentation versus acquired ankylosis of C6-C7. Advanced cervical disc and endplate degeneration elsewhere. At least mild multifactorial spinal stenosis at C4-C5 and C5-C6. Upper chest: Negative lung apices. Grossly intact visible upper thoracic levels. IMPRESSION: 1. Positive for trace posttraumatic subarachnoid hemorrhage along the medial aspect of the right superior frontal gyrus. No other acute traumatic injury to the brain identified. 2. Positive for generalized cervical Lymphadenopathy continuing to the thoracic inlet. On discussion with Dr. Tyrone Nine in the ED he advises the patient has chronic lymphocytic leukemia (CLL) which explains this appearance. 3. Comminuted and impacted bilateral nasal bone fractures. Probable associated fracture of the anterior superior nasal septum. Nondisplaced fracture of the anterior right frontoethmoidal confluence suspected, with mild regional soft tissue gas. Nondisplaced fracture of the right anterior lamina papyracea with trace gas in the anterior superior right orbit. No intraorbital hematoma. 4. Probable nutrient foramen of the right mandible parasymphysis, less likely a nondisplaced fracture (series 13, image 42). 5. No acute fracture in the  cervical spine. 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 Study discussed by telephone with Dr. Tyrone Nine on 10/15/2018 at 19:50 . Electronically Signed   By: Genevie Ann M.D.   On: 10/15/2018 19:56     EKG: Independently reviewed.  Sinus rhythm, QTC 452, LAE, anteroseptal infarction pattern.  Assessment/Plan Principal Problem:   Syncope Active Problems:   CLL (chronic lymphocytic leukemia) (HCC)   Hypertension   HLD (hyperlipidemia)   Nasal bone fracture   Chest tightness   SAH (subarachnoid hemorrhage) (HCC)   Acute renal failure superimposed on stage 3 chronic kidney disease (HCC)   Thrombocytopenia (HCC)   Fall   Elevated troponin   Syncope: etiology is not clear. MRI-brain did not show stroke. MRI showed a 13 mm meningioma overlying the left parietal convexity without associated edema. Pt had chest tightness and shortness of breath, CT angiogram of chest is negative for PE.  Initial troponin negative, but the repeated troponin is +0.07, indicating possible cardiac etiology.   - Place on tele bed for obs - Orthostatic vital signs  - 2d echo - Neuro checks  - IVF: NS 75 cc/h - f/u trop x 3 - card consult is requested via Epic  SAH (subarachnoid hemorrhage) ([place]): pt has bilateral hand numbness. Dr. Arnoldo Morale of neurosurgery was consulted by EDP. Recommended MRI of C-spine, which was done, and did not show acute traumatic injury and no evidence for spinal cord or ligamentous injury, but showed some disc disease. -f/u neurosurgeon's recommendation -Frequent neuro check  Fall and Nasal bone fracture: ENT, [place] was consulted by EDP--> recommended to follow-up  as outpatient in the office. -Started Unasyn for prophylaxis -Pain control -Wound care consult for skin laceration -pt/ot  CLL (chronic lymphocytic leukemia) and thrombocytopenia: under observation currently -Patient is to follow-up with [name]  HTN:   -change Ziac to bisoporlol and hold HCTZ due to worsening renal function -IV hydralazine prn  HLD (hyperlipidemia): -zocor  Chest tightness and elevated trop: trop 0.07.  - cycle CE q6 x3 and repeat EKG in the am  - prn Nitroglycerin, Morphine, zocor - will not start ASA due to [facility] - Risk factor stratification: will check FLP and A1C  - f/u 2d echo - card consult is requested via Epic  AoCKD-III: Baseline Cre is 1.2-1.35, pt's Cre is 1.84 and BUN 28 on admission. Likely due to dehydration and continuation of diuretics, - IVF as above - Follow up renal function by BMP - Hold hctZ    DVT ppx: SCD Code Status: Full code Family Communication: None at bed side. Disposition Plan:  Anticipate discharge back to previous home environment Consults called: ENT, Dr. Dellie Burns and neurosurgeon, Dr. Arnoldo Morale Admission status: Obs / tele    Date of Service 10/16/2018    [facility]   If 7PM-7AM, please contact night-coverage [email or web address] Password [email or web address] 10/16/2018, 6:31 AM

## 2018-10-15 NOTE — ED Provider Notes (Addendum)
[facility] EMERGENCY DEPARTMENT Provider Note   CSN: [id number] Arrival date & time: 10/15/18  1725    History   Chief Complaint Chief Complaint  Patient presents with   Loss of Consciousness    HPI [name] is a 78 y.o. male with history of CLL, hypertension, hyperlipidemia presents for evaluation of acute onset, resolved syncopal episode beginning just prior to arrival.  He reports that he was taking a walk when he began to experience intermittent chest tightness that was generalized.  He did note some shortness of breath associated with it.  He does not remember if he felt lightheaded but believes that he lost consciousness and fell because the next thing he remembers is a [employer] neighbor standing over him calling for EMS.  He has lacerations to the front of his face and notes some pain surrounding this area which he reports is mild and dull in nature.  He notes blurred vision bilaterally but states that he is not wearing his glasses and so this is not unusual for him.  He notes bilateral hand tingling which he describes as "more severe than tingling", right worse than left.  He denies any chest pain or shortness of breath at this time.  He notes some nausea but denies any vomiting or abdominal pain.  He does note that he recently returned from [place] in mid March and has been quarantining at home since then.  He notes no known sick contacts.  He states that he has been experiencing this chest tightness with ambulation intermittently for a couple of weeks now, ever since returning from [place].  He reports he typically notices this especially while going up an incline and the chest tightness was markedly worse today. He is not currently anticoagulated and has a history of thrombocytopenia due to his CLL.  He is unsure if he is up-to-date on his tetanus.    The history is provided by the patient.    Past Medical History:  Diagnosis Date   Arthritis     Carpal tunnel syndrome, bilateral 10/09/2017   GERD (gastroesophageal reflux disease)    Hepatitis 1966   Drug reaction after taking medication   Hyperlipemia    Hypertension    Lung nodule seen on imaging study    bilateral lungs    Patient Active Problem List   Diagnosis Date Noted   HLD (hyperlipidemia) 10/15/2018   Syncope 10/15/2018   Nasal bone fracture 10/15/2018   Chest tightness 10/15/2018   SAH (subarachnoid hemorrhage) ([place]) 10/15/2018   Acute renal failure superimposed on stage 3 chronic kidney disease ([place]) 10/15/2018   Thrombocytopenia ([place]) 10/15/2018   Fall 10/15/2018   Carpal tunnel syndrome, bilateral 10/09/2017   Right foot pain    Enterobacter sepsis ([place]) 08/19/2016   Bacteremia due to Enterobacter species 08/19/2016   Acute encephalopathy 08/17/2016   Hyperglycemia    Urinary tract infection with hematuria    Hypertension 08/16/2016   AKI (acute kidney injury) ([place]) 08/16/2016   Hypokalemia 08/16/2016   CLL (chronic lymphocytic leukemia) ([place]) 12/16/2014   Dyspnea 09/20/2012    Past Surgical History:  Procedure Laterality Date   APPENDECTOMY     arthroscoyp  2000   left knee   COLONOSCOPY W/ POLYPECTOMY     LUMBAR LAMINECTOMY/DECOMPRESSION MICRODISCECTOMY  06/27/2012   Procedure: LUMBAR LAMINECTOMY/DECOMPRESSION MICRODISCECTOMY 1 LEVEL;  Surgeon: [name], MD;  Location: [facility];  Service: Neurosurgery;  Laterality: Bilateral;  bilateral four-five laminectony  SKIN CANCER EXCISION  5-6 years ago   moe-s surgery- basal b   TONSILLECTOMY          Home Medications    Prior to Admission medications   Medication Sig Start Date End Date Taking? Authorizing Provider  bisoprolol-hydrochlorothiazide (ZIAC) 10-6.25 MG per tablet Take 1 tablet by mouth daily.   Yes [provider]  Cholecalciferol (VITAMIN D) 2000 units CAPS Take 2,000 Units by mouth daily.    Yes [provider]  Loratadine 10  MG CAPS Take 10 mg by mouth.   Yes [provider]  simvastatin (ZOCOR) 40 MG tablet Take 20 mg by mouth daily.    Yes [provider]  Tamsulosin HCl (FLOMAX) 0.4 MG CAPS Take 1 capsule (0.4 mg total) by mouth daily. 06/30/12  Yes Jovita Gamma, MD  EPINEPHrine (EPIPEN) 0.3 mg/0.3 mL DEVI Inject 0.3 mLs (0.3 mg total) into the muscle once. Patient not taking: Reported on 08/17/2017 05/26/12   Rise Mu, PA-C    Family History Family History  Problem Relation Age of Onset   Sudden death Mother 6       possible botulism   Dementia Father    Seizures Sister     Social History Social History   Tobacco Use   Smoking status: Never Smoker   Smokeless tobacco: Never Used  Substance Use Topics   Alcohol use: Yes    Alcohol/week: 2.0 standard drinks    Types: 2 Cans of beer per week    Comment: rare 2 beers per month   Drug use: No     Allergies   Patient has no known allergies.   Review of Systems Review of Systems  Constitutional: Negative for chills and fever.  Eyes: Positive for visual disturbance.  Respiratory: Positive for shortness of breath.   Cardiovascular: Positive for chest pain. Negative for leg swelling.  Gastrointestinal: Positive for nausea. Negative for abdominal pain and vomiting.  Musculoskeletal: Negative for back pain and neck pain.  Skin: Positive for wound.  Neurological: Positive for syncope and headaches. Negative for weakness and numbness.  All other systems reviewed and are negative.    Physical Exam Updated Vital Signs BP (!) 154/74 (BP Location: Right Arm)    Pulse (!) 57    Temp 98.3 F (36.8 C) (Oral)    Resp 14    Ht 5\' 3"  (1.6 m)    Wt 73.8 kg    SpO2 100%    BMI 28.82 kg/m   Physical Exam Vitals signs and nursing note reviewed.  Constitutional:      General: He is not in acute distress.    Appearance: He is well-developed.  HENT:     Head: Normocephalic.     Comments: See below image.  Patient with  wounds to the forehead, nose, and philtrum.  Slow trickle of bleeding but overall controlled.  No septal hematoma noted.  He has a small amount of blood to the posterior oropharynx.  No hemotympanum bilaterally.  No battle signs but does have bilateral periorbital ecchymosis.  Mild tenderness to palpation surrounding his wounds but no crepitus.  No tenderness to palpation of the skull though he does have a small amount of wet blood to the left parietal region. Eyes:     General:        Right eye: No discharge.        Left eye: No discharge.     Conjunctiva/sclera: Conjunctivae normal.     Pupils: Pupils  are equal, round, and reactive to light.     Comments: Pupils 2 mm bilaterally, minimally reactive to light.  No pain with EOMs or restricted EOMs noted.  Neck:     Vascular: No JVD.     Trachea: No tracheal deviation.     Comments: No midline spine TTP, no paraspinal muscle tenderness, no deformity, crepitus, or step-off noted. C-collar applied.   Cardiovascular:     Rate and Rhythm: Normal rate and regular rhythm.     Pulses: Normal pulses.     Comments: 2+ radial and DP/PT pulses bilaterally, Homans sign absent bilaterally. Pulmonary:     Effort: Pulmonary effort is normal.  Chest:     Chest wall: No tenderness.  Abdominal:     General: Abdomen is flat. There is no distension.     Palpations: Abdomen is soft.     Tenderness: There is no abdominal tenderness. There is no guarding or rebound.  Musculoskeletal:     Comments: Ecchymosis to the medial right calf.  Mildly tender to palpation.  Pelvis stable.  5/5 strength of BUE and BLE major muscle groups.  Normal passive range of motion of the bilateral upper and lower extremities.  Superficial abrasions noted to the anterior aspect of the knees bilaterally.  Skin:    General: Skin is warm and dry.     Findings: No erythema.  Neurological:     Mental Status: He is alert.     Comments: Fluent speech with no evidence of dysarthria or  aphasia.  No facial droop.  Cranial nerves appear grossly intact.  Moves extremities spontaneously with intact strength.  Sensation intact to soft touch of bilateral upper and lower extremities save for subjective paresthesias of the fingertips bilaterally. No pronator drift.  Psychiatric:        Behavior: Behavior normal.        ED Treatments / Results  Labs (all labs ordered are listed, but only abnormal results are displayed) Labs Reviewed  BASIC METABOLIC PANEL - Abnormal; Notable for the following components:      Result Value   CO2 21 (*)    Glucose, Bld 101 (*)    BUN 28 (*)    Creatinine, Ser 1.84 (*)    GFR calc non Af Amer 35 (*)    GFR calc Af Amer 40 (*)    All other components within normal limits  CBC WITH DIFFERENTIAL/PLATELET - Abnormal; Notable for the following components:   WBC 47.3 (*)    RBC 4.19 (*)    Hemoglobin 12.5 (*)    Platelets 36 (*)    Neutro Abs 16.6 (*)    Lymphs Abs 28.4 (*)    Monocytes Absolute 1.4 (*)    Basophils Absolute 0.5 (*)    All other components within normal limits  URINALYSIS, ROUTINE W REFLEX MICROSCOPIC - Abnormal; Notable for the following components:   Hgb urine dipstick SMALL (*)    Ketones, ur 5 (*)    All other components within normal limits  TROPONIN I - Abnormal; Notable for the following components:   Troponin I 0.07 (*)    All other components within normal limits  I-STAT CREATININE, ED - Abnormal; Notable for the following components:   Creatinine, Ser 1.70 (*)    All other components within normal limits  TROPONIN I  HEMOGLOBIN A1C  LIPID PANEL  TROPONIN I  TROPONIN I  BASIC METABOLIC PANEL  CBC    EKG EKG Interpretation  Date/Time:  Monday  October 15 2018 17:32:58 EDT Ventricular Rate:  62 PR Interval:    QRS Duration: 93 QT Interval:  445 QTC Calculation: 452 R Axis:   78 Text Interpretation:  Sinus rhythm Ventricular premature complex Since last tracing rate slower no wpw, prolonged qt or  brugada Otherwise no significant change Confirmed by Deno Etienne [id number]) on 10/15/2018 5:39:43 PM   Radiology Ct Head Wo Contrast  Result Date: 10/15/2018 CLINICAL DATA:  78 year old male status post syncope and blunt trauma. Nose and forehead lacerations. EXAM: CT HEAD WITHOUT CONTRAST CT MAXILLOFACIAL WITHOUT CONTRAST CT CERVICAL SPINE WITHOUT CONTRAST TECHNIQUE: Multidetector CT imaging of the head, cervical spine, and maxillofacial structures were performed using the standard protocol without intravenous contrast. Multiplanar CT image reconstructions of the cervical spine and maxillofacial structures were also generated. COMPARISON:  Paranasal sinus CT 04/06/2011. FINDINGS: CT HEAD FINDINGS Brain: Trace subarachnoid hemorrhage along the medial aspect of the right superior frontal gyrus on series 3, image 20 and series 5, image 21. No subdural blood or other extra-axial hemorrhage identified. No intraventricular hemorrhage. No parenchymal hemorrhagic contusion identified. Partially calcified left frontoparietal convexity 11-13 millimeter meningioma suspected (series 3, image 26 and coronal image 52). No significant mass effect. No cerebral edema. Gray-white matter differentiation is within normal limits for age. No ventriculomegaly or intracranial mass effect. No cortically based acute infarct identified. Vascular: Calcified atherosclerosis at the skull base. No suspicious intracranial vascular hyperdensity. Skull: Calvarium appears intact. See facial bone findings below. Other: Forehead scalp hematoma tracks cephalad. See facial bone findings below. Other scalp soft tissues appear negative. CT MAXILLOFACIAL FINDINGS Osseous: Comminuted and impacted bilateral anterior nasal bone fractures with associated nasal bridge soft tissue swelling and soft tissue gas. The maxillary nasal processes remain intact. However, there is mild associated deformity of the anterior superior nasal septum. There is also abnormal  soft tissue gas in and around the right medial frontoethmoidal confluence, with a possible nondisplaced fracture there on series 8, image 64. Otherwise the anterior walls of the frontal sinuses appear intact. Nondisplaced fracture versus nutrient foramen of the right mandible in the parasymphyseal region best seen on series 13, image 42. Elsewhere the mandible appears intact and normally aligned, but there is superimposed poor dentition. No maxilla or zygoma fracture. Poor maxillary dentition posteriorly. Central skull base intact. Pterygoid plates intact. Orbits: Trace gas in the anterior superior right orbit suspicious for nondisplaced lamina papyracea fracture there on series 8, image 70 and series 12, image 33. Other orbital walls are intact. Globes are intact. There is preseptal soft tissue swelling/hematoma, but no intraorbital hematoma. No other intraorbital gas. Sinuses: Trace hemorrhage layering in the right frontal sinus (series 6, image 68) associated with the nondisplaced frontoethmoidal fractures described above. Mild right anterior ethmoid bladder mucosal thickening. Minimal to mild paranasal sinus mucosal thickening elsewhere. Small volume retained secretions in the nasal cavity. Leftward anterior nasal septal deviation may be acute and associated with the nasal bone fractures described earlier. Negative nasal cavity otherwise. Tympanic cavities and mastoids are clear. Soft tissues: Negative visible noncontrast larynx, pharynx, parapharyngeal spaces, retropharyngeal space, sublingual space, submandibular and parotid glands. There is moderate bilateral level 1, level 2, and visible level 3 lymphadenopathy with enlarged individual nodes up to 17 millimeters short axis. CT CERVICAL SPINE FINDINGS Alignment: Mild reversal of cervical lordosis. Cervicothoracic junction alignment is within normal limits. Bilateral posterior element alignment is within normal limits. Skull base and vertebrae: Visualized  skull base is intact. No atlanto-occipital dissociation. No acute osseous abnormality identified.  Soft tissues and spinal canal: No prevertebral fluid or swelling. No visible canal hematoma. Generalized bilateral cervical lymphadenopathy continuing to the thoracic inlet (series 15, image 64). Disc levels: Congenital incomplete segmentation versus acquired ankylosis of C6-C7. Advanced cervical disc and endplate degeneration elsewhere. At least mild multifactorial spinal stenosis at C4-C5 and C5-C6. Upper chest: Negative lung apices. Grossly intact visible upper thoracic levels. IMPRESSION: 1. Positive for trace posttraumatic subarachnoid hemorrhage along the medial aspect of the right superior frontal gyrus. No other acute traumatic injury to the brain identified. 2. Positive for generalized cervical Lymphadenopathy continuing to the thoracic inlet. On discussion with Dr. Tyrone Nine in the ED he advises the patient has chronic lymphocytic leukemia (CLL) which explains this appearance. 3. Comminuted and impacted bilateral nasal bone fractures. Probable associated fracture of the anterior superior nasal septum. Nondisplaced fracture of the anterior right frontoethmoidal confluence suspected, with mild regional soft tissue gas. Nondisplaced fracture of the right anterior lamina papyracea with trace gas in the anterior superior right orbit. No intraorbital hematoma. 4. Probable nutrient foramen of the right mandible parasymphysis, less likely a nondisplaced fracture (series 13, image 42). 5. No acute fracture in the cervical spine. 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 Study discussed by telephone with Dr. Tyrone Nine on 10/15/2018 at 19:50 . Electronically Signed   By: Genevie Ann M.D.   On: 10/15/2018 19:56   Ct Angio Chest Pe W And/or Wo Contrast  Result Date: 10/15/2018 CLINICAL DATA:  78 year old male status post syncope and blunt trauma. Chest tightness,  shortness of breath. CLL. EXAM: CT ANGIOGRAPHY CHEST WITH CONTRAST TECHNIQUE: Multidetector CT imaging of the chest was performed using the standard protocol during bolus administration of intravenous contrast. Multiplanar CT image reconstructions and MIPs were obtained to evaluate the vascular anatomy. CONTRAST:  2mL OMNIPAQUE IOHEXOL 350 MG/ML SOLN COMPARISON:  Head face and cervical spine CT today reported separately. Chest radiographs 08/17/2016. Chest CT 03/16/2009. FINDINGS: Cardiovascular: Good contrast bolus timing in the pulmonary arterial tree. Mild respiratory motion. No focal filling defect identified in the pulmonary arteries to suggest acute pulmonary embolism. Negative visible aorta aside from mild atherosclerosis. Mild cardiomegaly. No pericardial effusion. Mediastinum/Nodes: Lower cervical lymphadenopathy continuing to the thoracic inlet as seen on the face and cervical spine imaging today. There is mild to moderate mediastinal and hilar lymphadenopathy, new since 2010. Individual nodes measure up to 12 millimeter short axis. Lungs/Pleura: Major airways are patent. No pneumothorax or pleural effusion. No pulmonary contusion. Mild chronic scarring in the lingula and medial segment of the right middle lobe. There is a round 10-11 millimeter anterior basal segment left lower lobe lung nodule which was 7 millimeters in 2010. This is most likely benign. Upper Abdomen: Partially visible splenomegaly, new since 2010. Negative visible liver and bowel in the upper abdomen. Musculoskeletal: No rib or sternal fracture identified. Thoracic spine degeneration. No acute osseous abnormality identified. Review of the MIP images confirms the above findings. IMPRESSION: 1. No evidence of acute pulmonary embolus. No acute traumatic injury identified in the chest. 2. History of CLL with evidence of active disease: splenomegaly, cervical and mediastinal lymphadenopathy. 3. No acute pulmonary abnormality. A solitary  lung nodule in the left lower lobe has minimally increased from 7 to 11 mm over the past 10 years compatible with benign etiology. 4. Mild cardiomegaly. Electronically Signed   By: Genevie Ann M.D.   On: 10/15/2018 20:01   Ct Cervical Spine Wo Contrast  Result Date: 10/15/2018  CLINICAL DATA:  78 year old male status post syncope and blunt trauma. Nose and forehead lacerations. EXAM: CT HEAD WITHOUT CONTRAST CT MAXILLOFACIAL WITHOUT CONTRAST CT CERVICAL SPINE WITHOUT CONTRAST TECHNIQUE: Multidetector CT imaging of the head, cervical spine, and maxillofacial structures were performed using the standard protocol without intravenous contrast. Multiplanar CT image reconstructions of the cervical spine and maxillofacial structures were also generated. COMPARISON:  Paranasal sinus CT 04/06/2011. FINDINGS: CT HEAD FINDINGS Brain: Trace subarachnoid hemorrhage along the medial aspect of the right superior frontal gyrus on series 3, image 20 and series 5, image 21. No subdural blood or other extra-axial hemorrhage identified. No intraventricular hemorrhage. No parenchymal hemorrhagic contusion identified. Partially calcified left frontoparietal convexity 11-13 millimeter meningioma suspected (series 3, image 26 and coronal image 52). No significant mass effect. No cerebral edema. Gray-white matter differentiation is within normal limits for age. No ventriculomegaly or intracranial mass effect. No cortically based acute infarct identified. Vascular: Calcified atherosclerosis at the skull base. No suspicious intracranial vascular hyperdensity. Skull: Calvarium appears intact. See facial bone findings below. Other: Forehead scalp hematoma tracks cephalad. See facial bone findings below. Other scalp soft tissues appear negative. CT MAXILLOFACIAL FINDINGS Osseous: Comminuted and impacted bilateral anterior nasal bone fractures with associated nasal bridge soft tissue swelling and soft tissue gas. The maxillary nasal processes  remain intact. However, there is mild associated deformity of the anterior superior nasal septum. There is also abnormal soft tissue gas in and around the right medial frontoethmoidal confluence, with a possible nondisplaced fracture there on series 8, image 64. Otherwise the anterior walls of the frontal sinuses appear intact. Nondisplaced fracture versus nutrient foramen of the right mandible in the parasymphyseal region best seen on series 13, image 42. Elsewhere the mandible appears intact and normally aligned, but there is superimposed poor dentition. No maxilla or zygoma fracture. Poor maxillary dentition posteriorly. Central skull base intact. Pterygoid plates intact. Orbits: Trace gas in the anterior superior right orbit suspicious for nondisplaced lamina papyracea fracture there on series 8, image 70 and series 12, image 33. Other orbital walls are intact. Globes are intact. There is preseptal soft tissue swelling/hematoma, but no intraorbital hematoma. No other intraorbital gas. Sinuses: Trace hemorrhage layering in the right frontal sinus (series 6, image 68) associated with the nondisplaced frontoethmoidal fractures described above. Mild right anterior ethmoid bladder mucosal thickening. Minimal to mild paranasal sinus mucosal thickening elsewhere. Small volume retained secretions in the nasal cavity. Leftward anterior nasal septal deviation may be acute and associated with the nasal bone fractures described earlier. Negative nasal cavity otherwise. Tympanic cavities and mastoids are clear. Soft tissues: Negative visible noncontrast larynx, pharynx, parapharyngeal spaces, retropharyngeal space, sublingual space, submandibular and parotid glands. There is moderate bilateral level 1, level 2, and visible level 3 lymphadenopathy with enlarged individual nodes up to 17 millimeters short axis. CT CERVICAL SPINE FINDINGS Alignment: Mild reversal of cervical lordosis. Cervicothoracic junction alignment is  within normal limits. Bilateral posterior element alignment is within normal limits. Skull base and vertebrae: Visualized skull base is intact. No atlanto-occipital dissociation. No acute osseous abnormality identified. Soft tissues and spinal canal: No prevertebral fluid or swelling. No visible canal hematoma. Generalized bilateral cervical lymphadenopathy continuing to the thoracic inlet (series 15, image 64). Disc levels: Congenital incomplete segmentation versus acquired ankylosis of C6-C7. Advanced cervical disc and endplate degeneration elsewhere. At least mild multifactorial spinal stenosis at C4-C5 and C5-C6. Upper chest: Negative lung apices. Grossly intact visible upper thoracic levels. IMPRESSION: 1. Positive for trace posttraumatic subarachnoid hemorrhage  along the medial aspect of the right superior frontal gyrus. No other acute traumatic injury to the brain identified. 2. Positive for generalized cervical Lymphadenopathy continuing to the thoracic inlet. On discussion with Dr. Tyrone Nine in the ED he advises the patient has chronic lymphocytic leukemia (CLL) which explains this appearance. 3. Comminuted and impacted bilateral nasal bone fractures. Probable associated fracture of the anterior superior nasal septum. Nondisplaced fracture of the anterior right frontoethmoidal confluence suspected, with mild regional soft tissue gas. Nondisplaced fracture of the right anterior lamina papyracea with trace gas in the anterior superior right orbit. No intraorbital hematoma. 4. Probable nutrient foramen of the right mandible parasymphysis, less likely a nondisplaced fracture (series 13, image 42). 5. No acute fracture in the cervical spine. 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 Study discussed by telephone with Dr. Tyrone Nine on 10/15/2018 at 19:50 . Electronically Signed   By: Genevie Ann M.D.   On: 10/15/2018 19:56   Ct Maxillofacial Wo Cm  Result Date:  10/15/2018 CLINICAL DATA:  78 year old male status post syncope and blunt trauma. Nose and forehead lacerations. EXAM: CT HEAD WITHOUT CONTRAST CT MAXILLOFACIAL WITHOUT CONTRAST CT CERVICAL SPINE WITHOUT CONTRAST TECHNIQUE: Multidetector CT imaging of the head, cervical spine, and maxillofacial structures were performed using the standard protocol without intravenous contrast. Multiplanar CT image reconstructions of the cervical spine and maxillofacial structures were also generated. COMPARISON:  Paranasal sinus CT 04/06/2011. FINDINGS: CT HEAD FINDINGS Brain: Trace subarachnoid hemorrhage along the medial aspect of the right superior frontal gyrus on series 3, image 20 and series 5, image 21. No subdural blood or other extra-axial hemorrhage identified. No intraventricular hemorrhage. No parenchymal hemorrhagic contusion identified. Partially calcified left frontoparietal convexity 11-13 millimeter meningioma suspected (series 3, image 26 and coronal image 52). No significant mass effect. No cerebral edema. Gray-white matter differentiation is within normal limits for age. No ventriculomegaly or intracranial mass effect. No cortically based acute infarct identified. Vascular: Calcified atherosclerosis at the skull base. No suspicious intracranial vascular hyperdensity. Skull: Calvarium appears intact. See facial bone findings below. Other: Forehead scalp hematoma tracks cephalad. See facial bone findings below. Other scalp soft tissues appear negative. CT MAXILLOFACIAL FINDINGS Osseous: Comminuted and impacted bilateral anterior nasal bone fractures with associated nasal bridge soft tissue swelling and soft tissue gas. The maxillary nasal processes remain intact. However, there is mild associated deformity of the anterior superior nasal septum. There is also abnormal soft tissue gas in and around the right medial frontoethmoidal confluence, with a possible nondisplaced fracture there on series 8, image 64.  Otherwise the anterior walls of the frontal sinuses appear intact. Nondisplaced fracture versus nutrient foramen of the right mandible in the parasymphyseal region best seen on series 13, image 42. Elsewhere the mandible appears intact and normally aligned, but there is superimposed poor dentition. No maxilla or zygoma fracture. Poor maxillary dentition posteriorly. Central skull base intact. Pterygoid plates intact. Orbits: Trace gas in the anterior superior right orbit suspicious for nondisplaced lamina papyracea fracture there on series 8, image 70 and series 12, image 33. Other orbital walls are intact. Globes are intact. There is preseptal soft tissue swelling/hematoma, but no intraorbital hematoma. No other intraorbital gas. Sinuses: Trace hemorrhage layering in the right frontal sinus (series 6, image 68) associated with the nondisplaced frontoethmoidal fractures described above. Mild right anterior ethmoid bladder mucosal thickening. Minimal to mild paranasal sinus mucosal thickening elsewhere. Small volume retained secretions in the nasal cavity. Leftward anterior nasal  septal deviation may be acute and associated with the nasal bone fractures described earlier. Negative nasal cavity otherwise. Tympanic cavities and mastoids are clear. Soft tissues: Negative visible noncontrast larynx, pharynx, parapharyngeal spaces, retropharyngeal space, sublingual space, submandibular and parotid glands. There is moderate bilateral level 1, level 2, and visible level 3 lymphadenopathy with enlarged individual nodes up to 17 millimeters short axis. CT CERVICAL SPINE FINDINGS Alignment: Mild reversal of cervical lordosis. Cervicothoracic junction alignment is within normal limits. Bilateral posterior element alignment is within normal limits. Skull base and vertebrae: Visualized skull base is intact. No atlanto-occipital dissociation. No acute osseous abnormality identified. Soft tissues and spinal canal: No prevertebral  fluid or swelling. No visible canal hematoma. Generalized bilateral cervical lymphadenopathy continuing to the thoracic inlet (series 15, image 64). Disc levels: Congenital incomplete segmentation versus acquired ankylosis of C6-C7. Advanced cervical disc and endplate degeneration elsewhere. At least mild multifactorial spinal stenosis at C4-C5 and C5-C6. Upper chest: Negative lung apices. Grossly intact visible upper thoracic levels. IMPRESSION: 1. Positive for trace posttraumatic subarachnoid hemorrhage along the medial aspect of the right superior frontal gyrus. No other acute traumatic injury to the brain identified. 2. Positive for generalized cervical Lymphadenopathy continuing to the thoracic inlet. On discussion with Dr. Tyrone Nine in the ED he advises the patient has chronic lymphocytic leukemia (CLL) which explains this appearance. 3. Comminuted and impacted bilateral nasal bone fractures. Probable associated fracture of the anterior superior nasal septum. Nondisplaced fracture of the anterior right frontoethmoidal confluence suspected, with mild regional soft tissue gas. Nondisplaced fracture of the right anterior lamina papyracea with trace gas in the anterior superior right orbit. No intraorbital hematoma. 4. Probable nutrient foramen of the right mandible parasymphysis, less likely a nondisplaced fracture (series 13, image 42). 5. No acute fracture in the cervical spine. 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 Study discussed by telephone with Dr. Tyrone Nine on 10/15/2018 at 19:50 . Electronically Signed   By: Genevie Ann M.D.   On: 10/15/2018 19:56    Procedures .Marland KitchenLaceration Repair Date/Time: 10/15/2018 11:41 PM Performed by: Renita Papa, PA-C Authorized by: Renita Papa, PA-C   Consent:    Consent obtained:  Verbal   Consent given by:  Patient   Risks discussed:  Infection, need for additional repair, pain, poor cosmetic result and poor  wound healing   Alternatives discussed:  No treatment and delayed treatment Universal protocol:    Procedure explained and questions answered to patient or proxy's satisfaction: yes     Relevant documents present and verified: yes     Test results available and properly labeled: yes     Imaging studies available: yes     Required blood products, implants, devices, and special equipment available: yes     Site/side marked: yes     Immediately prior to procedure, a time out was called: yes     Patient identity confirmed:  Verbally with patient Anesthesia (see MAR for exact dosages):    Anesthesia method:  Local infiltration   Local anesthetic:  Lidocaine 2% WITH epi Laceration details:    Location:  Face   Face location:  Forehead   Length (cm):  6   Depth (mm):  8 Repair type:    Repair type:  Simple Pre-procedure details:    Preparation:  Patient was prepped and draped in usual sterile fashion and imaging obtained to evaluate for foreign bodies Exploration:    Hemostasis achieved with:  Direct pressure  Wound exploration: wound explored through full range of motion and entire depth of wound probed and visualized     Wound extent: underlying fracture     Wound extent: no foreign bodies/material noted     Contaminated: yes   Treatment:    Area cleansed with:  Betadine and saline   Amount of cleaning:  Extensive   Irrigation solution:  Sterile saline   Irrigation method:  Syringe   Visualized foreign bodies/material removed: no   Skin repair:    Repair method:  Sutures   Suture size:  6-0   Suture material:  Prolene   Suture technique:  Simple interrupted Approximation:    Approximation:  Close Post-procedure details:    Dressing:  Antibiotic ointment   Patient tolerance of procedure:  Tolerated well, no immediate complications .Marland KitchenLaceration Repair Date/Time: 10/15/2018 11:45 PM Performed by: Renita Papa, PA-C Authorized by: Renita Papa, PA-C   Consent:    Consent  obtained:  Verbal   Consent given by:  Patient   Risks discussed:  Infection, need for additional repair, pain, poor cosmetic result and poor wound healing   Alternatives discussed:  No treatment and delayed treatment Universal protocol:    Procedure explained and questions answered to patient or proxy's satisfaction: yes     Relevant documents present and verified: yes     Test results available and properly labeled: yes     Imaging studies available: yes     Required blood products, implants, devices, and special equipment available: yes     Site/side marked: yes     Immediately prior to procedure, a time out was called: yes     Patient identity confirmed:  Verbally with patient Anesthesia (see MAR for exact dosages):    Anesthesia method:  Local infiltration   Local anesthetic:  Lidocaine 2% WITH epi Laceration details:    Location:  Face   Face location:  Nose   Length (cm):  5   Depth (mm):  3 Repair type:    Repair type:  Simple Pre-procedure details:    Preparation:  Patient was prepped and draped in usual sterile fashion and imaging obtained to evaluate for foreign bodies Exploration:    Hemostasis achieved with:  Direct pressure   Wound extent: underlying fracture   Treatment:    Area cleansed with:  Betadine and saline   Amount of cleaning:  Extensive   Irrigation solution:  Sterile saline   Irrigation method:  Syringe Skin repair:    Repair method:  Sutures   Suture size:  6-0   Suture material:  Prolene   Suture technique:  Simple interrupted   Number of sutures:  9 Approximation:    Approximation:  Close Post-procedure details:    Dressing:  Antibiotic ointment   Patient tolerance of procedure:  Tolerated well, no immediate complications .Critical Care Performed by: Renita Papa, PA-C Authorized by: Renita Papa, PA-C   Critical care provider statement:    Critical care time (minutes):  40   Critical care was necessary to treat or prevent imminent or  life-threatening deterioration of the following conditions:  CNS failure or compromise   Critical care was time spent personally by me on the following activities:  Discussions with consultants, evaluation of patient's response to treatment, examination of patient, ordering and performing treatments and interventions, ordering and review of laboratory studies, ordering and review of radiographic studies, pulse oximetry, re-evaluation of patient's condition, obtaining history from patient or surrogate and review  of old charts   I assumed direction of critical care for this patient from another provider in my specialty: no     (including critical care time)  Medications Ordered in ED Medications  lidocaine-EPINEPHrine (XYLOCAINE W/EPI) 2 %-1:200000 (PF) injection 10 mL ( Infiltration Canceled Entry 10/16/18 0042)  bisoprolol (ZEBETA) tablet 10 mg (has no administration in time range)  tamsulosin (FLOMAX) capsule 0.4 mg (has no administration in time range)  loratadine (CLARITIN) tablet 10 mg (has no administration in time range)  oxyCODONE-acetaminophen (PERCOCET/ROXICET) 5-325 MG per tablet 1 tablet (1 tablet Oral Given 10/16/18 0039)  morphine 2 MG/ML injection 0.5 mg (has no administration in time range)  cholecalciferol (VITAMIN D3) tablet 2,000 Units (has no administration in time range)  sodium chloride flush (NS) 0.9 % injection 3 mL (0 mLs Intravenous Hold 10/15/18 2204)  acetaminophen (TYLENOL) tablet 650 mg (has no administration in time range)    Or  acetaminophen (TYLENOL) suppository 650 mg (has no administration in time range)  ondansetron (ZOFRAN) tablet 4 mg (has no administration in time range)    Or  ondansetron (ZOFRAN) injection 4 mg (has no administration in time range)  senna-docusate (Senokot-S) tablet 1 tablet (has no administration in time range)  hydrALAZINE (APRESOLINE) injection 5 mg (has no administration in time range)  0.9 %  sodium chloride infusion ( Intravenous  Transfusing/Transfer 10/15/18 2254)  ampicillin-sulbactam (UNASYN) 1.5 g in sodium chloride 0.9 % 100 mL IVPB (has no administration in time range)  simvastatin (ZOCOR) tablet 20 mg (has no administration in time range)  zolpidem (AMBIEN) tablet 5 mg (has no administration in time range)  Tdap (BOOSTRIX) injection 0.5 mL (0.5 mLs Intramuscular Given 10/15/18 1830)  iohexol (OMNIPAQUE) 350 MG/ML injection 75 mL (75 mLs Intravenous Contrast Given 10/15/18 1928)  ampicillin-sulbactam (UNASYN) 1.5 g in sodium chloride 0.9 % 100 mL IVPB (0 g Intravenous Stopped 10/15/18 2150)  HYDROmorphone (DILAUDID) injection 0.25 mg (0.25 mg Intravenous Given 10/15/18 2057)     Initial Impression / Assessment and Plan / ED Course  I have reviewed the triage vital signs and the nursing notes.  Pertinent labs & imaging results that were available during my care of the patient were reviewed by me and considered in my medical decision making (see chart for details).        Patient presents for evaluation after syncopal episode.  This was preceded by chest tightness and lightheadedness.  He is afebrile, vital signs stable on my assessment.  He has significant injuries to the face.  Also notes paresthesia to the hands bilaterally.  No weakness on examination.  With history of recent international travel and CLL, will obtain PE study to rule out PE versus dissection.  We will also obtain lab work, EKG, troponin.  Tetanus updated.  No signs of significant intra-abdominal injury.  Lab work reviewed by me essentially at patient's baseline with chronic leukocytosis and thrombocytopenia.  He has an elevated creatinine and BUN although most recent BMP to compare to is from 2 years ago and appears similar.  Initial troponin is negative and EKG shows normal sinus rhythm, no acute ischemic changes.  Radiologist called to speak with Dr. Tyrone Nine, patient's imaging significant for trace posttraumatic subarachnoid hemorrhage, several  facial fractures including a possible right mandibular fracture although the patient has absolutely no tenderness on palpation of the mandible with no jaw malocclusion.  No acute fractures of the cervical spine but he does have advanced cervical spine degeneration with evidence of multifactorial  spinal stenosis at C4-5 and C5-6. 8:14 PM Spoke with Dr. Arnoldo Morale with neurosurgery regarding the patient's posttraumatic subarachnoid hemorrhage and bilateral upper extremity paresthesias.  He has reviewed the images independently and feels that the subarachnoid hemorrhage is nonoperative and likely will not require admission.  With regards to the paresthesias, he does recommend an MRI of the cervical spine which can happen on an outpatient basis if the patient is discharged and does recommend follow-up in his office via telehealth given current COVID-19 recommendations.  8:29PM Spoke with Dr. Wilburn Cornelia with ENT who recommends antibiotics, sinus precautions.  Patient's facial fractures do not require immediate intervention but will require follow-up on an outpatient basis in the next 5 to 7 days for reevaluation and discussion of possible surgical repair.  He will receive Unasyn in the ED but can be weaned to p.o. Augmentin.  CTA of the chest shows no evidence of PE or dissection.  He has a stable lung nodule compatible with benign etiology.  Also has findings consistent with his history of active CLL.  Pain managed while in the ED.  His wounds were extensively irrigated and repaired in the ED with nonabsorbable sutures which he tolerated without difficulty.  Given exertional chest pain, concern for possible cardiopulmonary etiology of his syncope.  Patient would benefit from admission for cardiac work-up.  Spoke with Dr. Blaine Hamper with [facility] hospitalist service who agrees to assume care of patient and bring him into the hospital for further evaluation and management.  I spoke with the patient's daughter who is an  Administrator, [employer] in [place], informed of patient's work-up and disposition.  Seen and evaluated by Dr. Tyrone Nine who agrees with assessment and plan at this time.   Final Clinical Impressions(s) / ED Diagnoses   Final diagnoses:  Traumatic subarachnoid hemorrhage with loss of consciousness of 30 minutes or less, initial encounter ([place])  Syncope and collapse  Multiple open fractures of facial bones, initial encounter [facility])    ED Discharge Orders    None         Renita Papa, PA-C 10/16/18 [place], [place], DO 10/16/18 1516

## 2018-10-15 NOTE — ED Notes (Signed)
ED TO INPATIENT HANDOFF REPORT  ED Nurse Name and Phone #: Lexine Baton [phone]  S Name/Age/Gender [name] 78 y.o. male Room/Bed: 035C/035C  Code Status   Code Status: Full Code  Home/SNF/Other Home Pt oriented x4 Is this baseline? yes  Triage Complete: Triage complete  Chief Complaint syncope  Triage Note Pt from home via ems; out walking today, became lightheaded, began experiencing chest tightness, sob, and had syncopal episode; laceration to nose and forehead; no pain, pt not on thinners; EKG unremarkable with ems; pt c/o numbness/tingling in both hands  138/70 HR 68 RR 16 97% RA CBG 95 Temp 98.22F    Allergies No Known Allergies  Level of Care/Admitting Diagnosis ED Disposition    ED Disposition Condition [place]: [place] [[id number]]  Level of Care: Telemetry Medical [104]  I expect the patient will be discharged within 24 hours: No (not a candidate for 5C-Observation unit)  Covid Evaluation: N/A  Diagnosis: Syncope [206001]  Admitting Physician: Ivor Costa [[id number]]  Attending Physician: Ivor Costa [[id number]]  PT Class (Do Not Modify): Observation [104]  PT Acc Code (Do Not Modify): Observation [10022]       B Medical/Surgery History Past Medical History:  Diagnosis Date  . Arthritis   . Carpal tunnel syndrome, bilateral 10/09/2017  . GERD (gastroesophageal reflux disease)   . Hepatitis 1966   Drug reaction after taking medication  . Hyperlipemia   . Hypertension   . Lung nodule seen on imaging study    bilateral lungs   Past Surgical History:  Procedure Laterality Date  . APPENDECTOMY    . arthroscoyp  2000   left knee  . COLONOSCOPY W/ POLYPECTOMY    . LUMBAR LAMINECTOMY/DECOMPRESSION MICRODISCECTOMY  06/27/2012   Procedure: LUMBAR LAMINECTOMY/DECOMPRESSION MICRODISCECTOMY 1 LEVEL;  Surgeon: [name], MD;  Location: [facility];  Service: Neurosurgery;  Laterality: Bilateral;  bilateral four-five  laminectony  . SKIN CANCER EXCISION  5-6 years ago   moe-s surgery- basal b  . TONSILLECTOMY       A IV Location/Drains/Wounds Patient Lines/Drains/Airways Status   Active Line/Drains/Airways    Name:   Placement date:   Placement time:   Site:   Days:   Peripheral IV 10/15/18 Left Hand   10/15/18    -    Hand   less than 1   Peripheral IV 10/15/18 Right Antecubital   10/15/18    1827    Antecubital   less than 1   External Urinary Catheter   08/17/16    0100    -   789          Intake/Output Last 24 hours  Intake/Output Summary (Last 24 hours) at 10/15/2018 2254 Last data filed at 10/15/2018 2150 Gross per 24 hour  Intake 100 ml  Output -  Net 100 ml    Labs/Imaging Results for orders placed or performed during the hospital encounter of 10/15/18 (from the past 48 hour(s))  Basic metabolic panel     Status: Abnormal   Collection Time: 10/15/18  6:06 PM  Result Value Ref Range   Sodium 139 135 - 145 mmol/L   Potassium 3.5 3.5 - 5.1 mmol/L   Chloride 104 98 - 111 mmol/L   CO2 21 (L) 22 - 32 mmol/L   Glucose, Bld 101 (H) 70 - 99 mg/dL   BUN 28 (H) 8 - 23 mg/dL   Creatinine, Ser 1.84 (H) 0.61 - 1.24 mg/dL  Calcium 9.2 8.9 - 10.3 mg/dL   GFR calc non Af Amer 35 (L) >60 mL/min   GFR calc Af Amer 40 (L) >60 mL/min   Anion gap 14 5 - 15    Comment: Performed at [facility] [address] [place], [place] [address]  CBC with Differential     Status: Abnormal   Collection Time: 10/15/18  6:06 PM  Result Value Ref Range   WBC 47.3 (H) 4.0 - 10.5 K/uL   RBC 4.19 (L) 4.22 - 5.81 MIL/uL   Hemoglobin 12.5 (L) 13.0 - 17.0 g/dL   HCT 39.1 39.0 - 52.0 %   MCV 93.3 80.0 - 100.0 fL   MCH 29.8 26.0 - 34.0 pg   MCHC 32.0 30.0 - 36.0 g/dL   RDW 13.4 11.5 - 15.5 %   Platelets 36 (L) 150 - 400 K/uL    Comment: REPEATED TO VERIFY PLATELET COUNT CONFIRMED BY SMEAR SPECIMEN CHECKED FOR CLOTS Immature Platelet Fraction may be clinically indicated, consider ordering this  additional test [id number] CONSISTENT WITH PREVIOUS RESULT    nRBC 0.0 0.0 - 0.2 %   Neutrophils Relative % 35 %   Neutro Abs 16.6 (H) 1.7 - 7.7 K/uL   Lymphocytes Relative 60 %   Lymphs Abs 28.4 (H) 0.7 - 4.0 K/uL   Monocytes Relative 3 %   Monocytes Absolute 1.4 (H) 0.1 - 1.0 K/uL   Eosinophils Relative 1 %   Eosinophils Absolute 0.5 0.0 - 0.5 K/uL   Basophils Relative 1 %   Basophils Absolute 0.5 (H) 0.0 - 0.1 K/uL   WBC Morphology ATYPICAL LYMPHOCYTES    Abs Immature Granulocytes 0.00 0.00 - 0.07 K/uL    Comment: Performed at [facility], [place] [address], [place], [place] [address]  Troponin I - Once     Status: None   Collection Time: 10/15/18  6:06 PM  Result Value Ref Range   Troponin I <0.03 <0.03 ng/mL    Comment: Performed at [facility] [address], [place], [place] [address]  I-stat Creatinine, ED     Status: Abnormal   Collection Time: 10/15/18  6:15 PM  Result Value Ref Range   Creatinine, Ser 1.70 (H) 0.61 - 1.24 mg/dL  Urinalysis, Routine w reflex microscopic     Status: Abnormal   Collection Time: 10/15/18  7:46 PM  Result Value Ref Range   Color, Urine YELLOW YELLOW   APPearance CLEAR CLEAR   Specific Gravity, Urine 1.016 1.005 - 1.030   pH 6.0 5.0 - 8.0   Glucose, UA NEGATIVE NEGATIVE mg/dL   Hgb urine dipstick SMALL (A) NEGATIVE   Bilirubin Urine NEGATIVE NEGATIVE   Ketones, ur 5 (A) NEGATIVE mg/dL   Protein, ur NEGATIVE NEGATIVE mg/dL   Nitrite NEGATIVE NEGATIVE   Leukocytes,Ua NEGATIVE NEGATIVE   RBC / HPF 0-5 0 - 5 RBC/hpf   WBC, UA 0-5 0 - 5 WBC/hpf   Bacteria, UA NONE SEEN NONE SEEN   Mucus PRESENT    Hyaline Casts, UA PRESENT     Comment: Performed at [facility], [address], [place], [place] [address]   Ct Head Wo Contrast  Result Date: 10/15/2018 CLINICAL DATA:  78 year old male status post syncope and blunt trauma. Nose and forehead lacerations. EXAM: CT HEAD WITHOUT CONTRAST CT MAXILLOFACIAL WITHOUT CONTRAST CT  CERVICAL SPINE WITHOUT CONTRAST TECHNIQUE: Multidetector CT imaging of the head, cervical spine, and maxillofacial structures were performed using the standard  protocol without intravenous contrast. Multiplanar CT image reconstructions of the cervical spine and maxillofacial structures were also generated. COMPARISON:  Paranasal sinus CT 04/06/2011. FINDINGS: CT HEAD FINDINGS Brain: Trace subarachnoid hemorrhage along the medial aspect of the right superior frontal gyrus on series 3, image 20 and series 5, image 21. No subdural blood or other extra-axial hemorrhage identified. No intraventricular hemorrhage. No parenchymal hemorrhagic contusion identified. Partially calcified left frontoparietal convexity 11-13 millimeter meningioma suspected (series 3, image 26 and coronal image 52). No significant mass effect. No cerebral edema. Gray-white matter differentiation is within normal limits for age. No ventriculomegaly or intracranial mass effect. No cortically based acute infarct identified. Vascular: Calcified atherosclerosis at the skull base. No suspicious intracranial vascular hyperdensity. Skull: Calvarium appears intact. See facial bone findings below. Other: Forehead scalp hematoma tracks cephalad. See facial bone findings below. Other scalp soft tissues appear negative. CT MAXILLOFACIAL FINDINGS Osseous: Comminuted and impacted bilateral anterior nasal bone fractures with associated nasal bridge soft tissue swelling and soft tissue gas. The maxillary nasal processes remain intact. However, there is mild associated deformity of the anterior superior nasal septum. There is also abnormal soft tissue gas in and around the right medial frontoethmoidal confluence, with a possible nondisplaced fracture there on series 8, image 64. Otherwise the anterior walls of the frontal sinuses appear intact. Nondisplaced fracture versus nutrient foramen of the right mandible in the parasymphyseal region best seen on series 13,  image 42. Elsewhere the mandible appears intact and normally aligned, but there is superimposed poor dentition. No maxilla or zygoma fracture. Poor maxillary dentition posteriorly. Central skull base intact. Pterygoid plates intact. Orbits: Trace gas in the anterior superior right orbit suspicious for nondisplaced lamina papyracea fracture there on series 8, image 70 and series 12, image 33. Other orbital walls are intact. Globes are intact. There is preseptal soft tissue swelling/hematoma, but no intraorbital hematoma. No other intraorbital gas. Sinuses: Trace hemorrhage layering in the right frontal sinus (series 6, image 68) associated with the nondisplaced frontoethmoidal fractures described above. Mild right anterior ethmoid bladder mucosal thickening. Minimal to mild paranasal sinus mucosal thickening elsewhere. Small volume retained secretions in the nasal cavity. Leftward anterior nasal septal deviation may be acute and associated with the nasal bone fractures described earlier. Negative nasal cavity otherwise. Tympanic cavities and mastoids are clear. Soft tissues: Negative visible noncontrast larynx, pharynx, parapharyngeal spaces, retropharyngeal space, sublingual space, submandibular and parotid glands. There is moderate bilateral level 1, level 2, and visible level 3 lymphadenopathy with enlarged individual nodes up to 17 millimeters short axis. CT CERVICAL SPINE FINDINGS Alignment: Mild reversal of cervical lordosis. Cervicothoracic junction alignment is within normal limits. Bilateral posterior element alignment is within normal limits. Skull base and vertebrae: Visualized skull base is intact. No atlanto-occipital dissociation. No acute osseous abnormality identified. Soft tissues and spinal canal: No prevertebral fluid or swelling. No visible canal hematoma. Generalized bilateral cervical lymphadenopathy continuing to the thoracic inlet (series 15, image 64). Disc levels: Congenital incomplete  segmentation versus acquired ankylosis of C6-C7. Advanced cervical disc and endplate degeneration elsewhere. At least mild multifactorial spinal stenosis at C4-C5 and C5-C6. Upper chest: Negative lung apices. Grossly intact visible upper thoracic levels. IMPRESSION: 1. Positive for trace posttraumatic subarachnoid hemorrhage along the medial aspect of the right superior frontal gyrus. No other acute traumatic injury to the brain identified. 2. Positive for generalized cervical Lymphadenopathy continuing to the thoracic inlet. On discussion with Dr. Tyrone Nine in the ED he advises the patient has chronic lymphocytic leukemia (  CLL) which explains this appearance. 3. Comminuted and impacted bilateral nasal bone fractures. Probable associated fracture of the anterior superior nasal septum. Nondisplaced fracture of the anterior right frontoethmoidal confluence suspected, with mild regional soft tissue gas. Nondisplaced fracture of the right anterior lamina papyracea with trace gas in the anterior superior right orbit. No intraorbital hematoma. 4. Probable nutrient foramen of the right mandible parasymphysis, less likely a nondisplaced fracture (series 13, image 42). 5. No acute fracture in the cervical spine. 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 Study discussed by telephone with Dr. Tyrone Nine on 10/15/2018 at 19:50 . Electronically Signed   By: Genevie Ann M.D.   On: 10/15/2018 19:56   Ct Angio Chest Pe W And/or Wo Contrast  Result Date: 10/15/2018 CLINICAL DATA:  78 year old male status post syncope and blunt trauma. Chest tightness, shortness of breath. CLL. EXAM: CT ANGIOGRAPHY CHEST WITH CONTRAST TECHNIQUE: Multidetector CT imaging of the chest was performed using the standard protocol during bolus administration of intravenous contrast. Multiplanar CT image reconstructions and MIPs were obtained to evaluate the vascular anatomy. CONTRAST:  23mL OMNIPAQUE  IOHEXOL 350 MG/ML SOLN COMPARISON:  Head face and cervical spine CT today reported separately. Chest radiographs 08/17/2016. Chest CT 03/16/2009. FINDINGS: Cardiovascular: Good contrast bolus timing in the pulmonary arterial tree. Mild respiratory motion. No focal filling defect identified in the pulmonary arteries to suggest acute pulmonary embolism. Negative visible aorta aside from mild atherosclerosis. Mild cardiomegaly. No pericardial effusion. Mediastinum/Nodes: Lower cervical lymphadenopathy continuing to the thoracic inlet as seen on the face and cervical spine imaging today. There is mild to moderate mediastinal and hilar lymphadenopathy, new since 2010. Individual nodes measure up to 12 millimeter short axis. Lungs/Pleura: Major airways are patent. No pneumothorax or pleural effusion. No pulmonary contusion. Mild chronic scarring in the lingula and medial segment of the right middle lobe. There is a round 10-11 millimeter anterior basal segment left lower lobe lung nodule which was 7 millimeters in 2010. This is most likely benign. Upper Abdomen: Partially visible splenomegaly, new since 2010. Negative visible liver and bowel in the upper abdomen. Musculoskeletal: No rib or sternal fracture identified. Thoracic spine degeneration. No acute osseous abnormality identified. Review of the MIP images confirms the above findings. IMPRESSION: 1. No evidence of acute pulmonary embolus. No acute traumatic injury identified in the chest. 2. History of CLL with evidence of active disease: splenomegaly, cervical and mediastinal lymphadenopathy. 3. No acute pulmonary abnormality. A solitary lung nodule in the left lower lobe has minimally increased from 7 to 11 mm over the past 10 years compatible with benign etiology. 4. Mild cardiomegaly. Electronically Signed   By: Genevie Ann M.D.   On: 10/15/2018 20:01   Ct Cervical Spine Wo Contrast  Result Date: 10/15/2018 CLINICAL DATA:  78 year old male status post syncope and  blunt trauma. Nose and forehead lacerations. EXAM: CT HEAD WITHOUT CONTRAST CT MAXILLOFACIAL WITHOUT CONTRAST CT CERVICAL SPINE WITHOUT CONTRAST TECHNIQUE: Multidetector CT imaging of the head, cervical spine, and maxillofacial structures were performed using the standard protocol without intravenous contrast. Multiplanar CT image reconstructions of the cervical spine and maxillofacial structures were also generated. COMPARISON:  Paranasal sinus CT 04/06/2011. FINDINGS: CT HEAD FINDINGS Brain: Trace subarachnoid hemorrhage along the medial aspect of the right superior frontal gyrus on series 3, image 20 and series 5, image 21. No subdural blood or other extra-axial hemorrhage identified. No intraventricular hemorrhage. No parenchymal hemorrhagic contusion identified. Partially calcified left frontoparietal convexity  11-13 millimeter meningioma suspected (series 3, image 26 and coronal image 52). No significant mass effect. No cerebral edema. Gray-white matter differentiation is within normal limits for age. No ventriculomegaly or intracranial mass effect. No cortically based acute infarct identified. Vascular: Calcified atherosclerosis at the skull base. No suspicious intracranial vascular hyperdensity. Skull: Calvarium appears intact. See facial bone findings below. Other: Forehead scalp hematoma tracks cephalad. See facial bone findings below. Other scalp soft tissues appear negative. CT MAXILLOFACIAL FINDINGS Osseous: Comminuted and impacted bilateral anterior nasal bone fractures with associated nasal bridge soft tissue swelling and soft tissue gas. The maxillary nasal processes remain intact. However, there is mild associated deformity of the anterior superior nasal septum. There is also abnormal soft tissue gas in and around the right medial frontoethmoidal confluence, with a possible nondisplaced fracture there on series 8, image 64. Otherwise the anterior walls of the frontal sinuses appear intact.  Nondisplaced fracture versus nutrient foramen of the right mandible in the parasymphyseal region best seen on series 13, image 42. Elsewhere the mandible appears intact and normally aligned, but there is superimposed poor dentition. No maxilla or zygoma fracture. Poor maxillary dentition posteriorly. Central skull base intact. Pterygoid plates intact. Orbits: Trace gas in the anterior superior right orbit suspicious for nondisplaced lamina papyracea fracture there on series 8, image 70 and series 12, image 33. Other orbital walls are intact. Globes are intact. There is preseptal soft tissue swelling/hematoma, but no intraorbital hematoma. No other intraorbital gas. Sinuses: Trace hemorrhage layering in the right frontal sinus (series 6, image 68) associated with the nondisplaced frontoethmoidal fractures described above. Mild right anterior ethmoid bladder mucosal thickening. Minimal to mild paranasal sinus mucosal thickening elsewhere. Small volume retained secretions in the nasal cavity. Leftward anterior nasal septal deviation may be acute and associated with the nasal bone fractures described earlier. Negative nasal cavity otherwise. Tympanic cavities and mastoids are clear. Soft tissues: Negative visible noncontrast larynx, pharynx, parapharyngeal spaces, retropharyngeal space, sublingual space, submandibular and parotid glands. There is moderate bilateral level 1, level 2, and visible level 3 lymphadenopathy with enlarged individual nodes up to 17 millimeters short axis. CT CERVICAL SPINE FINDINGS Alignment: Mild reversal of cervical lordosis. Cervicothoracic junction alignment is within normal limits. Bilateral posterior element alignment is within normal limits. Skull base and vertebrae: Visualized skull base is intact. No atlanto-occipital dissociation. No acute osseous abnormality identified. Soft tissues and spinal canal: No prevertebral fluid or swelling. No visible canal hematoma. Generalized bilateral  cervical lymphadenopathy continuing to the thoracic inlet (series 15, image 64). Disc levels: Congenital incomplete segmentation versus acquired ankylosis of C6-C7. Advanced cervical disc and endplate degeneration elsewhere. At least mild multifactorial spinal stenosis at C4-C5 and C5-C6. Upper chest: Negative lung apices. Grossly intact visible upper thoracic levels. IMPRESSION: 1. Positive for trace posttraumatic subarachnoid hemorrhage along the medial aspect of the right superior frontal gyrus. No other acute traumatic injury to the brain identified. 2. Positive for generalized cervical Lymphadenopathy continuing to the thoracic inlet. On discussion with Dr. Tyrone Nine in the ED he advises the patient has chronic lymphocytic leukemia (CLL) which explains this appearance. 3. Comminuted and impacted bilateral nasal bone fractures. Probable associated fracture of the anterior superior nasal septum. Nondisplaced fracture of the anterior right frontoethmoidal confluence suspected, with mild regional soft tissue gas. Nondisplaced fracture of the right anterior lamina papyracea with trace gas in the anterior superior right orbit. No intraorbital hematoma. 4. Probable nutrient foramen of the right mandible parasymphysis, less likely a nondisplaced fracture (series 13,  image 42). 5. No acute fracture in the cervical spine. 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 Study discussed by telephone with Dr. Tyrone Nine on 10/15/2018 at 19:50 . Electronically Signed   By: Genevie Ann M.D.   On: 10/15/2018 19:56   Ct Maxillofacial Wo Cm  Result Date: 10/15/2018 CLINICAL DATA:  78 year old male status post syncope and blunt trauma. Nose and forehead lacerations. EXAM: CT HEAD WITHOUT CONTRAST CT MAXILLOFACIAL WITHOUT CONTRAST CT CERVICAL SPINE WITHOUT CONTRAST TECHNIQUE: Multidetector CT imaging of the head, cervical spine, and maxillofacial structures were performed using the  standard protocol without intravenous contrast. Multiplanar CT image reconstructions of the cervical spine and maxillofacial structures were also generated. COMPARISON:  Paranasal sinus CT 04/06/2011. FINDINGS: CT HEAD FINDINGS Brain: Trace subarachnoid hemorrhage along the medial aspect of the right superior frontal gyrus on series 3, image 20 and series 5, image 21. No subdural blood or other extra-axial hemorrhage identified. No intraventricular hemorrhage. No parenchymal hemorrhagic contusion identified. Partially calcified left frontoparietal convexity 11-13 millimeter meningioma suspected (series 3, image 26 and coronal image 52). No significant mass effect. No cerebral edema. Gray-white matter differentiation is within normal limits for age. No ventriculomegaly or intracranial mass effect. No cortically based acute infarct identified. Vascular: Calcified atherosclerosis at the skull base. No suspicious intracranial vascular hyperdensity. Skull: Calvarium appears intact. See facial bone findings below. Other: Forehead scalp hematoma tracks cephalad. See facial bone findings below. Other scalp soft tissues appear negative. CT MAXILLOFACIAL FINDINGS Osseous: Comminuted and impacted bilateral anterior nasal bone fractures with associated nasal bridge soft tissue swelling and soft tissue gas. The maxillary nasal processes remain intact. However, there is mild associated deformity of the anterior superior nasal septum. There is also abnormal soft tissue gas in and around the right medial frontoethmoidal confluence, with a possible nondisplaced fracture there on series 8, image 64. Otherwise the anterior walls of the frontal sinuses appear intact. Nondisplaced fracture versus nutrient foramen of the right mandible in the parasymphyseal region best seen on series 13, image 42. Elsewhere the mandible appears intact and normally aligned, but there is superimposed poor dentition. No maxilla or zygoma fracture. Poor  maxillary dentition posteriorly. Central skull base intact. Pterygoid plates intact. Orbits: Trace gas in the anterior superior right orbit suspicious for nondisplaced lamina papyracea fracture there on series 8, image 70 and series 12, image 33. Other orbital walls are intact. Globes are intact. There is preseptal soft tissue swelling/hematoma, but no intraorbital hematoma. No other intraorbital gas. Sinuses: Trace hemorrhage layering in the right frontal sinus (series 6, image 68) associated with the nondisplaced frontoethmoidal fractures described above. Mild right anterior ethmoid bladder mucosal thickening. Minimal to mild paranasal sinus mucosal thickening elsewhere. Small volume retained secretions in the nasal cavity. Leftward anterior nasal septal deviation may be acute and associated with the nasal bone fractures described earlier. Negative nasal cavity otherwise. Tympanic cavities and mastoids are clear. Soft tissues: Negative visible noncontrast larynx, pharynx, parapharyngeal spaces, retropharyngeal space, sublingual space, submandibular and parotid glands. There is moderate bilateral level 1, level 2, and visible level 3 lymphadenopathy with enlarged individual nodes up to 17 millimeters short axis. CT CERVICAL SPINE FINDINGS Alignment: Mild reversal of cervical lordosis. Cervicothoracic junction alignment is within normal limits. Bilateral posterior element alignment is within normal limits. Skull base and vertebrae: Visualized skull base is intact. No atlanto-occipital dissociation. No acute osseous abnormality identified. Soft tissues and spinal canal: No prevertebral fluid or swelling. No visible canal  hematoma. Generalized bilateral cervical lymphadenopathy continuing to the thoracic inlet (series 15, image 64). Disc levels: Congenital incomplete segmentation versus acquired ankylosis of C6-C7. Advanced cervical disc and endplate degeneration elsewhere. At least mild multifactorial spinal stenosis  at C4-C5 and C5-C6. Upper chest: Negative lung apices. Grossly intact visible upper thoracic levels. IMPRESSION: 1. Positive for trace posttraumatic subarachnoid hemorrhage along the medial aspect of the right superior frontal gyrus. No other acute traumatic injury to the brain identified. 2. Positive for generalized cervical Lymphadenopathy continuing to the thoracic inlet. On discussion with Dr. Tyrone Nine in the ED he advises the patient has chronic lymphocytic leukemia (CLL) which explains this appearance. 3. Comminuted and impacted bilateral nasal bone fractures. Probable associated fracture of the anterior superior nasal septum. Nondisplaced fracture of the anterior right frontoethmoidal confluence suspected, with mild regional soft tissue gas. Nondisplaced fracture of the right anterior lamina papyracea with trace gas in the anterior superior right orbit. No intraorbital hematoma. 4. Probable nutrient foramen of the right mandible parasymphysis, less likely a nondisplaced fracture (series 13, image 42). 5. No acute fracture in the cervical spine. 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 Study discussed by telephone with Dr. Tyrone Nine on 10/15/2018 at 19:50 . Electronically Signed   By: Genevie Ann M.D.   On: 10/15/2018 19:56    Pending Labs Unresulted Labs (From admission, onward)    Start     Ordered   10/16/18 0500  Hemoglobin A1c  Tomorrow morning,   R     10/15/18 2159   10/16/18 0500  Lipid panel  Tomorrow morning,   R    Comments:  Please obtain as a fasting lipid panel - should not have eaten/ drank food for 8 hours prior to labs.    10/15/18 2159   10/16/18 3154  Basic metabolic panel  Tomorrow morning,   R     10/15/18 2201   10/16/18 0500  CBC  Tomorrow morning,   R     10/15/18 2201   10/15/18 2159  Troponin I - Now Then Q6H  Now then every 6 hours,   R     10/15/18 2159          Vitals/Pain Today's Vitals   10/15/18 2122  10/15/18 2130 10/15/18 2145 10/15/18 2200  BP:  137/69 (!) 148/82 133/60  Pulse:  (!) 59 60 (!) 56  Resp:  13 13 14   Temp:      TempSrc:      SpO2:  100% 100% 100%  Weight:      Height:      PainSc: 0-No pain       Isolation Precautions No active isolations  Medications Medications  lidocaine-EPINEPHrine (XYLOCAINE W/EPI) 2 %-1:200000 (PF) injection 10 mL (has no administration in time range)  bisoprolol (ZEBETA) tablet 10 mg (has no administration in time range)  tamsulosin (FLOMAX) capsule 0.4 mg (has no administration in time range)  loratadine (CLARITIN) tablet 10 mg (has no administration in time range)  oxyCODONE-acetaminophen (PERCOCET/ROXICET) 5-325 MG per tablet 1 tablet (has no administration in time range)  morphine 2 MG/ML injection 0.5 mg (has no administration in time range)  cholecalciferol (VITAMIN D3) tablet 2,000 Units (has no administration in time range)  sodium chloride flush (NS) 0.9 % injection 3 mL (0 mLs Intravenous Hold 10/15/18 2204)  acetaminophen (TYLENOL) tablet 650 mg (has no administration in time range)    Or  acetaminophen (TYLENOL) suppository 650 mg (has no  administration in time range)  ondansetron (ZOFRAN) tablet 4 mg (has no administration in time range)    Or  ondansetron (ZOFRAN) injection 4 mg (has no administration in time range)  senna-docusate (Senokot-S) tablet 1 tablet (has no administration in time range)  hydrALAZINE (APRESOLINE) injection 5 mg (has no administration in time range)  0.9 %  sodium chloride infusion ( Intravenous Transfusing/Transfer 10/15/18 2254)  ampicillin-sulbactam (UNASYN) 1.5 g in sodium chloride 0.9 % 100 mL IVPB (has no administration in time range)  simvastatin (ZOCOR) tablet 20 mg (has no administration in time range)  Tdap (BOOSTRIX) injection 0.5 mL (0.5 mLs Intramuscular Given 10/15/18 1830)  iohexol (OMNIPAQUE) 350 MG/ML injection 75 mL (75 mLs Intravenous Contrast Given 10/15/18 1928)   ampicillin-sulbactam (UNASYN) 1.5 g in sodium chloride 0.9 % 100 mL IVPB (0 g Intravenous Stopped 10/15/18 2150)  HYDROmorphone (DILAUDID) injection 0.25 mg (0.25 mg Intravenous Given 10/15/18 2057)    Mobility Ambulates independently at baseline, HIGH FALL RISK     Focused Assessments Alert, oriented x4, subarachnoid hemmorhage, minor, CLL, C/o tingling numbness bilateral hands   R Recommendations: See Admitting Provider Note  Report given to:   Additional Notes: PT alert, oriented x4, new sutures in place, needs MRI brain, admit for syncope.

## 2018-10-15 NOTE — Progress Notes (Signed)
Pharmacy Antibiotic Note  [name] is a 78 y.o. male admitted on 10/15/2018 with nasal bone fracture.  Pharmacy has been consulted for Unasyn dosing. SCr 1.7 on admit. Received 1x dose in the ER.  Plan: Unasyn 1.5g IV q6h Monitor clinical progress, c/s, renal function F/u de-escalation plan/LOT Height: 5\' 3"  (160 cm) Weight: 164 lb (74.4 kg) IBW/kg (Calculated) : 56.9  Temp (24hrs), Avg:97.8 F (36.6 C), Min:97.8 F (36.6 C), Max:97.8 F (36.6 C)  Recent Labs  Lab 10/15/18 1806 10/15/18 1815  WBC 47.3*  --   CREATININE 1.84* 1.70*    Estimated Creatinine Clearance: 32.9 mL/min (A) (by C-G formula based on SCr of 1.7 mg/dL (H)).    No Known Allergies  Elicia Lamp, PharmD, BCPS Clinical Pharmacist Please check AMION for all [place] contact numbers 10/15/2018 10:18 PM

## 2018-10-16 ENCOUNTER — Observation Stay (HOSPITAL_COMMUNITY): Payer: Medicare Other

## 2018-10-16 ENCOUNTER — Inpatient Hospital Stay (HOSPITAL_COMMUNITY): Payer: Medicare Other

## 2018-10-16 DIAGNOSIS — R911 Solitary pulmonary nodule: Secondary | ICD-10-CM | POA: Diagnosis present

## 2018-10-16 DIAGNOSIS — D696 Thrombocytopenia, unspecified: Secondary | ICD-10-CM | POA: Diagnosis not present

## 2018-10-16 DIAGNOSIS — I2582 Chronic total occlusion of coronary artery: Secondary | ICD-10-CM | POA: Diagnosis present

## 2018-10-16 DIAGNOSIS — D649 Anemia, unspecified: Secondary | ICD-10-CM | POA: Diagnosis present

## 2018-10-16 DIAGNOSIS — R778 Other specified abnormalities of plasma proteins: Secondary | ICD-10-CM

## 2018-10-16 DIAGNOSIS — N179 Acute kidney failure, unspecified: Secondary | ICD-10-CM | POA: Diagnosis present

## 2018-10-16 DIAGNOSIS — N183 Chronic kidney disease, stage 3 (moderate): Secondary | ICD-10-CM | POA: Diagnosis present

## 2018-10-16 DIAGNOSIS — Y92008 Other place in unspecified non-institutional (private) residence as the place of occurrence of the external cause: Secondary | ICD-10-CM | POA: Diagnosis not present

## 2018-10-16 DIAGNOSIS — R55 Syncope and collapse: Secondary | ICD-10-CM

## 2018-10-16 DIAGNOSIS — R0789 Other chest pain: Secondary | ICD-10-CM

## 2018-10-16 DIAGNOSIS — I10 Essential (primary) hypertension: Secondary | ICD-10-CM | POA: Diagnosis not present

## 2018-10-16 DIAGNOSIS — S022XXB Fracture of nasal bones, initial encounter for open fracture: Secondary | ICD-10-CM | POA: Diagnosis present

## 2018-10-16 DIAGNOSIS — D693 Immune thrombocytopenic purpura: Secondary | ICD-10-CM | POA: Diagnosis present

## 2018-10-16 DIAGNOSIS — R7989 Other specified abnormal findings of blood chemistry: Secondary | ICD-10-CM

## 2018-10-16 DIAGNOSIS — R079 Chest pain, unspecified: Secondary | ICD-10-CM | POA: Diagnosis not present

## 2018-10-16 DIAGNOSIS — I200 Unstable angina: Secondary | ICD-10-CM | POA: Diagnosis not present

## 2018-10-16 DIAGNOSIS — S066X1A Traumatic subarachnoid hemorrhage with loss of consciousness of 30 minutes or less, initial encounter: Secondary | ICD-10-CM | POA: Diagnosis present

## 2018-10-16 DIAGNOSIS — Z79899 Other long term (current) drug therapy: Secondary | ICD-10-CM | POA: Diagnosis not present

## 2018-10-16 DIAGNOSIS — C9110 Chronic lymphocytic leukemia of B-cell type not having achieved remission: Secondary | ICD-10-CM | POA: Diagnosis present

## 2018-10-16 DIAGNOSIS — I2510 Atherosclerotic heart disease of native coronary artery without angina pectoris: Secondary | ICD-10-CM | POA: Diagnosis not present

## 2018-10-16 DIAGNOSIS — I129 Hypertensive chronic kidney disease with stage 1 through stage 4 chronic kidney disease, or unspecified chronic kidney disease: Secondary | ICD-10-CM | POA: Diagnosis present

## 2018-10-16 DIAGNOSIS — M47812 Spondylosis without myelopathy or radiculopathy, cervical region: Secondary | ICD-10-CM | POA: Diagnosis present

## 2018-10-16 DIAGNOSIS — E785 Hyperlipidemia, unspecified: Secondary | ICD-10-CM | POA: Diagnosis present

## 2018-10-16 DIAGNOSIS — I25110 Atherosclerotic heart disease of native coronary artery with unstable angina pectoris: Secondary | ICD-10-CM | POA: Diagnosis present

## 2018-10-16 DIAGNOSIS — Z23 Encounter for immunization: Secondary | ICD-10-CM | POA: Diagnosis present

## 2018-10-16 DIAGNOSIS — M4802 Spinal stenosis, cervical region: Secondary | ICD-10-CM | POA: Diagnosis present

## 2018-10-16 DIAGNOSIS — D320 Benign neoplasm of cerebral meninges: Secondary | ICD-10-CM | POA: Diagnosis present

## 2018-10-16 DIAGNOSIS — Y9301 Activity, walking, marching and hiking: Secondary | ICD-10-CM | POA: Diagnosis present

## 2018-10-16 DIAGNOSIS — W1839XA Other fall on same level, initial encounter: Secondary | ICD-10-CM | POA: Diagnosis present

## 2018-10-16 HISTORY — DX: Other specified abnormalities of plasma proteins: R77.8

## 2018-10-16 HISTORY — DX: Other specified abnormal findings of blood chemistry: R79.89

## 2018-10-16 LAB — CBC
HCT: 36.1 % — ABNORMAL LOW (ref 39.0–52.0)
Hemoglobin: 11.8 g/dL — ABNORMAL LOW (ref 13.0–17.0)
MCH: 30.3 pg (ref 26.0–34.0)
MCHC: 32.7 g/dL (ref 30.0–36.0)
MCV: 92.6 fL (ref 80.0–100.0)
Platelets: 29 10*3/uL — CL (ref 150–400)
RBC: 3.9 MIL/uL — ABNORMAL LOW (ref 4.22–5.81)
RDW: 13.5 % (ref 11.5–15.5)
WBC: 39.7 10*3/uL — ABNORMAL HIGH (ref 4.0–10.5)
nRBC: 0.1 % (ref 0.0–0.2)

## 2018-10-16 LAB — LIPID PANEL
Cholesterol: 106 mg/dL (ref 0–200)
HDL: 20 mg/dL — ABNORMAL LOW (ref >40)
LDL Cholesterol: 61 mg/dL (ref 0–99)
Total CHOL/HDL Ratio: 5.3 RATIO
Triglycerides: 125 mg/dL (ref <150)
VLDL: 25 mg/dL (ref 0–40)

## 2018-10-16 LAB — TROPONIN I
Troponin I: 0.07 ng/mL (ref <0.03)
Troponin I: 0.09 ng/mL (ref <0.03)
Troponin I: 0.1 ng/mL (ref <0.03)

## 2018-10-16 LAB — BASIC METABOLIC PANEL
Anion gap: 11 (ref 5–15)
BUN: 27 mg/dL — ABNORMAL HIGH (ref 8–23)
CO2: 20 mmol/L — ABNORMAL LOW (ref 22–32)
Calcium: 8.6 mg/dL — ABNORMAL LOW (ref 8.9–10.3)
Chloride: 107 mmol/L (ref 98–111)
Creatinine, Ser: 1.7 mg/dL — ABNORMAL HIGH (ref 0.61–1.24)
GFR calc Af Amer: 44 mL/min — ABNORMAL LOW (ref >60)
GFR calc non Af Amer: 38 mL/min — ABNORMAL LOW (ref >60)
Glucose, Bld: 94 mg/dL (ref 70–99)
Potassium: 3.8 mmol/L (ref 3.5–5.1)
Sodium: 138 mmol/L (ref 135–145)

## 2018-10-16 LAB — ECHOCARDIOGRAM LIMITED
Height: 63 in
Weight: 2603.19 oz

## 2018-10-16 LAB — HEMOGLOBIN A1C
Hgb A1c MFr Bld: 5.4 % (ref 4.8–5.6)
Mean Plasma Glucose: 108.28 mg/dL

## 2018-10-16 MED ORDER — NITROGLYCERIN 0.4 MG SL SUBL: 0.4000 mg | SUBLINGUAL_TABLET | SUBLINGUAL | Status: DC | PRN

## 2018-10-16 MED ORDER — DOUBLE ANTIBIOTIC 500-10000 UNIT/GM EX OINT
TOPICAL_OINTMENT | Freq: Two times a day (BID) | CUTANEOUS | Status: DC
  Administered 2018-10-16 – 2018-10-19 (×7): via TOPICAL
  Filled 2018-10-16: qty 28.4

## 2018-10-16 MED ORDER — ASPIRIN 81 MG PO CHEW: 81.0000 mg | CHEWABLE_TABLET | ORAL | Status: AC

## 2018-10-16 MED ORDER — ZOLPIDEM TARTRATE 5 MG PO TABS
5.0000 mg | ORAL_TABLET | Freq: Every evening | ORAL | Status: DC | PRN
  Administered 2018-10-17 – 2018-10-19 (×2): 5 mg via ORAL
  Filled 2018-10-16 (×2): qty 1

## 2018-10-16 NOTE — H&P (View-Only) (Signed)
Cardiology Consult    Patient ID: [name] MRN: [id number], DOB/AGE: Jul 09, 1940   Admit date: 10/15/2018 Date of Consult: 10/16/2018  Primary Physician: [name], MD Primary Cardiologist: Previously seen by Dr. [name] in 2014.  Requesting Provider: Ivor Costa, MD  Patient Profile    [name] is a 78 y.o. male with a history of moderate non-obstructive CAD on cardiac catheterization in 2015, hypertension, hyperlipidemia, GERD, CLL, and chronic thrombocytopenia, who is being seen today for the evaluation of elevated troponin at the request of Dr. Blaine Hamper.  History of Present Illness    Mr. Auguste is a 78 year old male with the above history. He had a cardiac catheterization in 09/2012 due to worsening dyspnea on exertion and was found to have 50-70% stenosis of the mid LAD with possible vaso-reactive small caliber posterior descending artery and posterior lateral artery. EF 60%. He was started on Imdur and a low-dose beta-blocker at that time.  Patient presented to the ED yesterday for evaluation of syncope preceded by chest tightness and shortness of breath. Was in [place] in early March and walking without any problem. Over past 3 weeks has been walking in his neighborhood for 15 mins at at time and began to develop chest pressure and arm discomfort, particularly when going up hills.  Patient reports he was walking in his neighborhood yesterday evening when he started to have some intermittent chest tightness which was more severe than usual. Patient states he had been walking for about 10-12 minutes and had to stop to catch his breath 2-3 times. The pain got to the point to which he was worried if he could make it home or not. Patient then passed out and fell hitting the front of his head in the process. Patient denies any other prodromal symptoms including palpitations, lightheadedness, and dizziness. Patient is unaware how long he was unconscious for but does not think it was very  long. Patient states he felt very dizzy after regaining consciousness and has some numbness/pain in his hands. Patient has noticed progressive chest tightness and dyspnea on exertion over the last month that quickly resoles with rest. No orthopnea, PND, or edema. No recent fevers, chills, body aches, respiratory symptoms, or known exposure to COVID-19.  In the ED, patient mildly bradycardic but vitals stable. EKG showed normal sinus rhythm with PVC. No ST-T wave abnormalities. No QT prolongation. No pre-excitation.  Initial troponin negative but repeats minimally elevated at 0.07 and 0.10. Chest CTA showed no evidence of pulmonary embolism. Head CT showed trace post-traumatic subarachnoid hemorrhage along the medial aspect of the right superior frontal gyrus and comminuted impacted bilateral nasal bone fractures. WBC 47.3, Hgb 12.5, Plts 36. Na 139, K 3.5, Glucose 101, SCr 1.84. Patient was admitted for further evaluation and management.   Echo reviewed personally EF 60-65% no RWMA. RV ok. Personally reviewed  At the time of this evaluation, patient reports some pain in his hand but denies any chest pain.   Patient denies any history of tobacco use. Patient has some history of heart disease in a few of his maternal uncles.   Past Medical History   Past Medical History:  Diagnosis Date   Arthritis    Carpal tunnel syndrome, bilateral 10/09/2017   GERD (gastroesophageal reflux disease)    Hepatitis 1966   Drug reaction after taking medication   Hyperlipemia    Hypertension    Lung nodule seen on imaging study    bilateral lungs  Past Surgical History:  Procedure Laterality Date   APPENDECTOMY     arthroscoyp  2000   left knee   COLONOSCOPY W/ POLYPECTOMY     LUMBAR LAMINECTOMY/DECOMPRESSION MICRODISCECTOMY  06/27/2012   Procedure: LUMBAR LAMINECTOMY/DECOMPRESSION MICRODISCECTOMY 1 LEVEL;  Surgeon: [name], MD;  Location: [facility];  Service: Neurosurgery;  Laterality:  Bilateral;  bilateral four-five laminectony   SKIN CANCER EXCISION  5-6 years ago   moe-s surgery- basal b   TONSILLECTOMY       Allergies  No Known Allergies  Inpatient Medications     bisoprolol  10 mg Oral Daily   cholecalciferol  2,000 Units Oral Daily   lidocaine-EPINEPHrine  10 mL Infiltration Once   polymixin-bacitracin   Topical BID   simvastatin  20 mg Oral q1800   sodium chloride flush  3 mL Intravenous Q12H   tamsulosin  0.4 mg Oral Daily    Family History    Family History  Problem Relation Age of Onset   Sudden death Mother 43       possible botulism   Dementia Father    Seizures Sister    He indicated that his mother is deceased. He indicated that his father is deceased. He indicated that both of his sisters are alive. He indicated that both of his daughters are alive.   Social History    Social History   Socioeconomic History   Marital status: Married    Spouse name: [name]    Number of children: 2   Years of education: PhD   Highest education level: Not on file  Occupational History   Occupation: Retired  Scientist, product/process development strain: Not on [employer] insecurity:    Worry: Not on file    Inability: Not on Lexicographer needs:    Medical: Not on file    Non-medical: Not on file  Tobacco Use   Smoking status: Never Smoker   Smokeless tobacco: Never Used  Substance and Sexual Activity   Alcohol use: Yes    Alcohol/week: 2.0 standard drinks    Types: 2 Cans of beer per week    Comment: rare 2 beers per month   Drug use: No   Sexual activity: Not on file  Lifestyle   Physical activity:    Days per week: Not on file    Minutes per session: Not on file   Stress: Not on file  Relationships   Social connections:    Talks on phone: Not on file    Gets together: Not on file    Attends religious service: Not on file    Active member of club or organization: Not on file    Attends meetings  of clubs or organizations: Not on file    Relationship status: Not on file   Intimate partner violence:    Fear of current or ex partner: Not on file    Emotionally abused: Not on file    Physically abused: Not on file    Forced sexual activity: Not on file  Other Topics Concern   Not on file  Social History Narrative   Lives with wife   Caffeine use: 1 cup coffee per day   Diet coke      Right handed      Review of Systems    Review of Systems  Constitutional: Negative for chills and fever.  HENT: Negative for congestion and sore throat.   Respiratory: Positive  for shortness of breath. Negative for cough, hemoptysis and sputum production.   Cardiovascular: Positive for chest pain. Negative for palpitations, orthopnea, leg swelling and PND.  Gastrointestinal: Negative for blood in stool, diarrhea, nausea and vomiting.  Genitourinary: Negative for hematuria.  Musculoskeletal: Positive for falls. Negative for myalgias.  Neurological: Positive for dizziness and loss of consciousness.  Endo/Heme/Allergies: Does not bruise/bleed easily.  Psychiatric/Behavioral: Negative for suicidal ideas.    Physical Exam    Physical Exam per MD:  Blood pressure (!) 130/57, pulse (!) 58, temperature 98.1 F (36.7 C), temperature source Oral, resp. rate 15, height 5\' 3"  (1.6 m), weight 73.8 kg, SpO2 97 %.  General: 78 y.o. male resting comfortably in no acute distress. Pleasant and cooperative.   per MD exam   Labs    Troponin (Point of Care Test) No results for input(s): TROPIPOC in the last 72 hours. Recent Labs    10/15/18 1806 10/15/18 2335 10/16/18 0339  TROPONINI <0.03 0.07* 0.10*   Lab Results  Component Value Date   WBC 39.7 (H) 10/16/2018   HGB 11.8 (L) 10/16/2018   HCT 36.1 (L) 10/16/2018   MCV 92.6 10/16/2018   PLT 29 (LL) 10/16/2018    Recent Labs  Lab 10/16/18 0339  NA 138  K 3.8  CL 107  CO2 20*  BUN 27*  CREATININE 1.70*  CALCIUM 8.6*  GLUCOSE 94    Lab Results  Component Value Date   CHOL 106 10/16/2018   HDL 20 (L) 10/16/2018   LDLCALC 61 10/16/2018   TRIG 125 10/16/2018   No results found for: [facility]   Radiology Studies    Ct Head Wo Contrast  Result Date: 10/15/2018 CLINICAL DATA:  78 year old male status post syncope and blunt trauma. Nose and forehead lacerations. EXAM: CT HEAD WITHOUT CONTRAST CT MAXILLOFACIAL WITHOUT CONTRAST CT CERVICAL SPINE WITHOUT CONTRAST TECHNIQUE: Multidetector CT imaging of the head, cervical spine, and maxillofacial structures were performed using the standard protocol without intravenous contrast. Multiplanar CT image reconstructions of the cervical spine and maxillofacial structures were also generated. COMPARISON:  Paranasal sinus CT 04/06/2011. FINDINGS: CT HEAD FINDINGS Brain: Trace subarachnoid hemorrhage along the medial aspect of the right superior frontal gyrus on series 3, image 20 and series 5, image 21. No subdural blood or other extra-axial hemorrhage identified. No intraventricular hemorrhage. No parenchymal hemorrhagic contusion identified. Partially calcified left frontoparietal convexity 11-13 millimeter meningioma suspected (series 3, image 26 and coronal image 52). No significant mass effect. No cerebral edema. Gray-white matter differentiation is within normal limits for age. No ventriculomegaly or intracranial mass effect. No cortically based acute infarct identified. Vascular: Calcified atherosclerosis at the skull base. No suspicious intracranial vascular hyperdensity. Skull: Calvarium appears intact. See facial bone findings below. Other: Forehead scalp hematoma tracks cephalad. See facial bone findings below. Other scalp soft tissues appear negative. CT MAXILLOFACIAL FINDINGS Osseous: Comminuted and impacted bilateral anterior nasal bone fractures with associated nasal bridge soft tissue swelling and soft tissue gas. The maxillary nasal processes remain intact. However, there is mild  associated deformity of the anterior superior nasal septum. There is also abnormal soft tissue gas in and around the right medial frontoethmoidal confluence, with a possible nondisplaced fracture there on series 8, image 64. Otherwise the anterior walls of the frontal sinuses appear intact. Nondisplaced fracture versus nutrient foramen of the right mandible in the parasymphyseal region best seen on series 13, image 42. Elsewhere the mandible appears intact and normally aligned, but there is superimposed poor dentition.  No maxilla or zygoma fracture. Poor maxillary dentition posteriorly. Central skull base intact. Pterygoid plates intact. Orbits: Trace gas in the anterior superior right orbit suspicious for nondisplaced lamina papyracea fracture there on series 8, image 70 and series 12, image 33. Other orbital walls are intact. Globes are intact. There is preseptal soft tissue swelling/hematoma, but no intraorbital hematoma. No other intraorbital gas. Sinuses: Trace hemorrhage layering in the right frontal sinus (series 6, image 68) associated with the nondisplaced frontoethmoidal fractures described above. Mild right anterior ethmoid bladder mucosal thickening. Minimal to mild paranasal sinus mucosal thickening elsewhere. Small volume retained secretions in the nasal cavity. Leftward anterior nasal septal deviation may be acute and associated with the nasal bone fractures described earlier. Negative nasal cavity otherwise. Tympanic cavities and mastoids are clear. Soft tissues: Negative visible noncontrast larynx, pharynx, parapharyngeal spaces, retropharyngeal space, sublingual space, submandibular and parotid glands. There is moderate bilateral level 1, level 2, and visible level 3 lymphadenopathy with enlarged individual nodes up to 17 millimeters short axis. CT CERVICAL SPINE FINDINGS Alignment: Mild reversal of cervical lordosis. Cervicothoracic junction alignment is within normal limits. Bilateral posterior  element alignment is within normal limits. Skull base and vertebrae: Visualized skull base is intact. No atlanto-occipital dissociation. No acute osseous abnormality identified. Soft tissues and spinal canal: No prevertebral fluid or swelling. No visible canal hematoma. Generalized bilateral cervical lymphadenopathy continuing to the thoracic inlet (series 15, image 64). Disc levels: Congenital incomplete segmentation versus acquired ankylosis of C6-C7. Advanced cervical disc and endplate degeneration elsewhere. At least mild multifactorial spinal stenosis at C4-C5 and C5-C6. Upper chest: Negative lung apices. Grossly intact visible upper thoracic levels. IMPRESSION: 1. Positive for trace posttraumatic subarachnoid hemorrhage along the medial aspect of the right superior frontal gyrus. No other acute traumatic injury to the brain identified. 2. Positive for generalized cervical Lymphadenopathy continuing to the thoracic inlet. On discussion with Dr. Tyrone Nine in the ED he advises the patient has chronic lymphocytic leukemia (CLL) which explains this appearance. 3. Comminuted and impacted bilateral nasal bone fractures. Probable associated fracture of the anterior superior nasal septum. Nondisplaced fracture of the anterior right frontoethmoidal confluence suspected, with mild regional soft tissue gas. Nondisplaced fracture of the right anterior lamina papyracea with trace gas in the anterior superior right orbit. No intraorbital hematoma. 4. Probable nutrient foramen of the right mandible parasymphysis, less likely a nondisplaced fracture (series 13, image 42). 5. No acute fracture in the cervical spine. 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 Study discussed by telephone with Dr. Tyrone Nine on 10/15/2018 at 19:50 . Electronically Signed   By: Genevie Ann M.D.   On: 10/15/2018 19:56   Ct Angio Chest Pe W And/or Wo Contrast  Result Date: 10/15/2018 CLINICAL DATA:   78 year old male status post syncope and blunt trauma. Chest tightness, shortness of breath. CLL. EXAM: CT ANGIOGRAPHY CHEST WITH CONTRAST TECHNIQUE: Multidetector CT imaging of the chest was performed using the standard protocol during bolus administration of intravenous contrast. Multiplanar CT image reconstructions and MIPs were obtained to evaluate the vascular anatomy. CONTRAST:  33mL OMNIPAQUE IOHEXOL 350 MG/ML SOLN COMPARISON:  Head face and cervical spine CT today reported separately. Chest radiographs 08/17/2016. Chest CT 03/16/2009. FINDINGS: Cardiovascular: Good contrast bolus timing in the pulmonary arterial tree. Mild respiratory motion. No focal filling defect identified in the pulmonary arteries to suggest acute pulmonary embolism. Negative visible aorta aside from mild atherosclerosis. Mild cardiomegaly. No pericardial effusion. Mediastinum/Nodes: Lower cervical lymphadenopathy continuing  to the thoracic inlet as seen on the face and cervical spine imaging today. There is mild to moderate mediastinal and hilar lymphadenopathy, new since 2010. Individual nodes measure up to 12 millimeter short axis. Lungs/Pleura: Major airways are patent. No pneumothorax or pleural effusion. No pulmonary contusion. Mild chronic scarring in the lingula and medial segment of the right middle lobe. There is a round 10-11 millimeter anterior basal segment left lower lobe lung nodule which was 7 millimeters in 2010. This is most likely benign. Upper Abdomen: Partially visible splenomegaly, new since 2010. Negative visible liver and bowel in the upper abdomen. Musculoskeletal: No rib or sternal fracture identified. Thoracic spine degeneration. No acute osseous abnormality identified. Review of the MIP images confirms the above findings. IMPRESSION: 1. No evidence of acute pulmonary embolus. No acute traumatic injury identified in the chest. 2. History of CLL with evidence of active disease: splenomegaly, cervical and  mediastinal lymphadenopathy. 3. No acute pulmonary abnormality. A solitary lung nodule in the left lower lobe has minimally increased from 7 to 11 mm over the past 10 years compatible with benign etiology. 4. Mild cardiomegaly. Electronically Signed   By: Genevie Ann M.D.   On: 10/15/2018 20:01   Ct Cervical Spine Wo Contrast  Result Date: 10/15/2018 CLINICAL DATA:  78 year old male status post syncope and blunt trauma. Nose and forehead lacerations. EXAM: CT HEAD WITHOUT CONTRAST CT MAXILLOFACIAL WITHOUT CONTRAST CT CERVICAL SPINE WITHOUT CONTRAST TECHNIQUE: Multidetector CT imaging of the head, cervical spine, and maxillofacial structures were performed using the standard protocol without intravenous contrast. Multiplanar CT image reconstructions of the cervical spine and maxillofacial structures were also generated. COMPARISON:  Paranasal sinus CT 04/06/2011. FINDINGS: CT HEAD FINDINGS Brain: Trace subarachnoid hemorrhage along the medial aspect of the right superior frontal gyrus on series 3, image 20 and series 5, image 21. No subdural blood or other extra-axial hemorrhage identified. No intraventricular hemorrhage. No parenchymal hemorrhagic contusion identified. Partially calcified left frontoparietal convexity 11-13 millimeter meningioma suspected (series 3, image 26 and coronal image 52). No significant mass effect. No cerebral edema. Gray-white matter differentiation is within normal limits for age. No ventriculomegaly or intracranial mass effect. No cortically based acute infarct identified. Vascular: Calcified atherosclerosis at the skull base. No suspicious intracranial vascular hyperdensity. Skull: Calvarium appears intact. See facial bone findings below. Other: Forehead scalp hematoma tracks cephalad. See facial bone findings below. Other scalp soft tissues appear negative. CT MAXILLOFACIAL FINDINGS Osseous: Comminuted and impacted bilateral anterior nasal bone fractures with associated nasal bridge  soft tissue swelling and soft tissue gas. The maxillary nasal processes remain intact. However, there is mild associated deformity of the anterior superior nasal septum. There is also abnormal soft tissue gas in and around the right medial frontoethmoidal confluence, with a possible nondisplaced fracture there on series 8, image 64. Otherwise the anterior walls of the frontal sinuses appear intact. Nondisplaced fracture versus nutrient foramen of the right mandible in the parasymphyseal region best seen on series 13, image 42. Elsewhere the mandible appears intact and normally aligned, but there is superimposed poor dentition. No maxilla or zygoma fracture. Poor maxillary dentition posteriorly. Central skull base intact. Pterygoid plates intact. Orbits: Trace gas in the anterior superior right orbit suspicious for nondisplaced lamina papyracea fracture there on series 8, image 70 and series 12, image 33. Other orbital walls are intact. Globes are intact. There is preseptal soft tissue swelling/hematoma, but no intraorbital hematoma. No other intraorbital gas. Sinuses: Trace hemorrhage layering in the right frontal sinus (  series 6, image 68) associated with the nondisplaced frontoethmoidal fractures described above. Mild right anterior ethmoid bladder mucosal thickening. Minimal to mild paranasal sinus mucosal thickening elsewhere. Small volume retained secretions in the nasal cavity. Leftward anterior nasal septal deviation may be acute and associated with the nasal bone fractures described earlier. Negative nasal cavity otherwise. Tympanic cavities and mastoids are clear. Soft tissues: Negative visible noncontrast larynx, pharynx, parapharyngeal spaces, retropharyngeal space, sublingual space, submandibular and parotid glands. There is moderate bilateral level 1, level 2, and visible level 3 lymphadenopathy with enlarged individual nodes up to 17 millimeters short axis. CT CERVICAL SPINE FINDINGS Alignment: Mild  reversal of cervical lordosis. Cervicothoracic junction alignment is within normal limits. Bilateral posterior element alignment is within normal limits. Skull base and vertebrae: Visualized skull base is intact. No atlanto-occipital dissociation. No acute osseous abnormality identified. Soft tissues and spinal canal: No prevertebral fluid or swelling. No visible canal hematoma. Generalized bilateral cervical lymphadenopathy continuing to the thoracic inlet (series 15, image 64). Disc levels: Congenital incomplete segmentation versus acquired ankylosis of C6-C7. Advanced cervical disc and endplate degeneration elsewhere. At least mild multifactorial spinal stenosis at C4-C5 and C5-C6. Upper chest: Negative lung apices. Grossly intact visible upper thoracic levels. IMPRESSION: 1. Positive for trace posttraumatic subarachnoid hemorrhage along the medial aspect of the right superior frontal gyrus. No other acute traumatic injury to the brain identified. 2. Positive for generalized cervical Lymphadenopathy continuing to the thoracic inlet. On discussion with Dr. Tyrone Nine in the ED he advises the patient has chronic lymphocytic leukemia (CLL) which explains this appearance. 3. Comminuted and impacted bilateral nasal bone fractures. Probable associated fracture of the anterior superior nasal septum. Nondisplaced fracture of the anterior right frontoethmoidal confluence suspected, with mild regional soft tissue gas. Nondisplaced fracture of the right anterior lamina papyracea with trace gas in the anterior superior right orbit. No intraorbital hematoma. 4. Probable nutrient foramen of the right mandible parasymphysis, less likely a nondisplaced fracture (series 13, image 42). 5. No acute fracture in the cervical spine. 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 Study discussed by telephone with Dr. Tyrone Nine on 10/15/2018 at 19:50 . Electronically Signed   By: Genevie Ann  M.D.   On: 10/15/2018 19:56   Mr Brain Wo Contrast (neuro Protocol)  Result Date: 10/16/2018 CLINICAL DATA:  Initial evaluation for acute syncope, bilateral hand tingling. EXAM: MRI HEAD WITHOUT CONTRAST MRI CERVICAL SPINE WITHOUT CONTRAST TECHNIQUE: Multiplanar, multiecho pulse sequences of the brain and surrounding structures, and cervical spine, to include the craniocervical junction and cervicothoracic junction, were obtained without intravenous contrast. COMPARISON:  Prior CT from 10/15/2018 FINDINGS: MRI HEAD FINDINGS Brain: Cerebral volume within normal limits. No significant cerebral white matter changes for age. No abnormal foci of restricted diffusion to suggest acute or subacute ischemia. Gray-white matter differentiation maintained. No encephalomalacia to suggest chronic cortical infarction. Scattered small volume susceptibility artifact overlying the medial aspect of the right superior frontal gyrus compatible with small volume posttraumatic subarachnoid hemorrhage, stable from prior CT. No other new or progressive intracranial hemorrhage. Trace layering blood products noted within the occipital horns of both lateral ventricles, compatible with redistribution. 13 mm meningioma overlies the parasagittal left parietal convexity (series 11, image 20). No associated edema or mass effect. No other mass lesion. No midline shift or mass effect. No hydrocephalus. No extra-axial fluid collection. Pituitary gland suprasellar region normal. Midline structures intact and normal. Vascular: Major intracranial vascular flow voids are well maintained. Skull and  upper cervical spine: Craniocervical junction normal. Bone marrow signal intensity within normal limits. Focal 17 mm T1 hypointense lesion present within the right parietal calvarium (series 9, image 6). Additional possible 2 cm lesion posteriorly within the left occipital calvarium (series 9, image 15). Findings are indeterminate. No corresponding osseous  lesion seen on prior CT. Soft tissue swelling seen at the forehead, extending inferiorly to involve the nasal bridge and nose, also seen on prior maxillofacial CT. Associated maxillofacial fractures better evaluated on prior exam. Sinuses/Orbits: Globes and orbital soft tissues within normal limits. Probable right orbital fracture better seen on prior CT. Scattered mucosal thickening throughout the ethmoidal air cells and maxillary sinuses. No significant mastoid effusion. Inner ear structures normal. Other: None. MRI CERVICAL SPINE FINDINGS Alignment: Straightening with slight reversal of the normal cervical lordosis, apex at C5-6. Trace anterolisthesis of C4 on C5, likely chronic and facet mediated. No malalignment. Vertebrae: Vertebral body heights maintained without evidence for acute or chronic fracture. C6 and C7 vertebral bodies are largely ankylosed. Underlying bone marrow signal intensity somewhat diffusely decreased on T1 weighted imaging, suspected to be related history of CLL. No discrete or worrisome osseous lesions. Cord: Signal intensity within the cervical spinal cord is normal. No evidence for traumatic cord injury or myelomalacia. Posterior Fossa, vertebral arteries, paraspinal tissues: Craniocervical junction normal. Paraspinous and prevertebral soft tissues demonstrate no acute finding. No evidence for ligamentous injury. Prominent bilateral cervical adenopathy again noted, compatible with history of CLL. Normal intravascular flow voids seen within the vertebral arteries bilaterally. Disc levels: C2-C3: Mild bilateral uncovertebral and facet hypertrophy. No significant spinal stenosis. Mild right C3 foraminal narrowing. C3-C4: Broad-based posterior disc protrusion indents the ventral thecal sac. Mild facet and ligament flavum hypertrophy. Mild spinal stenosis without cord deformity. Superimposed uncovertebral hypertrophy with resultant moderate right worse than left C4 foraminal stenosis. C4-C5:  Mild diffuse disc bulge with uncovertebral hypertrophy. Superimposed facet and ligament flavum hypertrophy. Resultant moderate spinal stenosis without significant cord deformity. Severe left with moderate right C5 foraminal narrowing. C5-C6: Diffuse circumferential disc osteophyte with intervertebral disc space narrowing. Superimposed facet and ligament flavum hypertrophy. Resultant severe spinal stenosis with mild cord flattening. No cord signal changes. Thecal sac measures 7 mm in AP diameter. Severe bilateral C6 foraminal narrowing, right worse than left. C6-C7: C6 and C7 vertebral bodies are partially ankylosed. Associated endplate osseous ridging with uncovertebral hypertrophy. Flattening of the ventral thecal sac without significant spinal stenosis. Moderate right with mild left C7 foraminal stenosis. C7-T1: Mild disc bulge with right-sided uncovertebral hypertrophy. Mild right-sided facet degeneration. No spinal stenosis. Mild right C8 foraminal stenosis. Visualized upper thoracic spine demonstrates no significant finding. IMPRESSION: MRI HEAD IMPRESSION: 1. Small volume acute posttraumatic subarachnoid hemorrhage involving the medial right frontal lobe, stable from prior CT. No evidence for new or progressive hemorrhage. 2. No other acute intracranial abnormality. 3. Focal 1-2 cm osseous lesions involving the right parietal and occipital calvarium as above, indeterminate. Findings are of uncertain significance, with no definite corresponding osseous lesion seen on prior CT. Correlation with dedicated bone scan suggested for further evaluation. 4. Soft tissue contusion involving the forehead and central face with associated facial fractures, better seen on prior CT. 5. 13 mm meningioma overlying the left parietal convexity without associated edema. MRI CERVICAL SPINE IMPRESSION: 1. No acute traumatic injury or other finding within the cervical spine status post recent syncope and fall. No evidence for spinal  cord or ligamentous injury. 2. Moderate multilevel cervical spondylolysis with resultant moderate spinal stenosis at  C4-5 and more severe narrowing at C5-6. 3. Multifactorial degenerative changes with resultant multilevel foraminal narrowing as above. Notable findings include moderate bilateral C4 foraminal stenosis, severe left with moderate right C5 foraminal narrowing, severe bilateral C6 foraminal stenosis, with moderate right C7 foraminal narrowing. 4. Prominent bulky cervical adenopathy within the visualized neck, compatible with history of CLL. Electronically Signed   By: Jeannine Boga M.D.   On: 10/16/2018 02:32   Mr Cervical Spine Wo Contrast  Result Date: 10/16/2018 CLINICAL DATA:  Initial evaluation for acute syncope, bilateral hand tingling. EXAM: MRI HEAD WITHOUT CONTRAST MRI CERVICAL SPINE WITHOUT CONTRAST TECHNIQUE: Multiplanar, multiecho pulse sequences of the brain and surrounding structures, and cervical spine, to include the craniocervical junction and cervicothoracic junction, were obtained without intravenous contrast. COMPARISON:  Prior CT from 10/15/2018 FINDINGS: MRI HEAD FINDINGS Brain: Cerebral volume within normal limits. No significant cerebral white matter changes for age. No abnormal foci of restricted diffusion to suggest acute or subacute ischemia. Gray-white matter differentiation maintained. No encephalomalacia to suggest chronic cortical infarction. Scattered small volume susceptibility artifact overlying the medial aspect of the right superior frontal gyrus compatible with small volume posttraumatic subarachnoid hemorrhage, stable from prior CT. No other new or progressive intracranial hemorrhage. Trace layering blood products noted within the occipital horns of both lateral ventricles, compatible with redistribution. 13 mm meningioma overlies the parasagittal left parietal convexity (series 11, image 20). No associated edema or mass effect. No other mass lesion. No  midline shift or mass effect. No hydrocephalus. No extra-axial fluid collection. Pituitary gland suprasellar region normal. Midline structures intact and normal. Vascular: Major intracranial vascular flow voids are well maintained. Skull and upper cervical spine: Craniocervical junction normal. Bone marrow signal intensity within normal limits. Focal 17 mm T1 hypointense lesion present within the right parietal calvarium (series 9, image 6). Additional possible 2 cm lesion posteriorly within the left occipital calvarium (series 9, image 15). Findings are indeterminate. No corresponding osseous lesion seen on prior CT. Soft tissue swelling seen at the forehead, extending inferiorly to involve the nasal bridge and nose, also seen on prior maxillofacial CT. Associated maxillofacial fractures better evaluated on prior exam. Sinuses/Orbits: Globes and orbital soft tissues within normal limits. Probable right orbital fracture better seen on prior CT. Scattered mucosal thickening throughout the ethmoidal air cells and maxillary sinuses. No significant mastoid effusion. Inner ear structures normal. Other: None. MRI CERVICAL SPINE FINDINGS Alignment: Straightening with slight reversal of the normal cervical lordosis, apex at C5-6. Trace anterolisthesis of C4 on C5, likely chronic and facet mediated. No malalignment. Vertebrae: Vertebral body heights maintained without evidence for acute or chronic fracture. C6 and C7 vertebral bodies are largely ankylosed. Underlying bone marrow signal intensity somewhat diffusely decreased on T1 weighted imaging, suspected to be related history of CLL. No discrete or worrisome osseous lesions. Cord: Signal intensity within the cervical spinal cord is normal. No evidence for traumatic cord injury or myelomalacia. Posterior Fossa, vertebral arteries, paraspinal tissues: Craniocervical junction normal. Paraspinous and prevertebral soft tissues demonstrate no acute finding. No evidence for  ligamentous injury. Prominent bilateral cervical adenopathy again noted, compatible with history of CLL. Normal intravascular flow voids seen within the vertebral arteries bilaterally. Disc levels: C2-C3: Mild bilateral uncovertebral and facet hypertrophy. No significant spinal stenosis. Mild right C3 foraminal narrowing. C3-C4: Broad-based posterior disc protrusion indents the ventral thecal sac. Mild facet and ligament flavum hypertrophy. Mild spinal stenosis without cord deformity. Superimposed uncovertebral hypertrophy with resultant moderate right worse than left C4 foraminal stenosis.  C4-C5: Mild diffuse disc bulge with uncovertebral hypertrophy. Superimposed facet and ligament flavum hypertrophy. Resultant moderate spinal stenosis without significant cord deformity. Severe left with moderate right C5 foraminal narrowing. C5-C6: Diffuse circumferential disc osteophyte with intervertebral disc space narrowing. Superimposed facet and ligament flavum hypertrophy. Resultant severe spinal stenosis with mild cord flattening. No cord signal changes. Thecal sac measures 7 mm in AP diameter. Severe bilateral C6 foraminal narrowing, right worse than left. C6-C7: C6 and C7 vertebral bodies are partially ankylosed. Associated endplate osseous ridging with uncovertebral hypertrophy. Flattening of the ventral thecal sac without significant spinal stenosis. Moderate right with mild left C7 foraminal stenosis. C7-T1: Mild disc bulge with right-sided uncovertebral hypertrophy. Mild right-sided facet degeneration. No spinal stenosis. Mild right C8 foraminal stenosis. Visualized upper thoracic spine demonstrates no significant finding. IMPRESSION: MRI HEAD IMPRESSION: 1. Small volume acute posttraumatic subarachnoid hemorrhage involving the medial right frontal lobe, stable from prior CT. No evidence for new or progressive hemorrhage. 2. No other acute intracranial abnormality. 3. Focal 1-2 cm osseous lesions involving the right  parietal and occipital calvarium as above, indeterminate. Findings are of uncertain significance, with no definite corresponding osseous lesion seen on prior CT. Correlation with dedicated bone scan suggested for further evaluation. 4. Soft tissue contusion involving the forehead and central face with associated facial fractures, better seen on prior CT. 5. 13 mm meningioma overlying the left parietal convexity without associated edema. MRI CERVICAL SPINE IMPRESSION: 1. No acute traumatic injury or other finding within the cervical spine status post recent syncope and fall. No evidence for spinal cord or ligamentous injury. 2. Moderate multilevel cervical spondylolysis with resultant moderate spinal stenosis at C4-5 and more severe narrowing at C5-6. 3. Multifactorial degenerative changes with resultant multilevel foraminal narrowing as above. Notable findings include moderate bilateral C4 foraminal stenosis, severe left with moderate right C5 foraminal narrowing, severe bilateral C6 foraminal stenosis, with moderate right C7 foraminal narrowing. 4. Prominent bulky cervical adenopathy within the visualized neck, compatible with history of CLL. Electronically Signed   By: Jeannine Boga M.D.   On: 10/16/2018 02:32   Ct Maxillofacial Wo Cm  Result Date: 10/15/2018 CLINICAL DATA:  79 year old male status post syncope and blunt trauma. Nose and forehead lacerations. EXAM: CT HEAD WITHOUT CONTRAST CT MAXILLOFACIAL WITHOUT CONTRAST CT CERVICAL SPINE WITHOUT CONTRAST TECHNIQUE: Multidetector CT imaging of the head, cervical spine, and maxillofacial structures were performed using the standard protocol without intravenous contrast. Multiplanar CT image reconstructions of the cervical spine and maxillofacial structures were also generated. COMPARISON:  Paranasal sinus CT 04/06/2011. FINDINGS: CT HEAD FINDINGS Brain: Trace subarachnoid hemorrhage along the medial aspect of the right superior frontal gyrus on series  3, image 20 and series 5, image 21. No subdural blood or other extra-axial hemorrhage identified. No intraventricular hemorrhage. No parenchymal hemorrhagic contusion identified. Partially calcified left frontoparietal convexity 11-13 millimeter meningioma suspected (series 3, image 26 and coronal image 52). No significant mass effect. No cerebral edema. Gray-white matter differentiation is within normal limits for age. No ventriculomegaly or intracranial mass effect. No cortically based acute infarct identified. Vascular: Calcified atherosclerosis at the skull base. No suspicious intracranial vascular hyperdensity. Skull: Calvarium appears intact. See facial bone findings below. Other: Forehead scalp hematoma tracks cephalad. See facial bone findings below. Other scalp soft tissues appear negative. CT MAXILLOFACIAL FINDINGS Osseous: Comminuted and impacted bilateral anterior nasal bone fractures with associated nasal bridge soft tissue swelling and soft tissue gas. The maxillary nasal processes remain intact. However, there is mild associated deformity  of the anterior superior nasal septum. There is also abnormal soft tissue gas in and around the right medial frontoethmoidal confluence, with a possible nondisplaced fracture there on series 8, image 64. Otherwise the anterior walls of the frontal sinuses appear intact. Nondisplaced fracture versus nutrient foramen of the right mandible in the parasymphyseal region best seen on series 13, image 42. Elsewhere the mandible appears intact and normally aligned, but there is superimposed poor dentition. No maxilla or zygoma fracture. Poor maxillary dentition posteriorly. Central skull base intact. Pterygoid plates intact. Orbits: Trace gas in the anterior superior right orbit suspicious for nondisplaced lamina papyracea fracture there on series 8, image 70 and series 12, image 33. Other orbital walls are intact. Globes are intact. There is preseptal soft tissue  swelling/hematoma, but no intraorbital hematoma. No other intraorbital gas. Sinuses: Trace hemorrhage layering in the right frontal sinus (series 6, image 68) associated with the nondisplaced frontoethmoidal fractures described above. Mild right anterior ethmoid bladder mucosal thickening. Minimal to mild paranasal sinus mucosal thickening elsewhere. Small volume retained secretions in the nasal cavity. Leftward anterior nasal septal deviation may be acute and associated with the nasal bone fractures described earlier. Negative nasal cavity otherwise. Tympanic cavities and mastoids are clear. Soft tissues: Negative visible noncontrast larynx, pharynx, parapharyngeal spaces, retropharyngeal space, sublingual space, submandibular and parotid glands. There is moderate bilateral level 1, level 2, and visible level 3 lymphadenopathy with enlarged individual nodes up to 17 millimeters short axis. CT CERVICAL SPINE FINDINGS Alignment: Mild reversal of cervical lordosis. Cervicothoracic junction alignment is within normal limits. Bilateral posterior element alignment is within normal limits. Skull base and vertebrae: Visualized skull base is intact. No atlanto-occipital dissociation. No acute osseous abnormality identified. Soft tissues and spinal canal: No prevertebral fluid or swelling. No visible canal hematoma. Generalized bilateral cervical lymphadenopathy continuing to the thoracic inlet (series 15, image 64). Disc levels: Congenital incomplete segmentation versus acquired ankylosis of C6-C7. Advanced cervical disc and endplate degeneration elsewhere. At least mild multifactorial spinal stenosis at C4-C5 and C5-C6. Upper chest: Negative lung apices. Grossly intact visible upper thoracic levels. IMPRESSION: 1. Positive for trace posttraumatic subarachnoid hemorrhage along the medial aspect of the right superior frontal gyrus. No other acute traumatic injury to the brain identified. 2. Positive for generalized cervical  Lymphadenopathy continuing to the thoracic inlet. On discussion with Dr. Tyrone Nine in the ED he advises the patient has chronic lymphocytic leukemia (CLL) which explains this appearance. 3. Comminuted and impacted bilateral nasal bone fractures. Probable associated fracture of the anterior superior nasal septum. Nondisplaced fracture of the anterior right frontoethmoidal confluence suspected, with mild regional soft tissue gas. Nondisplaced fracture of the right anterior lamina papyracea with trace gas in the anterior superior right orbit. No intraorbital hematoma. 4. Probable nutrient foramen of the right mandible parasymphysis, less likely a nondisplaced fracture (series 13, image 42). 5. No acute fracture in the cervical spine. 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 Study discussed by telephone with Dr. Tyrone Nine on 10/15/2018 at 19:50 . Electronically Signed   By: Genevie Ann M.D.   On: 10/15/2018 19:56    EKG     EKG: EKG was personally reviewed and demonstrates: NSR No ST-T wave abnormalities. Intervals normal. Personally reviewed  Telemetry: Telemetry was personally reviewed and demonstrates: NSR  Personally reviewed   Cardiac Imaging    Cardiac Catheterization 09/20/2012: Impressions: 1. Focal mid LAD stenosis of moderate severity between 50 and 70%. Ostial ramus of approximately  50%. Small caliber posterior descending artery and posterior lateral artery, possible vaso-reactive. Initial angiogram of left coronary system demonstrated quite significant small caliber vessels diffusely. These seem to improve with administration of intracoronary nitroglycerin. 2. Normal left ventricular systolic function.  LVEDP 14 mmHg.  Ejection fraction 65%.  Recommendations: Medical management with isosorbide for possible vaso-reactivity. He cannot use Viagra with long-acting nitrates. I will also continue low-dose beta blocker, ziac. Hopefully these 2  medications will help with overall symptoms. If symptoms worsen or become more worrisome, further evaluation with flow wire may be useful to evaluate the severity of the LAD lesion. _______________  Echocardiogram 08/19/2016: Study Conclusions: - Left ventricle: The cavity size was normal. Wall thickness was   increased in a pattern of mild LVH. Systolic function was   vigorous. The estimated ejection fraction was in the range of 65%   to 70%. Wall motion was normal; there were no regional wall   motion abnormalities. Features are consistent with a pseudonormal   left ventricular filling pattern, with concomitant abnormal   relaxation and increased filling pressure (grade 2 diastolic   dysfunction). - Aortic valve: There was trivial regurgitation. - Pulmonary arteries: Systolic pressure was mildly increased. PA   peak pressure: 44 mm Hg (S).  Impressions: - Vigorous LV systolic function; mild LVH; grade 2 diastolic   dysfunction; calcified aortic valve with trace AI; mildly   elevated LVOT velocity likely related to vigorous LV function;   trace TR; mildly elevated pulmonary pressure.  Assessment & Plan    Syncope - Patient presented for evaluation of syncopal episode which was preceded by chest tightness and shortness of breath while walking. - EKG normal without ST-T abnormalities or QT prolongation  - Initial troponin negative but repeat minimally elevated at 0.07 and 0.10. - Echo pending. - Presentation concerning for unstable angina and patient has known moderate disease of LAD. Patient would benefit from cardiac catheterization. See below. - Patient denies any palpitations prior to syncopal episode; however, could consider outpatient monitor to rule out any arrhythmias.  Unstable Angina - Patient reports progressive chest tightness and shortness of breath over the last month. Last cardiac catheterization in 2014 showed 50-70% stenosis of the mid LAD. Patient was treated with  Imdur and low-dose beta blocker at that time. Patient has not had any ischemic work-up since then. - Troponin minimally elevated and flat. Not consistent with ACS. - Echo pending. - LDL 61 and hemoglobin A1c 5.4. - Patient currently chest pain free. - Symptoms concerning for unstable angina. Ideally, patient would have cardiac catheterization for further evaluation. 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 Will discuss with MD.  Hypertension - Most recent BP 130/57. - Patient on Bisoprolol-HCTZ 10-6.25mg  daily at home. HCTZ component was held on admission due to AKI.  AKI - Serum creatinine 1.84 on admission. Repeat 1.7 this morning. Unsure of recent baseline.  Subarachnoid Hemorrhage -  Head CT showed trace post-traumatic subarachnoid hemorrhage along the medial aspect of the right superior frontal gyrus. - Management per primary team and Neurosurgery.  Nasal Bone Fracture  - Head CT showed comminuted impacted bilateral nasal bone fractures. - ENT recommended following up as outpatient.  CLL and Thrombocytopenia - Patient reports he has stable and asymptomatic CLL and follows up with Hem-Onc ([name]) regularly.  - WBC 39.7 today. Recent baseline around 30 to 50.  -  Platelet 29 today. Recent baseline in the 30's.  SignedDarreld Mclean, PA-C 10/16/2018, 9:24 AM Pager: [phone] For questions or updates, please contact   Please consult [email or web address] for contact info under Cardiology/STEMI.  Patient seen and examined with the above-signed Advanced Practice Provider and/or Housestaff. I personally reviewed laboratory data, imaging studies and relevant notes. I independently examined the patient and formulated the important aspects of the plan. I have edited the note to reflect any of my changes or salient  points. I have personally discussed the plan with the patient and/or family.  Complicated situation. 78 y/o male with moderate CAD (by cath 2014), CLL with severe thrombocytopenia (30k) admitted with abrupt syncopal episode with severe facial trauma and small SAH in setting of recent exertional CP and SOB.   ECG is normal. Troponin minimally elevated. CT chest negative for PE. Echo EF 60-65% with no RWMAs. RV ok.   On exam General:  Sitting in bed  No resp difficulty HEENT: normal + extensive facial trauma  Neck: supple. no JVD. Carotids 2+ bilat; no bruits. No lymphadenopathy or thryomegaly appreciated. Cor: PMI nondisplaced. Regular rate & rhythm. Soft MR  Lungs: clear Abdomen: soft, nontender, nondistended. No hepatosplenomegaly. No bruits or masses. Good bowel sounds. Extremities: no cyanosis, clubbing, rash, edema Neuro: alert & orientedx3, cranial nerves grossly intact. moves all 4 extremities w/o difficulty. Affect pleasant  By history obvious concern for unstable angina and ischemic-related VT/VF. K 3.5. Currently no arrhythmias on monitor. He will need at least a diagnostic cath to define substrate better. I spoke with Dr. Arnoldo Morale in [facility] who felt SAH is small and satble and would likely be OK for DAPT but low platelets complicate matters. Felt another 24 hours might help.   I spoke with Dr. Ammie Dalton his oncologist who felt he would be at relatively high risk for bleeding with DAPT and if he needed an intervention would prefer to treat him with steroids first to increase his platelets over a week or two before intervening if possible.   I think the best plan would be to proceed with diagnotic-only cardiac cath tomorrow to define his anatomy and if he needs an intervention we can coordinate with [name]. If no high-grade CAD to treat will likely need EP eval and outpatient monitor or ILR. Would keep on tele. Continue statin and b-blocker.   Glori Bickers, MD  11:06 AM

## 2018-10-16 NOTE — Progress Notes (Signed)
  Echocardiogram 2D Echocardiogram has been performed.  [name] L Androw 10/16/2018, 9:07 AM

## 2018-10-16 NOTE — Evaluation (Signed)
Occupational Therapy Evaluation Patient Details Name: [name] MRN: [id number] DOB: [birth year] Today's Date: 10/16/2018    History of Present Illness Pt is a 78 y/o male who presents s/p syncope and fall sustaining facial fractures and traumatic SAH. PMH significant for HTN, hepatitis, bilateral carpal tunnel syndrome, 1 level lumbar  lami/decompression on 06/27/2012.   Clinical Impression   PTA, pt was living with his wife and was independent and active. Pt currently performing ADLs and functional mobility with RW at [place] level. BP stable during session; see general comments. Educating pt on concussion symptoms and techniques for decreasing sensory stimulation at home. Pt would benefit from further acute OT to facilitate safe dc. Recommend dc to home once medically stable per physician.     Follow Up Recommendations  No OT follow up;Supervision/Assistance - 24 hour    Equipment Recommendations  3 in 1 bedside commode    Recommendations for Other Services PT consult     Precautions / Restrictions Precautions Precautions: Fall Precaution Comments: BP Restrictions Weight Bearing Restrictions: No      Mobility Bed Mobility               General bed mobility comments: In recliner upon arrival  Transfers Overall transfer level: Needs assistance Equipment used: Rolling walker (2 wheeled) Transfers: Sit to/from Stand Sit to Stand: Supervision         General transfer comment: supervision for safety    Balance Overall balance assessment: Needs assistance Sitting-balance support: Feet supported;No upper extremity supported Sitting balance-Leahy Scale: Fair     Standing balance support: Single extremity supported;During functional activity Standing balance-Leahy Scale: Fair                             ADL either performed or assessed with clinical judgement   ADL Overall ADL's : Needs assistance/impaired Eating/Feeding: Set  up;Supervision/ safety;Sitting   Grooming: Min guard;Oral care;Standing   Upper Body Bathing: Set up;Supervision/ safety;Sitting   Lower Body Bathing: Min guard;Sit to/from stand   Upper Body Dressing : Set up;Supervision/safety;Sitting   Lower Body Dressing: Min guard;Sit to/from stand               Functional mobility during ADLs: Min guard;Rolling walker General ADL Comments: Pt performing ADLs and fucntional mobility at [employer] A level for safety. Educating pt on concussion symptoms and techniques for decreasing stimulation.     Vision Baseline Vision/History: Wears glasses Wears Glasses: At all times Patient Visual Report: No change from baseline       Perception     Praxis      Pertinent Vitals/Pain Pain Assessment: Faces Faces Pain Scale: Hurts little more Pain Location: Pt reports R hand hurts worst (pins and needles feeling); L hand less painful (pins and needles), and face pain reportedly mild Pain Descriptors / Indicators: Pins and needles Pain Intervention(s): Monitored during session;Limited activity within patient's tolerance;Repositioned     Hand Dominance Right   Extremity/Trunk Assessment Upper Extremity Assessment Upper Extremity Assessment: Generalized weakness   Lower Extremity Assessment Lower Extremity Assessment: Generalized weakness       Communication Communication Communication: No difficulties   Cognition Arousal/Alertness: Awake/alert Behavior During Therapy: WFL for tasks assessed/performed Overall Cognitive Status: Within Functional Limits for tasks assessed  General Comments  BP 109/58 sitting in recliner before activity. BP 117/59 in recliner after activity.    Exercises     Shoulder Instructions      Home Living Family/patient expects to be discharged to:: Private residence Living Arrangements: Spouse/significant other Available Help at Discharge:  Family;Available 24 hours/day Type of Home: House Home Access: Stairs to enter [employer] of Steps: "a few" - prior admission states 1   Home Layout: Two level;Able to live on main level with bedroom/bathroom Alternate Level Stairs-Number of Steps: 14 Alternate Level Stairs-Rails: Right Bathroom Shower/Tub: Occupational psychologist: Standard     Home Equipment: Crutches;Shower seat - built in   Additional Comments: Wife is a Administrator, arts at [employer]. Pt is a retired Hotel manager at [employer]      Prior Functioning/Environment Level of Independence: Independent        Comments: Recent trip to [place] where he was active and independent        OT Problem List: Decreased strength;Decreased range of motion;Decreased knowledge of use of DME or AE;Decreased knowledge of precautions      OT Treatment/Interventions: Self-care/ADL training;Therapeutic exercise;Energy conservation;DME and/or AE instruction;Therapeutic activities;Patient/family education    OT Goals(Current goals can be found in the care plan section) Acute Rehab OT Goals Patient Stated Goal: Home tomorrow OT Goal Formulation: With patient Time For Goal Achievement: 10/30/18 Potential to Achieve Goals: Good  OT Frequency: Min 2X/week   Barriers to D/C:            Co-evaluation              AM-PAC OT "6 Clicks" Daily Activity     Outcome Measure Help from another person eating meals?: None Help from another person taking care of personal grooming?: None Help from another person toileting, which includes using toliet, bedpan, or urinal?: None Help from another person bathing (including washing, rinsing, drying)?: A Little Help from another person to put on and taking off regular upper body clothing?: None Help from another person to put on and taking off regular lower body clothing?: A Little 6 Click Score: 22   End of Session Equipment Utilized During Treatment: Rolling  walker Nurse Communication: Mobility status  Activity Tolerance: Patient tolerated treatment well Patient left: in chair;with call bell/phone within reach;with chair alarm set  OT Visit Diagnosis: Unsteadiness on feet (R26.81);Other abnormalities of gait and mobility (R26.89);Muscle weakness (generalized) (M62.81)                Time: 3716-9678 OT Time Calculation (min): 33 min Charges:  OT General Charges $OT Visit: 1 Visit OT Evaluation $OT Eval Moderate Complexity: 1 Mod OT Treatments $Self Care/Home Management : 8-22 mins  Jay Kempe MSOT, OTR/L Acute Rehab Pager: [phone] Office: [place] 10/16/2018, 3:25 PM

## 2018-10-16 NOTE — TOC Initial Note (Signed)
Transition of Care [facility]) - Initial/Assessment Note    Patient Details  Name: [name] MRN: [id number] Date of Birth: 10/23/1940  [facility] CM/SW Contact:    Pollie Friar, RN Phone Number: 10/16/2018, 2:50 PM  Clinical Narrative:                 Wife able to provide transportation while pt is not able to drive.  Pt denies issues with obtaining or taking meds.  Expected Discharge Plan: [place] Barriers to Discharge: Continued Medical Work up   Patient Goals and CMS Choice        Expected Discharge Plan and Services Expected Discharge Plan: [place]   Discharge Planning Services: CM Consult   Living arrangements for the past 2 months: Single Family Home(2 story but can stay on ground level)                 DME Arranged: 3-N-1, Walker rolling DME Agency: ([employer]) Date DME Agency Contacted: 10/16/18 Time DME Agency Contacted: [phone] Representative spoke with at DME Agency: Barnetta Chapel            Prior Living Arrangements/Services Living arrangements for the past 2 months: Single Family Home(2 story but can stay on ground level) Lives with:: Spouse Patient language and need for interpreter reviewed:: Yes(no needs) Do you feel safe going back to the place where you live?: Yes      Need for Family Participation in Patient Care: Yes (Comment) Care giver support system in place?: Yes (comment)(wife currently working from home and will be done with teaching very soon)   Criminal Activity/Legal Involvement Pertinent to Current Situation/Hospitalization: No - Comment as needed  Activities of Daily Living Home Assistive Devices/Equipment: Eyeglasses ADL Screening (condition at time of admission) Patient's cognitive ability adequate to safely complete daily activities?: Yes Is the patient deaf or have difficulty hearing?: No Does the patient have difficulty seeing, even when wearing glasses/contacts?:  No Does the patient have difficulty concentrating, remembering, or making decisions?: No Patient able to express need for assistance with ADLs?: Yes Does the patient have difficulty dressing or bathing?: Yes Independently performs ADLs?: No Communication: Independent Dressing (OT): Needs assistance Is this a change from baseline?: Change from baseline, expected to last <3days Grooming: Needs assistance Is this a change from baseline?: Change from baseline, expected to last <3 days Feeding: Needs assistance Is this a change from baseline?: Change from baseline, expected to last <3 days Bathing: Needs assistance Is this a change from baseline?: Change from baseline, expected to last <3 days Toileting: Needs assistance Is this a change from baseline?: Change from baseline, expected to last <3 days In/Out Bed: Needs assistance Is this a change from baseline?: Change from baseline, expected to last <3 days Walks in Home: Independent Does the patient have difficulty walking or climbing stairs?: No Weakness of Legs: Both Weakness of Arms/Hands: None  Permission Sought/Granted                  Emotional Assessment Appearance:: Appears stated age Attitude/Demeanor/Rapport: Engaged Affect (typically observed): Accepting, Appropriate, Pleasant Orientation: : Oriented to Self, Oriented to Place, Oriented to  Time, Oriented to Situation   Psych Involvement: No (comment)  Admission diagnosis:  syncope Patient Active Problem List   Diagnosis Date Noted  . Elevated troponin 10/16/2018  . HLD (hyperlipidemia) 10/15/2018  . Syncope 10/15/2018  . Nasal bone fracture 10/15/2018  . Chest tightness 10/15/2018  .  SAH (subarachnoid hemorrhage) ([place]) 10/15/2018  . Acute renal failure superimposed on stage 3 chronic kidney disease ([place]) 10/15/2018  . Thrombocytopenia ([place]) 10/15/2018  . Fall 10/15/2018  . Carpal tunnel syndrome, bilateral 10/09/2017  . Right foot pain   . Enterobacter  sepsis ([place]) 08/19/2016  . Bacteremia due to Enterobacter species 08/19/2016  . Acute encephalopathy 08/17/2016  . Hyperglycemia   . Urinary tract infection with hematuria   . Hypertension 08/16/2016  . AKI (acute kidney injury) ([place]) 08/16/2016  . Hypokalemia 08/16/2016  . CLL (chronic lymphocytic leukemia) ([place]) 12/16/2014  . Dyspnea 09/20/2012   PCP:  [name], MD Pharmacy:   [facility] [address] [place] - [place] AT [place] [address] [place] [address] Phone: [phone] Fax: [phone]  [id number] [place], [place] - [place] [address] [place] [address] Phone: [phone] Fax: [phone]     Social Determinants of Health ([place]) Interventions    Readmission Risk Interventions No flowsheet data found.

## 2018-10-16 NOTE — Evaluation (Signed)
Physical Therapy Evaluation Patient Details Name: [name] MRN: [id number] DOB: Apr 22, 1941 Today's Date: 10/16/2018   History of Present Illness  Pt is a 78 y/o male who presents s/p syncope and fall sustaining facial fractures and traumatic SAH. PMH significant for HTN, hepatitis, bilateral carpal tunnel syndrome, 1 level lumbar  lami/decompression on 06/27/2012.  Clinical Impression  Pt admitted with above diagnosis. Pt currently with functional limitations due to the deficits listed below (see PT Problem List). At the time of PT eval pt was able to perform transfers and ambulation with gross min guard assist to supervision for safety with RW. Pt moving slow and very cautious, and no overt LOB noted throughout session. BP remained stable, remaining in mid 676'P systolically throughout. Acutely, pt will benefit from skilled PT to increase their independence and safety with mobility to allow discharge to the venue listed below.       Follow Up Recommendations Home health PT;Supervision for mobility/OOB    Equipment Recommendations  Rolling walker with 5" wheels    Recommendations for Other Services       Precautions / Restrictions Precautions Precautions: Fall Restrictions Weight Bearing Restrictions: No      Mobility  Bed Mobility Overal bed mobility: Needs Assistance Bed Mobility: Rolling;Sidelying to Sit Rolling: Supervision Sidelying to sit: Supervision       General bed mobility comments: Use of rails and HOB elevated. Pt did not require assistance. Increased time as pt very cautious with movement.   Transfers Overall transfer level: Needs assistance Equipment used: Rolling walker (2 wheeled) Transfers: Sit to/from Stand Sit to Stand: Supervision         General transfer comment: Light supervision provided. Pt was able to power-up to full stand without assistance, however VC's required for hand placement on seated surface for safety.    Ambulation/Gait Ambulation/Gait assistance: Min guard Gait Distance (Feet): 25 Feet Assistive device: Rolling walker (2 wheeled) Gait Pattern/deviations: Step-through pattern;Decreased stride length;Trunk flexed Gait velocity: Decreased Gait velocity interpretation: 1.31 - 2.62 ft/sec, indicative of limited community ambulator General Gait Details: VC's for safety awareness in regards to IV lines. Therapist managed IV pole and pt was able to negotiate around room with RW and hands-on guarding on gait belt for safety. No overt LOB noted.   Stairs            Wheelchair Mobility    Modified Rankin (Stroke Patients Only)       Balance Overall balance assessment: Needs assistance Sitting-balance support: Feet supported;No upper extremity supported Sitting balance-Leahy Scale: Fair     Standing balance support: Single extremity supported;During functional activity Standing balance-Leahy Scale: Fair Standing balance comment: Pt standing at sink to wash some dried blood off of his face. He was able to stand statically with 1 UE on counter for support.                              Pertinent Vitals/Pain Pain Assessment: Faces Faces Pain Scale: Hurts little more Pain Location: Pt reports R hand hurts worst (pins and needles feeling); L hand less painful (pins and needles), and face pain reportedly mild Pain Descriptors / Indicators: Pins and needles Pain Intervention(s): Monitored during session;Repositioned    Home Living Family/patient expects to be discharged to:: Private residence Living Arrangements: Spouse/significant other Available Help at Discharge: Family;Available 24 hours/day Type of Home: House Home Access: Stairs to enter   [employer] of Steps: "a few" -  prior admission states 1 Home Layout: Two level;Able to live on main level with bedroom/bathroom Home Equipment: Crutches;Shower seat - built in      Prior Function Level of  Independence: Independent         Comments: Recent trip to [place] where he was active and independent     Hand Dominance   Dominant Hand: Right    Extremity/Trunk Assessment   Upper Extremity Assessment Upper Extremity Assessment: Generalized weakness    Lower Extremity Assessment Lower Extremity Assessment: Generalized weakness    Cervical / Trunk Assessment Cervical / Trunk Assessment: Other exceptions Cervical / Trunk Exceptions: Cervical collar in place  Communication   Communication: No difficulties  Cognition Arousal/Alertness: Awake/alert Behavior During Therapy: WFL for tasks assessed/performed Overall Cognitive Status: Within Functional Limits for tasks assessed                                        General Comments      Exercises     Assessment/Plan    PT Assessment Patient needs continued PT services  PT Problem List Decreased strength;Decreased activity tolerance;Decreased balance;Decreased mobility;Decreased knowledge of use of DME;Decreased safety awareness;Decreased knowledge of precautions;Pain       PT Treatment Interventions DME instruction;Stair training;Gait training;Functional mobility training;Therapeutic activities;Therapeutic exercise;Neuromuscular re-education;Patient/family education    PT Goals (Current goals can be found in the Care Plan section)  Acute Rehab PT Goals Patient Stated Goal: Home tomorrow PT Goal Formulation: With patient Time For Goal Achievement: 10/23/18 Potential to Achieve Goals: Good    Frequency Min 3X/week   Barriers to discharge Decreased caregiver support      Co-evaluation               AM-PAC PT "6 Clicks" Mobility  Outcome Measure Help needed turning from your back to your side while in a flat bed without using bedrails?: None Help needed moving from lying on your back to sitting on the side of a flat bed without using bedrails?: None Help needed moving to and from a bed to  a chair (including a wheelchair)?: None Help needed standing up from a chair using your arms (e.g., wheelchair or bedside chair)?: A Little Help needed to walk in hospital room?: A Little Help needed climbing 3-5 steps with a railing? : A Little 6 Click Score: 21    End of Session Equipment Utilized During Treatment: Cervical collar Activity Tolerance: Patient tolerated treatment well Patient left: in chair;with call bell/phone within reach;with chair alarm set Nurse Communication: Mobility status PT Visit Diagnosis: Unsteadiness on feet (R26.81);Difficulty in walking, not elsewhere classified (R26.2);Pain Pain - Right/Left: Right Pain - part of body: Hand(Most painful)    Time: 1000-1049 PT Time Calculation (min) (ACUTE ONLY): 49 min   Charges:   PT Evaluation $PT Eval Moderate Complexity: 1 Mod PT Treatments $Gait Training: 23-37 mins        Rolinda Roan, PT, DPT Acute Rehabilitation Services Pager: [phone] Office: [phone]   Thelma Comp 10/16/2018, 11:21 AM

## 2018-10-16 NOTE — Consult Note (Signed)
Cardiology Consult    Patient ID: [name] MRN: [id number], DOB/AGE: 78-Jan-1942   Admit date: 10/15/2018 Date of Consult: 10/16/2018  Primary Physician: [name], MD Primary Cardiologist: Previously seen by Dr. [name] in 2014.  Requesting Provider: Ivor Costa, MD  Patient Profile    [name] is a 78 y.o. male with a history of moderate non-obstructive CAD on cardiac catheterization in 2015, hypertension, hyperlipidemia, GERD, CLL, and chronic thrombocytopenia, who is being seen today for the evaluation of elevated troponin at the request of Dr. Blaine Hamper.  History of Present Illness    [name]. Mathews is a 78 year old male with the above history. He had a cardiac catheterization in 09/2012 due to worsening dyspnea on exertion and was found to have 50-70% stenosis of the mid LAD with possible vaso-reactive small caliber posterior descending artery and posterior lateral artery. EF 60%. He was started on Imdur and a low-dose beta-blocker at that time.  Patient presented to the ED yesterday for evaluation of syncope preceded by chest tightness and shortness of breath. Was in [place] in early March and walking without any problem. Over past 3 weeks has been walking in his neighborhood for 15 mins at at time and began to develop chest pressure and arm discomfort, particularly when going up hills.  Patient reports he was walking in his neighborhood yesterday evening when he started to have some intermittent chest tightness which was more severe than usual. Patient states he had been walking for about 10-12 minutes and had to stop to catch his breath 2-3 times. The pain got to the point to which he was worried if he could make it home or not. Patient then passed out and fell hitting the front of his head in the process. Patient denies any other prodromal symptoms including palpitations, lightheadedness, and dizziness. Patient is unaware how long he was unconscious for but does not think it was very  long. Patient states he felt very dizzy after regaining consciousness and has some numbness/pain in his hands. Patient has noticed progressive chest tightness and dyspnea on exertion over the last month that quickly resoles with rest. No orthopnea, PND, or edema. No recent fevers, chills, body aches, respiratory symptoms, or known exposure to COVID-19.  In the ED, patient mildly bradycardic but vitals stable. EKG showed normal sinus rhythm with PVC. No ST-T wave abnormalities. No QT prolongation. No pre-excitation.  Initial troponin negative but repeats minimally elevated at 0.07 and 0.10. Chest CTA showed no evidence of pulmonary embolism. Head CT showed trace post-traumatic subarachnoid hemorrhage along the medial aspect of the right superior frontal gyrus and comminuted impacted bilateral nasal bone fractures. WBC 47.3, Hgb 12.5, Plts 36. Na 139, K 3.5, Glucose 101, SCr 1.84. Patient was admitted for further evaluation and management.   Echo reviewed personally EF 60-65% no RWMA. RV ok. Personally reviewed  At the time of this evaluation, patient reports some pain in his hand but denies any chest pain.   Patient denies any history of tobacco use. Patient has some history of heart disease in a few of his maternal uncles.   Past Medical History   Past Medical History:  Diagnosis Date   Arthritis    Carpal tunnel syndrome, bilateral 10/09/2017   GERD (gastroesophageal reflux disease)    Hepatitis 1966   Drug reaction after taking medication   Hyperlipemia    Hypertension    Lung nodule seen on imaging study    bilateral lungs  Past Surgical History:  Procedure Laterality Date   APPENDECTOMY     arthroscoyp  2000   left knee   COLONOSCOPY W/ POLYPECTOMY     LUMBAR LAMINECTOMY/DECOMPRESSION MICRODISCECTOMY  06/27/2012   Procedure: LUMBAR LAMINECTOMY/DECOMPRESSION MICRODISCECTOMY 1 LEVEL;  Surgeon: [name], MD;  Location: [facility];  Service: Neurosurgery;  Laterality:  Bilateral;  bilateral four-five laminectony   SKIN CANCER EXCISION  5-6 years ago   moe-s surgery- basal b   TONSILLECTOMY       Allergies  No Known Allergies  Inpatient Medications     bisoprolol  10 mg Oral Mathews   cholecalciferol  2,000 Units Oral Mathews   lidocaine-EPINEPHrine  10 mL Infiltration Once   polymixin-bacitracin   Topical BID   simvastatin  20 mg Oral q1800   sodium chloride flush  3 mL Intravenous Q12H   tamsulosin  0.4 mg Oral Mathews    Family History    Family History  Problem Relation Age of Onset   Sudden death Mother 63       possible botulism   Dementia Father    Seizures Sister    He indicated that his mother is deceased. He indicated that his father is deceased. He indicated that both of his sisters are alive. He indicated that both of his daughters are alive.   Social History    Social History   Socioeconomic History   Marital status: Married    Spouse name: [name]    Number of children: 2   Years of education: PhD   Highest education level: Not on file  Occupational History   Occupation: Retired  Scientist, product/process development strain: Not on [employer] insecurity:    Worry: Not on file    Inability: Not on Lexicographer needs:    Medical: Not on file    Non-medical: Not on file  Tobacco Use   Smoking status: Never Smoker   Smokeless tobacco: Never Used  Substance and Sexual Activity   Alcohol use: Yes    Alcohol/week: 2.0 standard drinks    Types: 2 Cans of beer per week    Comment: rare 2 beers per month   Drug use: No   Sexual activity: Not on file  Lifestyle   Physical activity:    Days per week: Not on file    Minutes per session: Not on file   Stress: Not on file  Relationships   Social connections:    Talks on phone: Not on file    Gets together: Not on file    Attends religious service: Not on file    Active member of club or organization: Not on file    Attends meetings  of clubs or organizations: Not on file    Relationship status: Not on file   Intimate partner violence:    Fear of current or ex partner: Not on file    Emotionally abused: Not on file    Physically abused: Not on file    Forced sexual activity: Not on file  Other Topics Concern   Not on file  Social History Narrative   Lives with wife   Caffeine use: 1 cup coffee per day   Diet coke      Right handed      Review of Systems    Review of Systems  Constitutional: Negative for chills and fever.  HENT: Negative for congestion and sore throat.   Respiratory: Positive  for shortness of breath. Negative for cough, hemoptysis and sputum production.   Cardiovascular: Positive for chest pain. Negative for palpitations, orthopnea, leg swelling and PND.  Gastrointestinal: Negative for blood in stool, diarrhea, nausea and vomiting.  Genitourinary: Negative for hematuria.  Musculoskeletal: Positive for falls. Negative for myalgias.  Neurological: Positive for dizziness and loss of consciousness.  Endo/Heme/Allergies: Does not bruise/bleed easily.  Psychiatric/Behavioral: Negative for suicidal ideas.    Physical Exam    Physical Exam per MD:  Blood pressure (!) 130/57, pulse (!) 58, temperature 98.1 F (36.7 C), temperature source Oral, resp. rate 15, height 5\' 3"  (1.6 m), weight 73.8 kg, SpO2 97 %.  General: 78 y.o. male resting comfortably in no acute distress. Pleasant and cooperative.   per MD exam   Labs    Troponin (Point of Care Test) No results for input(s): TROPIPOC in the last 72 hours. Recent Labs    10/15/18 1806 10/15/18 2335 10/16/18 0339  TROPONINI <0.03 0.07* 0.10*   Lab Results  Component Value Date   WBC 39.7 (H) 10/16/2018   HGB 11.8 (L) 10/16/2018   HCT 36.1 (L) 10/16/2018   MCV 92.6 10/16/2018   PLT 29 (LL) 10/16/2018    Recent Labs  Lab 10/16/18 0339  NA 138  K 3.8  CL 107  CO2 20*  BUN 27*  CREATININE 1.70*  CALCIUM 8.6*  GLUCOSE 94    Lab Results  Component Value Date   CHOL 106 10/16/2018   HDL 20 (L) 10/16/2018   LDLCALC 61 10/16/2018   TRIG 125 10/16/2018   No results found for: [facility]   Radiology Studies    Ct Head Wo Contrast  Result Date: 10/15/2018 CLINICAL DATA:  78 year old male status post syncope and blunt trauma. Nose and forehead lacerations. EXAM: CT HEAD WITHOUT CONTRAST CT MAXILLOFACIAL WITHOUT CONTRAST CT CERVICAL SPINE WITHOUT CONTRAST TECHNIQUE: Multidetector CT imaging of the head, cervical spine, and maxillofacial structures were performed using the standard protocol without intravenous contrast. Multiplanar CT image reconstructions of the cervical spine and maxillofacial structures were also generated. COMPARISON:  Paranasal sinus CT 04/06/2011. FINDINGS: CT HEAD FINDINGS Brain: Trace subarachnoid hemorrhage along the medial aspect of the right superior frontal gyrus on series 3, image 20 and series 5, image 21. No subdural blood or other extra-axial hemorrhage identified. No intraventricular hemorrhage. No parenchymal hemorrhagic contusion identified. Partially calcified left frontoparietal convexity 11-13 millimeter meningioma suspected (series 3, image 26 and coronal image 52). No significant mass effect. No cerebral edema. Gray-white matter differentiation is within normal limits for age. No ventriculomegaly or intracranial mass effect. No cortically based acute infarct identified. Vascular: Calcified atherosclerosis at the skull base. No suspicious intracranial vascular hyperdensity. Skull: Calvarium appears intact. See facial bone findings below. Other: Forehead scalp hematoma tracks cephalad. See facial bone findings below. Other scalp soft tissues appear negative. CT MAXILLOFACIAL FINDINGS Osseous: Comminuted and impacted bilateral anterior nasal bone fractures with associated nasal bridge soft tissue swelling and soft tissue gas. The maxillary nasal processes remain intact. However, there is mild  associated deformity of the anterior superior nasal septum. There is also abnormal soft tissue gas in and around the right medial frontoethmoidal confluence, with a possible nondisplaced fracture there on series 8, image 64. Otherwise the anterior walls of the frontal sinuses appear intact. Nondisplaced fracture versus nutrient foramen of the right mandible in the parasymphyseal region best seen on series 13, image 42. Elsewhere the mandible appears intact and normally aligned, but there is superimposed poor dentition.  No maxilla or zygoma fracture. Poor maxillary dentition posteriorly. Central skull base intact. Pterygoid plates intact. Orbits: Trace gas in the anterior superior right orbit suspicious for nondisplaced lamina papyracea fracture there on series 8, image 70 and series 12, image 33. Other orbital walls are intact. Globes are intact. There is preseptal soft tissue swelling/hematoma, but no intraorbital hematoma. No other intraorbital gas. Sinuses: Trace hemorrhage layering in the right frontal sinus (series 6, image 68) associated with the nondisplaced frontoethmoidal fractures described above. Mild right anterior ethmoid bladder mucosal thickening. Minimal to mild paranasal sinus mucosal thickening elsewhere. Small volume retained secretions in the nasal cavity. Leftward anterior nasal septal deviation may be acute and associated with the nasal bone fractures described earlier. Negative nasal cavity otherwise. Tympanic cavities and mastoids are clear. Soft tissues: Negative visible noncontrast larynx, pharynx, parapharyngeal spaces, retropharyngeal space, sublingual space, submandibular and parotid glands. There is moderate bilateral level 1, level 2, and visible level 3 lymphadenopathy with enlarged individual nodes up to 17 millimeters short axis. CT CERVICAL SPINE FINDINGS Alignment: Mild reversal of cervical lordosis. Cervicothoracic junction alignment is within normal limits. Bilateral posterior  element alignment is within normal limits. Skull base and vertebrae: Visualized skull base is intact. No atlanto-occipital dissociation. No acute osseous abnormality identified. Soft tissues and spinal canal: No prevertebral fluid or swelling. No visible canal hematoma. Generalized bilateral cervical lymphadenopathy continuing to the thoracic inlet (series 15, image 64). Disc levels: Congenital incomplete segmentation versus acquired ankylosis of C6-C7. Advanced cervical disc and endplate degeneration elsewhere. At least mild multifactorial spinal stenosis at C4-C5 and C5-C6. Upper chest: Negative lung apices. Grossly intact visible upper thoracic levels. IMPRESSION: 1. Positive for trace posttraumatic subarachnoid hemorrhage along the medial aspect of the right superior frontal gyrus. No other acute traumatic injury to the brain identified. 2. Positive for generalized cervical Lymphadenopathy continuing to the thoracic inlet. On discussion with Dr. Tyrone Nine in the ED he advises the patient has chronic lymphocytic leukemia (CLL) which explains this appearance. 3. Comminuted and impacted bilateral nasal bone fractures. Probable associated fracture of the anterior superior nasal septum. Nondisplaced fracture of the anterior right frontoethmoidal confluence suspected, with mild regional soft tissue gas. Nondisplaced fracture of the right anterior lamina papyracea with trace gas in the anterior superior right orbit. No intraorbital hematoma. 4. Probable nutrient foramen of the right mandible parasymphysis, less likely a nondisplaced fracture (series 13, image 42). 5. No acute fracture in the cervical spine. 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 Study discussed by telephone with Dr. Tyrone Nine on 10/15/2018 at 19:50 . Electronically Signed   By: Genevie Ann M.D.   On: 10/15/2018 19:56   Ct Angio Chest Pe W And/or Wo Contrast  Result Date: 10/15/2018 CLINICAL DATA:   78 year old male status post syncope and blunt trauma. Chest tightness, shortness of breath. CLL. EXAM: CT ANGIOGRAPHY CHEST WITH CONTRAST TECHNIQUE: Multidetector CT imaging of the chest was performed using the standard protocol during bolus administration of intravenous contrast. Multiplanar CT image reconstructions and MIPs were obtained to evaluate the vascular anatomy. CONTRAST:  74mL OMNIPAQUE IOHEXOL 350 MG/ML SOLN COMPARISON:  Head face and cervical spine CT today reported separately. Chest radiographs 08/17/2016. Chest CT 03/16/2009. FINDINGS: Cardiovascular: Good contrast bolus timing in the pulmonary arterial tree. Mild respiratory motion. No focal filling defect identified in the pulmonary arteries to suggest acute pulmonary embolism. Negative visible aorta aside from mild atherosclerosis. Mild cardiomegaly. No pericardial effusion. Mediastinum/Nodes: Lower cervical lymphadenopathy continuing  to the thoracic inlet as seen on the face and cervical spine imaging today. There is mild to moderate mediastinal and hilar lymphadenopathy, new since 2010. Individual nodes measure up to 12 millimeter short axis. Lungs/Pleura: Major airways are patent. No pneumothorax or pleural effusion. No pulmonary contusion. Mild chronic scarring in the lingula and medial segment of the right middle lobe. There is a round 10-11 millimeter anterior basal segment left lower lobe lung nodule which was 7 millimeters in 2010. This is most likely benign. Upper Abdomen: Partially visible splenomegaly, new since 2010. Negative visible liver and bowel in the upper abdomen. Musculoskeletal: No rib or sternal fracture identified. Thoracic spine degeneration. No acute osseous abnormality identified. Review of the MIP images confirms the above findings. IMPRESSION: 1. No evidence of acute pulmonary embolus. No acute traumatic injury identified in the chest. 2. History of CLL with evidence of active disease: splenomegaly, cervical and  mediastinal lymphadenopathy. 3. No acute pulmonary abnormality. A solitary lung nodule in the left lower lobe has minimally increased from 7 to 11 mm over the past 10 years compatible with benign etiology. 4. Mild cardiomegaly. Electronically Signed   By: Genevie Ann M.D.   On: 10/15/2018 20:01   Ct Cervical Spine Wo Contrast  Result Date: 10/15/2018 CLINICAL DATA:  78 year old male status post syncope and blunt trauma. Nose and forehead lacerations. EXAM: CT HEAD WITHOUT CONTRAST CT MAXILLOFACIAL WITHOUT CONTRAST CT CERVICAL SPINE WITHOUT CONTRAST TECHNIQUE: Multidetector CT imaging of the head, cervical spine, and maxillofacial structures were performed using the standard protocol without intravenous contrast. Multiplanar CT image reconstructions of the cervical spine and maxillofacial structures were also generated. COMPARISON:  Paranasal sinus CT 04/06/2011. FINDINGS: CT HEAD FINDINGS Brain: Trace subarachnoid hemorrhage along the medial aspect of the right superior frontal gyrus on series 3, image 20 and series 5, image 21. No subdural blood or other extra-axial hemorrhage identified. No intraventricular hemorrhage. No parenchymal hemorrhagic contusion identified. Partially calcified left frontoparietal convexity 11-13 millimeter meningioma suspected (series 3, image 26 and coronal image 52). No significant mass effect. No cerebral edema. Gray-white matter differentiation is within normal limits for age. No ventriculomegaly or intracranial mass effect. No cortically based acute infarct identified. Vascular: Calcified atherosclerosis at the skull base. No suspicious intracranial vascular hyperdensity. Skull: Calvarium appears intact. See facial bone findings below. Other: Forehead scalp hematoma tracks cephalad. See facial bone findings below. Other scalp soft tissues appear negative. CT MAXILLOFACIAL FINDINGS Osseous: Comminuted and impacted bilateral anterior nasal bone fractures with associated nasal bridge  soft tissue swelling and soft tissue gas. The maxillary nasal processes remain intact. However, there is mild associated deformity of the anterior superior nasal septum. There is also abnormal soft tissue gas in and around the right medial frontoethmoidal confluence, with a possible nondisplaced fracture there on series 8, image 64. Otherwise the anterior walls of the frontal sinuses appear intact. Nondisplaced fracture versus nutrient foramen of the right mandible in the parasymphyseal region best seen on series 13, image 42. Elsewhere the mandible appears intact and normally aligned, but there is superimposed poor dentition. No maxilla or zygoma fracture. Poor maxillary dentition posteriorly. Central skull base intact. Pterygoid plates intact. Orbits: Trace gas in the anterior superior right orbit suspicious for nondisplaced lamina papyracea fracture there on series 8, image 70 and series 12, image 33. Other orbital walls are intact. Globes are intact. There is preseptal soft tissue swelling/hematoma, but no intraorbital hematoma. No other intraorbital gas. Sinuses: Trace hemorrhage layering in the right frontal sinus (  series 6, image 68) associated with the nondisplaced frontoethmoidal fractures described above. Mild right anterior ethmoid bladder mucosal thickening. Minimal to mild paranasal sinus mucosal thickening elsewhere. Small volume retained secretions in the nasal cavity. Leftward anterior nasal septal deviation may be acute and associated with the nasal bone fractures described earlier. Negative nasal cavity otherwise. Tympanic cavities and mastoids are clear. Soft tissues: Negative visible noncontrast larynx, pharynx, parapharyngeal spaces, retropharyngeal space, sublingual space, submandibular and parotid glands. There is moderate bilateral level 1, level 2, and visible level 3 lymphadenopathy with enlarged individual nodes up to 17 millimeters short axis. CT CERVICAL SPINE FINDINGS Alignment: Mild  reversal of cervical lordosis. Cervicothoracic junction alignment is within normal limits. Bilateral posterior element alignment is within normal limits. Skull base and vertebrae: Visualized skull base is intact. No atlanto-occipital dissociation. No acute osseous abnormality identified. Soft tissues and spinal canal: No prevertebral fluid or swelling. No visible canal hematoma. Generalized bilateral cervical lymphadenopathy continuing to the thoracic inlet (series 15, image 64). Disc levels: Congenital incomplete segmentation versus acquired ankylosis of C6-C7. Advanced cervical disc and endplate degeneration elsewhere. At least mild multifactorial spinal stenosis at C4-C5 and C5-C6. Upper chest: Negative lung apices. Grossly intact visible upper thoracic levels. IMPRESSION: 1. Positive for trace posttraumatic subarachnoid hemorrhage along the medial aspect of the right superior frontal gyrus. No other acute traumatic injury to the brain identified. 2. Positive for generalized cervical Lymphadenopathy continuing to the thoracic inlet. On discussion with Dr. Tyrone Nine in the ED he advises the patient has chronic lymphocytic leukemia (CLL) which explains this appearance. 3. Comminuted and impacted bilateral nasal bone fractures. Probable associated fracture of the anterior superior nasal septum. Nondisplaced fracture of the anterior right frontoethmoidal confluence suspected, with mild regional soft tissue gas. Nondisplaced fracture of the right anterior lamina papyracea with trace gas in the anterior superior right orbit. No intraorbital hematoma. 4. Probable nutrient foramen of the right mandible parasymphysis, less likely a nondisplaced fracture (series 13, image 42). 5. No acute fracture in the cervical spine. 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 Study discussed by telephone with Dr. Tyrone Nine on 10/15/2018 at 19:50 . Electronically Signed   By: Genevie Ann  M.D.   On: 10/15/2018 19:56   [name] Brain Wo Contrast (neuro Protocol)  Result Date: 10/16/2018 CLINICAL DATA:  Initial evaluation for acute syncope, bilateral hand tingling. EXAM: MRI HEAD WITHOUT CONTRAST MRI CERVICAL SPINE WITHOUT CONTRAST TECHNIQUE: Multiplanar, multiecho pulse sequences of the brain and surrounding structures, and cervical spine, to include the craniocervical junction and cervicothoracic junction, were obtained without intravenous contrast. COMPARISON:  Prior CT from 10/15/2018 FINDINGS: MRI HEAD FINDINGS Brain: Cerebral volume within normal limits. No significant cerebral white matter changes for age. No abnormal foci of restricted diffusion to suggest acute or subacute ischemia. Gray-white matter differentiation maintained. No encephalomalacia to suggest chronic cortical infarction. Scattered small volume susceptibility artifact overlying the medial aspect of the right superior frontal gyrus compatible with small volume posttraumatic subarachnoid hemorrhage, stable from prior CT. No other new or progressive intracranial hemorrhage. Trace layering blood products noted within the occipital horns of both lateral ventricles, compatible with redistribution. 13 mm meningioma overlies the parasagittal left parietal convexity (series 11, image 20). No associated edema or mass effect. No other mass lesion. No midline shift or mass effect. No hydrocephalus. No extra-axial fluid collection. Pituitary gland suprasellar region normal. Midline structures intact and normal. Vascular: Major intracranial vascular flow voids are well maintained. Skull and  upper cervical spine: Craniocervical junction normal. Bone marrow signal intensity within normal limits. Focal 17 mm T1 hypointense lesion present within the right parietal calvarium (series 9, image 6). Additional possible 2 cm lesion posteriorly within the left occipital calvarium (series 9, image 15). Findings are indeterminate. No corresponding osseous  lesion seen on prior CT. Soft tissue swelling seen at the forehead, extending inferiorly to involve the nasal bridge and nose, also seen on prior maxillofacial CT. Associated maxillofacial fractures better evaluated on prior exam. Sinuses/Orbits: Globes and orbital soft tissues within normal limits. Probable right orbital fracture better seen on prior CT. Scattered mucosal thickening throughout the ethmoidal air cells and maxillary sinuses. No significant mastoid effusion. Inner ear structures normal. Other: None. MRI CERVICAL SPINE FINDINGS Alignment: Straightening with slight reversal of the normal cervical lordosis, apex at C5-6. Trace anterolisthesis of C4 on C5, likely chronic and facet mediated. No malalignment. Vertebrae: Vertebral body heights maintained without evidence for acute or chronic fracture. C6 and C7 vertebral bodies are largely ankylosed. Underlying bone marrow signal intensity somewhat diffusely decreased on T1 weighted imaging, suspected to be related history of CLL. No discrete or worrisome osseous lesions. Cord: Signal intensity within the cervical spinal cord is normal. No evidence for traumatic cord injury or myelomalacia. Posterior Fossa, vertebral arteries, paraspinal tissues: Craniocervical junction normal. Paraspinous and prevertebral soft tissues demonstrate no acute finding. No evidence for ligamentous injury. Prominent bilateral cervical adenopathy again noted, compatible with history of CLL. Normal intravascular flow voids seen within the vertebral arteries bilaterally. Disc levels: C2-C3: Mild bilateral uncovertebral and facet hypertrophy. No significant spinal stenosis. Mild right C3 foraminal narrowing. C3-C4: Broad-based posterior disc protrusion indents the ventral thecal sac. Mild facet and ligament flavum hypertrophy. Mild spinal stenosis without cord deformity. Superimposed uncovertebral hypertrophy with resultant moderate right worse than left C4 foraminal stenosis. C4-C5:  Mild diffuse disc bulge with uncovertebral hypertrophy. Superimposed facet and ligament flavum hypertrophy. Resultant moderate spinal stenosis without significant cord deformity. Severe left with moderate right C5 foraminal narrowing. C5-C6: Diffuse circumferential disc osteophyte with intervertebral disc space narrowing. Superimposed facet and ligament flavum hypertrophy. Resultant severe spinal stenosis with mild cord flattening. No cord signal changes. Thecal sac measures 7 mm in AP diameter. Severe bilateral C6 foraminal narrowing, right worse than left. C6-C7: C6 and C7 vertebral bodies are partially ankylosed. Associated endplate osseous ridging with uncovertebral hypertrophy. Flattening of the ventral thecal sac without significant spinal stenosis. Moderate right with mild left C7 foraminal stenosis. C7-T1: Mild disc bulge with right-sided uncovertebral hypertrophy. Mild right-sided facet degeneration. No spinal stenosis. Mild right C8 foraminal stenosis. Visualized upper thoracic spine demonstrates no significant finding. IMPRESSION: MRI HEAD IMPRESSION: 1. Small volume acute posttraumatic subarachnoid hemorrhage involving the medial right frontal lobe, stable from prior CT. No evidence for new or progressive hemorrhage. 2. No other acute intracranial abnormality. 3. Focal 1-2 cm osseous lesions involving the right parietal and occipital calvarium as above, indeterminate. Findings are of uncertain significance, with no definite corresponding osseous lesion seen on prior CT. Correlation with dedicated bone scan suggested for further evaluation. 4. Soft tissue contusion involving the forehead and central face with associated facial fractures, better seen on prior CT. 5. 13 mm meningioma overlying the left parietal convexity without associated edema. MRI CERVICAL SPINE IMPRESSION: 1. No acute traumatic injury or other finding within the cervical spine status post recent syncope and fall. No evidence for spinal  cord or ligamentous injury. 2. Moderate multilevel cervical spondylolysis with resultant moderate spinal stenosis at  C4-5 and more severe narrowing at C5-6. 3. Multifactorial degenerative changes with resultant multilevel foraminal narrowing as above. Notable findings include moderate bilateral C4 foraminal stenosis, severe left with moderate right C5 foraminal narrowing, severe bilateral C6 foraminal stenosis, with moderate right C7 foraminal narrowing. 4. Prominent bulky cervical adenopathy within the visualized neck, compatible with history of CLL. Electronically Signed   By: Jeannine Boga M.D.   On: 10/16/2018 02:32   [name] Cervical Spine Wo Contrast  Result Date: 10/16/2018 CLINICAL DATA:  Initial evaluation for acute syncope, bilateral hand tingling. EXAM: MRI HEAD WITHOUT CONTRAST MRI CERVICAL SPINE WITHOUT CONTRAST TECHNIQUE: Multiplanar, multiecho pulse sequences of the brain and surrounding structures, and cervical spine, to include the craniocervical junction and cervicothoracic junction, were obtained without intravenous contrast. COMPARISON:  Prior CT from 10/15/2018 FINDINGS: MRI HEAD FINDINGS Brain: Cerebral volume within normal limits. No significant cerebral white matter changes for age. No abnormal foci of restricted diffusion to suggest acute or subacute ischemia. Gray-white matter differentiation maintained. No encephalomalacia to suggest chronic cortical infarction. Scattered small volume susceptibility artifact overlying the medial aspect of the right superior frontal gyrus compatible with small volume posttraumatic subarachnoid hemorrhage, stable from prior CT. No other new or progressive intracranial hemorrhage. Trace layering blood products noted within the occipital horns of both lateral ventricles, compatible with redistribution. 13 mm meningioma overlies the parasagittal left parietal convexity (series 11, image 20). No associated edema or mass effect. No other mass lesion. No  midline shift or mass effect. No hydrocephalus. No extra-axial fluid collection. Pituitary gland suprasellar region normal. Midline structures intact and normal. Vascular: Major intracranial vascular flow voids are well maintained. Skull and upper cervical spine: Craniocervical junction normal. Bone marrow signal intensity within normal limits. Focal 17 mm T1 hypointense lesion present within the right parietal calvarium (series 9, image 6). Additional possible 2 cm lesion posteriorly within the left occipital calvarium (series 9, image 15). Findings are indeterminate. No corresponding osseous lesion seen on prior CT. Soft tissue swelling seen at the forehead, extending inferiorly to involve the nasal bridge and nose, also seen on prior maxillofacial CT. Associated maxillofacial fractures better evaluated on prior exam. Sinuses/Orbits: Globes and orbital soft tissues within normal limits. Probable right orbital fracture better seen on prior CT. Scattered mucosal thickening throughout the ethmoidal air cells and maxillary sinuses. No significant mastoid effusion. Inner ear structures normal. Other: None. MRI CERVICAL SPINE FINDINGS Alignment: Straightening with slight reversal of the normal cervical lordosis, apex at C5-6. Trace anterolisthesis of C4 on C5, likely chronic and facet mediated. No malalignment. Vertebrae: Vertebral body heights maintained without evidence for acute or chronic fracture. C6 and C7 vertebral bodies are largely ankylosed. Underlying bone marrow signal intensity somewhat diffusely decreased on T1 weighted imaging, suspected to be related history of CLL. No discrete or worrisome osseous lesions. Cord: Signal intensity within the cervical spinal cord is normal. No evidence for traumatic cord injury or myelomalacia. Posterior Fossa, vertebral arteries, paraspinal tissues: Craniocervical junction normal. Paraspinous and prevertebral soft tissues demonstrate no acute finding. No evidence for  ligamentous injury. Prominent bilateral cervical adenopathy again noted, compatible with history of CLL. Normal intravascular flow voids seen within the vertebral arteries bilaterally. Disc levels: C2-C3: Mild bilateral uncovertebral and facet hypertrophy. No significant spinal stenosis. Mild right C3 foraminal narrowing. C3-C4: Broad-based posterior disc protrusion indents the ventral thecal sac. Mild facet and ligament flavum hypertrophy. Mild spinal stenosis without cord deformity. Superimposed uncovertebral hypertrophy with resultant moderate right worse than left C4 foraminal stenosis.  C4-C5: Mild diffuse disc bulge with uncovertebral hypertrophy. Superimposed facet and ligament flavum hypertrophy. Resultant moderate spinal stenosis without significant cord deformity. Severe left with moderate right C5 foraminal narrowing. C5-C6: Diffuse circumferential disc osteophyte with intervertebral disc space narrowing. Superimposed facet and ligament flavum hypertrophy. Resultant severe spinal stenosis with mild cord flattening. No cord signal changes. Thecal sac measures 7 mm in AP diameter. Severe bilateral C6 foraminal narrowing, right worse than left. C6-C7: C6 and C7 vertebral bodies are partially ankylosed. Associated endplate osseous ridging with uncovertebral hypertrophy. Flattening of the ventral thecal sac without significant spinal stenosis. Moderate right with mild left C7 foraminal stenosis. C7-T1: Mild disc bulge with right-sided uncovertebral hypertrophy. Mild right-sided facet degeneration. No spinal stenosis. Mild right C8 foraminal stenosis. Visualized upper thoracic spine demonstrates no significant finding. IMPRESSION: MRI HEAD IMPRESSION: 1. Small volume acute posttraumatic subarachnoid hemorrhage involving the medial right frontal lobe, stable from prior CT. No evidence for new or progressive hemorrhage. 2. No other acute intracranial abnormality. 3. Focal 1-2 cm osseous lesions involving the right  parietal and occipital calvarium as above, indeterminate. Findings are of uncertain significance, with no definite corresponding osseous lesion seen on prior CT. Correlation with dedicated bone scan suggested for further evaluation. 4. Soft tissue contusion involving the forehead and central face with associated facial fractures, better seen on prior CT. 5. 13 mm meningioma overlying the left parietal convexity without associated edema. MRI CERVICAL SPINE IMPRESSION: 1. No acute traumatic injury or other finding within the cervical spine status post recent syncope and fall. No evidence for spinal cord or ligamentous injury. 2. Moderate multilevel cervical spondylolysis with resultant moderate spinal stenosis at C4-5 and more severe narrowing at C5-6. 3. Multifactorial degenerative changes with resultant multilevel foraminal narrowing as above. Notable findings include moderate bilateral C4 foraminal stenosis, severe left with moderate right C5 foraminal narrowing, severe bilateral C6 foraminal stenosis, with moderate right C7 foraminal narrowing. 4. Prominent bulky cervical adenopathy within the visualized neck, compatible with history of CLL. Electronically Signed   By: Jeannine Boga M.D.   On: 10/16/2018 02:32   Ct Maxillofacial Wo Cm  Result Date: 10/15/2018 CLINICAL DATA:  78 year old male status post syncope and blunt trauma. Nose and forehead lacerations. EXAM: CT HEAD WITHOUT CONTRAST CT MAXILLOFACIAL WITHOUT CONTRAST CT CERVICAL SPINE WITHOUT CONTRAST TECHNIQUE: Multidetector CT imaging of the head, cervical spine, and maxillofacial structures were performed using the standard protocol without intravenous contrast. Multiplanar CT image reconstructions of the cervical spine and maxillofacial structures were also generated. COMPARISON:  Paranasal sinus CT 04/06/2011. FINDINGS: CT HEAD FINDINGS Brain: Trace subarachnoid hemorrhage along the medial aspect of the right superior frontal gyrus on series  3, image 20 and series 5, image 21. No subdural blood or other extra-axial hemorrhage identified. No intraventricular hemorrhage. No parenchymal hemorrhagic contusion identified. Partially calcified left frontoparietal convexity 11-13 millimeter meningioma suspected (series 3, image 26 and coronal image 52). No significant mass effect. No cerebral edema. Gray-white matter differentiation is within normal limits for age. No ventriculomegaly or intracranial mass effect. No cortically based acute infarct identified. Vascular: Calcified atherosclerosis at the skull base. No suspicious intracranial vascular hyperdensity. Skull: Calvarium appears intact. See facial bone findings below. Other: Forehead scalp hematoma tracks cephalad. See facial bone findings below. Other scalp soft tissues appear negative. CT MAXILLOFACIAL FINDINGS Osseous: Comminuted and impacted bilateral anterior nasal bone fractures with associated nasal bridge soft tissue swelling and soft tissue gas. The maxillary nasal processes remain intact. However, there is mild associated deformity  of the anterior superior nasal septum. There is also abnormal soft tissue gas in and around the right medial frontoethmoidal confluence, with a possible nondisplaced fracture there on series 8, image 64. Otherwise the anterior walls of the frontal sinuses appear intact. Nondisplaced fracture versus nutrient foramen of the right mandible in the parasymphyseal region best seen on series 13, image 42. Elsewhere the mandible appears intact and normally aligned, but there is superimposed poor dentition. No maxilla or zygoma fracture. Poor maxillary dentition posteriorly. Central skull base intact. Pterygoid plates intact. Orbits: Trace gas in the anterior superior right orbit suspicious for nondisplaced lamina papyracea fracture there on series 8, image 70 and series 12, image 33. Other orbital walls are intact. Globes are intact. There is preseptal soft tissue  swelling/hematoma, but no intraorbital hematoma. No other intraorbital gas. Sinuses: Trace hemorrhage layering in the right frontal sinus (series 6, image 68) associated with the nondisplaced frontoethmoidal fractures described above. Mild right anterior ethmoid bladder mucosal thickening. Minimal to mild paranasal sinus mucosal thickening elsewhere. Small volume retained secretions in the nasal cavity. Leftward anterior nasal septal deviation may be acute and associated with the nasal bone fractures described earlier. Negative nasal cavity otherwise. Tympanic cavities and mastoids are clear. Soft tissues: Negative visible noncontrast larynx, pharynx, parapharyngeal spaces, retropharyngeal space, sublingual space, submandibular and parotid glands. There is moderate bilateral level 1, level 2, and visible level 3 lymphadenopathy with enlarged individual nodes up to 17 millimeters short axis. CT CERVICAL SPINE FINDINGS Alignment: Mild reversal of cervical lordosis. Cervicothoracic junction alignment is within normal limits. Bilateral posterior element alignment is within normal limits. Skull base and vertebrae: Visualized skull base is intact. No atlanto-occipital dissociation. No acute osseous abnormality identified. Soft tissues and spinal canal: No prevertebral fluid or swelling. No visible canal hematoma. Generalized bilateral cervical lymphadenopathy continuing to the thoracic inlet (series 15, image 64). Disc levels: Congenital incomplete segmentation versus acquired ankylosis of C6-C7. Advanced cervical disc and endplate degeneration elsewhere. At least mild multifactorial spinal stenosis at C4-C5 and C5-C6. Upper chest: Negative lung apices. Grossly intact visible upper thoracic levels. IMPRESSION: 1. Positive for trace posttraumatic subarachnoid hemorrhage along the medial aspect of the right superior frontal gyrus. No other acute traumatic injury to the brain identified. 2. Positive for generalized cervical  Lymphadenopathy continuing to the thoracic inlet. On discussion with Dr. Tyrone Nine in the ED he advises the patient has chronic lymphocytic leukemia (CLL) which explains this appearance. 3. Comminuted and impacted bilateral nasal bone fractures. Probable associated fracture of the anterior superior nasal septum. Nondisplaced fracture of the anterior right frontoethmoidal confluence suspected, with mild regional soft tissue gas. Nondisplaced fracture of the right anterior lamina papyracea with trace gas in the anterior superior right orbit. No intraorbital hematoma. 4. Probable nutrient foramen of the right mandible parasymphysis, less likely a nondisplaced fracture (series 13, image 42). 5. No acute fracture in the cervical spine. 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 Study discussed by telephone with Dr. Tyrone Nine on 10/15/2018 at 19:50 . Electronically Signed   By: Genevie Ann M.D.   On: 10/15/2018 19:56    EKG     EKG: EKG was personally reviewed and demonstrates: NSR No ST-T wave abnormalities. Intervals normal. Personally reviewed  Telemetry: Telemetry was personally reviewed and demonstrates: NSR  Personally reviewed   Cardiac Imaging    Cardiac Catheterization 09/20/2012: Impressions: 1. Focal mid LAD stenosis of moderate severity between 50 and 70%. Ostial ramus of approximately  50%. Small caliber posterior descending artery and posterior lateral artery, possible vaso-reactive. Initial angiogram of left coronary system demonstrated quite significant small caliber vessels diffusely. These seem to improve with administration of intracoronary nitroglycerin. 2. Normal left ventricular systolic function.  LVEDP 14 mmHg.  Ejection fraction 65%.  Recommendations: Medical management with isosorbide for possible vaso-reactivity. He cannot use Viagra with long-acting nitrates. I will also continue low-dose beta blocker, ziac. Hopefully these 2  medications will help with overall symptoms. If symptoms worsen or become more worrisome, further evaluation with flow wire may be useful to evaluate the severity of the LAD lesion. _______________  Echocardiogram 08/19/2016: Study Conclusions: - Left ventricle: The cavity size was normal. Wall thickness was   increased in a pattern of mild LVH. Systolic function was   vigorous. The estimated ejection fraction was in the range of 65%   to 70%. Wall motion was normal; there were no regional wall   motion abnormalities. Features are consistent with a pseudonormal   left ventricular filling pattern, with concomitant abnormal   relaxation and increased filling pressure (grade 2 diastolic   dysfunction). - Aortic valve: There was trivial regurgitation. - Pulmonary arteries: Systolic pressure was mildly increased. PA   peak pressure: 44 mm Hg (S).  Impressions: - Vigorous LV systolic function; mild LVH; grade 2 diastolic   dysfunction; calcified aortic valve with trace AI; mildly   elevated LVOT velocity likely related to vigorous LV function;   trace TR; mildly elevated pulmonary pressure.  Assessment & Plan    Syncope - Patient presented for evaluation of syncopal episode which was preceded by chest tightness and shortness of breath while walking. - EKG normal without ST-T abnormalities or QT prolongation  - Initial troponin negative but repeat minimally elevated at 0.07 and 0.10. - Echo pending. - Presentation concerning for unstable angina and patient has known moderate disease of LAD. Patient would benefit from cardiac catheterization. See below. - Patient denies any palpitations prior to syncopal episode; however, could consider outpatient monitor to rule out any arrhythmias.  Unstable Angina - Patient reports progressive chest tightness and shortness of breath over the last month. Last cardiac catheterization in 2014 showed 50-70% stenosis of the mid LAD. Patient was treated with  Imdur and low-dose beta blocker at that time. Patient has not had any ischemic work-up since then. - Troponin minimally elevated and flat. Not consistent with ACS. - Echo pending. - LDL 61 and hemoglobin A1c 5.4. - Patient currently chest pain free. - Symptoms concerning for unstable angina. Ideally, patient would have cardiac catheterization for further evaluation. 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 Will discuss with MD.  Hypertension - Most recent BP 130/57. - Patient on Bisoprolol-HCTZ 10-6.25mg  [place] at home. HCTZ component was held on admission due to AKI.  AKI - Serum creatinine 1.84 on admission. Repeat 1.7 this morning. Unsure of recent baseline.  Subarachnoid Hemorrhage -  Head CT showed trace post-traumatic subarachnoid hemorrhage along the medial aspect of the right superior frontal gyrus. - Management per primary team and Neurosurgery.  Nasal Bone Fracture  - Head CT showed comminuted impacted bilateral nasal bone fractures. - ENT recommended following up as outpatient.  CLL and Thrombocytopenia - Patient reports he has stable and asymptomatic CLL and follows up with Hem-Onc ([name]) regularly.  - WBC 39.7 today. Recent baseline around 30 to 50.  -  Platelet 29 today. Recent baseline in the 30's.  SignedDarreld Mclean, PA-C 10/16/2018, 9:24 AM Pager: [phone] For questions or updates, please contact   Please consult [email or web address] for contact info under Cardiology/STEMI.  Patient seen and examined with the above-signed Advanced Practice Provider and/or Housestaff. I personally reviewed laboratory data, imaging studies and relevant notes. I independently examined the patient and formulated the important aspects of the plan. I have edited the note to reflect any of my changes or salient  points. I have personally discussed the plan with the patient and/or family.  Complicated situation. 78 y/o male with moderate CAD (by cath 2014), CLL with severe thrombocytopenia (30k) admitted with abrupt syncopal episode with severe facial trauma and small SAH in setting of recent exertional CP and SOB.   ECG is normal. Troponin minimally elevated. CT chest negative for PE. Echo EF 60-65% with no RWMAs. RV ok.   On exam General:  Sitting in bed  No resp difficulty HEENT: normal + extensive facial trauma  Neck: supple. no JVD. Carotids 2+ bilat; no bruits. No lymphadenopathy or thryomegaly appreciated. Cor: PMI nondisplaced. Regular rate & rhythm. Soft [name]  Lungs: clear Abdomen: soft, nontender, nondistended. No hepatosplenomegaly. No bruits or masses. Good bowel sounds. Extremities: no cyanosis, clubbing, rash, edema Neuro: alert & orientedx3, cranial nerves grossly intact. moves all 4 extremities w/o difficulty. Affect pleasant  By history obvious concern for unstable angina and ischemic-related VT/VF. K 3.5. Currently no arrhythmias on monitor. He will need at least a diagnostic cath to define substrate better. I spoke with Dr. Arnoldo Morale in [facility] who felt SAH is small and satble and would likely be OK for DAPT but low platelets complicate matters. Felt another 24 hours might help.   I spoke with Dr. Ammie Dalton his oncologist who felt he would be at relatively high risk for bleeding with DAPT and if he needed an intervention would prefer to treat him with steroids first to increase his platelets over a week or two before intervening if possible.   I think the best plan would be to proceed with diagnotic-only cardiac cath tomorrow to define his anatomy and if he needs an intervention we can coordinate with [name]. If no high-grade CAD to treat will likely need EP eval and outpatient monitor or ILR. Would keep on tele. Continue statin and b-blocker.   Glori Bickers, MD  11:06 AM

## 2018-10-16 NOTE — Progress Notes (Addendum)
HEMATOLOGY-ONCOLOGY PROGRESS NOTE  SUBJECTIVE: Patient admitted after having a syncopal episode yesterday and landing on his face developing facial fractures and a small right subarachnoid hemorrhage.  Neurosurgery is seeing the patient in cardiology has seen the patient. Patient states syncopal episode occurred after experiencing some intermittent chest tightness and shortness of breath. Today, he denies headaches. Reports some dizziness intermittently. No chest pain or shortness of breath today. Has facial pain from his fall. No other complaints.   REVIEW OF SYSTEMS:   Constitutional: Denies fevers, chills Eyes: Denies blurriness of vision Ears, nose, mouth, throat, and face: Denies mucositis or sore throat Respiratory: Had shortness of breath prior to syncopal episode.  Cardiovascular: Had intermittent chest discomfort prior to fall. None today.  Gastrointestinal:  Denies nausea, heartburn or change in bowel habits Skin: Has multiple bruises to his face.  Lymphatics: Has stable lymphadenopathy  Neurological: Has mild dizziness today. No headaches. Behavioral/Psych: Mood is stable, no new changes  Extremities: No lower extremity edema All other systems were reviewed with the patient and are negative.  I have reviewed the past medical history, past surgical history, social history and family history with the patient and they are unchanged from previous note.   PHYSICAL EXAMINATION:  Vitals:   10/16/18 0402 10/16/18 0739  BP: (!) 116/50 (!) 130/57  Pulse: (!) 58 (!) 58  Resp: 14 15  Temp: 98.4 F (36.9 C) 98.1 F (36.7 C)  SpO2: 100% 97%   Filed Weights   10/15/18 1731 10/16/18 0025  Weight: 164 lb (74.4 kg) 162 lb 11.2 oz (73.8 kg)    GENERAL:alert, no distress SKIN: skin color, texture, turgor are normal, no rashes or significant lesions HENT: bruising and swelling to both eyes. Multiple bruises to face. Small amount of dried blood to nostrils.  NECK: Cervical collar in  place. LUNGS: clear to auscultation and percussion with normal breathing effort HEART: regular rate & rhythm and no murmurs and no lower extremity edema ABDOMEN:abdomen soft, non-tender and normal bowel sounds Musculoskeletal:no cyanosis of digits and no clubbing  NEURO: alert & oriented x 3 with fluent speech, no focal motor/sensory deficits  LABORATORY DATA:  I have reviewed the data as listed CMP Latest Ref Rng & Units 10/16/2018 10/15/2018 10/15/2018  Glucose 70 - 99 mg/dL 94 - 101(H)  BUN 8 - 23 mg/dL 27(H) - 28(H)  Creatinine 0.61 - 1.24 mg/dL 1.70(H) 1.70(H) 1.84(H)  Sodium 135 - 145 mmol/L 138 - 139  Potassium 3.5 - 5.1 mmol/L 3.8 - 3.5  Chloride 98 - 111 mmol/L 107 - 104  CO2 22 - 32 mmol/L 20(L) - 21(L)  Calcium 8.9 - 10.3 mg/dL 8.6(L) - 9.2  Total Protein 6.5 - 8.1 g/dL - - -  Total Bilirubin 0.3 - 1.2 mg/dL - - -  Alkaline Phos 38 - 126 U/L - - -  AST 15 - 41 U/L - - -  ALT 17 - 63 U/L - - -    Lab Results  Component Value Date   WBC 39.7 (H) 10/16/2018   HGB 11.8 (L) 10/16/2018   HCT 36.1 (L) 10/16/2018   MCV 92.6 10/16/2018   PLT 29 (LL) 10/16/2018   NEUTROABS 16.6 (H) 10/15/2018    ASSESSMENT AND PLAN: 1. CLL ? Review of the peripheral blood smear is consistent with chronic lymphocytic leukemia ? 12/16/2014 Peripheral blood flow cytometry consistent with CLL ? 10/16/2018 MRI brain and cervical spine - Prominent bulky cervical adenopathy within the visualized neck, compatible with history of CLL.  2.thrombocytopenia-likely secondary to chronic lymphocytic leukemia versus "pseudo thrombocytopenia"due to platelet clumping; progressive thrombocytopenia June 2018 3.Hospitalization with sepsis/UTI with bacteremia (Enterobacter aerogenes)March 2018 4. Hospitalization for syncopal episode resulting in fall. Developed a small right subarachnoid hemorrhage.  5. MRI brain 10/16/2018 - Focal 1-2 cm osseous lesions involving the right parietal and occipital calvarium as  above, indeterminate. Findings are of uncertain significance, with no definite corresponding osseous lesion seen on prior CT.  Mr. Kornegay chart has been reviewed. He had a syncopal episode resulting in a fall and small right subarachnoid hemorrhage. He has a diagnosis of CLL and has had persistent leukocytosis, thrombocytopenia, and mild anemia.  MRI of the neck shows prominent bulky cervical adenopathy within the neck compatible with history of CLL.  MRI of the brain shows 1 to 2 cm osseous lesions involving the right parietal and occipital calvarium which are indeterminate.  1.  Leukocytosis and thrombocytopenia are overall stable.  He has bulky adenopathy in his neck.  As discussed at his last office visit, we could consider treatment with ibrutinib and acalabrutinib.  We can have further discussions about this as an outpatient. 2.  For thrombocytopenia, consider platelet transfusion if his platelet count is less than 20,000 or active bleeding. 3.  For the indeterminate lesions seen on the MRI of the brain, will discuss with [name] need for bone scan for further evaluation. 4.  Agree with cardiac work-up for syncopal episode. 5.  Subarachnoid hemorrhage is being followed by neurosurgery.  Mikey Bussing, DNP, AGPCNP-BC, AOCNP  Mr. Kem is known to me with a history of CLL, currently followed with observation.  He has chronic thrombocytopenia.  He was admitted following a syncopal event, potentially related to coronary artery disease.  He is stable from a hematologic standpoint and does not have systemic symptoms related to CLL.  The thrombocytopenia is either directly related to CLL involving the bone marrow or associated ITP.  I discussed the case with Dr. Haroldine Laws.  We can initiate systemic treatment for CLL in order to raise the platelet count to an acceptable level for him to receive antiplatelet therapy.  We can transfuse platelets and give IVIG if an acute rise in the platelet  count is needed.  The CNS hemorrhage appears stable on the MRI today and there is no apparent active bleeding at present.  I discussed the likely need for systemic therapy with Mr. Phineas Real.  We discussed steroids and systemic chemotherapy.  Recommendations: 1.  Management of subarachnoid hemorrhage per neurosurgery, transfuse platelets for increased bleeding 2.  Evaluation for coronary artery disease per cardiology 3.  Plan for close outpatient follow-up with initiation of systemic therapy for CLL within the next 1-2 weeks.

## 2018-10-16 NOTE — Progress Notes (Signed)
[facility] PROGRESS NOTE  [name] KZS:[id number] DOB: [birth year] DOA: 10/15/2018 PCP: [name], MD  Assessment/Plan: Syncope: Likely cardiac etiology.  MRI-brain did not show stroke. MRI showed a 13 mm meningioma overlying the left parietal convexity without associated edema. Pt had chest tightness and shortness of breath with exertion prior to event.  CT angiogram of chest is negative for PE.  Initial troponin negative, but the repeated troponin is +0.07. no events on tele. Echo with EF greater 65%.  No s/sx infection. No metabolic derangement. Not orthostatic.   Evaluated by cardiology who opine concern for unstable angina and recommend diagnostic cath for tomorrow  -npo past midnight  SAH (subarachnoid hemorrhage) [facility]): MRI with ligamentous injury and cervical disc disease. CT head with small right medial frontal subarachnoid hemorrhage without mass effect per neurosurgery. Patient no on anticoag but doe have CLL. -repeat CT per neurosurgery this evening given thrombocytopenia and CLL.  -monitor cbc  Fall and Nasal bone fracture: ENT, [place] was consulted by EDP--> recommended to follow-up as outpatient in the office. -Started Unasyn for prophylaxis -Pain control -Wound care consult for skin laceration   CLL (chronic lymphocytic leukemia) and thrombocytopenia: under observation currently. Evaluated by Dr Benay Spice who opined transfusing platelets for any increased bleeding. Dr Benay Spice also opined patient relatively high risk for bleeding with DAPT. Therefore plan for diagnostic cath as stated above. See cards note 10/16/18.  -cbc in am  HTN:  -change Ziac to bisoporlol and hold HCTZ due to worsening renal function -IV hydralazine prn  HLD (hyperlipidemia): -zocor  Chest tightness and elevated trop: trop 0.07 and trended up. See #1. -appreciate cards assistance  AoCKD-III: Baseline Cre is 1.2-1.35, pt's Cre is 1.84 and BUN 28 on admission. Likely due to  dehydration and continuation of diuretics, - IVF as above - Follow up renal function by BMP - Hold nephrotoxins   Code Status: full Family Communication: daughter per dr Eliseo Squires on phone Disposition Plan: home when ready   Consultants:  [facility] cards  [facility] heme/onc  Procedures:  echo Antibiotics:  One dose unasyn  HPI/Subjective: Admitted for syncope, SAH, nasal bone fracture, elevated troponin, AKI in CKD. Needs diagnostic cath tomorrow. Hx CLL. Monitor platelets. Repeat CT tonight  Objective: Vitals:   10/16/18 0739 10/16/18 1117  BP: (!) 130/57 125/60  Pulse: (!) 58 (!) 57  Resp: 15 15  Temp: 98.1 F (36.7 C) 97.6 F (36.4 C)  SpO2: 97% 92%    Intake/Output Summary (Last 24 hours) at 10/16/2018 1446 Last data filed at 10/16/2018 7322 Gross per 24 hour  Intake 243.41 ml  Output 350 ml  Net -106.59 ml   Filed Weights   10/15/18 1731 10/16/18 0025  Weight: 74.4 kg 73.8 kg    Exam:   General:  Awake alert oriented x3  Cardiovascular: rrr no mgr no LE edema  Respiratory: normal effort BS distant but clear  Abdomen: soft non-distended +BS no guarding or rebounding  Musculoskeletal: joints without swelling/erythema  Neuro: alert oriented x3. Speech clear   HEENT: periorbital swelling/bruising, nose swollen, scant blood draining left nare.  Data Reviewed: Basic Metabolic Panel: Recent Labs  Lab 10/15/18 1806 10/15/18 1815 10/16/18 0339  NA 139  --  138  K 3.5  --  3.8  CL 104  --  107  CO2 21*  --  20*  GLUCOSE 101*  --  94  BUN 28*  --  27*  CREATININE 1.84* 1.70* 1.70*  CALCIUM 9.2  --  8.6*  Liver Function Tests: No results for input(s): AST, ALT, ALKPHOS, BILITOT, PROT, ALBUMIN in the last 168 hours. No results for input(s): LIPASE, AMYLASE in the last 168 hours. No results for input(s): AMMONIA in the last 168 hours. CBC: Recent Labs  Lab 10/15/18 1806 10/16/18 0339  WBC 47.3* 39.7*  NEUTROABS 16.6*  --   HGB 12.5*  11.8*  HCT 39.1 36.1*  MCV 93.3 92.6  PLT 36* 29*   Cardiac Enzymes: Recent Labs  Lab 10/15/18 1806 10/15/18 2335 10/16/18 0339 10/16/18 0944  TROPONINI <0.03 0.07* 0.10* 0.09*   BNP (last 3 results) No results for input(s): BNP in the last 8760 hours.  ProBNP (last 3 results) No results for input(s): PROBNP in the last 8760 hours.  CBG: No results for input(s): GLUCAP in the last 168 hours.  No results found for this or any previous visit (from the past 240 hour(s)).   Studies: Ct Head Wo Contrast  Result Date: 10/15/2018 CLINICAL DATA:  78 year old male status post syncope and blunt trauma. Nose and forehead lacerations. EXAM: CT HEAD WITHOUT CONTRAST CT MAXILLOFACIAL WITHOUT CONTRAST CT CERVICAL SPINE WITHOUT CONTRAST TECHNIQUE: Multidetector CT imaging of the head, cervical spine, and maxillofacial structures were performed using the standard protocol without intravenous contrast. Multiplanar CT image reconstructions of the cervical spine and maxillofacial structures were also generated. COMPARISON:  Paranasal sinus CT 04/06/2011. FINDINGS: CT HEAD FINDINGS Brain: Trace subarachnoid hemorrhage along the medial aspect of the right superior frontal gyrus on series 3, image 20 and series 5, image 21. No subdural blood or other extra-axial hemorrhage identified. No intraventricular hemorrhage. No parenchymal hemorrhagic contusion identified. Partially calcified left frontoparietal convexity 11-13 millimeter meningioma suspected (series 3, image 26 and coronal image 52). No significant mass effect. No cerebral edema. Gray-white matter differentiation is within normal limits for age. No ventriculomegaly or intracranial mass effect. No cortically based acute infarct identified. Vascular: Calcified atherosclerosis at the skull base. No suspicious intracranial vascular hyperdensity. Skull: Calvarium appears intact. See facial bone findings below. Other: Forehead scalp hematoma tracks cephalad.  See facial bone findings below. Other scalp soft tissues appear negative. CT MAXILLOFACIAL FINDINGS Osseous: Comminuted and impacted bilateral anterior nasal bone fractures with associated nasal bridge soft tissue swelling and soft tissue gas. The maxillary nasal processes remain intact. However, there is mild associated deformity of the anterior superior nasal septum. There is also abnormal soft tissue gas in and around the right medial frontoethmoidal confluence, with a possible nondisplaced fracture there on series 8, image 64. Otherwise the anterior walls of the frontal sinuses appear intact. Nondisplaced fracture versus nutrient foramen of the right mandible in the parasymphyseal region best seen on series 13, image 42. Elsewhere the mandible appears intact and normally aligned, but there is superimposed poor dentition. No maxilla or zygoma fracture. Poor maxillary dentition posteriorly. Central skull base intact. Pterygoid plates intact. Orbits: Trace gas in the anterior superior right orbit suspicious for nondisplaced lamina papyracea fracture there on series 8, image 70 and series 12, image 33. Other orbital walls are intact. Globes are intact. There is preseptal soft tissue swelling/hematoma, but no intraorbital hematoma. No other intraorbital gas. Sinuses: Trace hemorrhage layering in the right frontal sinus (series 6, image 68) associated with the nondisplaced frontoethmoidal fractures described above. Mild right anterior ethmoid bladder mucosal thickening. Minimal to mild paranasal sinus mucosal thickening elsewhere. Small volume retained secretions in the nasal cavity. Leftward anterior nasal septal deviation may be acute and associated with the nasal bone fractures described earlier.  Negative nasal cavity otherwise. Tympanic cavities and mastoids are clear. Soft tissues: Negative visible noncontrast larynx, pharynx, parapharyngeal spaces, retropharyngeal space, sublingual space, submandibular and  parotid glands. There is moderate bilateral level 1, level 2, and visible level 3 lymphadenopathy with enlarged individual nodes up to 17 millimeters short axis. CT CERVICAL SPINE FINDINGS Alignment: Mild reversal of cervical lordosis. Cervicothoracic junction alignment is within normal limits. Bilateral posterior element alignment is within normal limits. Skull base and vertebrae: Visualized skull base is intact. No atlanto-occipital dissociation. No acute osseous abnormality identified. Soft tissues and spinal canal: No prevertebral fluid or swelling. No visible canal hematoma. Generalized bilateral cervical lymphadenopathy continuing to the thoracic inlet (series 15, image 64). Disc levels: Congenital incomplete segmentation versus acquired ankylosis of C6-C7. Advanced cervical disc and endplate degeneration elsewhere. At least mild multifactorial spinal stenosis at C4-C5 and C5-C6. Upper chest: Negative lung apices. Grossly intact visible upper thoracic levels. IMPRESSION: 1. Positive for trace posttraumatic subarachnoid hemorrhage along the medial aspect of the right superior frontal gyrus. No other acute traumatic injury to the brain identified. 2. Positive for generalized cervical Lymphadenopathy continuing to the thoracic inlet. On discussion with Dr. Tyrone Nine in the ED he advises the patient has chronic lymphocytic leukemia (CLL) which explains this appearance. 3. Comminuted and impacted bilateral nasal bone fractures. Probable associated fracture of the anterior superior nasal septum. Nondisplaced fracture of the anterior right frontoethmoidal confluence suspected, with mild regional soft tissue gas. Nondisplaced fracture of the right anterior lamina papyracea with trace gas in the anterior superior right orbit. No intraorbital hematoma. 4. Probable nutrient foramen of the right mandible parasymphysis, less likely a nondisplaced fracture (series 13, image 42). 5. No acute fracture in the cervical spine.  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 Study discussed by telephone with Dr. Tyrone Nine on 10/15/2018 at 19:50 . Electronically Signed   By: Genevie Ann M.D.   On: 10/15/2018 19:56   Ct Angio Chest Pe W And/or Wo Contrast  Result Date: 10/15/2018 CLINICAL DATA:  78 year old male status post syncope and blunt trauma. Chest tightness, shortness of breath. CLL. EXAM: CT ANGIOGRAPHY CHEST WITH CONTRAST TECHNIQUE: Multidetector CT imaging of the chest was performed using the standard protocol during bolus administration of intravenous contrast. Multiplanar CT image reconstructions and MIPs were obtained to evaluate the vascular anatomy. CONTRAST:  61mL OMNIPAQUE IOHEXOL 350 MG/ML SOLN COMPARISON:  Head face and cervical spine CT today reported separately. Chest radiographs 08/17/2016. Chest CT 03/16/2009. FINDINGS: Cardiovascular: Good contrast bolus timing in the pulmonary arterial tree. Mild respiratory motion. No focal filling defect identified in the pulmonary arteries to suggest acute pulmonary embolism. Negative visible aorta aside from mild atherosclerosis. Mild cardiomegaly. No pericardial effusion. Mediastinum/Nodes: Lower cervical lymphadenopathy continuing to the thoracic inlet as seen on the face and cervical spine imaging today. There is mild to moderate mediastinal and hilar lymphadenopathy, new since 2010. Individual nodes measure up to 12 millimeter short axis. Lungs/Pleura: Major airways are patent. No pneumothorax or pleural effusion. No pulmonary contusion. Mild chronic scarring in the lingula and medial segment of the right middle lobe. There is a round 10-11 millimeter anterior basal segment left lower lobe lung nodule which was 7 millimeters in 2010. This is most likely benign. Upper Abdomen: Partially visible splenomegaly, new since 2010. Negative visible liver and bowel in the upper abdomen. Musculoskeletal: No rib or sternal fracture  identified. Thoracic spine degeneration. No acute osseous abnormality identified. Review of the MIP images  confirms the above findings. IMPRESSION: 1. No evidence of acute pulmonary embolus. No acute traumatic injury identified in the chest. 2. History of CLL with evidence of active disease: splenomegaly, cervical and mediastinal lymphadenopathy. 3. No acute pulmonary abnormality. A solitary lung nodule in the left lower lobe has minimally increased from 7 to 11 mm over the past 10 years compatible with benign etiology. 4. Mild cardiomegaly. Electronically Signed   By: Genevie Ann M.D.   On: 10/15/2018 20:01   Ct Cervical Spine Wo Contrast  Result Date: 10/15/2018 CLINICAL DATA:  78 year old male status post syncope and blunt trauma. Nose and forehead lacerations. EXAM: CT HEAD WITHOUT CONTRAST CT MAXILLOFACIAL WITHOUT CONTRAST CT CERVICAL SPINE WITHOUT CONTRAST TECHNIQUE: Multidetector CT imaging of the head, cervical spine, and maxillofacial structures were performed using the standard protocol without intravenous contrast. Multiplanar CT image reconstructions of the cervical spine and maxillofacial structures were also generated. COMPARISON:  Paranasal sinus CT 04/06/2011. FINDINGS: CT HEAD FINDINGS Brain: Trace subarachnoid hemorrhage along the medial aspect of the right superior frontal gyrus on series 3, image 20 and series 5, image 21. No subdural blood or other extra-axial hemorrhage identified. No intraventricular hemorrhage. No parenchymal hemorrhagic contusion identified. Partially calcified left frontoparietal convexity 11-13 millimeter meningioma suspected (series 3, image 26 and coronal image 52). No significant mass effect. No cerebral edema. Gray-white matter differentiation is within normal limits for age. No ventriculomegaly or intracranial mass effect. No cortically based acute infarct identified. Vascular: Calcified atherosclerosis at the skull base. No suspicious intracranial vascular  hyperdensity. Skull: Calvarium appears intact. See facial bone findings below. Other: Forehead scalp hematoma tracks cephalad. See facial bone findings below. Other scalp soft tissues appear negative. CT MAXILLOFACIAL FINDINGS Osseous: Comminuted and impacted bilateral anterior nasal bone fractures with associated nasal bridge soft tissue swelling and soft tissue gas. The maxillary nasal processes remain intact. However, there is mild associated deformity of the anterior superior nasal septum. There is also abnormal soft tissue gas in and around the right medial frontoethmoidal confluence, with a possible nondisplaced fracture there on series 8, image 64. Otherwise the anterior walls of the frontal sinuses appear intact. Nondisplaced fracture versus nutrient foramen of the right mandible in the parasymphyseal region best seen on series 13, image 42. Elsewhere the mandible appears intact and normally aligned, but there is superimposed poor dentition. No maxilla or zygoma fracture. Poor maxillary dentition posteriorly. Central skull base intact. Pterygoid plates intact. Orbits: Trace gas in the anterior superior right orbit suspicious for nondisplaced lamina papyracea fracture there on series 8, image 70 and series 12, image 33. Other orbital walls are intact. Globes are intact. There is preseptal soft tissue swelling/hematoma, but no intraorbital hematoma. No other intraorbital gas. Sinuses: Trace hemorrhage layering in the right frontal sinus (series 6, image 68) associated with the nondisplaced frontoethmoidal fractures described above. Mild right anterior ethmoid bladder mucosal thickening. Minimal to mild paranasal sinus mucosal thickening elsewhere. Small volume retained secretions in the nasal cavity. Leftward anterior nasal septal deviation may be acute and associated with the nasal bone fractures described earlier. Negative nasal cavity otherwise. Tympanic cavities and mastoids are clear. Soft tissues:  Negative visible noncontrast larynx, pharynx, parapharyngeal spaces, retropharyngeal space, sublingual space, submandibular and parotid glands. There is moderate bilateral level 1, level 2, and visible level 3 lymphadenopathy with enlarged individual nodes up to 17 millimeters short axis. CT CERVICAL SPINE FINDINGS Alignment: Mild reversal of cervical lordosis. Cervicothoracic junction alignment is within normal limits. Bilateral posterior element alignment is  within normal limits. Skull base and vertebrae: Visualized skull base is intact. No atlanto-occipital dissociation. No acute osseous abnormality identified. Soft tissues and spinal canal: No prevertebral fluid or swelling. No visible canal hematoma. Generalized bilateral cervical lymphadenopathy continuing to the thoracic inlet (series 15, image 64). Disc levels: Congenital incomplete segmentation versus acquired ankylosis of C6-C7. Advanced cervical disc and endplate degeneration elsewhere. At least mild multifactorial spinal stenosis at C4-C5 and C5-C6. Upper chest: Negative lung apices. Grossly intact visible upper thoracic levels. IMPRESSION: 1. Positive for trace posttraumatic subarachnoid hemorrhage along the medial aspect of the right superior frontal gyrus. No other acute traumatic injury to the brain identified. 2. Positive for generalized cervical Lymphadenopathy continuing to the thoracic inlet. On discussion with Dr. Tyrone Nine in the ED he advises the patient has chronic lymphocytic leukemia (CLL) which explains this appearance. 3. Comminuted and impacted bilateral nasal bone fractures. Probable associated fracture of the anterior superior nasal septum. Nondisplaced fracture of the anterior right frontoethmoidal confluence suspected, with mild regional soft tissue gas. Nondisplaced fracture of the right anterior lamina papyracea with trace gas in the anterior superior right orbit. No intraorbital hematoma. 4. Probable nutrient foramen of the right  mandible parasymphysis, less likely a nondisplaced fracture (series 13, image 42). 5. No acute fracture in the cervical spine. 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 Study discussed by telephone with Dr. Tyrone Nine on 10/15/2018 at 19:50 . Electronically Signed   By: Genevie Ann M.D.   On: 10/15/2018 19:56   Mr Brain Wo Contrast (neuro Protocol)  Result Date: 10/16/2018 CLINICAL DATA:  Initial evaluation for acute syncope, bilateral hand tingling. EXAM: MRI HEAD WITHOUT CONTRAST MRI CERVICAL SPINE WITHOUT CONTRAST TECHNIQUE: Multiplanar, multiecho pulse sequences of the brain and surrounding structures, and cervical spine, to include the craniocervical junction and cervicothoracic junction, were obtained without intravenous contrast. COMPARISON:  Prior CT from 10/15/2018 FINDINGS: MRI HEAD FINDINGS Brain: Cerebral volume within normal limits. No significant cerebral white matter changes for age. No abnormal foci of restricted diffusion to suggest acute or subacute ischemia. Gray-white matter differentiation maintained. No encephalomalacia to suggest chronic cortical infarction. Scattered small volume susceptibility artifact overlying the medial aspect of the right superior frontal gyrus compatible with small volume posttraumatic subarachnoid hemorrhage, stable from prior CT. No other new or progressive intracranial hemorrhage. Trace layering blood products noted within the occipital horns of both lateral ventricles, compatible with redistribution. 13 mm meningioma overlies the parasagittal left parietal convexity (series 11, image 20). No associated edema or mass effect. No other mass lesion. No midline shift or mass effect. No hydrocephalus. No extra-axial fluid collection. Pituitary gland suprasellar region normal. Midline structures intact and normal. Vascular: Major intracranial vascular flow voids are well maintained. Skull and upper cervical spine:  Craniocervical junction normal. Bone marrow signal intensity within normal limits. Focal 17 mm T1 hypointense lesion present within the right parietal calvarium (series 9, image 6). Additional possible 2 cm lesion posteriorly within the left occipital calvarium (series 9, image 15). Findings are indeterminate. No corresponding osseous lesion seen on prior CT. Soft tissue swelling seen at the forehead, extending inferiorly to involve the nasal bridge and nose, also seen on prior maxillofacial CT. Associated maxillofacial fractures better evaluated on prior exam. Sinuses/Orbits: Globes and orbital soft tissues within normal limits. Probable right orbital fracture better seen on prior CT. Scattered mucosal thickening throughout the ethmoidal air cells and maxillary sinuses. No significant mastoid effusion. Inner ear structures normal. Other: None.  MRI CERVICAL SPINE FINDINGS Alignment: Straightening with slight reversal of the normal cervical lordosis, apex at C5-6. Trace anterolisthesis of C4 on C5, likely chronic and facet mediated. No malalignment. Vertebrae: Vertebral body heights maintained without evidence for acute or chronic fracture. C6 and C7 vertebral bodies are largely ankylosed. Underlying bone marrow signal intensity somewhat diffusely decreased on T1 weighted imaging, suspected to be related history of CLL. No discrete or worrisome osseous lesions. Cord: Signal intensity within the cervical spinal cord is normal. No evidence for traumatic cord injury or myelomalacia. Posterior Fossa, vertebral arteries, paraspinal tissues: Craniocervical junction normal. Paraspinous and prevertebral soft tissues demonstrate no acute finding. No evidence for ligamentous injury. Prominent bilateral cervical adenopathy again noted, compatible with history of CLL. Normal intravascular flow voids seen within the vertebral arteries bilaterally. Disc levels: C2-C3: Mild bilateral uncovertebral and facet hypertrophy. No  significant spinal stenosis. Mild right C3 foraminal narrowing. C3-C4: Broad-based posterior disc protrusion indents the ventral thecal sac. Mild facet and ligament flavum hypertrophy. Mild spinal stenosis without cord deformity. Superimposed uncovertebral hypertrophy with resultant moderate right worse than left C4 foraminal stenosis. C4-C5: Mild diffuse disc bulge with uncovertebral hypertrophy. Superimposed facet and ligament flavum hypertrophy. Resultant moderate spinal stenosis without significant cord deformity. Severe left with moderate right C5 foraminal narrowing. C5-C6: Diffuse circumferential disc osteophyte with intervertebral disc space narrowing. Superimposed facet and ligament flavum hypertrophy. Resultant severe spinal stenosis with mild cord flattening. No cord signal changes. Thecal sac measures 7 mm in AP diameter. Severe bilateral C6 foraminal narrowing, right worse than left. C6-C7: C6 and C7 vertebral bodies are partially ankylosed. Associated endplate osseous ridging with uncovertebral hypertrophy. Flattening of the ventral thecal sac without significant spinal stenosis. Moderate right with mild left C7 foraminal stenosis. C7-T1: Mild disc bulge with right-sided uncovertebral hypertrophy. Mild right-sided facet degeneration. No spinal stenosis. Mild right C8 foraminal stenosis. Visualized upper thoracic spine demonstrates no significant finding. IMPRESSION: MRI HEAD IMPRESSION: 1. Small volume acute posttraumatic subarachnoid hemorrhage involving the medial right frontal lobe, stable from prior CT. No evidence for new or progressive hemorrhage. 2. No other acute intracranial abnormality. 3. Focal 1-2 cm osseous lesions involving the right parietal and occipital calvarium as above, indeterminate. Findings are of uncertain significance, with no definite corresponding osseous lesion seen on prior CT. Correlation with dedicated bone scan suggested for further evaluation. 4. Soft tissue contusion  involving the forehead and central face with associated facial fractures, better seen on prior CT. 5. 13 mm meningioma overlying the left parietal convexity without associated edema. MRI CERVICAL SPINE IMPRESSION: 1. No acute traumatic injury or other finding within the cervical spine status post recent syncope and fall. No evidence for spinal cord or ligamentous injury. 2. Moderate multilevel cervical spondylolysis with resultant moderate spinal stenosis at C4-5 and more severe narrowing at C5-6. 3. Multifactorial degenerative changes with resultant multilevel foraminal narrowing as above. Notable findings include moderate bilateral C4 foraminal stenosis, severe left with moderate right C5 foraminal narrowing, severe bilateral C6 foraminal stenosis, with moderate right C7 foraminal narrowing. 4. Prominent bulky cervical adenopathy within the visualized neck, compatible with history of CLL. Electronically Signed   By: Jeannine Boga M.D.   On: 10/16/2018 02:32   Mr Cervical Spine Wo Contrast  Result Date: 10/16/2018 CLINICAL DATA:  Initial evaluation for acute syncope, bilateral hand tingling. EXAM: MRI HEAD WITHOUT CONTRAST MRI CERVICAL SPINE WITHOUT CONTRAST TECHNIQUE: Multiplanar, multiecho pulse sequences of the brain and surrounding structures, and cervical spine, to include the craniocervical junction and  cervicothoracic junction, were obtained without intravenous contrast. COMPARISON:  Prior CT from 10/15/2018 FINDINGS: MRI HEAD FINDINGS Brain: Cerebral volume within normal limits. No significant cerebral white matter changes for age. No abnormal foci of restricted diffusion to suggest acute or subacute ischemia. Gray-white matter differentiation maintained. No encephalomalacia to suggest chronic cortical infarction. Scattered small volume susceptibility artifact overlying the medial aspect of the right superior frontal gyrus compatible with small volume posttraumatic subarachnoid hemorrhage, stable  from prior CT. No other new or progressive intracranial hemorrhage. Trace layering blood products noted within the occipital horns of both lateral ventricles, compatible with redistribution. 13 mm meningioma overlies the parasagittal left parietal convexity (series 11, image 20). No associated edema or mass effect. No other mass lesion. No midline shift or mass effect. No hydrocephalus. No extra-axial fluid collection. Pituitary gland suprasellar region normal. Midline structures intact and normal. Vascular: Major intracranial vascular flow voids are well maintained. Skull and upper cervical spine: Craniocervical junction normal. Bone marrow signal intensity within normal limits. Focal 17 mm T1 hypointense lesion present within the right parietal calvarium (series 9, image 6). Additional possible 2 cm lesion posteriorly within the left occipital calvarium (series 9, image 15). Findings are indeterminate. No corresponding osseous lesion seen on prior CT. Soft tissue swelling seen at the forehead, extending inferiorly to involve the nasal bridge and nose, also seen on prior maxillofacial CT. Associated maxillofacial fractures better evaluated on prior exam. Sinuses/Orbits: Globes and orbital soft tissues within normal limits. Probable right orbital fracture better seen on prior CT. Scattered mucosal thickening throughout the ethmoidal air cells and maxillary sinuses. No significant mastoid effusion. Inner ear structures normal. Other: None. MRI CERVICAL SPINE FINDINGS Alignment: Straightening with slight reversal of the normal cervical lordosis, apex at C5-6. Trace anterolisthesis of C4 on C5, likely chronic and facet mediated. No malalignment. Vertebrae: Vertebral body heights maintained without evidence for acute or chronic fracture. C6 and C7 vertebral bodies are largely ankylosed. Underlying bone marrow signal intensity somewhat diffusely decreased on T1 weighted imaging, suspected to be related history of CLL. No  discrete or worrisome osseous lesions. Cord: Signal intensity within the cervical spinal cord is normal. No evidence for traumatic cord injury or myelomalacia. Posterior Fossa, vertebral arteries, paraspinal tissues: Craniocervical junction normal. Paraspinous and prevertebral soft tissues demonstrate no acute finding. No evidence for ligamentous injury. Prominent bilateral cervical adenopathy again noted, compatible with history of CLL. Normal intravascular flow voids seen within the vertebral arteries bilaterally. Disc levels: C2-C3: Mild bilateral uncovertebral and facet hypertrophy. No significant spinal stenosis. Mild right C3 foraminal narrowing. C3-C4: Broad-based posterior disc protrusion indents the ventral thecal sac. Mild facet and ligament flavum hypertrophy. Mild spinal stenosis without cord deformity. Superimposed uncovertebral hypertrophy with resultant moderate right worse than left C4 foraminal stenosis. C4-C5: Mild diffuse disc bulge with uncovertebral hypertrophy. Superimposed facet and ligament flavum hypertrophy. Resultant moderate spinal stenosis without significant cord deformity. Severe left with moderate right C5 foraminal narrowing. C5-C6: Diffuse circumferential disc osteophyte with intervertebral disc space narrowing. Superimposed facet and ligament flavum hypertrophy. Resultant severe spinal stenosis with mild cord flattening. No cord signal changes. Thecal sac measures 7 mm in AP diameter. Severe bilateral C6 foraminal narrowing, right worse than left. C6-C7: C6 and C7 vertebral bodies are partially ankylosed. Associated endplate osseous ridging with uncovertebral hypertrophy. Flattening of the ventral thecal sac without significant spinal stenosis. Moderate right with mild left C7 foraminal stenosis. C7-T1: Mild disc bulge with right-sided uncovertebral hypertrophy. Mild right-sided facet degeneration. No spinal stenosis. Mild  right C8 foraminal stenosis. Visualized upper thoracic  spine demonstrates no significant finding. IMPRESSION: MRI HEAD IMPRESSION: 1. Small volume acute posttraumatic subarachnoid hemorrhage involving the medial right frontal lobe, stable from prior CT. No evidence for new or progressive hemorrhage. 2. No other acute intracranial abnormality. 3. Focal 1-2 cm osseous lesions involving the right parietal and occipital calvarium as above, indeterminate. Findings are of uncertain significance, with no definite corresponding osseous lesion seen on prior CT. Correlation with dedicated bone scan suggested for further evaluation. 4. Soft tissue contusion involving the forehead and central face with associated facial fractures, better seen on prior CT. 5. 13 mm meningioma overlying the left parietal convexity without associated edema. MRI CERVICAL SPINE IMPRESSION: 1. No acute traumatic injury or other finding within the cervical spine status post recent syncope and fall. No evidence for spinal cord or ligamentous injury. 2. Moderate multilevel cervical spondylolysis with resultant moderate spinal stenosis at C4-5 and more severe narrowing at C5-6. 3. Multifactorial degenerative changes with resultant multilevel foraminal narrowing as above. Notable findings include moderate bilateral C4 foraminal stenosis, severe left with moderate right C5 foraminal narrowing, severe bilateral C6 foraminal stenosis, with moderate right C7 foraminal narrowing. 4. Prominent bulky cervical adenopathy within the visualized neck, compatible with history of CLL. Electronically Signed   By: Jeannine Boga M.D.   On: 10/16/2018 02:32   Ct Maxillofacial Wo Cm  Result Date: 10/15/2018 CLINICAL DATA:  78 year old male status post syncope and blunt trauma. Nose and forehead lacerations. EXAM: CT HEAD WITHOUT CONTRAST CT MAXILLOFACIAL WITHOUT CONTRAST CT CERVICAL SPINE WITHOUT CONTRAST TECHNIQUE: Multidetector CT imaging of the head, cervical spine, and maxillofacial structures were performed  using the standard protocol without intravenous contrast. Multiplanar CT image reconstructions of the cervical spine and maxillofacial structures were also generated. COMPARISON:  Paranasal sinus CT 04/06/2011. FINDINGS: CT HEAD FINDINGS Brain: Trace subarachnoid hemorrhage along the medial aspect of the right superior frontal gyrus on series 3, image 20 and series 5, image 21. No subdural blood or other extra-axial hemorrhage identified. No intraventricular hemorrhage. No parenchymal hemorrhagic contusion identified. Partially calcified left frontoparietal convexity 11-13 millimeter meningioma suspected (series 3, image 26 and coronal image 52). No significant mass effect. No cerebral edema. Gray-white matter differentiation is within normal limits for age. No ventriculomegaly or intracranial mass effect. No cortically based acute infarct identified. Vascular: Calcified atherosclerosis at the skull base. No suspicious intracranial vascular hyperdensity. Skull: Calvarium appears intact. See facial bone findings below. Other: Forehead scalp hematoma tracks cephalad. See facial bone findings below. Other scalp soft tissues appear negative. CT MAXILLOFACIAL FINDINGS Osseous: Comminuted and impacted bilateral anterior nasal bone fractures with associated nasal bridge soft tissue swelling and soft tissue gas. The maxillary nasal processes remain intact. However, there is mild associated deformity of the anterior superior nasal septum. There is also abnormal soft tissue gas in and around the right medial frontoethmoidal confluence, with a possible nondisplaced fracture there on series 8, image 64. Otherwise the anterior walls of the frontal sinuses appear intact. Nondisplaced fracture versus nutrient foramen of the right mandible in the parasymphyseal region best seen on series 13, image 42. Elsewhere the mandible appears intact and normally aligned, but there is superimposed poor dentition. No maxilla or zygoma fracture.  Poor maxillary dentition posteriorly. Central skull base intact. Pterygoid plates intact. Orbits: Trace gas in the anterior superior right orbit suspicious for nondisplaced lamina papyracea fracture there on series 8, image 70 and series 12, image 33. Other orbital walls are intact. Globes  are intact. There is preseptal soft tissue swelling/hematoma, but no intraorbital hematoma. No other intraorbital gas. Sinuses: Trace hemorrhage layering in the right frontal sinus (series 6, image 68) associated with the nondisplaced frontoethmoidal fractures described above. Mild right anterior ethmoid bladder mucosal thickening. Minimal to mild paranasal sinus mucosal thickening elsewhere. Small volume retained secretions in the nasal cavity. Leftward anterior nasal septal deviation may be acute and associated with the nasal bone fractures described earlier. Negative nasal cavity otherwise. Tympanic cavities and mastoids are clear. Soft tissues: Negative visible noncontrast larynx, pharynx, parapharyngeal spaces, retropharyngeal space, sublingual space, submandibular and parotid glands. There is moderate bilateral level 1, level 2, and visible level 3 lymphadenopathy with enlarged individual nodes up to 17 millimeters short axis. CT CERVICAL SPINE FINDINGS Alignment: Mild reversal of cervical lordosis. Cervicothoracic junction alignment is within normal limits. Bilateral posterior element alignment is within normal limits. Skull base and vertebrae: Visualized skull base is intact. No atlanto-occipital dissociation. No acute osseous abnormality identified. Soft tissues and spinal canal: No prevertebral fluid or swelling. No visible canal hematoma. Generalized bilateral cervical lymphadenopathy continuing to the thoracic inlet (series 15, image 64). Disc levels: Congenital incomplete segmentation versus acquired ankylosis of C6-C7. Advanced cervical disc and endplate degeneration elsewhere. At least mild multifactorial spinal  stenosis at C4-C5 and C5-C6. Upper chest: Negative lung apices. Grossly intact visible upper thoracic levels. IMPRESSION: 1. Positive for trace posttraumatic subarachnoid hemorrhage along the medial aspect of the right superior frontal gyrus. No other acute traumatic injury to the brain identified. 2. Positive for generalized cervical Lymphadenopathy continuing to the thoracic inlet. On discussion with Dr. Tyrone Nine in the ED he advises the patient has chronic lymphocytic leukemia (CLL) which explains this appearance. 3. Comminuted and impacted bilateral nasal bone fractures. Probable associated fracture of the anterior superior nasal septum. Nondisplaced fracture of the anterior right frontoethmoidal confluence suspected, with mild regional soft tissue gas. Nondisplaced fracture of the right anterior lamina papyracea with trace gas in the anterior superior right orbit. No intraorbital hematoma. 4. Probable nutrient foramen of the right mandible parasymphysis, less likely a nondisplaced fracture (series 13, image 42). 5. No acute fracture in the cervical spine. 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 Study discussed by telephone with Dr. Tyrone Nine on 10/15/2018 at 19:50 . Electronically Signed   By: Genevie Ann M.D.   On: 10/15/2018 19:56    Scheduled Meds: . bisoprolol  10 mg Oral Daily  . cholecalciferol  2,000 Units Oral Daily  . lidocaine-EPINEPHrine  10 mL Infiltration Once  . polymixin-bacitracin   Topical BID  . simvastatin  20 mg Oral q1800  . sodium chloride flush  3 mL Intravenous Q12H  . tamsulosin  0.4 mg Oral Daily   Continuous Infusions: . sodium chloride 75 mL/hr at 10/16/18 0225  . ampicillin-sulbactam (UNASYN) IV 1.5 g (10/16/18 0935)    Principal Problem:   Syncope Active Problems:   Chest tightness   SAH (subarachnoid hemorrhage) (HCC)   Elevated troponin   CLL (chronic lymphocytic leukemia) (HCC)   Hypertension   HLD  (hyperlipidemia)   Nasal bone fracture   Acute renal failure superimposed on stage 3 chronic kidney disease (HCC)   Thrombocytopenia ([place])    Time spent: 53 minutes    [place] NP  [facility]  If 7PM-7AM, please contact night-coverage at [email or web address], password [facility] 10/16/2018, 2:46 PM  LOS: 0 days

## 2018-10-16 NOTE — Progress Notes (Signed)
CRITICAL VALUE ALERT  Critical Value:  Platelet Count 29 K  Date & Time Notied:  10/16/18 AT 6606  Provider Notified: NP Forrest Moron  Orders Received/Actions taken: Awaiting reply

## 2018-10-16 NOTE — Progress Notes (Signed)
Patient states he will not sign procedure consent until his daughter has spoken with performing procedural physician

## 2018-10-16 NOTE — Consult Note (Signed)
WOC consult requested for face wounds.  Reviewed photos and progress notes in the EMR.  Pt had significant lacerations related to a fall which were sutured in the ER.  This is beyond the [facility] scope of practice.  Recommend follow-up with ENT or plastic surgery after discharge for removal of sutures and further recommendations.  Antibiotic ointment BID to suture area. Please re-consult if further assistance is needed.  Thank-you,  Julien Girt MSN, [place], [place], [place], [place]

## 2018-10-16 NOTE — Interval H&P Note (Signed)
History and Physical Interval Note:  10/16/2018 4:25 PM   Subarachnoid hemorrhage (post traumatic), Platelet count < 30K, and variant angina. May not be a good candidate for stent/snticoagulat/DAPT. Cath will define anatomy and help guide therapy.  DOSSIE SWOR  has presented today for surgery, with the diagnosis of chest pain - syncope.  The various methods of treatment have been discussed with the patient and family. After consideration of risks, benefits and other options for treatment, the patient has consented to  Procedure(s): LEFT HEART CATH AND CORONARY ANGIOGRAPHY (N/A) as a surgical intervention.  The patient's history has been reviewed, patient examined, no change in status, stable for surgery.  I have reviewed the patient's chart and labs.  Questions were answered to the patient's satisfaction.    Cath Lab Visit (complete for each Cath Lab visit)  Clinical Evaluation Leading to the Procedure:   ACS: Yes.    Non-ACS:    Anginal Classification: CCS III  Anti-ischemic medical therapy: Minimal Therapy (1 class of medications)  Non-Invasive Test Results: No non-invasive testing performed  Prior CABG: No previous CABG       Belva Crome III

## 2018-10-16 NOTE — Progress Notes (Signed)
Pt admitted from ED s/p fall from Syncope episode, pt alert and oriented, still c/o of headache, settled in bed with call light within pt's reach, safety precautions explained and initiated, tele monitor put and verified on pt, was however reassured and will continue to monitor, v/s stable. [name], [name]

## 2018-10-16 NOTE — Progress Notes (Signed)
CRITICAL VALUE ALERT  Critical Value: Troponin 0.07  Date & Time Notied:  10/16/18 at [place]  Provider Notified: Dr Blaine Hamper  Orders Received/Actions taken: No new orders at this time

## 2018-10-16 NOTE — Consult Note (Signed)
Reason for Consult:Traumatic subarachnoid hemorrhage, thrombocytopenia Referring Physician:  Dr. Waylan Boga [name] is an 78 y.o. male.  HPI:  The patient is a 78 year old white male who had a syncopal event yesterday landing on his face suffering facial fractures.  He was brought to the ER and head scan was obtained which demonstrated a small right subarachnoid hemorrhage.  The patient was admitted for observation and further workup of his syncopal event.  ENT has been contacted regarding his facial fractures and evidently has recommended outpatient follow-up in the office.  The patient has a history of CLL and chronic thrombocytopenia.  Presently the patient is alert and pleasant.  He complains of facial pain.  He also complains of right greater left hand numbness and weakness.  He does not take any anticoagulants. The patient has had previous lumbar surgery by Dr. Ronnald Ramp in 2014.  Past Medical History:  Diagnosis Date  . Arthritis   . Carpal tunnel syndrome, bilateral 10/09/2017  . GERD (gastroesophageal reflux disease)   . Hepatitis 1966   Drug reaction after taking medication  . Hyperlipemia   . Hypertension   . Lung nodule seen on imaging study    bilateral lungs    Past Surgical History:  Procedure Laterality Date  . APPENDECTOMY    . arthroscoyp  2000   left knee  . COLONOSCOPY W/ POLYPECTOMY    . LUMBAR LAMINECTOMY/DECOMPRESSION MICRODISCECTOMY  06/27/2012   Procedure: LUMBAR LAMINECTOMY/DECOMPRESSION MICRODISCECTOMY 1 LEVEL;  Surgeon: [name], MD;  Location: [facility];  Service: Neurosurgery;  Laterality: Bilateral;  bilateral four-five laminectony  . SKIN CANCER EXCISION  5-6 years ago   moe-s surgery- basal b  . TONSILLECTOMY      Family History  Problem Relation Age of Onset  . Sudden death Mother 58       possible botulism  . Dementia Father   . Seizures Sister     Social History:  reports that he has never smoked. He has never used smokeless tobacco.  He reports current alcohol use of about 2.0 standard drinks of alcohol per week. He reports that he does not use drugs.  Allergies: No Known Allergies  Medications:  I have reviewed the patient's current medications. Prior to Admission:  Medications Prior to Admission  Medication Sig Dispense Refill Last Dose  . bisoprolol-hydrochlorothiazide (ZIAC) 10-6.25 MG per tablet Take 1 tablet by mouth daily.   10/15/2018 at Unknown time  . Cholecalciferol (VITAMIN D) 2000 units CAPS Take 2,000 Units by mouth daily.    10/15/2018 at Unknown time  . Loratadine 10 MG CAPS Take 10 mg by mouth.   10/15/2018 at Unknown time  . simvastatin (ZOCOR) 40 MG tablet Take 20 mg by mouth daily.    10/15/2018 at Unknown time  . Tamsulosin HCl (FLOMAX) 0.4 MG CAPS Take 1 capsule (0.4 mg total) by mouth daily. 30 capsule 0 10/15/2018 at Unknown time  . EPINEPHrine (EPIPEN) 0.3 mg/0.3 mL DEVI Inject 0.3 mLs (0.3 mg total) into the muscle once. (Patient not taking: Reported on 08/17/2017) 1 Device 0 Not Taking at Unknown time   Scheduled: . bisoprolol  10 mg Oral Daily  . cholecalciferol  2,000 Units Oral Daily  . lidocaine-EPINEPHrine  10 mL Infiltration Once  . simvastatin  20 mg Oral q1800  . sodium chloride flush  3 mL Intravenous Q12H  . tamsulosin  0.4 mg Oral Daily   Continuous: . sodium chloride 75 mL/hr at 10/16/18 0225  . ampicillin-sulbactam (  UNASYN) IV 1.5 g (10/16/18 0341)   MEQ:[id number] **OR** acetaminophen, hydrALAZINE, loratadine, morphine injection, nitroGLYCERIN, ondansetron **OR** ondansetron (ZOFRAN) IV, oxyCODONE-acetaminophen, senna-docusate, zolpidem Anti-infectives (From admission, onward)   Start     Dose/Rate Route Frequency Ordered Stop   10/16/18 0330  ampicillin-sulbactam (UNASYN) 1.5 g in sodium chloride 0.9 % 100 mL IVPB     1.5 g 200 mL/hr over 30 Minutes Intravenous Every 6 hours 10/15/18 2218     10/15/18 2030  ampicillin-sulbactam (UNASYN) 1.5 g in sodium chloride 0.9 % 100 mL  IVPB     1.5 g 200 mL/hr over 30 Minutes Intravenous  Once 10/15/18 2029 10/15/18 2150       Results for orders placed or performed during the hospital encounter of 10/15/18 (from the past 48 hour(s))  Basic metabolic panel     Status: Abnormal   Collection Time: 10/15/18  6:06 PM  Result Value Ref Range   Sodium 139 135 - 145 mmol/L   Potassium 3.5 3.5 - 5.1 mmol/L   Chloride 104 98 - 111 mmol/L   CO2 21 (L) 22 - 32 mmol/L   Glucose, Bld 101 (H) 70 - 99 mg/dL   BUN 28 (H) 8 - 23 mg/dL   Creatinine, Ser 1.84 (H) 0.61 - 1.24 mg/dL   Calcium 9.2 8.9 - 10.3 mg/dL   GFR calc non Af Amer 35 (L) >60 mL/min   GFR calc Af Amer 40 (L) >60 mL/min   Anion gap 14 5 - 15    Comment: Performed at [facility], [address] [place], [place] [address]  CBC with Differential     Status: Abnormal   Collection Time: 10/15/18  6:06 PM  Result Value Ref Range   WBC 47.3 (H) 4.0 - 10.5 K/uL   RBC 4.19 (L) 4.22 - 5.81 MIL/uL   Hemoglobin 12.5 (L) 13.0 - 17.0 g/dL   HCT 39.1 39.0 - 52.0 %   MCV 93.3 80.0 - 100.0 fL   MCH 29.8 26.0 - 34.0 pg   MCHC 32.0 30.0 - 36.0 g/dL   RDW 13.4 11.5 - 15.5 %   Platelets 36 (L) 150 - 400 K/uL    Comment: REPEATED TO VERIFY PLATELET COUNT CONFIRMED BY SMEAR SPECIMEN CHECKED FOR CLOTS Immature Platelet Fraction may be clinically indicated, consider ordering this additional test [id number] CONSISTENT WITH PREVIOUS RESULT    nRBC 0.0 0.0 - 0.2 %   Neutrophils Relative % 35 %   Neutro Abs 16.6 (H) 1.7 - 7.7 K/uL   Lymphocytes Relative 60 %   Lymphs Abs 28.4 (H) 0.7 - 4.0 K/uL   Monocytes Relative 3 %   Monocytes Absolute 1.4 (H) 0.1 - 1.0 K/uL   Eosinophils Relative 1 %   Eosinophils Absolute 0.5 0.0 - 0.5 K/uL   Basophils Relative 1 %   Basophils Absolute 0.5 (H) 0.0 - 0.1 K/uL   WBC Morphology ATYPICAL LYMPHOCYTES    Abs Immature Granulocytes 0.00 0.00 - 0.07 K/uL    Comment: Performed at [facility], [place] [address], [place], [place]  [address]  Troponin I - Once     Status: None   Collection Time: 10/15/18  6:06 PM  Result Value Ref Range   Troponin I <0.03 <0.03 ng/mL    Comment: Performed at [facility] [address], [place], [place] [address]  I-stat Creatinine, ED     Status: Abnormal   Collection Time: 10/15/18  6:15 PM  Result Value Ref  Range   Creatinine, Ser 1.70 (H) 0.61 - 1.24 mg/dL  Urinalysis, Routine w reflex microscopic     Status: Abnormal   Collection Time: 10/15/18  7:46 PM  Result Value Ref Range   Color, Urine YELLOW YELLOW   APPearance CLEAR CLEAR   Specific Gravity, Urine 1.016 1.005 - 1.030   pH 6.0 5.0 - 8.0   Glucose, UA NEGATIVE NEGATIVE mg/dL   Hgb urine dipstick SMALL (A) NEGATIVE   Bilirubin Urine NEGATIVE NEGATIVE   Ketones, ur 5 (A) NEGATIVE mg/dL   Protein, ur NEGATIVE NEGATIVE mg/dL   Nitrite NEGATIVE NEGATIVE   Leukocytes,Ua NEGATIVE NEGATIVE   RBC / HPF 0-5 0 - 5 RBC/hpf   WBC, UA 0-5 0 - 5 WBC/hpf   Bacteria, UA NONE SEEN NONE SEEN   Mucus PRESENT    Hyaline Casts, UA PRESENT     Comment: Performed at [facility], [address], [place], [place] [address]  Troponin I - Now Then Q6H     Status: Abnormal   Collection Time: 10/15/18 11:35 PM  Result Value Ref Range   Troponin I 0.07 (HH) <0.03 ng/mL    Comment: CRITICAL RESULT CALLED TO, READ BACK BY AND VERIFIED WITH: asidi p,rn 10/16/18 0033 wayk Performed at [facility], [place] [address] [place], [place] [address]   Hemoglobin A1c     Status: None   Collection Time: 10/16/18  3:39 AM  Result Value Ref Range   Hgb A1c MFr Bld 5.4 4.8 - 5.6 %    Comment: (NOTE) Pre diabetes:          5.7%-6.4% Diabetes:              >6.4% Glycemic control for   <7.0% adults with diabetes    Mean Plasma Glucose 108.28 mg/dL    Comment: Performed at [facility] [address], [place], [place] [address]  Lipid panel     Status: Abnormal   Collection Time: 10/16/18  3:39 AM  Result Value Ref Range    Cholesterol 106 0 - 200 mg/dL   Triglycerides 125 <150 mg/dL   HDL 20 (L) >40 mg/dL   Total CHOL/HDL Ratio 5.3 RATIO   VLDL 25 0 - 40 mg/dL   LDL Cholesterol 61 0 - 99 mg/dL    Comment:        Total Cholesterol/HDL:CHD Risk Coronary Heart Disease Risk Table                     Men   Women  1/2 Average Risk   3.4   3.3  Average Risk       5.0   4.4  2 X Average Risk   9.6   7.1  3 X Average Risk  23.4   11.0        Use the calculated Patient Ratio above and the CHD Risk Table to determine the patient's CHD Risk.        ATP III CLASSIFICATION (LDL):  <100     mg/dL   Optimal  100-129  mg/dL   Near or Above                    Optimal  130-159  mg/dL   Borderline  160-189  mg/dL   High  >190     mg/dL   Very High Performed at [facility] [address], [place], [place] [address]   Troponin  I - Now Then Q6H     Status: Abnormal   Collection Time: 10/16/18  3:39 AM  Result Value Ref Range   Troponin I 0.10 (HH) <0.03 ng/mL    Comment: CRITICAL VALUE NOTED.  VALUE IS CONSISTENT WITH PREVIOUSLY REPORTED AND CALLED VALUE. Performed at [facility], [place] [address], [place], [place] [address]   Basic metabolic panel     Status: Abnormal   Collection Time: 10/16/18  3:39 AM  Result Value Ref Range   Sodium 138 135 - 145 mmol/L   Potassium 3.8 3.5 - 5.1 mmol/L   Chloride 107 98 - 111 mmol/L   CO2 20 (L) 22 - 32 mmol/L   Glucose, Bld 94 70 - 99 mg/dL   BUN 27 (H) 8 - 23 mg/dL   Creatinine, Ser 1.70 (H) 0.61 - 1.24 mg/dL   Calcium 8.6 (L) 8.9 - 10.3 mg/dL   GFR calc non Af Amer 38 (L) >60 mL/min   GFR calc Af Amer 44 (L) >60 mL/min   Anion gap 11 5 - 15    Comment: Performed at [facility] [address], [place], [place] [address]  CBC     Status: Abnormal   Collection Time: 10/16/18  3:39 AM  Result Value Ref Range   WBC 39.7 (H) 4.0 - 10.5 K/uL   RBC 3.90 (L) 4.22 - 5.81 MIL/uL   Hemoglobin 11.8 (L) 13.0 - 17.0 g/dL   HCT 36.1 (L) 39.0 - 52.0 %    MCV 92.6 80.0 - 100.0 fL   MCH 30.3 26.0 - 34.0 pg   MCHC 32.7 30.0 - 36.0 g/dL   RDW 13.5 11.5 - 15.5 %   Platelets 29 (LL) 150 - 400 K/uL    Comment: Immature Platelet Fraction may be clinically indicated, consider ordering this additional test [id number] THIS CRITICAL RESULT HAS VERIFIED AND BEEN CALLED TO J.FIELDMAN,RN BY MELISSA BROGDON ON 04 28 2020 AT 0503, AND HAS BEEN READ BACK.  REPEATED TO VERIFY SPECIMEN CHECKED FOR CLOTS PLATELET COUNT CONFIRMED BY SMEAR CORRECTED ON 04/28 AT 0631: PREVIOUSLY REPORTED AS 29 Immature Platelet Fraction may be clinically indicated, consider ordering this additional test [id number] THIS CRITICAL RESULT HAS VERIFIED AND BEEN CALLED TO J.FIELDMAN,RN BY MELISSA BROGDON ON 04 28  2020 AT 0503, AND HAS BEEN READ BACK.     nRBC 0.1 0.0 - 0.2 %    Comment: Performed at [facility], [place] [address], [place], [place] [address]    Ct Head Wo Contrast  Result Date: 10/15/2018 CLINICAL DATA:  78 year old male status post syncope and blunt trauma. Nose and forehead lacerations. EXAM: CT HEAD WITHOUT CONTRAST CT MAXILLOFACIAL WITHOUT CONTRAST CT CERVICAL SPINE WITHOUT CONTRAST TECHNIQUE: Multidetector CT imaging of the head, cervical spine, and maxillofacial structures were performed using the standard protocol without intravenous contrast. Multiplanar CT image reconstructions of the cervical spine and maxillofacial structures were also generated. COMPARISON:  Paranasal sinus CT 04/06/2011. FINDINGS: CT HEAD FINDINGS Brain: Trace subarachnoid hemorrhage along the medial aspect of the right superior frontal gyrus on series 3, image 20 and series 5, image 21. No subdural blood or other extra-axial hemorrhage identified. No intraventricular hemorrhage. No parenchymal hemorrhagic contusion identified. Partially calcified left frontoparietal convexity 11-13 millimeter meningioma suspected (series 3, image 26 and coronal image 52). No significant mass effect. No  cerebral edema. Gray-white matter differentiation is within normal limits for age. No ventriculomegaly or intracranial mass effect. No cortically based acute infarct identified.  Vascular: Calcified atherosclerosis at the skull base. No suspicious intracranial vascular hyperdensity. Skull: Calvarium appears intact. See facial bone findings below. Other: Forehead scalp hematoma tracks cephalad. See facial bone findings below. Other scalp soft tissues appear negative. CT MAXILLOFACIAL FINDINGS Osseous: Comminuted and impacted bilateral anterior nasal bone fractures with associated nasal bridge soft tissue swelling and soft tissue gas. The maxillary nasal processes remain intact. However, there is mild associated deformity of the anterior superior nasal septum. There is also abnormal soft tissue gas in and around the right medial frontoethmoidal confluence, with a possible nondisplaced fracture there on series 8, image 64. Otherwise the anterior walls of the frontal sinuses appear intact. Nondisplaced fracture versus nutrient foramen of the right mandible in the parasymphyseal region best seen on series 13, image 42. Elsewhere the mandible appears intact and normally aligned, but there is superimposed poor dentition. No maxilla or zygoma fracture. Poor maxillary dentition posteriorly. Central skull base intact. Pterygoid plates intact. Orbits: Trace gas in the anterior superior right orbit suspicious for nondisplaced lamina papyracea fracture there on series 8, image 70 and series 12, image 33. Other orbital walls are intact. Globes are intact. There is preseptal soft tissue swelling/hematoma, but no intraorbital hematoma. No other intraorbital gas. Sinuses: Trace hemorrhage layering in the right frontal sinus (series 6, image 68) associated with the nondisplaced frontoethmoidal fractures described above. Mild right anterior ethmoid bladder mucosal thickening. Minimal to mild paranasal sinus mucosal thickening  elsewhere. Small volume retained secretions in the nasal cavity. Leftward anterior nasal septal deviation may be acute and associated with the nasal bone fractures described earlier. Negative nasal cavity otherwise. Tympanic cavities and mastoids are clear. Soft tissues: Negative visible noncontrast larynx, pharynx, parapharyngeal spaces, retropharyngeal space, sublingual space, submandibular and parotid glands. There is moderate bilateral level 1, level 2, and visible level 3 lymphadenopathy with enlarged individual nodes up to 17 millimeters short axis. CT CERVICAL SPINE FINDINGS Alignment: Mild reversal of cervical lordosis. Cervicothoracic junction alignment is within normal limits. Bilateral posterior element alignment is within normal limits. Skull base and vertebrae: Visualized skull base is intact. No atlanto-occipital dissociation. No acute osseous abnormality identified. Soft tissues and spinal canal: No prevertebral fluid or swelling. No visible canal hematoma. Generalized bilateral cervical lymphadenopathy continuing to the thoracic inlet (series 15, image 64). Disc levels: Congenital incomplete segmentation versus acquired ankylosis of C6-C7. Advanced cervical disc and endplate degeneration elsewhere. At least mild multifactorial spinal stenosis at C4-C5 and C5-C6. Upper chest: Negative lung apices. Grossly intact visible upper thoracic levels. IMPRESSION: 1. Positive for trace posttraumatic subarachnoid hemorrhage along the medial aspect of the right superior frontal gyrus. No other acute traumatic injury to the brain identified. 2. Positive for generalized cervical Lymphadenopathy continuing to the thoracic inlet. On discussion with Dr. Tyrone Nine in the ED he advises the patient has chronic lymphocytic leukemia (CLL) which explains this appearance. 3. Comminuted and impacted bilateral nasal bone fractures. Probable associated fracture of the anterior superior nasal septum. Nondisplaced fracture of the  anterior right frontoethmoidal confluence suspected, with mild regional soft tissue gas. Nondisplaced fracture of the right anterior lamina papyracea with trace gas in the anterior superior right orbit. No intraorbital hematoma. 4. Probable nutrient foramen of the right mandible parasymphysis, less likely a nondisplaced fracture (series 13, image 42). 5. No acute fracture in the cervical spine. 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 Study discussed by telephone with Dr. Tyrone Nine on 10/15/2018 at 19:50 . Electronically Signed  By: Genevie Ann M.D.   On: 10/15/2018 19:56   Ct Angio Chest Pe W And/or Wo Contrast  Result Date: 10/15/2018 CLINICAL DATA:  78 year old male status post syncope and blunt trauma. Chest tightness, shortness of breath. CLL. EXAM: CT ANGIOGRAPHY CHEST WITH CONTRAST TECHNIQUE: Multidetector CT imaging of the chest was performed using the standard protocol during bolus administration of intravenous contrast. Multiplanar CT image reconstructions and MIPs were obtained to evaluate the vascular anatomy. CONTRAST:  20mL OMNIPAQUE IOHEXOL 350 MG/ML SOLN COMPARISON:  Head face and cervical spine CT today reported separately. Chest radiographs 08/17/2016. Chest CT 03/16/2009. FINDINGS: Cardiovascular: Good contrast bolus timing in the pulmonary arterial tree. Mild respiratory motion. No focal filling defect identified in the pulmonary arteries to suggest acute pulmonary embolism. Negative visible aorta aside from mild atherosclerosis. Mild cardiomegaly. No pericardial effusion. Mediastinum/Nodes: Lower cervical lymphadenopathy continuing to the thoracic inlet as seen on the face and cervical spine imaging today. There is mild to moderate mediastinal and hilar lymphadenopathy, new since 2010. Individual nodes measure up to 12 millimeter short axis. Lungs/Pleura: Major airways are patent. No pneumothorax or pleural effusion. No pulmonary  contusion. Mild chronic scarring in the lingula and medial segment of the right middle lobe. There is a round 10-11 millimeter anterior basal segment left lower lobe lung nodule which was 7 millimeters in 2010. This is most likely benign. Upper Abdomen: Partially visible splenomegaly, new since 2010. Negative visible liver and bowel in the upper abdomen. Musculoskeletal: No rib or sternal fracture identified. Thoracic spine degeneration. No acute osseous abnormality identified. Review of the MIP images confirms the above findings. IMPRESSION: 1. No evidence of acute pulmonary embolus. No acute traumatic injury identified in the chest. 2. History of CLL with evidence of active disease: splenomegaly, cervical and mediastinal lymphadenopathy. 3. No acute pulmonary abnormality. A solitary lung nodule in the left lower lobe has minimally increased from 7 to 11 mm over the past 10 years compatible with benign etiology. 4. Mild cardiomegaly. Electronically Signed   By: Genevie Ann M.D.   On: 10/15/2018 20:01   Ct Cervical Spine Wo Contrast  Result Date: 10/15/2018 CLINICAL DATA:  78 year old male status post syncope and blunt trauma. Nose and forehead lacerations. EXAM: CT HEAD WITHOUT CONTRAST CT MAXILLOFACIAL WITHOUT CONTRAST CT CERVICAL SPINE WITHOUT CONTRAST TECHNIQUE: Multidetector CT imaging of the head, cervical spine, and maxillofacial structures were performed using the standard protocol without intravenous contrast. Multiplanar CT image reconstructions of the cervical spine and maxillofacial structures were also generated. COMPARISON:  Paranasal sinus CT 04/06/2011. FINDINGS: CT HEAD FINDINGS Brain: Trace subarachnoid hemorrhage along the medial aspect of the right superior frontal gyrus on series 3, image 20 and series 5, image 21. No subdural blood or other extra-axial hemorrhage identified. No intraventricular hemorrhage. No parenchymal hemorrhagic contusion identified. Partially calcified left frontoparietal  convexity 11-13 millimeter meningioma suspected (series 3, image 26 and coronal image 52). No significant mass effect. No cerebral edema. Gray-white matter differentiation is within normal limits for age. No ventriculomegaly or intracranial mass effect. No cortically based acute infarct identified. Vascular: Calcified atherosclerosis at the skull base. No suspicious intracranial vascular hyperdensity. Skull: Calvarium appears intact. See facial bone findings below. Other: Forehead scalp hematoma tracks cephalad. See facial bone findings below. Other scalp soft tissues appear negative. CT MAXILLOFACIAL FINDINGS Osseous: Comminuted and impacted bilateral anterior nasal bone fractures with associated nasal bridge soft tissue swelling and soft tissue gas. The maxillary nasal processes remain intact. However, there is mild associated deformity  of the anterior superior nasal septum. There is also abnormal soft tissue gas in and around the right medial frontoethmoidal confluence, with a possible nondisplaced fracture there on series 8, image 64. Otherwise the anterior walls of the frontal sinuses appear intact. Nondisplaced fracture versus nutrient foramen of the right mandible in the parasymphyseal region best seen on series 13, image 42. Elsewhere the mandible appears intact and normally aligned, but there is superimposed poor dentition. No maxilla or zygoma fracture. Poor maxillary dentition posteriorly. Central skull base intact. Pterygoid plates intact. Orbits: Trace gas in the anterior superior right orbit suspicious for nondisplaced lamina papyracea fracture there on series 8, image 70 and series 12, image 33. Other orbital walls are intact. Globes are intact. There is preseptal soft tissue swelling/hematoma, but no intraorbital hematoma. No other intraorbital gas. Sinuses: Trace hemorrhage layering in the right frontal sinus (series 6, image 68) associated with the nondisplaced frontoethmoidal fractures described  above. Mild right anterior ethmoid bladder mucosal thickening. Minimal to mild paranasal sinus mucosal thickening elsewhere. Small volume retained secretions in the nasal cavity. Leftward anterior nasal septal deviation may be acute and associated with the nasal bone fractures described earlier. Negative nasal cavity otherwise. Tympanic cavities and mastoids are clear. Soft tissues: Negative visible noncontrast larynx, pharynx, parapharyngeal spaces, retropharyngeal space, sublingual space, submandibular and parotid glands. There is moderate bilateral level 1, level 2, and visible level 3 lymphadenopathy with enlarged individual nodes up to 17 millimeters short axis. CT CERVICAL SPINE FINDINGS Alignment: Mild reversal of cervical lordosis. Cervicothoracic junction alignment is within normal limits. Bilateral posterior element alignment is within normal limits. Skull base and vertebrae: Visualized skull base is intact. No atlanto-occipital dissociation. No acute osseous abnormality identified. Soft tissues and spinal canal: No prevertebral fluid or swelling. No visible canal hematoma. Generalized bilateral cervical lymphadenopathy continuing to the thoracic inlet (series 15, image 64). Disc levels: Congenital incomplete segmentation versus acquired ankylosis of C6-C7. Advanced cervical disc and endplate degeneration elsewhere. At least mild multifactorial spinal stenosis at C4-C5 and C5-C6. Upper chest: Negative lung apices. Grossly intact visible upper thoracic levels. IMPRESSION: 1. Positive for trace posttraumatic subarachnoid hemorrhage along the medial aspect of the right superior frontal gyrus. No other acute traumatic injury to the brain identified. 2. Positive for generalized cervical Lymphadenopathy continuing to the thoracic inlet. On discussion with Dr. Tyrone Nine in the ED he advises the patient has chronic lymphocytic leukemia (CLL) which explains this appearance. 3. Comminuted and impacted bilateral nasal  bone fractures. Probable associated fracture of the anterior superior nasal septum. Nondisplaced fracture of the anterior right frontoethmoidal confluence suspected, with mild regional soft tissue gas. Nondisplaced fracture of the right anterior lamina papyracea with trace gas in the anterior superior right orbit. No intraorbital hematoma. 4. Probable nutrient foramen of the right mandible parasymphysis, less likely a nondisplaced fracture (series 13, image 42). 5. No acute fracture in the cervical spine. 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 Study discussed by telephone with Dr. Tyrone Nine on 10/15/2018 at 19:50 . Electronically Signed   By: Genevie Ann M.D.   On: 10/15/2018 19:56   Mr Brain Wo Contrast (neuro Protocol)  Result Date: 10/16/2018 CLINICAL DATA:  Initial evaluation for acute syncope, bilateral hand tingling. EXAM: MRI HEAD WITHOUT CONTRAST MRI CERVICAL SPINE WITHOUT CONTRAST TECHNIQUE: Multiplanar, multiecho pulse sequences of the brain and surrounding structures, and cervical spine, to include the craniocervical junction and cervicothoracic junction, were obtained without intravenous contrast. COMPARISON:  Prior  CT from 10/15/2018 FINDINGS: MRI HEAD FINDINGS Brain: Cerebral volume within normal limits. No significant cerebral white matter changes for age. No abnormal foci of restricted diffusion to suggest acute or subacute ischemia. Gray-white matter differentiation maintained. No encephalomalacia to suggest chronic cortical infarction. Scattered small volume susceptibility artifact overlying the medial aspect of the right superior frontal gyrus compatible with small volume posttraumatic subarachnoid hemorrhage, stable from prior CT. No other new or progressive intracranial hemorrhage. Trace layering blood products noted within the occipital horns of both lateral ventricles, compatible with redistribution. 13 mm meningioma overlies the  parasagittal left parietal convexity (series 11, image 20). No associated edema or mass effect. No other mass lesion. No midline shift or mass effect. No hydrocephalus. No extra-axial fluid collection. Pituitary gland suprasellar region normal. Midline structures intact and normal. Vascular: Major intracranial vascular flow voids are well maintained. Skull and upper cervical spine: Craniocervical junction normal. Bone marrow signal intensity within normal limits. Focal 17 mm T1 hypointense lesion present within the right parietal calvarium (series 9, image 6). Additional possible 2 cm lesion posteriorly within the left occipital calvarium (series 9, image 15). Findings are indeterminate. No corresponding osseous lesion seen on prior CT. Soft tissue swelling seen at the forehead, extending inferiorly to involve the nasal bridge and nose, also seen on prior maxillofacial CT. Associated maxillofacial fractures better evaluated on prior exam. Sinuses/Orbits: Globes and orbital soft tissues within normal limits. Probable right orbital fracture better seen on prior CT. Scattered mucosal thickening throughout the ethmoidal air cells and maxillary sinuses. No significant mastoid effusion. Inner ear structures normal. Other: None. MRI CERVICAL SPINE FINDINGS Alignment: Straightening with slight reversal of the normal cervical lordosis, apex at C5-6. Trace anterolisthesis of C4 on C5, likely chronic and facet mediated. No malalignment. Vertebrae: Vertebral body heights maintained without evidence for acute or chronic fracture. C6 and C7 vertebral bodies are largely ankylosed. Underlying bone marrow signal intensity somewhat diffusely decreased on T1 weighted imaging, suspected to be related history of CLL. No discrete or worrisome osseous lesions. Cord: Signal intensity within the cervical spinal cord is normal. No evidence for traumatic cord injury or myelomalacia. Posterior Fossa, vertebral arteries, paraspinal tissues:  Craniocervical junction normal. Paraspinous and prevertebral soft tissues demonstrate no acute finding. No evidence for ligamentous injury. Prominent bilateral cervical adenopathy again noted, compatible with history of CLL. Normal intravascular flow voids seen within the vertebral arteries bilaterally. Disc levels: C2-C3: Mild bilateral uncovertebral and facet hypertrophy. No significant spinal stenosis. Mild right C3 foraminal narrowing. C3-C4: Broad-based posterior disc protrusion indents the ventral thecal sac. Mild facet and ligament flavum hypertrophy. Mild spinal stenosis without cord deformity. Superimposed uncovertebral hypertrophy with resultant moderate right worse than left C4 foraminal stenosis. C4-C5: Mild diffuse disc bulge with uncovertebral hypertrophy. Superimposed facet and ligament flavum hypertrophy. Resultant moderate spinal stenosis without significant cord deformity. Severe left with moderate right C5 foraminal narrowing. C5-C6: Diffuse circumferential disc osteophyte with intervertebral disc space narrowing. Superimposed facet and ligament flavum hypertrophy. Resultant severe spinal stenosis with mild cord flattening. No cord signal changes. Thecal sac measures 7 mm in AP diameter. Severe bilateral C6 foraminal narrowing, right worse than left. C6-C7: C6 and C7 vertebral bodies are partially ankylosed. Associated endplate osseous ridging with uncovertebral hypertrophy. Flattening of the ventral thecal sac without significant spinal stenosis. Moderate right with mild left C7 foraminal stenosis. C7-T1: Mild disc bulge with right-sided uncovertebral hypertrophy. Mild right-sided facet degeneration. No spinal stenosis. Mild right C8 foraminal stenosis. Visualized upper thoracic spine demonstrates no  significant finding. IMPRESSION: MRI HEAD IMPRESSION: 1. Small volume acute posttraumatic subarachnoid hemorrhage involving the medial right frontal lobe, stable from prior CT. No evidence for new or  progressive hemorrhage. 2. No other acute intracranial abnormality. 3. Focal 1-2 cm osseous lesions involving the right parietal and occipital calvarium as above, indeterminate. Findings are of uncertain significance, with no definite corresponding osseous lesion seen on prior CT. Correlation with dedicated bone scan suggested for further evaluation. 4. Soft tissue contusion involving the forehead and central face with associated facial fractures, better seen on prior CT. 5. 13 mm meningioma overlying the left parietal convexity without associated edema. MRI CERVICAL SPINE IMPRESSION: 1. No acute traumatic injury or other finding within the cervical spine status post recent syncope and fall. No evidence for spinal cord or ligamentous injury. 2. Moderate multilevel cervical spondylolysis with resultant moderate spinal stenosis at C4-5 and more severe narrowing at C5-6. 3. Multifactorial degenerative changes with resultant multilevel foraminal narrowing as above. Notable findings include moderate bilateral C4 foraminal stenosis, severe left with moderate right C5 foraminal narrowing, severe bilateral C6 foraminal stenosis, with moderate right C7 foraminal narrowing. 4. Prominent bulky cervical adenopathy within the visualized neck, compatible with history of CLL. Electronically Signed   By: Jeannine Boga M.D.   On: 10/16/2018 02:32   Mr Cervical Spine Wo Contrast  Result Date: 10/16/2018 CLINICAL DATA:  Initial evaluation for acute syncope, bilateral hand tingling. EXAM: MRI HEAD WITHOUT CONTRAST MRI CERVICAL SPINE WITHOUT CONTRAST TECHNIQUE: Multiplanar, multiecho pulse sequences of the brain and surrounding structures, and cervical spine, to include the craniocervical junction and cervicothoracic junction, were obtained without intravenous contrast. COMPARISON:  Prior CT from 10/15/2018 FINDINGS: MRI HEAD FINDINGS Brain: Cerebral volume within normal limits. No significant cerebral white matter changes  for age. No abnormal foci of restricted diffusion to suggest acute or subacute ischemia. Gray-white matter differentiation maintained. No encephalomalacia to suggest chronic cortical infarction. Scattered small volume susceptibility artifact overlying the medial aspect of the right superior frontal gyrus compatible with small volume posttraumatic subarachnoid hemorrhage, stable from prior CT. No other new or progressive intracranial hemorrhage. Trace layering blood products noted within the occipital horns of both lateral ventricles, compatible with redistribution. 13 mm meningioma overlies the parasagittal left parietal convexity (series 11, image 20). No associated edema or mass effect. No other mass lesion. No midline shift or mass effect. No hydrocephalus. No extra-axial fluid collection. Pituitary gland suprasellar region normal. Midline structures intact and normal. Vascular: Major intracranial vascular flow voids are well maintained. Skull and upper cervical spine: Craniocervical junction normal. Bone marrow signal intensity within normal limits. Focal 17 mm T1 hypointense lesion present within the right parietal calvarium (series 9, image 6). Additional possible 2 cm lesion posteriorly within the left occipital calvarium (series 9, image 15). Findings are indeterminate. No corresponding osseous lesion seen on prior CT. Soft tissue swelling seen at the forehead, extending inferiorly to involve the nasal bridge and nose, also seen on prior maxillofacial CT. Associated maxillofacial fractures better evaluated on prior exam. Sinuses/Orbits: Globes and orbital soft tissues within normal limits. Probable right orbital fracture better seen on prior CT. Scattered mucosal thickening throughout the ethmoidal air cells and maxillary sinuses. No significant mastoid effusion. Inner ear structures normal. Other: None. MRI CERVICAL SPINE FINDINGS Alignment: Straightening with slight reversal of the normal cervical  lordosis, apex at C5-6. Trace anterolisthesis of C4 on C5, likely chronic and facet mediated. No malalignment. Vertebrae: Vertebral body heights maintained without evidence for acute or chronic  fracture. C6 and C7 vertebral bodies are largely ankylosed. Underlying bone marrow signal intensity somewhat diffusely decreased on T1 weighted imaging, suspected to be related history of CLL. No discrete or worrisome osseous lesions. Cord: Signal intensity within the cervical spinal cord is normal. No evidence for traumatic cord injury or myelomalacia. Posterior Fossa, vertebral arteries, paraspinal tissues: Craniocervical junction normal. Paraspinous and prevertebral soft tissues demonstrate no acute finding. No evidence for ligamentous injury. Prominent bilateral cervical adenopathy again noted, compatible with history of CLL. Normal intravascular flow voids seen within the vertebral arteries bilaterally. Disc levels: C2-C3: Mild bilateral uncovertebral and facet hypertrophy. No significant spinal stenosis. Mild right C3 foraminal narrowing. C3-C4: Broad-based posterior disc protrusion indents the ventral thecal sac. Mild facet and ligament flavum hypertrophy. Mild spinal stenosis without cord deformity. Superimposed uncovertebral hypertrophy with resultant moderate right worse than left C4 foraminal stenosis. C4-C5: Mild diffuse disc bulge with uncovertebral hypertrophy. Superimposed facet and ligament flavum hypertrophy. Resultant moderate spinal stenosis without significant cord deformity. Severe left with moderate right C5 foraminal narrowing. C5-C6: Diffuse circumferential disc osteophyte with intervertebral disc space narrowing. Superimposed facet and ligament flavum hypertrophy. Resultant severe spinal stenosis with mild cord flattening. No cord signal changes. Thecal sac measures 7 mm in AP diameter. Severe bilateral C6 foraminal narrowing, right worse than left. C6-C7: C6 and C7 vertebral bodies are partially  ankylosed. Associated endplate osseous ridging with uncovertebral hypertrophy. Flattening of the ventral thecal sac without significant spinal stenosis. Moderate right with mild left C7 foraminal stenosis. C7-T1: Mild disc bulge with right-sided uncovertebral hypertrophy. Mild right-sided facet degeneration. No spinal stenosis. Mild right C8 foraminal stenosis. Visualized upper thoracic spine demonstrates no significant finding. IMPRESSION: MRI HEAD IMPRESSION: 1. Small volume acute posttraumatic subarachnoid hemorrhage involving the medial right frontal lobe, stable from prior CT. No evidence for new or progressive hemorrhage. 2. No other acute intracranial abnormality. 3. Focal 1-2 cm osseous lesions involving the right parietal and occipital calvarium as above, indeterminate. Findings are of uncertain significance, with no definite corresponding osseous lesion seen on prior CT. Correlation with dedicated bone scan suggested for further evaluation. 4. Soft tissue contusion involving the forehead and central face with associated facial fractures, better seen on prior CT. 5. 13 mm meningioma overlying the left parietal convexity without associated edema. MRI CERVICAL SPINE IMPRESSION: 1. No acute traumatic injury or other finding within the cervical spine status post recent syncope and fall. No evidence for spinal cord or ligamentous injury. 2. Moderate multilevel cervical spondylolysis with resultant moderate spinal stenosis at C4-5 and more severe narrowing at C5-6. 3. Multifactorial degenerative changes with resultant multilevel foraminal narrowing as above. Notable findings include moderate bilateral C4 foraminal stenosis, severe left with moderate right C5 foraminal narrowing, severe bilateral C6 foraminal stenosis, with moderate right C7 foraminal narrowing. 4. Prominent bulky cervical adenopathy within the visualized neck, compatible with history of CLL. Electronically Signed   By: Jeannine Boga M.D.    On: 10/16/2018 02:32   Ct Maxillofacial Wo Cm  Result Date: 10/15/2018 CLINICAL DATA:  78 year old male status post syncope and blunt trauma. Nose and forehead lacerations. EXAM: CT HEAD WITHOUT CONTRAST CT MAXILLOFACIAL WITHOUT CONTRAST CT CERVICAL SPINE WITHOUT CONTRAST TECHNIQUE: Multidetector CT imaging of the head, cervical spine, and maxillofacial structures were performed using the standard protocol without intravenous contrast. Multiplanar CT image reconstructions of the cervical spine and maxillofacial structures were also generated. COMPARISON:  Paranasal sinus CT 04/06/2011. FINDINGS: CT HEAD FINDINGS Brain: Trace subarachnoid hemorrhage along the medial aspect of  the right superior frontal gyrus on series 3, image 20 and series 5, image 21. No subdural blood or other extra-axial hemorrhage identified. No intraventricular hemorrhage. No parenchymal hemorrhagic contusion identified. Partially calcified left frontoparietal convexity 11-13 millimeter meningioma suspected (series 3, image 26 and coronal image 52). No significant mass effect. No cerebral edema. Gray-white matter differentiation is within normal limits for age. No ventriculomegaly or intracranial mass effect. No cortically based acute infarct identified. Vascular: Calcified atherosclerosis at the skull base. No suspicious intracranial vascular hyperdensity. Skull: Calvarium appears intact. See facial bone findings below. Other: Forehead scalp hematoma tracks cephalad. See facial bone findings below. Other scalp soft tissues appear negative. CT MAXILLOFACIAL FINDINGS Osseous: Comminuted and impacted bilateral anterior nasal bone fractures with associated nasal bridge soft tissue swelling and soft tissue gas. The maxillary nasal processes remain intact. However, there is mild associated deformity of the anterior superior nasal septum. There is also abnormal soft tissue gas in and around the right medial frontoethmoidal confluence, with a  possible nondisplaced fracture there on series 8, image 64. Otherwise the anterior walls of the frontal sinuses appear intact. Nondisplaced fracture versus nutrient foramen of the right mandible in the parasymphyseal region best seen on series 13, image 42. Elsewhere the mandible appears intact and normally aligned, but there is superimposed poor dentition. No maxilla or zygoma fracture. Poor maxillary dentition posteriorly. Central skull base intact. Pterygoid plates intact. Orbits: Trace gas in the anterior superior right orbit suspicious for nondisplaced lamina papyracea fracture there on series 8, image 70 and series 12, image 33. Other orbital walls are intact. Globes are intact. There is preseptal soft tissue swelling/hematoma, but no intraorbital hematoma. No other intraorbital gas. Sinuses: Trace hemorrhage layering in the right frontal sinus (series 6, image 68) associated with the nondisplaced frontoethmoidal fractures described above. Mild right anterior ethmoid bladder mucosal thickening. Minimal to mild paranasal sinus mucosal thickening elsewhere. Small volume retained secretions in the nasal cavity. Leftward anterior nasal septal deviation may be acute and associated with the nasal bone fractures described earlier. Negative nasal cavity otherwise. Tympanic cavities and mastoids are clear. Soft tissues: Negative visible noncontrast larynx, pharynx, parapharyngeal spaces, retropharyngeal space, sublingual space, submandibular and parotid glands. There is moderate bilateral level 1, level 2, and visible level 3 lymphadenopathy with enlarged individual nodes up to 17 millimeters short axis. CT CERVICAL SPINE FINDINGS Alignment: Mild reversal of cervical lordosis. Cervicothoracic junction alignment is within normal limits. Bilateral posterior element alignment is within normal limits. Skull base and vertebrae: Visualized skull base is intact. No atlanto-occipital dissociation. No acute osseous abnormality  identified. Soft tissues and spinal canal: No prevertebral fluid or swelling. No visible canal hematoma. Generalized bilateral cervical lymphadenopathy continuing to the thoracic inlet (series 15, image 64). Disc levels: Congenital incomplete segmentation versus acquired ankylosis of C6-C7. Advanced cervical disc and endplate degeneration elsewhere. At least mild multifactorial spinal stenosis at C4-C5 and C5-C6. Upper chest: Negative lung apices. Grossly intact visible upper thoracic levels. IMPRESSION: 1. Positive for trace posttraumatic subarachnoid hemorrhage along the medial aspect of the right superior frontal gyrus. No other acute traumatic injury to the brain identified. 2. Positive for generalized cervical Lymphadenopathy continuing to the thoracic inlet. On discussion with Dr. Tyrone Nine in the ED he advises the patient has chronic lymphocytic leukemia (CLL) which explains this appearance. 3. Comminuted and impacted bilateral nasal bone fractures. Probable associated fracture of the anterior superior nasal septum. Nondisplaced fracture of the anterior right frontoethmoidal confluence suspected, with mild regional soft tissue gas.  Nondisplaced fracture of the right anterior lamina papyracea with trace gas in the anterior superior right orbit. No intraorbital hematoma. 4. Probable nutrient foramen of the right mandible parasymphysis, less likely a nondisplaced fracture (series 13, image 42). 5. No acute fracture in the cervical spine. 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 Study discussed by telephone with Dr. Tyrone Nine on 10/15/2018 at 19:50 . Electronically Signed   By: Genevie Ann M.D.   On: 10/15/2018 19:56    ROS : As above.  He denies neck pain. Blood pressure (!) 130/57, pulse (!) 58, temperature 98.1 F (36.7 C), temperature source Oral, resp. rate 15, height 5\' 3"  (1.6 m), weight 73.8 kg, SpO2 97 %. Estimated body mass index is 28.82 kg/m as  calculated from the following:   Height as of this encounter: 5\' 3"  (1.6 m).   Weight as of this encounter: 73.8 kg.  Physical Exam   General: An alert and pleasant 78 year old white male in a [place] collar with facial fractures.    HEENT:  The patient's pupils are equal and reactive, extraocular muscles are intact.  He has bloody drainage from his nose.  Neck:  The patient is in a cervical collar.  There is no obvious deformities.  Thorax: Symmetric  Abdomen: Soft  Extremities called unremarkable  Neurologic exam:  The patient is alert and oriented x3.  Cranial nerves 2-12 were examine bilaterally in grossly normal.  Vision and hearing are grossly normal bilaterally.  The patient's motor strength is normal in his biceps, deltoid, gastrocnemius, and dorsiflexors.  He has slight weakness in his bilateral hand grip.  He has some numbness in his bilateral hands.  Cerebellar function is intact to rapid alternating movements of the upper extremities bilaterally.  Imaging studies I have reviewed the patient's cervical CT.  He has a straightened cervical spine with multilevel spondylosis.  I do not see any fractures.    I have also reviewed the patient's head CT performed at [facility] yesterday.  He has a small right medial frontal subarachnoid hemorrhage without mass effect.  Assessment/Plan:   Traumatic subarachnoid hemorrhage, thrombocytopenia:  I would plan to repeat his CT scan today given his thrombocytopenia and CLL.  Clinically he seems to be doing fine.    Cervical spondylosis, hand numbness:  Think he will need an outpatient workup of this to rule out significant cervical stenosis.  The patient has had previous lumbar surgery by Dr. Ronnald Ramp  and can follow up with him in the office.  Ophelia Charter 10/16/2018, 8:07 AM

## 2018-10-17 ENCOUNTER — Encounter (HOSPITAL_COMMUNITY)
Admission: EM | Disposition: A | Discharge status: Home / Self Care | Payer: Self-pay | Source: Home / Self Care | Attending: Internal Medicine

## 2018-10-17 ENCOUNTER — Inpatient Hospital Stay (HOSPITAL_COMMUNITY): Payer: Medicare Other

## 2018-10-17 DIAGNOSIS — I200 Unstable angina: Secondary | ICD-10-CM

## 2018-10-17 DIAGNOSIS — R55 Syncope and collapse: Secondary | ICD-10-CM

## 2018-10-17 DIAGNOSIS — R079 Chest pain, unspecified: Secondary | ICD-10-CM

## 2018-10-17 LAB — CBC
HCT: 33.7 % — ABNORMAL LOW (ref 39.0–52.0)
Hemoglobin: 10.6 g/dL — ABNORMAL LOW (ref 13.0–17.0)
MCH: 29.6 pg (ref 26.0–34.0)
MCHC: 31.5 g/dL (ref 30.0–36.0)
MCV: 94.1 fL (ref 80.0–100.0)
Platelets: 24 10*3/uL — CL (ref 150–400)
RBC: 3.58 MIL/uL — ABNORMAL LOW (ref 4.22–5.81)
RDW: 13.7 % (ref 11.5–15.5)
WBC: 32.8 10*3/uL — ABNORMAL HIGH (ref 4.0–10.5)
nRBC: 0.2 % (ref 0.0–0.2)

## 2018-10-17 LAB — BASIC METABOLIC PANEL
Anion gap: 7 (ref 5–15)
BUN: 23 mg/dL (ref 8–23)
CO2: 20 mmol/L — ABNORMAL LOW (ref 22–32)
Calcium: 8.1 mg/dL — ABNORMAL LOW (ref 8.9–10.3)
Chloride: 111 mmol/L (ref 98–111)
Creatinine, Ser: 1.56 mg/dL — ABNORMAL HIGH (ref 0.61–1.24)
GFR calc Af Amer: 49 mL/min — ABNORMAL LOW (ref >60)
GFR calc non Af Amer: 42 mL/min — ABNORMAL LOW (ref >60)
Glucose, Bld: 97 mg/dL (ref 70–99)
Potassium: 4.2 mmol/L (ref 3.5–5.1)
Sodium: 138 mmol/L (ref 135–145)

## 2018-10-17 LAB — TYPE AND SCREEN
ABO/RH(D): O POS
Antibody Screen: NEGATIVE

## 2018-10-17 LAB — ABO/RH: ABO/RH(D): O POS

## 2018-10-17 SURGERY — LEFT HEART CATH AND CORONARY ANGIOGRAPHY
Anesthesia: LOCAL

## 2018-10-17 MED ORDER — SODIUM CHLORIDE 0.9 % WEIGHT BASED INFUSION
1.0000 mL/kg/h | INTRAVENOUS | Status: DC
  Administered 2018-10-17: 1 mL/kg/h via INTRAVENOUS

## 2018-10-17 MED ORDER — SODIUM CHLORIDE 0.9 % IV SOLN: 250.0000 mL | INTRAVENOUS | Status: DC | PRN

## 2018-10-17 MED ORDER — SODIUM BICARBONATE BOLUS VIA INFUSION
INTRAVENOUS | Status: DC
  Filled 2018-10-17 (×2): qty 1

## 2018-10-17 MED ORDER — SODIUM CHLORIDE 0.9 % WEIGHT BASED INFUSION: 1.0000 mL/kg/h | INTRAVENOUS | Status: DC

## 2018-10-17 MED ORDER — IOHEXOL 350 MG/ML SOLN
100.0000 mL | Freq: Once | INTRAVENOUS | Status: AC | PRN
  Administered 2018-10-17: 100 mL via INTRAVENOUS

## 2018-10-17 MED ORDER — SODIUM CHLORIDE 0.9 % WEIGHT BASED INFUSION: 3.0000 mL/kg/h | INTRAVENOUS | Status: DC

## 2018-10-17 MED ORDER — NITROGLYCERIN 0.4 MG SL SUBL
SUBLINGUAL_TABLET | SUBLINGUAL | Status: AC
  Filled 2018-10-17: qty 2

## 2018-10-17 MED ORDER — NITROGLYCERIN 0.4 MG SL SUBL
0.8000 mg | SUBLINGUAL_TABLET | Freq: Once | SUBLINGUAL | Status: AC
  Administered 2018-10-17: 0.8 mg via SUBLINGUAL

## 2018-10-17 MED ORDER — SODIUM CHLORIDE 0.9% IV SOLUTION: Freq: Once | INTRAVENOUS | Status: DC

## 2018-10-17 MED ORDER — PREDNISONE 20 MG PO TABS
60.0000 mg | ORAL_TABLET | Freq: Every day | ORAL | Status: DC
  Administered 2018-10-17 – 2018-10-19 (×3): 60 mg via ORAL
  Filled 2018-10-17 (×3): qty 3

## 2018-10-17 MED ORDER — SODIUM BICARBONATE 8.4 % IV SOLN
INTRAVENOUS | Status: AC
  Administered 2018-10-17: 12:00:00 via INTRAVENOUS
  Filled 2018-10-17: qty 500

## 2018-10-17 MED ORDER — SODIUM CHLORIDE 0.9% FLUSH: 3.0000 mL | INTRAVENOUS | Status: DC | PRN

## 2018-10-17 MED ORDER — SODIUM CHLORIDE 0.9% FLUSH
3.0000 mL | Freq: Two times a day (BID) | INTRAVENOUS | Status: DC
  Administered 2018-10-17: 3 mL via INTRAVENOUS

## 2018-10-17 MED ORDER — SODIUM CHLORIDE 0.9% FLUSH: 10.0000 mL | INTRAVENOUS | Status: DC | PRN

## 2018-10-17 NOTE — Progress Notes (Signed)
Progress Note  Patient Name: [name] Date of Encounter: 10/17/2018  Primary Cardiologist: Previously Dr. [name] 2014  Subjective   Denies CP or SOB   Inpatient Medications    Scheduled Meds:  aspirin  81 mg Oral Pre-Cath   bisoprolol  10 mg Oral Daily   cholecalciferol  2,000 Units Oral Daily   lidocaine-EPINEPHrine  10 mL Infiltration Once   polymixin-bacitracin   Topical BID   simvastatin  20 mg Oral q1800   sodium chloride flush  3 mL Intravenous Q12H   sodium chloride flush  3 mL Intravenous Q12H   tamsulosin  0.4 mg Oral Daily   Continuous Infusions:  sodium chloride 75 mL/hr at 10/16/18 0225   sodium chloride     sodium chloride     Followed by   sodium chloride     ampicillin-sulbactam (UNASYN) IV 1.5 g (10/17/18 0354)   PRN Meds: sodium chloride, acetaminophen **OR** acetaminophen, hydrALAZINE, loratadine, morphine injection, nitroGLYCERIN, ondansetron **OR** ondansetron (ZOFRAN) IV, oxyCODONE-acetaminophen, senna-docusate, sodium chloride flush, zolpidem   Vital Signs    Vitals:   10/16/18 1909 10/17/18 0026 10/17/18 0350 10/17/18 0723  BP: 132/60 (!) 124/51 (!) 141/66 (!) 129/54  Pulse: (!) 57 60 63 (!) 58  Resp: 17 18 16 18   Temp: 98.3 F (36.8 C) 98.8 F (37.1 C) 98.1 F (36.7 C) 98.1 F (36.7 C)  TempSrc: Oral Oral Oral Oral  SpO2: 100% 100% 97% 97%  Weight:   75.5 kg   Height:        Intake/Output Summary (Last 24 hours) at 10/17/2018 0744 Last data filed at 10/17/2018 0026 Gross per 24 hour  Intake --  Output 200 ml  Net -200 ml   Filed Weights   10/15/18 1731 10/16/18 0025 10/17/18 0350  Weight: 74.4 kg 73.8 kg 75.5 kg   Physical Exam   General:  Sitting in chair. No resp difficulty HEENT: diffuse facial trauma Neck: supple. no JVD. Carotids 2+ bilat; no bruits. No lymphadenopathy or thryomegaly appreciated. Cor: PMI nondisplaced. Regular rate & rhythm. No rubs, gallops or murmurs. Lungs: clear Abdomen:  soft, nontender, nondistended. No hepatosplenomegaly. No bruits or masses. Good bowel sounds. Extremities: no cyanosis, clubbing, rash, edema Neuro: alert & orientedx3, cranial nerves grossly intact. moves all 4 extremities w/o difficulty. Affect pleasant   Labs    Chemistry Recent Labs  Lab 10/15/18 1806 10/15/18 1815 10/16/18 0339 10/17/18 0533  NA 139  --  138 138  K 3.5  --  3.8 4.2  CL 104  --  107 111  CO2 21*  --  20* 20*  GLUCOSE 101*  --  94 97  BUN 28*  --  27* 23  CREATININE 1.84* 1.70* 1.70* 1.56*  CALCIUM 9.2  --  8.6* 8.1*  GFRNONAA 35*  --  38* 42*  GFRAA 40*  --  44* 49*  ANIONGAP 14  --  11 7     Hematology Recent Labs  Lab 10/15/18 1806 10/16/18 0339 10/17/18 0533  WBC 47.3* 39.7* 32.8*  RBC 4.19* 3.90* 3.58*  HGB 12.5* 11.8* 10.6*  HCT 39.1 36.1* 33.7*  MCV 93.3 92.6 94.1  MCH 29.8 30.3 29.6  MCHC 32.0 32.7 31.5  RDW 13.4 13.5 13.7  PLT 36* 29* 24*    Cardiac Enzymes Recent Labs  Lab 10/15/18 1806 10/15/18 2335 10/16/18 0339 10/16/18 0944  TROPONINI <0.03 0.07* 0.10* 0.09*   No results for input(s): TROPIPOC in the last 168 hours.  BNPNo results for input(s): BNP, PROBNP in the last 168 hours.   DDimer No results for input(s): DDIMER in the last 168 hours.   Radiology    Ct Head Wo Contrast  Result Date: 10/16/2018 CLINICAL DATA:  Follow-up subarachnoid hemorrhage. EXAM: CT HEAD WITHOUT CONTRAST TECHNIQUE: Contiguous axial images were obtained from the base of the skull through the vertex without intravenous contrast. COMPARISON:  MRI same day.  CT 10/15/2018 FINDINGS: Brain: No increase in the small amount of post traumatic subarachnoid hemorrhage within a sulcus of the medial right frontal region. No evidence of acute brain injury otherwise. The hemorrhage is becoming less dense. No subdural hematoma. No sign of acute infarction. 13 mm meningioma at the left parietal vertex as seen previously. Vascular: There is atherosclerotic  calcification of the major vessels at the base of the brain. Skull: No skull fracture. Comminuted nasal fractures as seen previously. Probable minor fracture of the lamina papyracea/medial orbital wall on the right. Sinuses/Orbits: Mucosal inflammatory changes most notable affecting the right maxillary sinus. Small amount of fluid layering in the right frontal sinus. Probable minor fracture of lamina papyracea on the right. Other: None IMPRESSION: No increase in the small amount of post traumatic subarachnoid hemorrhage on the right. The hemorrhage is becoming less distinct. No development of any other bleeding. Left parietal vertex 13 mm meningioma as seen previously. Comminuted nasal fractures as seen previously. Probable minimal fracture of the medial orbital wall on the right. Electronically Signed   By: Nelson Chimes M.D.   On: 10/16/2018 20:34   Ct Head Wo Contrast  Result Date: 10/15/2018 CLINICAL DATA:  78 year old male status post syncope and blunt trauma. Nose and forehead lacerations. EXAM: CT HEAD WITHOUT CONTRAST CT MAXILLOFACIAL WITHOUT CONTRAST CT CERVICAL SPINE WITHOUT CONTRAST TECHNIQUE: Multidetector CT imaging of the head, cervical spine, and maxillofacial structures were performed using the standard protocol without intravenous contrast. Multiplanar CT image reconstructions of the cervical spine and maxillofacial structures were also generated. COMPARISON:  Paranasal sinus CT 04/06/2011. FINDINGS: CT HEAD FINDINGS Brain: Trace subarachnoid hemorrhage along the medial aspect of the right superior frontal gyrus on series 3, image 20 and series 5, image 21. No subdural blood or other extra-axial hemorrhage identified. No intraventricular hemorrhage. No parenchymal hemorrhagic contusion identified. Partially calcified left frontoparietal convexity 11-13 millimeter meningioma suspected (series 3, image 26 and coronal image 52). No significant mass effect. No cerebral edema. Gray-white matter  differentiation is within normal limits for age. No ventriculomegaly or intracranial mass effect. No cortically based acute infarct identified. Vascular: Calcified atherosclerosis at the skull base. No suspicious intracranial vascular hyperdensity. Skull: Calvarium appears intact. See facial bone findings below. Other: Forehead scalp hematoma tracks cephalad. See facial bone findings below. Other scalp soft tissues appear negative. CT MAXILLOFACIAL FINDINGS Osseous: Comminuted and impacted bilateral anterior nasal bone fractures with associated nasal bridge soft tissue swelling and soft tissue gas. The maxillary nasal processes remain intact. However, there is mild associated deformity of the anterior superior nasal septum. There is also abnormal soft tissue gas in and around the right medial frontoethmoidal confluence, with a possible nondisplaced fracture there on series 8, image 64. Otherwise the anterior walls of the frontal sinuses appear intact. Nondisplaced fracture versus nutrient foramen of the right mandible in the parasymphyseal region best seen on series 13, image 42. Elsewhere the mandible appears intact and normally aligned, but there is superimposed poor dentition. No maxilla or zygoma fracture. Poor maxillary dentition posteriorly. Central skull base intact. Pterygoid plates  intact. Orbits: Trace gas in the anterior superior right orbit suspicious for nondisplaced lamina papyracea fracture there on series 8, image 70 and series 12, image 33. Other orbital walls are intact. Globes are intact. There is preseptal soft tissue swelling/hematoma, but no intraorbital hematoma. No other intraorbital gas. Sinuses: Trace hemorrhage layering in the right frontal sinus (series 6, image 68) associated with the nondisplaced frontoethmoidal fractures described above. Mild right anterior ethmoid bladder mucosal thickening. Minimal to mild paranasal sinus mucosal thickening elsewhere. Small volume retained secretions  in the nasal cavity. Leftward anterior nasal septal deviation may be acute and associated with the nasal bone fractures described earlier. Negative nasal cavity otherwise. Tympanic cavities and mastoids are clear. Soft tissues: Negative visible noncontrast larynx, pharynx, parapharyngeal spaces, retropharyngeal space, sublingual space, submandibular and parotid glands. There is moderate bilateral level 1, level 2, and visible level 3 lymphadenopathy with enlarged individual nodes up to 17 millimeters short axis. CT CERVICAL SPINE FINDINGS Alignment: Mild reversal of cervical lordosis. Cervicothoracic junction alignment is within normal limits. Bilateral posterior element alignment is within normal limits. Skull base and vertebrae: Visualized skull base is intact. No atlanto-occipital dissociation. No acute osseous abnormality identified. Soft tissues and spinal canal: No prevertebral fluid or swelling. No visible canal hematoma. Generalized bilateral cervical lymphadenopathy continuing to the thoracic inlet (series 15, image 64). Disc levels: Congenital incomplete segmentation versus acquired ankylosis of C6-C7. Advanced cervical disc and endplate degeneration elsewhere. At least mild multifactorial spinal stenosis at C4-C5 and C5-C6. Upper chest: Negative lung apices. Grossly intact visible upper thoracic levels. IMPRESSION: 1. Positive for trace posttraumatic subarachnoid hemorrhage along the medial aspect of the right superior frontal gyrus. No other acute traumatic injury to the brain identified. 2. Positive for generalized cervical Lymphadenopathy continuing to the thoracic inlet. On discussion with Dr. Tyrone Nine in the ED he advises the patient has chronic lymphocytic leukemia (CLL) which explains this appearance. 3. Comminuted and impacted bilateral nasal bone fractures. Probable associated fracture of the anterior superior nasal septum. Nondisplaced fracture of the anterior right frontoethmoidal confluence  suspected, with mild regional soft tissue gas. Nondisplaced fracture of the right anterior lamina papyracea with trace gas in the anterior superior right orbit. No intraorbital hematoma. 4. Probable nutrient foramen of the right mandible parasymphysis, less likely a nondisplaced fracture (series 13, image 42). 5. No acute fracture in the cervical spine. 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 Study discussed by telephone with Dr. Tyrone Nine on 10/15/2018 at 19:50 . Electronically Signed   By: Genevie Ann M.D.   On: 10/15/2018 19:56   Ct Angio Chest Pe W And/or Wo Contrast  Result Date: 10/15/2018 CLINICAL DATA:  78 year old male status post syncope and blunt trauma. Chest tightness, shortness of breath. CLL. EXAM: CT ANGIOGRAPHY CHEST WITH CONTRAST TECHNIQUE: Multidetector CT imaging of the chest was performed using the standard protocol during bolus administration of intravenous contrast. Multiplanar CT image reconstructions and MIPs were obtained to evaluate the vascular anatomy. CONTRAST:  28mL OMNIPAQUE IOHEXOL 350 MG/ML SOLN COMPARISON:  Head face and cervical spine CT today reported separately. Chest radiographs 08/17/2016. Chest CT 03/16/2009. FINDINGS: Cardiovascular: Good contrast bolus timing in the pulmonary arterial tree. Mild respiratory motion. No focal filling defect identified in the pulmonary arteries to suggest acute pulmonary embolism. Negative visible aorta aside from mild atherosclerosis. Mild cardiomegaly. No pericardial effusion. Mediastinum/Nodes: Lower cervical lymphadenopathy continuing to the thoracic inlet as seen on the face and cervical spine imaging today. There  is mild to moderate mediastinal and hilar lymphadenopathy, new since 2010. Individual nodes measure up to 12 millimeter short axis. Lungs/Pleura: Major airways are patent. No pneumothorax or pleural effusion. No pulmonary contusion. Mild chronic scarring in the lingula  and medial segment of the right middle lobe. There is a round 10-11 millimeter anterior basal segment left lower lobe lung nodule which was 7 millimeters in 2010. This is most likely benign. Upper Abdomen: Partially visible splenomegaly, new since 2010. Negative visible liver and bowel in the upper abdomen. Musculoskeletal: No rib or sternal fracture identified. Thoracic spine degeneration. No acute osseous abnormality identified. Review of the MIP images confirms the above findings. IMPRESSION: 1. No evidence of acute pulmonary embolus. No acute traumatic injury identified in the chest. 2. History of CLL with evidence of active disease: splenomegaly, cervical and mediastinal lymphadenopathy. 3. No acute pulmonary abnormality. A solitary lung nodule in the left lower lobe has minimally increased from 7 to 11 mm over the past 10 years compatible with benign etiology. 4. Mild cardiomegaly. Electronically Signed   By: Genevie Ann M.D.   On: 10/15/2018 20:01   Ct Cervical Spine Wo Contrast  Result Date: 10/15/2018 CLINICAL DATA:  78 year old male status post syncope and blunt trauma. Nose and forehead lacerations. EXAM: CT HEAD WITHOUT CONTRAST CT MAXILLOFACIAL WITHOUT CONTRAST CT CERVICAL SPINE WITHOUT CONTRAST TECHNIQUE: Multidetector CT imaging of the head, cervical spine, and maxillofacial structures were performed using the standard protocol without intravenous contrast. Multiplanar CT image reconstructions of the cervical spine and maxillofacial structures were also generated. COMPARISON:  Paranasal sinus CT 04/06/2011. FINDINGS: CT HEAD FINDINGS Brain: Trace subarachnoid hemorrhage along the medial aspect of the right superior frontal gyrus on series 3, image 20 and series 5, image 21. No subdural blood or other extra-axial hemorrhage identified. No intraventricular hemorrhage. No parenchymal hemorrhagic contusion identified. Partially calcified left frontoparietal convexity 11-13 millimeter meningioma suspected  (series 3, image 26 and coronal image 52). No significant mass effect. No cerebral edema. Gray-white matter differentiation is within normal limits for age. No ventriculomegaly or intracranial mass effect. No cortically based acute infarct identified. Vascular: Calcified atherosclerosis at the skull base. No suspicious intracranial vascular hyperdensity. Skull: Calvarium appears intact. See facial bone findings below. Other: Forehead scalp hematoma tracks cephalad. See facial bone findings below. Other scalp soft tissues appear negative. CT MAXILLOFACIAL FINDINGS Osseous: Comminuted and impacted bilateral anterior nasal bone fractures with associated nasal bridge soft tissue swelling and soft tissue gas. The maxillary nasal processes remain intact. However, there is mild associated deformity of the anterior superior nasal septum. There is also abnormal soft tissue gas in and around the right medial frontoethmoidal confluence, with a possible nondisplaced fracture there on series 8, image 64. Otherwise the anterior walls of the frontal sinuses appear intact. Nondisplaced fracture versus nutrient foramen of the right mandible in the parasymphyseal region best seen on series 13, image 42. Elsewhere the mandible appears intact and normally aligned, but there is superimposed poor dentition. No maxilla or zygoma fracture. Poor maxillary dentition posteriorly. Central skull base intact. Pterygoid plates intact. Orbits: Trace gas in the anterior superior right orbit suspicious for nondisplaced lamina papyracea fracture there on series 8, image 70 and series 12, image 33. Other orbital walls are intact. Globes are intact. There is preseptal soft tissue swelling/hematoma, but no intraorbital hematoma. No other intraorbital gas. Sinuses: Trace hemorrhage layering in the right frontal sinus (series 6, image 68) associated with the nondisplaced frontoethmoidal fractures described above. Mild right anterior  ethmoid bladder  mucosal thickening. Minimal to mild paranasal sinus mucosal thickening elsewhere. Small volume retained secretions in the nasal cavity. Leftward anterior nasal septal deviation may be acute and associated with the nasal bone fractures described earlier. Negative nasal cavity otherwise. Tympanic cavities and mastoids are clear. Soft tissues: Negative visible noncontrast larynx, pharynx, parapharyngeal spaces, retropharyngeal space, sublingual space, submandibular and parotid glands. There is moderate bilateral level 1, level 2, and visible level 3 lymphadenopathy with enlarged individual nodes up to 17 millimeters short axis. CT CERVICAL SPINE FINDINGS Alignment: Mild reversal of cervical lordosis. Cervicothoracic junction alignment is within normal limits. Bilateral posterior element alignment is within normal limits. Skull base and vertebrae: Visualized skull base is intact. No atlanto-occipital dissociation. No acute osseous abnormality identified. Soft tissues and spinal canal: No prevertebral fluid or swelling. No visible canal hematoma. Generalized bilateral cervical lymphadenopathy continuing to the thoracic inlet (series 15, image 64). Disc levels: Congenital incomplete segmentation versus acquired ankylosis of C6-C7. Advanced cervical disc and endplate degeneration elsewhere. At least mild multifactorial spinal stenosis at C4-C5 and C5-C6. Upper chest: Negative lung apices. Grossly intact visible upper thoracic levels. IMPRESSION: 1. Positive for trace posttraumatic subarachnoid hemorrhage along the medial aspect of the right superior frontal gyrus. No other acute traumatic injury to the brain identified. 2. Positive for generalized cervical Lymphadenopathy continuing to the thoracic inlet. On discussion with Dr. Tyrone Nine in the ED he advises the patient has chronic lymphocytic leukemia (CLL) which explains this appearance. 3. Comminuted and impacted bilateral nasal bone fractures. Probable associated fracture  of the anterior superior nasal septum. Nondisplaced fracture of the anterior right frontoethmoidal confluence suspected, with mild regional soft tissue gas. Nondisplaced fracture of the right anterior lamina papyracea with trace gas in the anterior superior right orbit. No intraorbital hematoma. 4. Probable nutrient foramen of the right mandible parasymphysis, less likely a nondisplaced fracture (series 13, image 42). 5. No acute fracture in the cervical spine. 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 Study discussed by telephone with Dr. Tyrone Nine on 10/15/2018 at 19:50 . Electronically Signed   By: Genevie Ann M.D.   On: 10/15/2018 19:56   Mr Brain Wo Contrast (neuro Protocol)  Result Date: 10/16/2018 CLINICAL DATA:  Initial evaluation for acute syncope, bilateral hand tingling. EXAM: MRI HEAD WITHOUT CONTRAST MRI CERVICAL SPINE WITHOUT CONTRAST TECHNIQUE: Multiplanar, multiecho pulse sequences of the brain and surrounding structures, and cervical spine, to include the craniocervical junction and cervicothoracic junction, were obtained without intravenous contrast. COMPARISON:  Prior CT from 10/15/2018 FINDINGS: MRI HEAD FINDINGS Brain: Cerebral volume within normal limits. No significant cerebral white matter changes for age. No abnormal foci of restricted diffusion to suggest acute or subacute ischemia. Gray-white matter differentiation maintained. No encephalomalacia to suggest chronic cortical infarction. Scattered small volume susceptibility artifact overlying the medial aspect of the right superior frontal gyrus compatible with small volume posttraumatic subarachnoid hemorrhage, stable from prior CT. No other new or progressive intracranial hemorrhage. Trace layering blood products noted within the occipital horns of both lateral ventricles, compatible with redistribution. 13 mm meningioma overlies the parasagittal left parietal convexity (series 11,  image 20). No associated edema or mass effect. No other mass lesion. No midline shift or mass effect. No hydrocephalus. No extra-axial fluid collection. Pituitary gland suprasellar region normal. Midline structures intact and normal. Vascular: Major intracranial vascular flow voids are well maintained. Skull and upper cervical spine: Craniocervical junction normal. Bone marrow signal intensity within normal limits. Focal  17 mm T1 hypointense lesion present within the right parietal calvarium (series 9, image 6). Additional possible 2 cm lesion posteriorly within the left occipital calvarium (series 9, image 15). Findings are indeterminate. No corresponding osseous lesion seen on prior CT. Soft tissue swelling seen at the forehead, extending inferiorly to involve the nasal bridge and nose, also seen on prior maxillofacial CT. Associated maxillofacial fractures better evaluated on prior exam. Sinuses/Orbits: Globes and orbital soft tissues within normal limits. Probable right orbital fracture better seen on prior CT. Scattered mucosal thickening throughout the ethmoidal air cells and maxillary sinuses. No significant mastoid effusion. Inner ear structures normal. Other: None. MRI CERVICAL SPINE FINDINGS Alignment: Straightening with slight reversal of the normal cervical lordosis, apex at C5-6. Trace anterolisthesis of C4 on C5, likely chronic and facet mediated. No malalignment. Vertebrae: Vertebral body heights maintained without evidence for acute or chronic fracture. C6 and C7 vertebral bodies are largely ankylosed. Underlying bone marrow signal intensity somewhat diffusely decreased on T1 weighted imaging, suspected to be related history of CLL. No discrete or worrisome osseous lesions. Cord: Signal intensity within the cervical spinal cord is normal. No evidence for traumatic cord injury or myelomalacia. Posterior Fossa, vertebral arteries, paraspinal tissues: Craniocervical junction normal. Paraspinous and  prevertebral soft tissues demonstrate no acute finding. No evidence for ligamentous injury. Prominent bilateral cervical adenopathy again noted, compatible with history of CLL. Normal intravascular flow voids seen within the vertebral arteries bilaterally. Disc levels: C2-C3: Mild bilateral uncovertebral and facet hypertrophy. No significant spinal stenosis. Mild right C3 foraminal narrowing. C3-C4: Broad-based posterior disc protrusion indents the ventral thecal sac. Mild facet and ligament flavum hypertrophy. Mild spinal stenosis without cord deformity. Superimposed uncovertebral hypertrophy with resultant moderate right worse than left C4 foraminal stenosis. C4-C5: Mild diffuse disc bulge with uncovertebral hypertrophy. Superimposed facet and ligament flavum hypertrophy. Resultant moderate spinal stenosis without significant cord deformity. Severe left with moderate right C5 foraminal narrowing. C5-C6: Diffuse circumferential disc osteophyte with intervertebral disc space narrowing. Superimposed facet and ligament flavum hypertrophy. Resultant severe spinal stenosis with mild cord flattening. No cord signal changes. Thecal sac measures 7 mm in AP diameter. Severe bilateral C6 foraminal narrowing, right worse than left. C6-C7: C6 and C7 vertebral bodies are partially ankylosed. Associated endplate osseous ridging with uncovertebral hypertrophy. Flattening of the ventral thecal sac without significant spinal stenosis. Moderate right with mild left C7 foraminal stenosis. C7-T1: Mild disc bulge with right-sided uncovertebral hypertrophy. Mild right-sided facet degeneration. No spinal stenosis. Mild right C8 foraminal stenosis. Visualized upper thoracic spine demonstrates no significant finding. IMPRESSION: MRI HEAD IMPRESSION: 1. Small volume acute posttraumatic subarachnoid hemorrhage involving the medial right frontal lobe, stable from prior CT. No evidence for new or progressive hemorrhage. 2. No other acute  intracranial abnormality. 3. Focal 1-2 cm osseous lesions involving the right parietal and occipital calvarium as above, indeterminate. Findings are of uncertain significance, with no definite corresponding osseous lesion seen on prior CT. Correlation with dedicated bone scan suggested for further evaluation. 4. Soft tissue contusion involving the forehead and central face with associated facial fractures, better seen on prior CT. 5. 13 mm meningioma overlying the left parietal convexity without associated edema. MRI CERVICAL SPINE IMPRESSION: 1. No acute traumatic injury or other finding within the cervical spine status post recent syncope and fall. No evidence for spinal cord or ligamentous injury. 2. Moderate multilevel cervical spondylolysis with resultant moderate spinal stenosis at C4-5 and more severe narrowing at C5-6. 3. Multifactorial degenerative changes with resultant multilevel foraminal  narrowing as above. Notable findings include moderate bilateral C4 foraminal stenosis, severe left with moderate right C5 foraminal narrowing, severe bilateral C6 foraminal stenosis, with moderate right C7 foraminal narrowing. 4. Prominent bulky cervical adenopathy within the visualized neck, compatible with history of CLL. Electronically Signed   By: Jeannine Boga M.D.   On: 10/16/2018 02:32   Mr Cervical Spine Wo Contrast  Result Date: 10/16/2018 CLINICAL DATA:  Initial evaluation for acute syncope, bilateral hand tingling. EXAM: MRI HEAD WITHOUT CONTRAST MRI CERVICAL SPINE WITHOUT CONTRAST TECHNIQUE: Multiplanar, multiecho pulse sequences of the brain and surrounding structures, and cervical spine, to include the craniocervical junction and cervicothoracic junction, were obtained without intravenous contrast. COMPARISON:  Prior CT from 10/15/2018 FINDINGS: MRI HEAD FINDINGS Brain: Cerebral volume within normal limits. No significant cerebral white matter changes for age. No abnormal foci of restricted  diffusion to suggest acute or subacute ischemia. Gray-white matter differentiation maintained. No encephalomalacia to suggest chronic cortical infarction. Scattered small volume susceptibility artifact overlying the medial aspect of the right superior frontal gyrus compatible with small volume posttraumatic subarachnoid hemorrhage, stable from prior CT. No other new or progressive intracranial hemorrhage. Trace layering blood products noted within the occipital horns of both lateral ventricles, compatible with redistribution. 13 mm meningioma overlies the parasagittal left parietal convexity (series 11, image 20). No associated edema or mass effect. No other mass lesion. No midline shift or mass effect. No hydrocephalus. No extra-axial fluid collection. Pituitary gland suprasellar region normal. Midline structures intact and normal. Vascular: Major intracranial vascular flow voids are well maintained. Skull and upper cervical spine: Craniocervical junction normal. Bone marrow signal intensity within normal limits. Focal 17 mm T1 hypointense lesion present within the right parietal calvarium (series 9, image 6). Additional possible 2 cm lesion posteriorly within the left occipital calvarium (series 9, image 15). Findings are indeterminate. No corresponding osseous lesion seen on prior CT. Soft tissue swelling seen at the forehead, extending inferiorly to involve the nasal bridge and nose, also seen on prior maxillofacial CT. Associated maxillofacial fractures better evaluated on prior exam. Sinuses/Orbits: Globes and orbital soft tissues within normal limits. Probable right orbital fracture better seen on prior CT. Scattered mucosal thickening throughout the ethmoidal air cells and maxillary sinuses. No significant mastoid effusion. Inner ear structures normal. Other: None. MRI CERVICAL SPINE FINDINGS Alignment: Straightening with slight reversal of the normal cervical lordosis, apex at C5-6. Trace anterolisthesis of  C4 on C5, likely chronic and facet mediated. No malalignment. Vertebrae: Vertebral body heights maintained without evidence for acute or chronic fracture. C6 and C7 vertebral bodies are largely ankylosed. Underlying bone marrow signal intensity somewhat diffusely decreased on T1 weighted imaging, suspected to be related history of CLL. No discrete or worrisome osseous lesions. Cord: Signal intensity within the cervical spinal cord is normal. No evidence for traumatic cord injury or myelomalacia. Posterior Fossa, vertebral arteries, paraspinal tissues: Craniocervical junction normal. Paraspinous and prevertebral soft tissues demonstrate no acute finding. No evidence for ligamentous injury. Prominent bilateral cervical adenopathy again noted, compatible with history of CLL. Normal intravascular flow voids seen within the vertebral arteries bilaterally. Disc levels: C2-C3: Mild bilateral uncovertebral and facet hypertrophy. No significant spinal stenosis. Mild right C3 foraminal narrowing. C3-C4: Broad-based posterior disc protrusion indents the ventral thecal sac. Mild facet and ligament flavum hypertrophy. Mild spinal stenosis without cord deformity. Superimposed uncovertebral hypertrophy with resultant moderate right worse than left C4 foraminal stenosis. C4-C5: Mild diffuse disc bulge with uncovertebral hypertrophy. Superimposed facet and ligament flavum hypertrophy. Resultant  moderate spinal stenosis without significant cord deformity. Severe left with moderate right C5 foraminal narrowing. C5-C6: Diffuse circumferential disc osteophyte with intervertebral disc space narrowing. Superimposed facet and ligament flavum hypertrophy. Resultant severe spinal stenosis with mild cord flattening. No cord signal changes. Thecal sac measures 7 mm in AP diameter. Severe bilateral C6 foraminal narrowing, right worse than left. C6-C7: C6 and C7 vertebral bodies are partially ankylosed. Associated endplate osseous ridging with  uncovertebral hypertrophy. Flattening of the ventral thecal sac without significant spinal stenosis. Moderate right with mild left C7 foraminal stenosis. C7-T1: Mild disc bulge with right-sided uncovertebral hypertrophy. Mild right-sided facet degeneration. No spinal stenosis. Mild right C8 foraminal stenosis. Visualized upper thoracic spine demonstrates no significant finding. IMPRESSION: MRI HEAD IMPRESSION: 1. Small volume acute posttraumatic subarachnoid hemorrhage involving the medial right frontal lobe, stable from prior CT. No evidence for new or progressive hemorrhage. 2. No other acute intracranial abnormality. 3. Focal 1-2 cm osseous lesions involving the right parietal and occipital calvarium as above, indeterminate. Findings are of uncertain significance, with no definite corresponding osseous lesion seen on prior CT. Correlation with dedicated bone scan suggested for further evaluation. 4. Soft tissue contusion involving the forehead and central face with associated facial fractures, better seen on prior CT. 5. 13 mm meningioma overlying the left parietal convexity without associated edema. MRI CERVICAL SPINE IMPRESSION: 1. No acute traumatic injury or other finding within the cervical spine status post recent syncope and fall. No evidence for spinal cord or ligamentous injury. 2. Moderate multilevel cervical spondylolysis with resultant moderate spinal stenosis at C4-5 and more severe narrowing at C5-6. 3. Multifactorial degenerative changes with resultant multilevel foraminal narrowing as above. Notable findings include moderate bilateral C4 foraminal stenosis, severe left with moderate right C5 foraminal narrowing, severe bilateral C6 foraminal stenosis, with moderate right C7 foraminal narrowing. 4. Prominent bulky cervical adenopathy within the visualized neck, compatible with history of CLL. Electronically Signed   By: Jeannine Boga M.D.   On: 10/16/2018 02:32   Ct Maxillofacial Wo  Cm  Result Date: 10/15/2018 CLINICAL DATA:  78 year old male status post syncope and blunt trauma. Nose and forehead lacerations. EXAM: CT HEAD WITHOUT CONTRAST CT MAXILLOFACIAL WITHOUT CONTRAST CT CERVICAL SPINE WITHOUT CONTRAST TECHNIQUE: Multidetector CT imaging of the head, cervical spine, and maxillofacial structures were performed using the standard protocol without intravenous contrast. Multiplanar CT image reconstructions of the cervical spine and maxillofacial structures were also generated. COMPARISON:  Paranasal sinus CT 04/06/2011. FINDINGS: CT HEAD FINDINGS Brain: Trace subarachnoid hemorrhage along the medial aspect of the right superior frontal gyrus on series 3, image 20 and series 5, image 21. No subdural blood or other extra-axial hemorrhage identified. No intraventricular hemorrhage. No parenchymal hemorrhagic contusion identified. Partially calcified left frontoparietal convexity 11-13 millimeter meningioma suspected (series 3, image 26 and coronal image 52). No significant mass effect. No cerebral edema. Gray-white matter differentiation is within normal limits for age. No ventriculomegaly or intracranial mass effect. No cortically based acute infarct identified. Vascular: Calcified atherosclerosis at the skull base. No suspicious intracranial vascular hyperdensity. Skull: Calvarium appears intact. See facial bone findings below. Other: Forehead scalp hematoma tracks cephalad. See facial bone findings below. Other scalp soft tissues appear negative. CT MAXILLOFACIAL FINDINGS Osseous: Comminuted and impacted bilateral anterior nasal bone fractures with associated nasal bridge soft tissue swelling and soft tissue gas. The maxillary nasal processes remain intact. However, there is mild associated deformity of the anterior superior nasal septum. There is also abnormal soft tissue gas in and  around the right medial frontoethmoidal confluence, with a possible nondisplaced fracture there on series 8,  image 64. Otherwise the anterior walls of the frontal sinuses appear intact. Nondisplaced fracture versus nutrient foramen of the right mandible in the parasymphyseal region best seen on series 13, image 42. Elsewhere the mandible appears intact and normally aligned, but there is superimposed poor dentition. No maxilla or zygoma fracture. Poor maxillary dentition posteriorly. Central skull base intact. Pterygoid plates intact. Orbits: Trace gas in the anterior superior right orbit suspicious for nondisplaced lamina papyracea fracture there on series 8, image 70 and series 12, image 33. Other orbital walls are intact. Globes are intact. There is preseptal soft tissue swelling/hematoma, but no intraorbital hematoma. No other intraorbital gas. Sinuses: Trace hemorrhage layering in the right frontal sinus (series 6, image 68) associated with the nondisplaced frontoethmoidal fractures described above. Mild right anterior ethmoid bladder mucosal thickening. Minimal to mild paranasal sinus mucosal thickening elsewhere. Small volume retained secretions in the nasal cavity. Leftward anterior nasal septal deviation may be acute and associated with the nasal bone fractures described earlier. Negative nasal cavity otherwise. Tympanic cavities and mastoids are clear. Soft tissues: Negative visible noncontrast larynx, pharynx, parapharyngeal spaces, retropharyngeal space, sublingual space, submandibular and parotid glands. There is moderate bilateral level 1, level 2, and visible level 3 lymphadenopathy with enlarged individual nodes up to 17 millimeters short axis. CT CERVICAL SPINE FINDINGS Alignment: Mild reversal of cervical lordosis. Cervicothoracic junction alignment is within normal limits. Bilateral posterior element alignment is within normal limits. Skull base and vertebrae: Visualized skull base is intact. No atlanto-occipital dissociation. No acute osseous abnormality identified. Soft tissues and spinal canal: No  prevertebral fluid or swelling. No visible canal hematoma. Generalized bilateral cervical lymphadenopathy continuing to the thoracic inlet (series 15, image 64). Disc levels: Congenital incomplete segmentation versus acquired ankylosis of C6-C7. Advanced cervical disc and endplate degeneration elsewhere. At least mild multifactorial spinal stenosis at C4-C5 and C5-C6. Upper chest: Negative lung apices. Grossly intact visible upper thoracic levels. IMPRESSION: 1. Positive for trace posttraumatic subarachnoid hemorrhage along the medial aspect of the right superior frontal gyrus. No other acute traumatic injury to the brain identified. 2. Positive for generalized cervical Lymphadenopathy continuing to the thoracic inlet. On discussion with Dr. Tyrone Nine in the ED he advises the patient has chronic lymphocytic leukemia (CLL) which explains this appearance. 3. Comminuted and impacted bilateral nasal bone fractures. Probable associated fracture of the anterior superior nasal septum. Nondisplaced fracture of the anterior right frontoethmoidal confluence suspected, with mild regional soft tissue gas. Nondisplaced fracture of the right anterior lamina papyracea with trace gas in the anterior superior right orbit. No intraorbital hematoma. 4. Probable nutrient foramen of the right mandible parasymphysis, less likely a nondisplaced fracture (series 13, image 42). 5. No acute fracture in the cervical spine. 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 Study discussed by telephone with Dr. Tyrone Nine on 10/15/2018 at 19:50 . Electronically Signed   By: Genevie Ann M.D.   On: 10/15/2018 19:56   Telemetry    Sinus/sinus brady 50-60 Personally reviewed   ECG    No new tracing as of 10/17/2018- Personally Reviewed  Cardiac Studies   Echocardiogram 10/16/2018:   1. The left ventricle has hyperdynamic systolic function, with an ejection fraction of >65%. The cavity size was  normal. Left ventricular diastolic Doppler parameters are consistent with pseudonormal.  2. There is a dynamic mid LV gradient of 25 mmHg.  3. The  right ventricle has normal systolc function. There is no increase in right ventricular wall thickness.  4. No evidence of mitral valve stenosis.  5. No stenosis of the aortic valve.  Cardiac Catheterization 09/20/2012: Impressions: 1. Focal mid LAD stenosis of moderate severity between 50 and 70%. Ostial ramus of approximately 50%. Small caliber posterior descending artery and posterior lateral artery, possible vaso-reactive. Initial angiogram of left coronary system demonstrated quite significant small caliber vessels diffusely. These seem to improve with administration of intracoronary nitroglycerin. 2. Normal left ventricular systolic function. LVEDP 14 mmHg. Ejection fraction 65%.  Recommendations:Medical management with isosorbide for possible vaso-reactivity. He cannot use Viagra with long-acting nitrates. I will also continue low-dose beta blocker, ziac. Hopefully these 2 medications will help with overall symptoms. If symptoms worsen or become more worrisome, further evaluation with flow wire may be useful to evaluate the severity of the LAD lesion. _______________  Echocardiogram 08/19/2016: Study Conclusions: - Left ventricle: The cavity size was normal. Wall thickness was increased in a pattern of mild LVH. Systolic function was vigorous. The estimated ejection fraction was in the range of 65% to 70%. Wall motion was normal; there were no regional wall motion abnormalities. Features are consistent with a pseudonormal left ventricular filling pattern, with concomitant abnormal relaxation and increased filling pressure (grade 2 diastolic dysfunction). - Aortic valve: There was trivial regurgitation. - Pulmonary arteries: Systolic pressure was mildly increased. PA peak pressure: 44 mm Hg (S).  Impressions: - Vigorous  LV systolic function; mild LVH; grade 2 diastolic dysfunction; calcified aortic valve with trace AI; mildly elevated LVOT velocity likely related to vigorous LV function; trace TR; mildly elevated pulmonary pressure.  Patient Profile     78 y.o. male with a history of moderate non-obstructive CAD on cardiac catheterization in 2015, hypertension, hyperlipidemia, GERD, CLL, and chronic thrombocytopenia, who is being seen today for the evaluation of elevated troponin at the request of Dr. Blaine Hamper.  Assessment & Plan    1. Syncope with preceding chest pain: -EKG normal without ST-T abnormalities or QT prolongation  - Echo 10/16/2018 with LVEF of greater than 65%, no evidence of ventricular wall thickness or valvular disease - Presentation concerning for unstable angina and aborted SCD.  - Patient has known moderate disease of LAD. Will need repeat angiography this admission but complicated by low platelets and AKI. - I have d/w Dr. [name] and Dr. Tamala Julian will proceed with coronary CTA if dye load not unreasonable otherwise could consider PLT transfusion and diagnostic cath only  -If patient needs intervention and DAPT therapy would likely need IVIG + steroids first to increase platelets over a week or 2 before intervention -Will likely need to EP evaluation and event monitor at hospital discharge  2. Unstable Angina: -Patient reported progressive chest tightness and shortness of breath over the last month.  -Last cardiac catheterization in 2014 showed 50-70% stenosis of the mid LAD. -Troponin minimally elevated and flat. Not consistent with ACS. - See discussion above - Echo without LV dysfunction, 65% with no wall motion abnormalities or valvular disease - Stable LDL 61 and hemoglobin A1c 5.4. - Continue ASA 81 bisoprolol 10, simvastatin 20  3. Hypertension: -Stable, 129/54, 141/66, 124/51 most recent BP 130/57 -Continue bisoprolol 10 mg -HCTZ held on admission secondary to AKI  4.  AKI: -Improving, creatinine today 1.56 down from 1.70 on 10/16/2018  5. Subarachnoid Hemorrhage: -Head CT showed trace post-traumatic subarachnoid hemorrhage along the medial aspect of the right superior frontal gyrus. -Management per primary team and  Neurosurgery.  6. Nasal Bone Fracture:  -Head CT showed comminuted impacted bilateral nasal bone fractures. -ENT recommended following up as outpatient.  7. CLL and Thrombocytopenia: - Patient reports he has stable and asymptomatic CLL and follows up with Hem-Onc ([name]) regularly.  -Platelet 24 today with recent baseline in the 30's -See Discussion above   Addendum: CTA dye load ~70cc. Will proceed. I spoke with his daughter, Dr. Katrina Stack byt phone and discussed the plan in detail.   Glori Bickers, MD  9:26 AM     For questions or updates, please contact   Please consult [email or web address] for contact info under Cardiology/STEMI.

## 2018-10-17 NOTE — Progress Notes (Addendum)
HEMATOLOGY-ONCOLOGY PROGRESS NOTE  SUBJECTIVE: The patient is feeling better today.  Cervical collar has been removed.  Reports some fatigue.  Reports that he had a small amount of bleeding from his right eye that has now stopped.  No other bleeding was reported.  He has no other complaints today.  REVIEW OF SYSTEMS:   Constitutional: Denies fevers, chills.  Reports mild fatigue. Eyes: Denies blurriness of vision Ears, nose, mouth, throat, and face: Denies mucositis or sore throat Respiratory: Had shortness of breath prior to syncopal episode.  Cardiovascular: Denies chest pain. Gastrointestinal:  Denies nausea, heartburn or change in bowel habits Skin: Has multiple bruises to his face.  Lymphatics: Has stable lymphadenopathy  Neurological: Has mild dizziness today. No headaches. Behavioral/Psych: Mood is stable, no new changes  Extremities: No lower extremity edema All other systems were reviewed with the patient and are negative.  I have reviewed the past medical history, past surgical history, social history and family history with the patient and they are unchanged from previous note.   PHYSICAL EXAMINATION:  Vitals:   10/17/18 0350 10/17/18 0723  BP: (!) 141/66 (!) 129/54  Pulse: 63 (!) 58  Resp: 16 18  Temp: 98.1 F (36.7 C) 98.1 F (36.7 C)  SpO2: 97% 97%   Filed Weights   10/15/18 1731 10/16/18 0025 10/17/18 0350  Weight: 164 lb (74.4 kg) 162 lb 11.2 oz (73.8 kg) 166 lb 7.2 oz (75.5 kg)    GENERAL:alert, no distress SKIN: skin color, texture, turgor are normal, no rashes or significant lesions HENT: Swelling decreased compared to yesterday.  Bruising and swelling to both eyes. Multiple bruises to face. Small amount of dried blood to nostrils.  LUNGS: clear to auscultation and percussion with normal breathing effort HEART: regular rate & rhythm and no murmurs and no lower extremity edema ABDOMEN:abdomen soft, non-tender and normal bowel sounds Musculoskeletal:no  cyanosis of digits and no clubbing  NEURO: alert & oriented x 3 with fluent speech, no focal motor/sensory deficits  LABORATORY DATA:  I have reviewed the data as listed CMP Latest Ref Rng & Units 10/17/2018 10/16/2018 10/15/2018  Glucose 70 - 99 mg/dL 97 94 -  BUN 8 - 23 mg/dL 23 27(H) -  Creatinine 0.61 - 1.24 mg/dL 1.56(H) 1.70(H) 1.70(H)  Sodium 135 - 145 mmol/L 138 138 -  Potassium 3.5 - 5.1 mmol/L 4.2 3.8 -  Chloride 98 - 111 mmol/L 111 107 -  CO2 22 - 32 mmol/L 20(L) 20(L) -  Calcium 8.9 - 10.3 mg/dL 8.1(L) 8.6(L) -  Total Protein 6.5 - 8.1 g/dL - - -  Total Bilirubin 0.3 - 1.2 mg/dL - - -  Alkaline Phos 38 - 126 U/L - - -  AST 15 - 41 U/L - - -  ALT 17 - 63 U/L - - -    Lab Results  Component Value Date   WBC 32.8 (H) 10/17/2018   HGB 10.6 (L) 10/17/2018   HCT 33.7 (L) 10/17/2018   MCV 94.1 10/17/2018   PLT 24 (LL) 10/17/2018   NEUTROABS 16.6 (H) 10/15/2018    ASSESSMENT AND PLAN: 1. CLL ? Review of the peripheral blood smear is consistent with chronic lymphocytic leukemia ? 12/16/2014 Peripheral blood flow cytometry consistent with CLL ? 10/16/2018 MRI brain and cervical spine - Prominent bulky cervical adenopathy within the visualized neck, compatible with history of CLL. 2.thrombocytopenia-likely secondary to chronic lymphocytic leukemia versus "pseudo thrombocytopenia"due to platelet clumping; progressive thrombocytopenia June 2018 3.Hospitalization with sepsis/UTI with bacteremia (Enterobacter aerogenes)March  2018 4. Hospitalization for syncopal episode resulting in fall. Developed a small right subarachnoid hemorrhage.  5. MRI brain 10/16/2018 - Focal 1-2 cm osseous lesions involving the right parietal and occipital calvarium as above, indeterminate. Findings are of uncertain significance, with no definite corresponding osseous lesion seen on prior CT.  Mr. Sinning chart has been reviewed. He had a syncopal episode resulting in a fall and small right  subarachnoid hemorrhage. He has a diagnosis of CLL and has had persistent leukocytosis, thrombocytopenia, and mild anemia.  MRI of the neck shows prominent bulky cervical adenopathy within the neck compatible with history of CLL.  MRI of the brain shows 1 to 2 cm osseous lesions involving the right parietal and occipital calvarium which are indeterminate.  1.  Leukocytosis and thrombocytopenia are overall stable.  White blood cell count is trending downward.  He has bulky adenopathy in his neck.  As discussed at his last office visit, we could consider treatment with ibrutinib and acalabrutinib.  We can have further discussions about this as an outpatient. 2.  We can initiate systemic treatment for CLL in order to raise the platelet count to an acceptable level for him to receive antiplatelet therapy.  We can transfuse platelets and give IVIG if an acute rise in the platelet count is needed. 3.  Continue evaluation for coronary artery disease per cardiology. 4.  Subarachnoid hemorrhage is being followed by neurosurgery.  Remained stable based on the CT scan performed yesterday.  Mikey Bussing, DNP, AGPCNP-BC, AOCNP   Mr. Busche appears unchanged. No apparent bleeding.  I discussed the case with Cardiology.  He as been started on aspirin.  The platelets are stable.  The thombocytopenia is either due to CLL or CLL associated ITP.  I recommend a trial of prednisone. I reviewed toxicities associated with prednisone and he agrees to proceed.  We will begin systemic therapy for the CLL with acalabrutinib or venetoclax if the thrombocytopenia does not respond to prednisone.

## 2018-10-17 NOTE — Progress Notes (Signed)
Midline placed r/t CT coronary, 18 gauge used per protocol.Patient tolerated the procedure. Advised primary nurse Sam that site can"t be used for Sodium bicarb r/t the risk of extravasation, RN verbalized understanding and will also make a note re: this matter and will endorsed to floor nurses.    Sandie Ano RN IV VAST

## 2018-10-17 NOTE — Progress Notes (Signed)
PROGRESS NOTE    [name]  TKZ:[id number] DOB: 1941/06/08 DOA: 10/15/2018 PCP: [name], MD  Brief Narrative: 78 year old male with history of nonobstructive CAD, hypertension, dyslipidemia, CLL, chronic thrombocytopenia presented to the emergency room with Chest tightness and shortness of breath, followed by syncope. -In the emergency room he was found to have a white count of 47,000, platelets with 30 K, creatinine of 1.7, troponin with 0.07, CT angiogram was negative for pulmonary embolism. -CT head noted trace posttraumatic subarachnoid hemorrhage, comminuted impacted bilateral nasal bone fractures, probable associated fracture of anterior superior nasal septum and anterior right frontal ethmoidal confluence -MRI brain noted small volume acute posttraumatic subarachnoid hemorrhage focal 1 to 2 cm osseous lesions which were indeterminate soft tissue contusions and facial fractures. -MRI C-spine noted multilevel cervical spondylosis, moderate spinal stenosis and bulky adenopathy consistent with history of CLL  Assessment & Plan:   Syncope/unstable angina -EKG was unremarkable, echocardiogram noted normal EF of 65% no valvular heart disease, no wall motion abnormality -Prior cath in 2015 noted moderate LAD disease however situation complicated by CLL/thrombocytopenia and renal insufficiency -Cardiology following -Plan for coronary CTA today -If he needs cardiac intervention and stenting he would need IVIG steroids, platelet transfusion per oncology prior to intervention -Continue aspirin, bisoprolol, simvastatin  Subarachnoid hemorrhage -CT and MRI noted small/trace posttraumatic subarachnoid hemorrhage along medial aspect of right superior frontal and right wrist -Appreciate neurosurgery input, recommended conservative management -Repeat CT head appears unremarkable  Acute kidney injury -Creatinine was 1.8, down to 1.5 today -Baseline creatinine is around 1.35 -will cut  down IV fluids  CLL/severe thrombocytopenia -Asymptomatic, followed by oncology Dr. Ammie Dalton -Has bulky lymphadenopathy and slight worsening of his chronic thrombocytopenia -Dr. Ammie Dalton following, not ideal candidate for dual antiplatelets if he needs cardiac intervention, but if necessary will need planned intervention with IVIG, steroids, platelet transfusions to increase counts  Nasal bone fractures -ENT Dr. Wilburn Cornelia was consulted by EDP -Recommended Unasyn for prophylaxis and outpatient follow-up -Local wound care and pain control   Hypertension -Continue bisoprolol, HCTZ on hold  DVT prophylaxis: None due to subarachnoid hemorrhage and severe thrombocytopenia Code Status: Full code Family Communication: No family at bedside will update daughter later today Disposition Plan: Home pending above work-up  Consultants:   Neurosurgery  Cardiology   Procedures:   Antimicrobials:    Subjective: -Complains of some pain in both hands, no weakness or numbness -Denies any chest pain or shortness of breath  Objective: Vitals:   10/16/18 1909 10/17/18 0026 10/17/18 0350 10/17/18 0723  BP: 132/60 (!) 124/51 (!) 141/66 (!) 129/54  Pulse: (!) 57 60 63 (!) 58  Resp: 17 18 16 18   Temp: 98.3 F (36.8 C) 98.8 F (37.1 C) 98.1 F (36.7 C) 98.1 F (36.7 C)  TempSrc: Oral Oral Oral Oral  SpO2: 100% 100% 97% 97%  Weight:   75.5 kg   Height:        Intake/Output Summary (Last 24 hours) at 10/17/2018 1047 Last data filed at 10/17/2018 0026 Gross per 24 hour  Intake --  Output 200 ml  Net -200 ml   Filed Weights   10/15/18 1731 10/16/18 0025 10/17/18 0350  Weight: 74.4 kg 73.8 kg 75.5 kg    Examination:  General exam: Awake alert, sitting up in bed, no distress  HEENT: Bruising ecchymosis and swelling of the bridge of the nose and surrounding zygoma Respiratory system: Clear bilaterally Cardiovascular system: S1 & S2 heard, RRR. Gastrointestinal system: Abdomen is  nondistended, soft  and nontender.Normal bowel sounds heard. Central nervous system: Alert and oriented. No focal neurological deficits. Extremities: No edema Skin: No rashes, lesions or ulcers Psychiatry: Judgement and insight appear normal. Mood & affect appropriate.     Data Reviewed:   CBC: Recent Labs  Lab 10/15/18 1806 10/16/18 0339 10/17/18 0533  WBC 47.3* 39.7* 32.8*  NEUTROABS 16.6*  --   --   HGB 12.5* 11.8* 10.6*  HCT 39.1 36.1* 33.7*  MCV 93.3 92.6 94.1  PLT 36* 29* 24*   Basic Metabolic Panel: Recent Labs  Lab 10/15/18 1806 10/15/18 1815 10/16/18 0339 10/17/18 0533  NA 139  --  138 138  K 3.5  --  3.8 4.2  CL 104  --  107 111  CO2 21*  --  20* 20*  GLUCOSE 101*  --  94 97  BUN 28*  --  27* 23  CREATININE 1.84* 1.70* 1.70* 1.56*  CALCIUM 9.2  --  8.6* 8.1*   GFR: Estimated Creatinine Clearance: 36.1 mL/min (A) (by C-G formula based on SCr of 1.56 mg/dL (H)). Liver Function Tests: No results for input(s): AST, ALT, ALKPHOS, BILITOT, PROT, ALBUMIN in the last 168 hours. No results for input(s): LIPASE, AMYLASE in the last 168 hours. No results for input(s): AMMONIA in the last 168 hours. Coagulation Profile: No results for input(s): INR, PROTIME in the last 168 hours. Cardiac Enzymes: Recent Labs  Lab 10/15/18 1806 10/15/18 2335 10/16/18 0339 10/16/18 0944  TROPONINI <0.03 0.07* 0.10* 0.09*   BNP (last 3 results) No results for input(s): PROBNP in the last 8760 hours. HbA1C: Recent Labs    10/16/18 0339  HGBA1C 5.4   CBG: No results for input(s): GLUCAP in the last 168 hours. Lipid Profile: Recent Labs    10/16/18 0339  CHOL 106  HDL 20*  LDLCALC 61  TRIG 125  CHOLHDL 5.3   Thyroid Function Tests: No results for input(s): TSH, T4TOTAL, FREET4, T3FREE, THYROIDAB in the last 72 hours. Anemia Panel: No results for input(s): VITAMINB12, FOLATE, FERRITIN, TIBC, IRON, RETICCTPCT in the last 72 hours. Urine analysis:    Component  Value Date/Time   COLORURINE YELLOW 10/15/2018 1946   APPEARANCEUR CLEAR 10/15/2018 1946   LABSPEC 1.016 10/15/2018 1946   PHURINE 6.0 10/15/2018 1946   GLUCOSEU NEGATIVE 10/15/2018 1946   HGBUR SMALL (A) 10/15/2018 1946   BILIRUBINUR NEGATIVE 10/15/2018 1946   KETONESUR 5 (A) 10/15/2018 1946   PROTEINUR NEGATIVE 10/15/2018 1946   NITRITE NEGATIVE 10/15/2018 1946   LEUKOCYTESUR NEGATIVE 10/15/2018 1946   Sepsis Labs: @LABRCNTIP (procalcitonin:4,lacticidven:4)  )No results found for this or any previous visit (from the past 240 hour(s)).       Radiology Studies: Ct Head Wo Contrast  Result Date: 10/16/2018 CLINICAL DATA:  Follow-up subarachnoid hemorrhage. EXAM: CT HEAD WITHOUT CONTRAST TECHNIQUE: Contiguous axial images were obtained from the base of the skull through the vertex without intravenous contrast. COMPARISON:  MRI same day.  CT 10/15/2018 FINDINGS: Brain: No increase in the small amount of post traumatic subarachnoid hemorrhage within a sulcus of the medial right frontal region. No evidence of acute brain injury otherwise. The hemorrhage is becoming less dense. No subdural hematoma. No sign of acute infarction. 13 mm meningioma at the left parietal vertex as seen previously. Vascular: There is atherosclerotic calcification of the major vessels at the base of the brain. Skull: No skull fracture. Comminuted nasal fractures as seen previously. Probable minor fracture of the lamina papyracea/medial orbital wall on the right. Sinuses/Orbits: Mucosal  inflammatory changes most notable affecting the right maxillary sinus. Small amount of fluid layering in the right frontal sinus. Probable minor fracture of lamina papyracea on the right. Other: None IMPRESSION: No increase in the small amount of post traumatic subarachnoid hemorrhage on the right. The hemorrhage is becoming less distinct. No development of any other bleeding. Left parietal vertex 13 mm meningioma as seen previously.  Comminuted nasal fractures as seen previously. Probable minimal fracture of the medial orbital wall on the right. Electronically Signed   By: Nelson Chimes M.D.   On: 10/16/2018 20:34   Ct Head Wo Contrast  Result Date: 10/15/2018 CLINICAL DATA:  78 year old male status post syncope and blunt trauma. Nose and forehead lacerations. EXAM: CT HEAD WITHOUT CONTRAST CT MAXILLOFACIAL WITHOUT CONTRAST CT CERVICAL SPINE WITHOUT CONTRAST TECHNIQUE: Multidetector CT imaging of the head, cervical spine, and maxillofacial structures were performed using the standard protocol without intravenous contrast. Multiplanar CT image reconstructions of the cervical spine and maxillofacial structures were also generated. COMPARISON:  Paranasal sinus CT 04/06/2011. FINDINGS: CT HEAD FINDINGS Brain: Trace subarachnoid hemorrhage along the medial aspect of the right superior frontal gyrus on series 3, image 20 and series 5, image 21. No subdural blood or other extra-axial hemorrhage identified. No intraventricular hemorrhage. No parenchymal hemorrhagic contusion identified. Partially calcified left frontoparietal convexity 11-13 millimeter meningioma suspected (series 3, image 26 and coronal image 52). No significant mass effect. No cerebral edema. Gray-white matter differentiation is within normal limits for age. No ventriculomegaly or intracranial mass effect. No cortically based acute infarct identified. Vascular: Calcified atherosclerosis at the skull base. No suspicious intracranial vascular hyperdensity. Skull: Calvarium appears intact. See facial bone findings below. Other: Forehead scalp hematoma tracks cephalad. See facial bone findings below. Other scalp soft tissues appear negative. CT MAXILLOFACIAL FINDINGS Osseous: Comminuted and impacted bilateral anterior nasal bone fractures with associated nasal bridge soft tissue swelling and soft tissue gas. The maxillary nasal processes remain intact. However, there is mild associated  deformity of the anterior superior nasal septum. There is also abnormal soft tissue gas in and around the right medial frontoethmoidal confluence, with a possible nondisplaced fracture there on series 8, image 64. Otherwise the anterior walls of the frontal sinuses appear intact. Nondisplaced fracture versus nutrient foramen of the right mandible in the parasymphyseal region best seen on series 13, image 42. Elsewhere the mandible appears intact and normally aligned, but there is superimposed poor dentition. No maxilla or zygoma fracture. Poor maxillary dentition posteriorly. Central skull base intact. Pterygoid plates intact. Orbits: Trace gas in the anterior superior right orbit suspicious for nondisplaced lamina papyracea fracture there on series 8, image 70 and series 12, image 33. Other orbital walls are intact. Globes are intact. There is preseptal soft tissue swelling/hematoma, but no intraorbital hematoma. No other intraorbital gas. Sinuses: Trace hemorrhage layering in the right frontal sinus (series 6, image 68) associated with the nondisplaced frontoethmoidal fractures described above. Mild right anterior ethmoid bladder mucosal thickening. Minimal to mild paranasal sinus mucosal thickening elsewhere. Small volume retained secretions in the nasal cavity. Leftward anterior nasal septal deviation may be acute and associated with the nasal bone fractures described earlier. Negative nasal cavity otherwise. Tympanic cavities and mastoids are clear. Soft tissues: Negative visible noncontrast larynx, pharynx, parapharyngeal spaces, retropharyngeal space, sublingual space, submandibular and parotid glands. There is moderate bilateral level 1, level 2, and visible level 3 lymphadenopathy with enlarged individual nodes up to 17 millimeters short axis. CT CERVICAL SPINE FINDINGS Alignment: Mild reversal of  cervical lordosis. Cervicothoracic junction alignment is within normal limits. Bilateral posterior element  alignment is within normal limits. Skull base and vertebrae: Visualized skull base is intact. No atlanto-occipital dissociation. No acute osseous abnormality identified. Soft tissues and spinal canal: No prevertebral fluid or swelling. No visible canal hematoma. Generalized bilateral cervical lymphadenopathy continuing to the thoracic inlet (series 15, image 64). Disc levels: Congenital incomplete segmentation versus acquired ankylosis of C6-C7. Advanced cervical disc and endplate degeneration elsewhere. At least mild multifactorial spinal stenosis at C4-C5 and C5-C6. Upper chest: Negative lung apices. Grossly intact visible upper thoracic levels. IMPRESSION: 1. Positive for trace posttraumatic subarachnoid hemorrhage along the medial aspect of the right superior frontal gyrus. No other acute traumatic injury to the brain identified. 2. Positive for generalized cervical Lymphadenopathy continuing to the thoracic inlet. On discussion with Dr. Tyrone Nine in the ED he advises the patient has chronic lymphocytic leukemia (CLL) which explains this appearance. 3. Comminuted and impacted bilateral nasal bone fractures. Probable associated fracture of the anterior superior nasal septum. Nondisplaced fracture of the anterior right frontoethmoidal confluence suspected, with mild regional soft tissue gas. Nondisplaced fracture of the right anterior lamina papyracea with trace gas in the anterior superior right orbit. No intraorbital hematoma. 4. Probable nutrient foramen of the right mandible parasymphysis, less likely a nondisplaced fracture (series 13, image 42). 5. No acute fracture in the cervical spine. 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 Study discussed by telephone with Dr. Tyrone Nine on 10/15/2018 at 19:50 . Electronically Signed   By: Genevie Ann M.D.   On: 10/15/2018 19:56   Ct Angio Chest Pe W And/or Wo Contrast  Result Date: 10/15/2018 CLINICAL DATA:   78 year old male status post syncope and blunt trauma. Chest tightness, shortness of breath. CLL. EXAM: CT ANGIOGRAPHY CHEST WITH CONTRAST TECHNIQUE: Multidetector CT imaging of the chest was performed using the standard protocol during bolus administration of intravenous contrast. Multiplanar CT image reconstructions and MIPs were obtained to evaluate the vascular anatomy. CONTRAST:  26mL OMNIPAQUE IOHEXOL 350 MG/ML SOLN COMPARISON:  Head face and cervical spine CT today reported separately. Chest radiographs 08/17/2016. Chest CT 03/16/2009. FINDINGS: Cardiovascular: Good contrast bolus timing in the pulmonary arterial tree. Mild respiratory motion. No focal filling defect identified in the pulmonary arteries to suggest acute pulmonary embolism. Negative visible aorta aside from mild atherosclerosis. Mild cardiomegaly. No pericardial effusion. Mediastinum/Nodes: Lower cervical lymphadenopathy continuing to the thoracic inlet as seen on the face and cervical spine imaging today. There is mild to moderate mediastinal and hilar lymphadenopathy, new since 2010. Individual nodes measure up to 12 millimeter short axis. Lungs/Pleura: Major airways are patent. No pneumothorax or pleural effusion. No pulmonary contusion. Mild chronic scarring in the lingula and medial segment of the right middle lobe. There is a round 10-11 millimeter anterior basal segment left lower lobe lung nodule which was 7 millimeters in 2010. This is most likely benign. Upper Abdomen: Partially visible splenomegaly, new since 2010. Negative visible liver and bowel in the upper abdomen. Musculoskeletal: No rib or sternal fracture identified. Thoracic spine degeneration. No acute osseous abnormality identified. Review of the MIP images confirms the above findings. IMPRESSION: 1. No evidence of acute pulmonary embolus. No acute traumatic injury identified in the chest. 2. History of CLL with evidence of active disease: splenomegaly, cervical and  mediastinal lymphadenopathy. 3. No acute pulmonary abnormality. A solitary lung nodule in the left lower lobe has minimally increased from 7 to 11 mm over the  past 10 years compatible with benign etiology. 4. Mild cardiomegaly. Electronically Signed   By: Genevie Ann M.D.   On: 10/15/2018 20:01   Ct Cervical Spine Wo Contrast  Result Date: 10/15/2018 CLINICAL DATA:  78 year old male status post syncope and blunt trauma. Nose and forehead lacerations. EXAM: CT HEAD WITHOUT CONTRAST CT MAXILLOFACIAL WITHOUT CONTRAST CT CERVICAL SPINE WITHOUT CONTRAST TECHNIQUE: Multidetector CT imaging of the head, cervical spine, and maxillofacial structures were performed using the standard protocol without intravenous contrast. Multiplanar CT image reconstructions of the cervical spine and maxillofacial structures were also generated. COMPARISON:  Paranasal sinus CT 04/06/2011. FINDINGS: CT HEAD FINDINGS Brain: Trace subarachnoid hemorrhage along the medial aspect of the right superior frontal gyrus on series 3, image 20 and series 5, image 21. No subdural blood or other extra-axial hemorrhage identified. No intraventricular hemorrhage. No parenchymal hemorrhagic contusion identified. Partially calcified left frontoparietal convexity 11-13 millimeter meningioma suspected (series 3, image 26 and coronal image 52). No significant mass effect. No cerebral edema. Gray-white matter differentiation is within normal limits for age. No ventriculomegaly or intracranial mass effect. No cortically based acute infarct identified. Vascular: Calcified atherosclerosis at the skull base. No suspicious intracranial vascular hyperdensity. Skull: Calvarium appears intact. See facial bone findings below. Other: Forehead scalp hematoma tracks cephalad. See facial bone findings below. Other scalp soft tissues appear negative. CT MAXILLOFACIAL FINDINGS Osseous: Comminuted and impacted bilateral anterior nasal bone fractures with associated nasal bridge  soft tissue swelling and soft tissue gas. The maxillary nasal processes remain intact. However, there is mild associated deformity of the anterior superior nasal septum. There is also abnormal soft tissue gas in and around the right medial frontoethmoidal confluence, with a possible nondisplaced fracture there on series 8, image 64. Otherwise the anterior walls of the frontal sinuses appear intact. Nondisplaced fracture versus nutrient foramen of the right mandible in the parasymphyseal region best seen on series 13, image 42. Elsewhere the mandible appears intact and normally aligned, but there is superimposed poor dentition. No maxilla or zygoma fracture. Poor maxillary dentition posteriorly. Central skull base intact. Pterygoid plates intact. Orbits: Trace gas in the anterior superior right orbit suspicious for nondisplaced lamina papyracea fracture there on series 8, image 70 and series 12, image 33. Other orbital walls are intact. Globes are intact. There is preseptal soft tissue swelling/hematoma, but no intraorbital hematoma. No other intraorbital gas. Sinuses: Trace hemorrhage layering in the right frontal sinus (series 6, image 68) associated with the nondisplaced frontoethmoidal fractures described above. Mild right anterior ethmoid bladder mucosal thickening. Minimal to mild paranasal sinus mucosal thickening elsewhere. Small volume retained secretions in the nasal cavity. Leftward anterior nasal septal deviation may be acute and associated with the nasal bone fractures described earlier. Negative nasal cavity otherwise. Tympanic cavities and mastoids are clear. Soft tissues: Negative visible noncontrast larynx, pharynx, parapharyngeal spaces, retropharyngeal space, sublingual space, submandibular and parotid glands. There is moderate bilateral level 1, level 2, and visible level 3 lymphadenopathy with enlarged individual nodes up to 17 millimeters short axis. CT CERVICAL SPINE FINDINGS Alignment: Mild  reversal of cervical lordosis. Cervicothoracic junction alignment is within normal limits. Bilateral posterior element alignment is within normal limits. Skull base and vertebrae: Visualized skull base is intact. No atlanto-occipital dissociation. No acute osseous abnormality identified. Soft tissues and spinal canal: No prevertebral fluid or swelling. No visible canal hematoma. Generalized bilateral cervical lymphadenopathy continuing to the thoracic inlet (series 15, image 64). Disc levels: Congenital incomplete segmentation versus acquired ankylosis of C6-C7. Advanced  cervical disc and endplate degeneration elsewhere. At least mild multifactorial spinal stenosis at C4-C5 and C5-C6. Upper chest: Negative lung apices. Grossly intact visible upper thoracic levels. IMPRESSION: 1. Positive for trace posttraumatic subarachnoid hemorrhage along the medial aspect of the right superior frontal gyrus. No other acute traumatic injury to the brain identified. 2. Positive for generalized cervical Lymphadenopathy continuing to the thoracic inlet. On discussion with Dr. Tyrone Nine in the ED he advises the patient has chronic lymphocytic leukemia (CLL) which explains this appearance. 3. Comminuted and impacted bilateral nasal bone fractures. Probable associated fracture of the anterior superior nasal septum. Nondisplaced fracture of the anterior right frontoethmoidal confluence suspected, with mild regional soft tissue gas. Nondisplaced fracture of the right anterior lamina papyracea with trace gas in the anterior superior right orbit. No intraorbital hematoma. 4. Probable nutrient foramen of the right mandible parasymphysis, less likely a nondisplaced fracture (series 13, image 42). 5. No acute fracture in the cervical spine. 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 Study discussed by telephone with Dr. Tyrone Nine on 10/15/2018 at 19:50 . Electronically Signed   By: Genevie Ann  M.D.   On: 10/15/2018 19:56   Mr Brain Wo Contrast (neuro Protocol)  Result Date: 10/16/2018 CLINICAL DATA:  Initial evaluation for acute syncope, bilateral hand tingling. EXAM: MRI HEAD WITHOUT CONTRAST MRI CERVICAL SPINE WITHOUT CONTRAST TECHNIQUE: Multiplanar, multiecho pulse sequences of the brain and surrounding structures, and cervical spine, to include the craniocervical junction and cervicothoracic junction, were obtained without intravenous contrast. COMPARISON:  Prior CT from 10/15/2018 FINDINGS: MRI HEAD FINDINGS Brain: Cerebral volume within normal limits. No significant cerebral white matter changes for age. No abnormal foci of restricted diffusion to suggest acute or subacute ischemia. Gray-white matter differentiation maintained. No encephalomalacia to suggest chronic cortical infarction. Scattered small volume susceptibility artifact overlying the medial aspect of the right superior frontal gyrus compatible with small volume posttraumatic subarachnoid hemorrhage, stable from prior CT. No other new or progressive intracranial hemorrhage. Trace layering blood products noted within the occipital horns of both lateral ventricles, compatible with redistribution. 13 mm meningioma overlies the parasagittal left parietal convexity (series 11, image 20). No associated edema or mass effect. No other mass lesion. No midline shift or mass effect. No hydrocephalus. No extra-axial fluid collection. Pituitary gland suprasellar region normal. Midline structures intact and normal. Vascular: Major intracranial vascular flow voids are well maintained. Skull and upper cervical spine: Craniocervical junction normal. Bone marrow signal intensity within normal limits. Focal 17 mm T1 hypointense lesion present within the right parietal calvarium (series 9, image 6). Additional possible 2 cm lesion posteriorly within the left occipital calvarium (series 9, image 15). Findings are indeterminate. No corresponding osseous  lesion seen on prior CT. Soft tissue swelling seen at the forehead, extending inferiorly to involve the nasal bridge and nose, also seen on prior maxillofacial CT. Associated maxillofacial fractures better evaluated on prior exam. Sinuses/Orbits: Globes and orbital soft tissues within normal limits. Probable right orbital fracture better seen on prior CT. Scattered mucosal thickening throughout the ethmoidal air cells and maxillary sinuses. No significant mastoid effusion. Inner ear structures normal. Other: None. MRI CERVICAL SPINE FINDINGS Alignment: Straightening with slight reversal of the normal cervical lordosis, apex at C5-6. Trace anterolisthesis of C4 on C5, likely chronic and facet mediated. No malalignment. Vertebrae: Vertebral body heights maintained without evidence for acute or chronic fracture. C6 and C7 vertebral bodies are largely ankylosed. Underlying bone marrow signal intensity somewhat diffusely decreased  on T1 weighted imaging, suspected to be related history of CLL. No discrete or worrisome osseous lesions. Cord: Signal intensity within the cervical spinal cord is normal. No evidence for traumatic cord injury or myelomalacia. Posterior Fossa, vertebral arteries, paraspinal tissues: Craniocervical junction normal. Paraspinous and prevertebral soft tissues demonstrate no acute finding. No evidence for ligamentous injury. Prominent bilateral cervical adenopathy again noted, compatible with history of CLL. Normal intravascular flow voids seen within the vertebral arteries bilaterally. Disc levels: C2-C3: Mild bilateral uncovertebral and facet hypertrophy. No significant spinal stenosis. Mild right C3 foraminal narrowing. C3-C4: Broad-based posterior disc protrusion indents the ventral thecal sac. Mild facet and ligament flavum hypertrophy. Mild spinal stenosis without cord deformity. Superimposed uncovertebral hypertrophy with resultant moderate right worse than left C4 foraminal stenosis. C4-C5:  Mild diffuse disc bulge with uncovertebral hypertrophy. Superimposed facet and ligament flavum hypertrophy. Resultant moderate spinal stenosis without significant cord deformity. Severe left with moderate right C5 foraminal narrowing. C5-C6: Diffuse circumferential disc osteophyte with intervertebral disc space narrowing. Superimposed facet and ligament flavum hypertrophy. Resultant severe spinal stenosis with mild cord flattening. No cord signal changes. Thecal sac measures 7 mm in AP diameter. Severe bilateral C6 foraminal narrowing, right worse than left. C6-C7: C6 and C7 vertebral bodies are partially ankylosed. Associated endplate osseous ridging with uncovertebral hypertrophy. Flattening of the ventral thecal sac without significant spinal stenosis. Moderate right with mild left C7 foraminal stenosis. C7-T1: Mild disc bulge with right-sided uncovertebral hypertrophy. Mild right-sided facet degeneration. No spinal stenosis. Mild right C8 foraminal stenosis. Visualized upper thoracic spine demonstrates no significant finding. IMPRESSION: MRI HEAD IMPRESSION: 1. Small volume acute posttraumatic subarachnoid hemorrhage involving the medial right frontal lobe, stable from prior CT. No evidence for new or progressive hemorrhage. 2. No other acute intracranial abnormality. 3. Focal 1-2 cm osseous lesions involving the right parietal and occipital calvarium as above, indeterminate. Findings are of uncertain significance, with no definite corresponding osseous lesion seen on prior CT. Correlation with dedicated bone scan suggested for further evaluation. 4. Soft tissue contusion involving the forehead and central face with associated facial fractures, better seen on prior CT. 5. 13 mm meningioma overlying the left parietal convexity without associated edema. MRI CERVICAL SPINE IMPRESSION: 1. No acute traumatic injury or other finding within the cervical spine status post recent syncope and fall. No evidence for spinal  cord or ligamentous injury. 2. Moderate multilevel cervical spondylolysis with resultant moderate spinal stenosis at C4-5 and more severe narrowing at C5-6. 3. Multifactorial degenerative changes with resultant multilevel foraminal narrowing as above. Notable findings include moderate bilateral C4 foraminal stenosis, severe left with moderate right C5 foraminal narrowing, severe bilateral C6 foraminal stenosis, with moderate right C7 foraminal narrowing. 4. Prominent bulky cervical adenopathy within the visualized neck, compatible with history of CLL. Electronically Signed   By: Jeannine Boga M.D.   On: 10/16/2018 02:32   Mr Cervical Spine Wo Contrast  Result Date: 10/16/2018 CLINICAL DATA:  Initial evaluation for acute syncope, bilateral hand tingling. EXAM: MRI HEAD WITHOUT CONTRAST MRI CERVICAL SPINE WITHOUT CONTRAST TECHNIQUE: Multiplanar, multiecho pulse sequences of the brain and surrounding structures, and cervical spine, to include the craniocervical junction and cervicothoracic junction, were obtained without intravenous contrast. COMPARISON:  Prior CT from 10/15/2018 FINDINGS: MRI HEAD FINDINGS Brain: Cerebral volume within normal limits. No significant cerebral white matter changes for age. No abnormal foci of restricted diffusion to suggest acute or subacute ischemia. Gray-white matter differentiation maintained. No encephalomalacia to suggest chronic cortical infarction. Scattered small volume susceptibility  artifact overlying the medial aspect of the right superior frontal gyrus compatible with small volume posttraumatic subarachnoid hemorrhage, stable from prior CT. No other new or progressive intracranial hemorrhage. Trace layering blood products noted within the occipital horns of both lateral ventricles, compatible with redistribution. 13 mm meningioma overlies the parasagittal left parietal convexity (series 11, image 20). No associated edema or mass effect. No other mass lesion. No  midline shift or mass effect. No hydrocephalus. No extra-axial fluid collection. Pituitary gland suprasellar region normal. Midline structures intact and normal. Vascular: Major intracranial vascular flow voids are well maintained. Skull and upper cervical spine: Craniocervical junction normal. Bone marrow signal intensity within normal limits. Focal 17 mm T1 hypointense lesion present within the right parietal calvarium (series 9, image 6). Additional possible 2 cm lesion posteriorly within the left occipital calvarium (series 9, image 15). Findings are indeterminate. No corresponding osseous lesion seen on prior CT. Soft tissue swelling seen at the forehead, extending inferiorly to involve the nasal bridge and nose, also seen on prior maxillofacial CT. Associated maxillofacial fractures better evaluated on prior exam. Sinuses/Orbits: Globes and orbital soft tissues within normal limits. Probable right orbital fracture better seen on prior CT. Scattered mucosal thickening throughout the ethmoidal air cells and maxillary sinuses. No significant mastoid effusion. Inner ear structures normal. Other: None. MRI CERVICAL SPINE FINDINGS Alignment: Straightening with slight reversal of the normal cervical lordosis, apex at C5-6. Trace anterolisthesis of C4 on C5, likely chronic and facet mediated. No malalignment. Vertebrae: Vertebral body heights maintained without evidence for acute or chronic fracture. C6 and C7 vertebral bodies are largely ankylosed. Underlying bone marrow signal intensity somewhat diffusely decreased on T1 weighted imaging, suspected to be related history of CLL. No discrete or worrisome osseous lesions. Cord: Signal intensity within the cervical spinal cord is normal. No evidence for traumatic cord injury or myelomalacia. Posterior Fossa, vertebral arteries, paraspinal tissues: Craniocervical junction normal. Paraspinous and prevertebral soft tissues demonstrate no acute finding. No evidence for  ligamentous injury. Prominent bilateral cervical adenopathy again noted, compatible with history of CLL. Normal intravascular flow voids seen within the vertebral arteries bilaterally. Disc levels: C2-C3: Mild bilateral uncovertebral and facet hypertrophy. No significant spinal stenosis. Mild right C3 foraminal narrowing. C3-C4: Broad-based posterior disc protrusion indents the ventral thecal sac. Mild facet and ligament flavum hypertrophy. Mild spinal stenosis without cord deformity. Superimposed uncovertebral hypertrophy with resultant moderate right worse than left C4 foraminal stenosis. C4-C5: Mild diffuse disc bulge with uncovertebral hypertrophy. Superimposed facet and ligament flavum hypertrophy. Resultant moderate spinal stenosis without significant cord deformity. Severe left with moderate right C5 foraminal narrowing. C5-C6: Diffuse circumferential disc osteophyte with intervertebral disc space narrowing. Superimposed facet and ligament flavum hypertrophy. Resultant severe spinal stenosis with mild cord flattening. No cord signal changes. Thecal sac measures 7 mm in AP diameter. Severe bilateral C6 foraminal narrowing, right worse than left. C6-C7: C6 and C7 vertebral bodies are partially ankylosed. Associated endplate osseous ridging with uncovertebral hypertrophy. Flattening of the ventral thecal sac without significant spinal stenosis. Moderate right with mild left C7 foraminal stenosis. C7-T1: Mild disc bulge with right-sided uncovertebral hypertrophy. Mild right-sided facet degeneration. No spinal stenosis. Mild right C8 foraminal stenosis. Visualized upper thoracic spine demonstrates no significant finding. IMPRESSION: MRI HEAD IMPRESSION: 1. Small volume acute posttraumatic subarachnoid hemorrhage involving the medial right frontal lobe, stable from prior CT. No evidence for new or progressive hemorrhage. 2. No other acute intracranial abnormality. 3. Focal 1-2 cm osseous lesions involving the right  parietal and  occipital calvarium as above, indeterminate. Findings are of uncertain significance, with no definite corresponding osseous lesion seen on prior CT. Correlation with dedicated bone scan suggested for further evaluation. 4. Soft tissue contusion involving the forehead and central face with associated facial fractures, better seen on prior CT. 5. 13 mm meningioma overlying the left parietal convexity without associated edema. MRI CERVICAL SPINE IMPRESSION: 1. No acute traumatic injury or other finding within the cervical spine status post recent syncope and fall. No evidence for spinal cord or ligamentous injury. 2. Moderate multilevel cervical spondylolysis with resultant moderate spinal stenosis at C4-5 and more severe narrowing at C5-6. 3. Multifactorial degenerative changes with resultant multilevel foraminal narrowing as above. Notable findings include moderate bilateral C4 foraminal stenosis, severe left with moderate right C5 foraminal narrowing, severe bilateral C6 foraminal stenosis, with moderate right C7 foraminal narrowing. 4. Prominent bulky cervical adenopathy within the visualized neck, compatible with history of CLL. Electronically Signed   By: Jeannine Boga M.D.   On: 10/16/2018 02:32   Ct Maxillofacial Wo Cm  Result Date: 10/15/2018 CLINICAL DATA:  78 year old male status post syncope and blunt trauma. Nose and forehead lacerations. EXAM: CT HEAD WITHOUT CONTRAST CT MAXILLOFACIAL WITHOUT CONTRAST CT CERVICAL SPINE WITHOUT CONTRAST TECHNIQUE: Multidetector CT imaging of the head, cervical spine, and maxillofacial structures were performed using the standard protocol without intravenous contrast. Multiplanar CT image reconstructions of the cervical spine and maxillofacial structures were also generated. COMPARISON:  Paranasal sinus CT 04/06/2011. FINDINGS: CT HEAD FINDINGS Brain: Trace subarachnoid hemorrhage along the medial aspect of the right superior frontal gyrus on series  3, image 20 and series 5, image 21. No subdural blood or other extra-axial hemorrhage identified. No intraventricular hemorrhage. No parenchymal hemorrhagic contusion identified. Partially calcified left frontoparietal convexity 11-13 millimeter meningioma suspected (series 3, image 26 and coronal image 52). No significant mass effect. No cerebral edema. Gray-white matter differentiation is within normal limits for age. No ventriculomegaly or intracranial mass effect. No cortically based acute infarct identified. Vascular: Calcified atherosclerosis at the skull base. No suspicious intracranial vascular hyperdensity. Skull: Calvarium appears intact. See facial bone findings below. Other: Forehead scalp hematoma tracks cephalad. See facial bone findings below. Other scalp soft tissues appear negative. CT MAXILLOFACIAL FINDINGS Osseous: Comminuted and impacted bilateral anterior nasal bone fractures with associated nasal bridge soft tissue swelling and soft tissue gas. The maxillary nasal processes remain intact. However, there is mild associated deformity of the anterior superior nasal septum. There is also abnormal soft tissue gas in and around the right medial frontoethmoidal confluence, with a possible nondisplaced fracture there on series 8, image 64. Otherwise the anterior walls of the frontal sinuses appear intact. Nondisplaced fracture versus nutrient foramen of the right mandible in the parasymphyseal region best seen on series 13, image 42. Elsewhere the mandible appears intact and normally aligned, but there is superimposed poor dentition. No maxilla or zygoma fracture. Poor maxillary dentition posteriorly. Central skull base intact. Pterygoid plates intact. Orbits: Trace gas in the anterior superior right orbit suspicious for nondisplaced lamina papyracea fracture there on series 8, image 70 and series 12, image 33. Other orbital walls are intact. Globes are intact. There is preseptal soft tissue  swelling/hematoma, but no intraorbital hematoma. No other intraorbital gas. Sinuses: Trace hemorrhage layering in the right frontal sinus (series 6, image 68) associated with the nondisplaced frontoethmoidal fractures described above. Mild right anterior ethmoid bladder mucosal thickening. Minimal to mild paranasal sinus mucosal thickening elsewhere. Small volume retained secretions in the  nasal cavity. Leftward anterior nasal septal deviation may be acute and associated with the nasal bone fractures described earlier. Negative nasal cavity otherwise. Tympanic cavities and mastoids are clear. Soft tissues: Negative visible noncontrast larynx, pharynx, parapharyngeal spaces, retropharyngeal space, sublingual space, submandibular and parotid glands. There is moderate bilateral level 1, level 2, and visible level 3 lymphadenopathy with enlarged individual nodes up to 17 millimeters short axis. CT CERVICAL SPINE FINDINGS Alignment: Mild reversal of cervical lordosis. Cervicothoracic junction alignment is within normal limits. Bilateral posterior element alignment is within normal limits. Skull base and vertebrae: Visualized skull base is intact. No atlanto-occipital dissociation. No acute osseous abnormality identified. Soft tissues and spinal canal: No prevertebral fluid or swelling. No visible canal hematoma. Generalized bilateral cervical lymphadenopathy continuing to the thoracic inlet (series 15, image 64). Disc levels: Congenital incomplete segmentation versus acquired ankylosis of C6-C7. Advanced cervical disc and endplate degeneration elsewhere. At least mild multifactorial spinal stenosis at C4-C5 and C5-C6. Upper chest: Negative lung apices. Grossly intact visible upper thoracic levels. IMPRESSION: 1. Positive for trace posttraumatic subarachnoid hemorrhage along the medial aspect of the right superior frontal gyrus. No other acute traumatic injury to the brain identified. 2. Positive for generalized cervical  Lymphadenopathy continuing to the thoracic inlet. On discussion with Dr. Tyrone Nine in the ED he advises the patient has chronic lymphocytic leukemia (CLL) which explains this appearance. 3. Comminuted and impacted bilateral nasal bone fractures. Probable associated fracture of the anterior superior nasal septum. Nondisplaced fracture of the anterior right frontoethmoidal confluence suspected, with mild regional soft tissue gas. Nondisplaced fracture of the right anterior lamina papyracea with trace gas in the anterior superior right orbit. No intraorbital hematoma. 4. Probable nutrient foramen of the right mandible parasymphysis, less likely a nondisplaced fracture (series 13, image 42). 5. No acute fracture in the cervical spine. 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 Study discussed by telephone with Dr. Tyrone Nine on 10/15/2018 at 19:50 . Electronically Signed   By: Genevie Ann M.D.   On: 10/15/2018 19:56        Scheduled Meds:  sodium chloride   Intravenous Once   aspirin  81 mg Oral Pre-Cath   bisoprolol  10 mg Oral Daily   cholecalciferol  2,000 Units Oral Daily   lidocaine-EPINEPHrine  10 mL Infiltration Once   polymixin-bacitracin   Topical BID   simvastatin  20 mg Oral q1800   sodium chloride flush  3 mL Intravenous Q12H   sodium chloride flush  3 mL Intravenous Q12H   tamsulosin  0.4 mg Oral Daily   Continuous Infusions:  sodium chloride     ampicillin-sulbactam (UNASYN) IV 1.5 g (10/17/18 0934)     LOS: 1 day    Time spent: 32min    Domenic Polite, MD [facility]   10/17/2018, 10:47 AM

## 2018-10-17 NOTE — Progress Notes (Signed)
The patient's brain MRI demonstrates a small subarachnoid hemorrhage.  His cervical MRI demonstrates degenerative changes and stenosis.  There are no acute neurosurgical needs.  I will sign off.  If further neurosurgical assistance is needed please contact Dr. Sherley Bounds, who has previously done surgery on this patient.

## 2018-10-17 NOTE — Progress Notes (Signed)
Physical Therapy Treatment Patient Details Name: [name] MRN: [id number] DOB: 1941-04-02 Today's Date: 10/17/2018    History of Present Illness Pt is a 78 y/o male who presents s/p syncope and fall sustaining facial fractures and traumatic SAH. PMH significant for HTN, hepatitis, bilateral carpal tunnel syndrome, 1 level lumbar  lami/decompression on 06/27/2012.    PT Comments    Pt progressing towards physical therapy goals. Was able to perform transfers and ambulation with gross supervision for safety and RW for support. Pt continues to appear cautious but overall safe with RW. Will continue to follow and progress as able per POC.    Follow Up Recommendations  Home health PT;Supervision for mobility/OOB     Equipment Recommendations  Rolling walker with 5" wheels    Recommendations for Other Services       Precautions / Restrictions Precautions Precautions: Fall Restrictions Weight Bearing Restrictions: No    Mobility  Bed Mobility               General bed mobility comments: In recliner upon arrival  Transfers Overall transfer level: Needs assistance Equipment used: Rolling walker (2 wheeled) Transfers: Sit to/from Stand Sit to Stand: Supervision         General transfer comment: supervision for safety  Ambulation/Gait Ambulation/Gait assistance: Supervision Gait Distance (Feet): 500 Feet Assistive device: Rolling walker (2 wheeled) Gait Pattern/deviations: Step-through pattern;Decreased stride length;Trunk flexed Gait velocity: Decreased Gait velocity interpretation: 1.31 - 2.62 ft/sec, indicative of limited community ambulator General Gait Details: VC's for safety awareness in regards to IV lines. Therapist managed IV pole and pt was able to negotiate around room with RW and hands-on guarding on gait belt for safety. No overt LOB noted.    Stairs             Wheelchair Mobility    Modified Rankin (Stroke Patients Only)        Balance Overall balance assessment: Needs assistance Sitting-balance support: Feet supported;No upper extremity supported Sitting balance-Leahy Scale: Fair     Standing balance support: Single extremity supported;During functional activity Standing balance-Leahy Scale: Fair Standing balance comment: Pt standing at sink to brush teeth. He was able to stand statically with 1 UE on counter for support.                             Cognition Arousal/Alertness: Awake/alert Behavior During Therapy: WFL for tasks assessed/performed Overall Cognitive Status: Within Functional Limits for tasks assessed                                        Exercises      General Comments        Pertinent Vitals/Pain Pain Assessment: Faces Faces Pain Scale: Hurts little more Pain Location: Pt reports R hand hurts worst (pins and needles feeling); L hand less painful (pins and needles), and face pain reportedly mild Pain Descriptors / Indicators: Pins and needles Pain Intervention(s): Monitored during session    Home Living                      Prior Function            PT Goals (current goals can now be found in the care plan section) Acute Rehab PT Goals Patient Stated Goal: Home tomorrow PT Goal Formulation: With patient Time For  Goal Achievement: 10/23/18 Potential to Achieve Goals: Good Progress towards PT goals: Progressing toward goals    Frequency    Min 3X/week      PT Plan Current plan remains appropriate    Co-evaluation              AM-PAC PT "6 Clicks" Mobility   Outcome Measure  Help needed turning from your back to your side while in a flat bed without using bedrails?: None Help needed moving from lying on your back to sitting on the side of a flat bed without using bedrails?: None Help needed moving to and from a bed to a chair (including a wheelchair)?: None Help needed standing up from a chair using your arms (e.g.,  wheelchair or bedside chair)?: A Little Help needed to walk in hospital room?: A Little Help needed climbing 3-5 steps with a railing? : A Little 6 Click Score: 21    End of Session Equipment Utilized During Treatment: Cervical collar Activity Tolerance: Patient tolerated treatment well Patient left: in chair;with call bell/phone within reach;with chair alarm set Nurse Communication: Mobility status PT Visit Diagnosis: Unsteadiness on feet (R26.81);Difficulty in walking, not elsewhere classified (R26.2);Pain Pain - Right/Left: Right Pain - part of body: Hand(Most painful)     Time: 1117-3567 PT Time Calculation (min) (ACUTE ONLY): 21 min  Charges:  $Gait Training: 8-22 mins                     Rolinda Roan, PT, DPT Acute Rehabilitation Services Pager: [phone] Office: [phone]    Thelma Comp 10/17/2018, 2:04 PM

## 2018-10-17 NOTE — Progress Notes (Signed)
Pharmacy Antibiotic Note  EVELIO RUEDA is a 78 y.o. male admitted on 10/15/2018 with syncope.  Pharmacy has been consulted for Unasyn  dosing.   ID: on 10/15/2018 with nasal bone fracture.  - CT scan:traumatic small right subarachnoid hemorrhage.  -Tm 98.4, afeb, WBC 47.3> 39.7>32.8 H/o  CLL   4/27 unasyn>>  No cultures  Plan: Unasyn 1.5g IV q 6 hrs. Dose ok Pharmacy will sign off. Please reconsult for further dosing assitance.    Height: 5\' 3"  (160 cm) Weight: 166 lb 7.2 oz (75.5 kg) IBW/kg (Calculated) : 56.9  Temp (24hrs), Avg:98.2 F (36.8 C), Min:97.6 F (36.4 C), Max:98.8 F (37.1 C)  Recent Labs  Lab 10/15/18 1806 10/15/18 1815 10/16/18 0339 10/17/18 0533  WBC 47.3*  --  39.7* 32.8*  CREATININE 1.84* 1.70* 1.70* 1.56*    Estimated Creatinine Clearance: 36.1 mL/min (A) (by C-G formula based on SCr of 1.56 mg/dL (H)).    No Known Allergies   Tashawna Thom S. Alford Highland, PharmD, [facility] Clinical Staff Pharmacist Eilene Ghazi [facility] 10/17/2018 8:24 AM

## 2018-10-17 NOTE — Progress Notes (Signed)
Pt has cardiac CTA ordered, GFR today 42. Contrast induced nephropathy prevention protocol ordered per cardiology  Marchia Bond RN Navigator Cardiac Imaging [facility] [phone] Office  [phone] Cell

## 2018-10-17 NOTE — Progress Notes (Signed)
  Coronary CTA reviewed with Dr. Aundra Dubin.   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 Stable 50-60% mLAD disease. Unlikely to be cause of syncope. Willcontinue medical management.   I will ask EP to see in am to further evaluate syncope and possible need for ILR.   HR currently in 50s so will hold bisoprolol. Change simva to crestor 20.   D/w patient and his daughter (Dr. Valla Leaver).  Glori Bickers, MD  6:08 PM

## 2018-10-17 NOTE — Progress Notes (Signed)
[facility] MD notified of Platelet level of 24 this AM. Arlis Porta

## 2018-10-18 ENCOUNTER — Encounter (HOSPITAL_COMMUNITY): Payer: Self-pay | Admitting: Cardiology

## 2018-10-18 ENCOUNTER — Encounter (HOSPITAL_COMMUNITY)
Admission: EM | Disposition: A | Discharge status: Home / Self Care | Payer: Self-pay | Source: Home / Self Care | Attending: Internal Medicine

## 2018-10-18 DIAGNOSIS — I10 Essential (primary) hypertension: Secondary | ICD-10-CM

## 2018-10-18 DIAGNOSIS — I2510 Atherosclerotic heart disease of native coronary artery without angina pectoris: Secondary | ICD-10-CM

## 2018-10-18 HISTORY — PX: LOOP RECORDER INSERTION: EP1214

## 2018-10-18 LAB — BASIC METABOLIC PANEL
Anion gap: 9 (ref 5–15)
BUN: 19 mg/dL (ref 8–23)
CO2: 23 mmol/L (ref 22–32)
Calcium: 8.4 mg/dL — ABNORMAL LOW (ref 8.9–10.3)
Chloride: 107 mmol/L (ref 98–111)
Creatinine, Ser: 1.45 mg/dL — ABNORMAL HIGH (ref 0.61–1.24)
GFR calc Af Amer: 53 mL/min — ABNORMAL LOW (ref >60)
GFR calc non Af Amer: 46 mL/min — ABNORMAL LOW (ref >60)
Glucose, Bld: 134 mg/dL — ABNORMAL HIGH (ref 70–99)
Potassium: 4.1 mmol/L (ref 3.5–5.1)
Sodium: 139 mmol/L (ref 135–145)

## 2018-10-18 LAB — CBC
HCT: 34.4 % — ABNORMAL LOW (ref 39.0–52.0)
HCT: 33.2 % — ABNORMAL LOW (ref 39.0–52.0)
Hemoglobin: 11 g/dL — ABNORMAL LOW (ref 13.0–17.0)
Hemoglobin: 10.9 g/dL — ABNORMAL LOW (ref 13.0–17.0)
MCH: 29.9 pg (ref 26.0–34.0)
MCH: 30.3 pg (ref 26.0–34.0)
MCHC: 32 g/dL (ref 30.0–36.0)
MCHC: 32.8 g/dL (ref 30.0–36.0)
MCV: 93.5 fL (ref 80.0–100.0)
MCV: 92.2 fL (ref 80.0–100.0)
Platelets: 27 10*3/uL — CL (ref 150–400)
Platelets: 30 10*3/uL — ABNORMAL LOW (ref 150–400)
RBC: 3.68 MIL/uL — ABNORMAL LOW (ref 4.22–5.81)
RBC: 3.6 MIL/uL — ABNORMAL LOW (ref 4.22–5.81)
RDW: 13.5 % (ref 11.5–15.5)
RDW: 13.5 % (ref 11.5–15.5)
WBC: 43.7 10*3/uL — ABNORMAL HIGH (ref 4.0–10.5)
WBC: 40.2 10*3/uL — ABNORMAL HIGH (ref 4.0–10.5)
nRBC: 0 % (ref 0.0–0.2)
nRBC: 0 % (ref 0.0–0.2)

## 2018-10-18 LAB — SAVE SMEAR(SSMR), FOR PROVIDER SLIDE REVIEW

## 2018-10-18 SURGERY — LOOP RECORDER INSERTION

## 2018-10-18 MED ORDER — SODIUM CHLORIDE 0.9% IV SOLUTION: Freq: Once | INTRAVENOUS | Status: DC

## 2018-10-18 MED ORDER — "THROMBI-PAD 3""X3"" EX PADS"
1.0000 | MEDICATED_PAD | Freq: Once | CUTANEOUS | Status: AC
  Administered 2018-10-18: 1 via TOPICAL
  Filled 2018-10-18: qty 1

## 2018-10-18 MED ORDER — LIDOCAINE-EPINEPHRINE 1 %-1:100000 IJ SOLN
INTRAMUSCULAR | Status: AC
  Filled 2018-10-18: qty 1

## 2018-10-18 MED ORDER — LIDOCAINE-EPINEPHRINE 1 %-1:100000 IJ SOLN
INTRAMUSCULAR | Status: DC | PRN
  Administered 2018-10-18: 20 mL via INTRADERMAL

## 2018-10-18 MED ORDER — DOUBLE ANTIBIOTIC 500-10000 UNIT/GM EX OINT: 1.0000 "application " | TOPICAL_OINTMENT | Freq: Two times a day (BID) | CUTANEOUS | 0 refills | Status: DC

## 2018-10-18 MED ORDER — PREDNISONE 20 MG PO TABS: 60.0000 mg | ORAL_TABLET | Freq: Every day | ORAL | 0 refills | Status: DC

## 2018-10-18 MED ORDER — AMOXICILLIN-POT CLAVULANATE 500-125 MG PO TABS: 1.0000 | ORAL_TABLET | Freq: Two times a day (BID) | ORAL | 0 refills | Status: DC

## 2018-10-18 MED ORDER — AMOXICILLIN-POT CLAVULANATE 500-125 MG PO TABS
1.0000 | ORAL_TABLET | Freq: Two times a day (BID) | ORAL | Status: DC
  Administered 2018-10-18 – 2018-10-19 (×2): 500 mg via ORAL
  Filled 2018-10-18 (×2): qty 1

## 2018-10-18 SURGICAL SUPPLY — 2 items
LOOP REVEAL LINQSYS (Prosthesis & Implant Heart) ×1 IMPLANT
PACK LOOP INSERTION (CUSTOM PROCEDURE TRAY) ×2 IMPLANT

## 2018-10-18 NOTE — TOC Transition Note (Signed)
Transition of Care [facility]) - CM/SW Discharge Note   Patient Details  Name: [name] MRN: [id number] Date of Birth: 11/19/1940  Transition of Care [facility]) CM/SW Contact:  Pollie Friar, RN Phone Number: 10/18/2018, 12:25 PM   Clinical Narrative:    Pt discharging home with self care. [place] services is recommended but pt is refusing. MD is aware.  DME has been delivered to his room. Family able to provide transport home.    Final next level of care: Home/Self Care Barriers to Discharge: Barriers Resolved   Patient Goals and CMS Choice Patient states their goals for this hospitalization and ongoing recovery are:: to get home and not fall again   Choice offered to / list presented to : Patient  Discharge Placement                       Discharge Plan and Services   Discharge Planning Services: CM Consult Post Acute Care Choice: Durable Medical Equipment          DME Arranged: 3-N-1, Walker rolling DME Agency: ([employer]) Date DME Agency Contacted: 10/16/18 Time DME Agency Contacted: [phone] Representative spoke with at DME Agency: [place] ([place] Interventions     Readmission Risk Interventions No flowsheet data found.

## 2018-10-18 NOTE — Progress Notes (Addendum)
HEMATOLOGY-ONCOLOGY PROGRESS NOTE  SUBJECTIVE: Patient feels fine this morning. Had implantable loop recorder placed. Feels a little light-headed after procedure. Small amount of bleeding noted during procedure, but this has stopped now. Tolerating Prednisone well. Hoping to go home later today. He has no other complaints today.  REVIEW OF SYSTEMS:   Constitutional: Denies fevers, chills.  Reports mild fatigue. Eyes: Denies blurriness of vision Ears, nose, mouth, throat, and face: Denies mucositis or sore throat Respiratory: Had shortness of breath prior to syncopal episode.  Cardiovascular: Denies chest pain. Gastrointestinal:  Denies nausea, heartburn or change in bowel habits Skin: Has multiple bruises to his face.  Lymphatics: Has stable lymphadenopathy  Neurological: Has mild dizziness today. No headaches. Behavioral/Psych: Mood is stable, no new changes  Extremities: No lower extremity edema All other systems were reviewed with the patient and are negative.  I have reviewed the past medical history, past surgical history, social history and family history with the patient and they are unchanged from previous note.   PHYSICAL EXAMINATION:  Vitals:   10/18/18 0316 10/18/18 0734  BP: (!) 126/50 132/64  Pulse: (!) 54 (!) 57  Resp: 18 17  Temp: 97.8 F (36.6 C) 98.7 F (37.1 C)  SpO2: 99%    Filed Weights   10/15/18 1731 10/16/18 0025 10/17/18 0350  Weight: 164 lb (74.4 kg) 162 lb 11.2 oz (73.8 kg) 166 lb 7.2 oz (75.5 kg)    GENERAL:alert, no distress SKIN: skin color, texture, turgor are normal, no rashes or significant lesions HENT: Swelling decreased compared to yesterday.  Bruising and swelling to both eyes. Multiple bruises to face. Small amount of dried blood to nostrils.  LUNGS: clear to auscultation and percussion with normal breathing effort HEART: regular rate & rhythm and no murmurs and no lower extremity edema ABDOMEN:abdomen soft, non-tender and normal bowel  sounds Musculoskeletal:no cyanosis of digits and no clubbing  NEURO: alert & oriented x 3 with fluent speech, no focal motor/sensory deficits  LABORATORY DATA:  I have reviewed the data as listed CMP Latest Ref Rng & Units 10/18/2018 10/17/2018 10/16/2018  Glucose 70 - 99 mg/dL 134(H) 97 94  BUN 8 - 23 mg/dL 19 23 27(H)  Creatinine 0.61 - 1.24 mg/dL 1.45(H) 1.56(H) 1.70(H)  Sodium 135 - 145 mmol/L 139 138 138  Potassium 3.5 - 5.1 mmol/L 4.1 4.2 3.8  Chloride 98 - 111 mmol/L 107 111 107  CO2 22 - 32 mmol/L 23 20(L) 20(L)  Calcium 8.9 - 10.3 mg/dL 8.4(L) 8.1(L) 8.6(L)  Total Protein 6.5 - 8.1 g/dL - - -  Total Bilirubin 0.3 - 1.2 mg/dL - - -  Alkaline Phos 38 - 126 U/L - - -  AST 15 - 41 U/L - - -  ALT 17 - 63 U/L - - -    Lab Results  Component Value Date   WBC 43.7 (H) 10/18/2018   HGB 11.0 (L) 10/18/2018   HCT 34.4 (L) 10/18/2018   MCV 93.5 10/18/2018   PLT 27 (LL) 10/18/2018   NEUTROABS 16.6 (H) 10/15/2018    ASSESSMENT AND PLAN: 1. CLL ? Review of the peripheral blood smear is consistent with chronic lymphocytic leukemia ? 12/16/2014 Peripheral blood flow cytometry consistent with CLL ? 10/16/2018 MRI brain and cervical spine - Prominent bulky cervical adenopathy within the visualized neck, compatible with history of CLL. 2.thrombocytopenia-likely secondary to chronic lymphocytic leukemia versus "pseudo thrombocytopenia"due to platelet clumping; progressive thrombocytopenia June 2018 3.Hospitalization with sepsis/UTI with bacteremia (Enterobacter aerogenes)March 2018 4. Hospitalization for  syncopal episode resulting in fall. Developed a small right subarachnoid hemorrhage.  5. MRI brain 10/16/2018 - Focal 1-2 cm osseous lesions involving the right parietal and occipital calvarium as above, indeterminate. Findings are of uncertain significance, with no definite corresponding osseous lesion seen on prior CT.  Mr. Stockman chart has been reviewed. He had a syncopal  episode resulting in a fall and small right subarachnoid hemorrhage. He has a diagnosis of CLL and has had persistent leukocytosis, thrombocytopenia, and mild anemia.  MRI of the neck shows prominent bulky cervical adenopathy within the neck compatible with history of CLL.  MRI of the brain shows 1 to 2 cm osseous lesions involving the right parietal and occipital calvarium which are indeterminate.  1.  Leukocytosis and thrombocytopenia are overall stable.  White blood cell count is up this am, but likely due to Prednisone. He has bulky adenopathy in his neck.  As discussed at his last office visit, we could consider treatment with ibrutinib and acalabrutinib.  We can have further discussions about this as an outpatient. 2.  Continue Prednisone 60 mg daily. We will see him as an outpatient next week and recheck labs and titrate Prednisone at that time.. 3.  Continue evaluation for coronary artery disease per cardiology. 4.  Subarachnoid hemorrhage is being followed by neurosurgery.  Remained stable based on the CT scan performed 10/16/2018.  Mikey Bussing, DNP, AGPCNP-BC, AOCNP  I saw Mr. Garfinkel at approximately 7 AM.  He appeared well.  No active bleeding.  He started prednisone yesterday.  I discussed the case with Dr.Bensimhon.  Aspirin has been discontinued.  I feel we should try to increase the platelet count, though there is no other clear indication for treating the CLL.  He will continue prednisone at a dose of 60 mg daily.  He will be scheduled for an office visit and CBC next week.  I discussed the case with his wife by telephone.

## 2018-10-18 NOTE — Progress Notes (Signed)
1635 Noted pt. With bleeding at loop site extending out of dressing and soaking front of gown. Dressing saturated, pressure held and dressing changed.   1730 Pt. About to be wheeled out for discharge and noted again bleeding outside of dressing and through the patient's shirt. Dressing saturated. Pressure held.   [place] on call [facility] MD for bleeding. Spoke w/ Ria Comment, NP who recommended thombi-pad and to notify Dr. Broadus John. Steri-strips replaced, 4x4 pressure dressing placed.  Paged Dr. Broadus John, orders received to cancel discharge, transfuse platelets.  Pt. With no IV access d/t discharge, difficult stick, consulted IV team.   1930 Removed dressing, noted minimal bleeding, thrombi-pad placed and taped down.
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
## 2018-10-18 NOTE — Consult Note (Signed)
Cardiology Consultation:   Patient ID: [name] MRN: [id number]; DOB: 1940-07-07  Admit date: 10/15/2018 Date of Consult: 10/18/2018  Primary Care Provider: [name], MD Primary Cardiologist: Dr. [name] (2014) Primary Electrophysiologist:  None    Patient Profile:   [name] is a 78 y.o. male with a hx of HTN, h/o NOD by cath in 2015, HTN, HLD, GERD, CLL, chronic thrombocytopenia, follows with Heme-Onc, who is being seen today for the evaluation of syncope, possible loop implant at the request of Dr. Haroldine Laws.  History of Present Illness:   Mr. Hattabaugh was admitted to [facility] 10/16/2018 after a syncopal event.  Cardiology was consulted with preceding CP and mildly abn Trop.  ER w/u included neg chest CT for PR, EKGs unremarkable with HR 50's-50's.  WBC 39.7 (chronically elevated with CLL), PLTs 29 (chronically low w/CLL). CT head noted small SAH, he has also suffered trauma to nose, orbit.  Cardiology elicited ROS/HPI of  Was in [place] in early March and walking without any problem. Over past 3 weeks has been walking in his neighborhood for 15 mins at at time and began to develop chest pressure and arm discomfort, particularly when going up hills.  Patient reports he was walking in his neighborhood yesterday evening when he started to have some intermittent chest tightness which was more severe than usual. Patient states he had been walking for about 10-12 minutes and had to stop to catch his breath 2-3 times. The pain got to the point to which he was worried if he could make it home or not. Patient then passed out and fell hitting the front of his head in the process. Patient denies any other prodromal symptoms including palpitations, lightheadedness, and dizziness. Patient is unaware how long he was unconscious for but does not think it was very long. Patient states he felt very dizzy after regaining consciousness and has some numbness/pain in his hands. Patient has noticed  progressive chest tightness and dyspnea on exertion over the last month that quickly resoles with rest. No orthopnea, PND, or edema. No recent fevers, chills, body aches, respiratory symptoms, or known exposure to COVID-19  Concerns of unstable angina, and possibly aborted SCD event, w/u included so far, TTE noted LVEF >65%, Coronary w/u with CTA was undertaken (given SAH and thrombocytopenia, trying to avoid cath and DAPT), Trop were mildly elevated though flat, not felt to have ACS, coronary anatomy noted what appears to be chronic total occlusion of distal RCA with collaterals. Stable 50-60% mLAD disease. Dr. Haroldine Laws felt unlikely to be cause of syncope with plans to continue medical management.  EP is asked to evaluate for possible loop implant.  LABS K+ 4.1 BUN/Creat 19/1.45 (trending down) WBC 43.7 (looks about his baseline)  H/H 11/34 Plts 27 (baseline appears 30's)   VSS, he is afebrile   Past Medical History:  Diagnosis Date   Arthritis    Carpal tunnel syndrome, bilateral 10/09/2017   GERD (gastroesophageal reflux disease)    Hepatitis 1966   Drug reaction after taking medication   Hyperlipemia    Hypertension    Lung nodule seen on imaging study    bilateral lungs    Past Surgical History:  Procedure Laterality Date   APPENDECTOMY     arthroscoyp  2000   left knee   COLONOSCOPY W/ POLYPECTOMY     LUMBAR LAMINECTOMY/DECOMPRESSION MICRODISCECTOMY  06/27/2012   Procedure: LUMBAR LAMINECTOMY/DECOMPRESSION MICRODISCECTOMY 1 LEVEL;  Surgeon: [name], MD;  Location: [facility];  Service: Neurosurgery;  Laterality: Bilateral;  bilateral four-five laminectony   SKIN CANCER EXCISION  5-6 years ago   moe-s surgery- basal b   TONSILLECTOMY       Home Medications:  Prior to Admission medications   Medication Sig Start Date End Date Taking? Authorizing Provider  bisoprolol-hydrochlorothiazide (ZIAC) 10-6.25 MG per tablet Take 1 tablet by mouth daily.    Yes [provider]  Cholecalciferol (VITAMIN D) 2000 units CAPS Take 2,000 Units by mouth daily.    Yes [provider]  Loratadine 10 MG CAPS Take 10 mg by mouth.   Yes [provider]  simvastatin (ZOCOR) 40 MG tablet Take 20 mg by mouth daily.    Yes [provider]  Tamsulosin HCl (FLOMAX) 0.4 MG CAPS Take 1 capsule (0.4 mg total) by mouth daily. 06/30/12  Yes Jovita Gamma, MD  EPINEPHrine (EPIPEN) 0.3 mg/0.3 mL DEVI Inject 0.3 mLs (0.3 mg total) into the muscle once. Patient not taking: Reported on 08/17/2017 05/26/12   Rise Mu, PA-C    Inpatient Medications: Scheduled Meds:  sodium chloride   Intravenous Once   cholecalciferol  2,000 Units Oral Daily   lidocaine-EPINEPHrine  10 mL Infiltration Once   polymixin-bacitracin   Topical BID   predniSONE  60 mg Oral Daily   simvastatin  20 mg Oral q1800   sodium bicarbonate   Intravenous to XRAY   sodium chloride flush  3 mL Intravenous Q12H   sodium chloride flush  3 mL Intravenous Q12H   tamsulosin  0.4 mg Oral Daily   Continuous Infusions:  sodium chloride     ampicillin-sulbactam (UNASYN) IV 1.5 g (10/18/18 0330)   PRN Meds: sodium chloride, acetaminophen **OR** acetaminophen, hydrALAZINE, loratadine, morphine injection, nitroGLYCERIN, ondansetron **OR** ondansetron (ZOFRAN) IV, oxyCODONE-acetaminophen, senna-docusate, sodium chloride flush, sodium chloride flush, zolpidem  Allergies:   No Known Allergies  Social History:   Social History   Socioeconomic History   Marital status: Married    Spouse name: [name]    Number of children: 2   Years of education: PhD   Highest education level: Not on file  Occupational History   Occupation: Retired  Scientist, product/process development strain: Not on file   Food insecurity:    Worry: Not on file    Inability: Not on Lexicographer needs:    Medical: Not on file    Non-medical: Not on file  Tobacco Use     Smoking status: Never Smoker   Smokeless tobacco: Never Used  Substance and Sexual Activity   Alcohol use: Yes    Alcohol/week: 2.0 standard drinks    Types: 2 Cans of beer per week    Comment: rare 2 beers per month   Drug use: No   Sexual activity: Not on file  Lifestyle   Physical activity:    Days per week: Not on file    Minutes per session: Not on file   Stress: Not on file  Relationships   Social connections:    Talks on phone: Not on file    Gets together: Not on file    Attends religious service: Not on file    Active member of club or organization: Not on file    Attends meetings of clubs or organizations: Not on file    Relationship status: Not on file   Intimate partner violence:    Fear of current or ex partner: Not on file    Emotionally abused: Not  on file    Physically abused: Not on file    Forced sexual activity: Not on file  Other Topics Concern   Not on file  Social History Narrative   Lives with wife   Caffeine use: 1 cup coffee per day   Diet coke      Right handed     Family History:   Family History  Problem Relation Age of Onset   Sudden death Mother 8       possible botulism   Dementia Father    Seizures Sister      ROS:  Please see the history of present illness.  All other ROS reviewed and negative.     Physical Exam/Data:   Vitals:   10/17/18 1942 10/17/18 2336 10/18/18 0316 10/18/18 0734  BP: 121/63 (!) 125/58 (!) 126/50 132/64  Pulse: (!) 57 (!) 56 (!) 54 (!) 57  Resp: 18 17 18 17   Temp: 97.8 F (36.6 C) 97.9 F (36.6 C) 97.8 F (36.6 C) 98.7 F (37.1 C)  TempSrc: Oral Oral Oral Oral  SpO2: 98% 99% 99%   Weight:      Height:        Intake/Output Summary (Last 24 hours) at 10/18/2018 0830 Last data filed at 10/17/2018 2135 Gross per 24 hour  Intake 381 ml  Output --  Net 381 ml   Last 3 Weights 10/17/2018 10/16/2018 10/15/2018  Weight (lbs) 166 lb 7.2 oz 162 lb 11.2 oz 164 lb  Weight (kg) 75.5 kg 73.8  kg 74.39 kg     Body mass index is 29.48 kg/m.  General:  Well nourished, well developed, in no acute distress HEENT: normal Lymph: no adenopathy Neck: no JVD Endocrine:  No thryomegaly Vascular: No carotid bruits; FA pulses 2+ bilaterally without bruits  Cardiac:  normal S1, S2; RRR; no murmur  Lungs:  clear to auscultation bilaterally, no wheezing, rhonchi or rales  Abd: soft, nontender, no hepatomegaly  Ext: no edema Musculoskeletal:  No deformities, BUE and BLE strength normal and equal Skin: warm and dry  Neuro:  CNs 2-12 intact, no focal abnormalities noted Psych:  Normal affect   EKG:  The EKG was personally reviewed and demonstrates:   SR/SB, 62, 58, 53 bpm normal intervals, no pre-excitation  Telemetry:  Telemetry was personally reviewed and demonstrates:   SB/SR generally 50's  Relevant CV Studies:  10/16/2018: TTE IMPRESSIONS  1. The left ventricle has hyperdynamic systolic function, with an ejection fraction of >65%. The cavity size was normal. Left ventricular diastolic Doppler parameters are consistent with pseudonormal.  2. There is a dynamic mid LV gradient of 25 mmHg.  3. The right ventricle has normal systolc function. There is no increase in right ventricular wall thickness.  4. No evidence of mitral valve stenosis.  5. No stenosis of the aortic valve.  FINDINGS  Left Ventricle: The left ventricle has hyperdynamic systolic function, with an ejection fraction of >65%. The cavity size was normal. There is no increase in left ventricular wall thickness. Left ventricular diastolic Doppler parameters are consistent  with pseudonormal (grade II). There is a dynamic mid LV gradient of 25 mmHg.    Right Ventricle: The right ventricle has normal systolic function. There is no increase in right ventricular wall thickness.  Pericardium: There is no evidence of pericardial effusion.  Mitral Valve: The mitral valve is normal in structure. Mitral valve  regurgitation is not visualized by color flow Doppler. No evidence of mitral valve stenosis.  Tricuspid  Valve: The tricuspid valve was normal in structure. Tricuspid valve regurgitation is trivial by color flow Doppler.  Aortic Valve: The aortic valve is normal in structure. Aortic valve regurgitation was not visualized by color flow Doppler. There is No stenosis of the aortic valve, with a calculated valve area of 2.13 cm.  Pulmonic Valve: The pulmonic valve was normal in structure. Pulmonic valve regurgitation is not visualized by color flow Doppler.  Laboratory Data:  Chemistry Recent Labs  Lab 10/16/18 0339 10/17/18 0533 10/18/18 0544  NA 138 138 139  K 3.8 4.2 4.1  CL 107 111 107  CO2 20* 20* 23  GLUCOSE 94 97 134*  BUN 27* 23 19  CREATININE 1.70* 1.56* 1.45*  CALCIUM 8.6* 8.1* 8.4*  GFRNONAA 38* 42* 46*  GFRAA 44* 49* 53*  ANIONGAP 11 7 9     No results for input(s): PROT, ALBUMIN, AST, ALT, ALKPHOS, BILITOT in the last 168 hours. Hematology Recent Labs  Lab 10/16/18 0339 10/17/18 0533 10/18/18 0544  WBC 39.7* 32.8* 43.7*  RBC 3.90* 3.58* 3.68*  HGB 11.8* 10.6* 11.0*  HCT 36.1* 33.7* 34.4*  MCV 92.6 94.1 93.5  MCH 30.3 29.6 29.9  MCHC 32.7 31.5 32.0  RDW 13.5 13.7 13.5  PLT 29* 24* 27*   Cardiac Enzymes Recent Labs  Lab 10/15/18 1806 10/15/18 2335 10/16/18 0339 10/16/18 0944  TROPONINI <0.03 0.07* 0.10* 0.09*   No results for input(s): TROPIPOC in the last 168 hours.  BNPNo results for input(s): BNP, PROBNP in the last 168 hours.  DDimer No results for input(s): DDIMER in the last 168 hours.  Radiology/Studies:   Ct Head Wo Contrast Result Date: 10/16/2018 CLINICAL DATA:  Follow-up subarachnoid hemorrhage. EXAM: CT HEAD WITHOUT CONTRAST TECHNIQUE: Contiguous axial images were obtained from the base of the skull through the vertex without intravenous contrast. COMPARISON:  MRI same day.  CT 10/15/2018 FINDINGS: Brain: No increase in the small amount  of post traumatic subarachnoid hemorrhage within a sulcus of the medial right frontal region. No evidence of acute brain injury otherwise. The hemorrhage is becoming less dense. No subdural hematoma. No sign of acute infarction. 13 mm meningioma at the left parietal vertex as seen previously. Vascular: There is atherosclerotic calcification of the major vessels at the base of the brain. Skull: No skull fracture. Comminuted nasal fractures as seen previously. Probable minor fracture of the lamina papyracea/medial orbital wall on the right. Sinuses/Orbits: Mucosal inflammatory changes most notable affecting the right maxillary sinus. Small amount of fluid layering in the right frontal sinus. Probable minor fracture of lamina papyracea on the right. Other: None IMPRESSION: No increase in the small amount of post traumatic subarachnoid hemorrhage on the right. The hemorrhage is becoming less distinct. No development of any other bleeding. Left parietal vertex 13 mm meningioma as seen previously. Comminuted nasal fractures as seen previously. Probable minimal fracture of the medial orbital wall on the right. Electronically Signed   By: Nelson Chimes M.D.   On: 10/16/2018 20:34      Ct Angio Chest Pe W And/or Wo Contrast Result Date: 10/15/2018 CLINICAL DATA:  78 year old male status post syncope and blunt trauma. Chest tightness, shortness of breath. CLL. EXAM: CT ANGIOGRAPHY CHEST WITH CONTRAST TECHNIQUE: Multidetector CT imaging of the chest was performed using the standard protocol during bolus administration of intravenous contrast. Multiplanar CT image reconstructions and MIPs were obtained to evaluate the vascular anatomy. CONTRAST:  41mL OMNIPAQUE IOHEXOL 350 MG/ML SOLN COMPARISON:  Head face and cervical spine  CT today reported separately. Chest radiographs 08/17/2016. Chest CT 03/16/2009. FINDINGS: Cardiovascular: Good contrast bolus timing in the pulmonary arterial tree. Mild respiratory motion. No focal  filling defect identified in the pulmonary arteries to suggest acute pulmonary embolism. Negative visible aorta aside from mild atherosclerosis. Mild cardiomegaly. No pericardial effusion. Mediastinum/Nodes: Lower cervical lymphadenopathy continuing to the thoracic inlet as seen on the face and cervical spine imaging today. There is mild to moderate mediastinal and hilar lymphadenopathy, new since 2010. Individual nodes measure up to 12 millimeter short axis. Lungs/Pleura: Major airways are patent. No pneumothorax or pleural effusion. No pulmonary contusion. Mild chronic scarring in the lingula and medial segment of the right middle lobe. There is a round 10-11 millimeter anterior basal segment left lower lobe lung nodule which was 7 millimeters in 2010. This is most likely benign. Upper Abdomen: Partially visible splenomegaly, new since 2010. Negative visible liver and bowel in the upper abdomen. Musculoskeletal: No rib or sternal fracture identified. Thoracic spine degeneration. No acute osseous abnormality identified. Review of the MIP images confirms the above findings. IMPRESSION: 1. No evidence of acute pulmonary embolus. No acute traumatic injury identified in the chest. 2. History of CLL with evidence of active disease: splenomegaly, cervical and mediastinal lymphadenopathy. 3. No acute pulmonary abnormality. A solitary lung nodule in the left lower lobe has minimally increased from 7 to 11 mm over the past 10 years compatible with benign etiology. 4. Mild cardiomegaly. Electronically Signed   By: Genevie Ann M.D.   On: 10/15/2018 20:01   Mr Brain Wo Contrast (neuro Protocol) Result Date: 10/16/2018 CLINICAL DATA:  Initial evaluation for acute syncope, bilateral hand tingling. EXAM: MRI HEAD WITHOUT CONTRAST MRI CERVICAL SPINE WITHOUT CONTRAST TECHNIQUE: Multiplanar, multiecho pulse sequences of the brain and surrounding structures, and cervical spine, to include the craniocervical junction and  cervicothoracic junction, were obtained without intravenous contrast. COMPARISON:  Prior CT from 10/15/2018 FINDINGS: MRI HEAD FINDINGS Brain: Cerebral volume within normal limits. No significant cerebral white matter changes for age. No abnormal foci of restricted diffusion to suggest acute or subacute ischemia. Gray-white matter differentiation maintained. No encephalomalacia to suggest chronic cortical infarction. Scattered small volume susceptibility artifact overlying the medial aspect of the right superior frontal gyrus compatible with small volume posttraumatic subarachnoid hemorrhage, stable from prior CT. No other new or progressive intracranial hemorrhage. Trace layering blood products noted within the occipital horns of both lateral ventricles, compatible with redistribution. 13 mm meningioma overlies the parasagittal left parietal convexity (series 11, image 20). No associated edema or mass effect. No other mass lesion. No midline shift or mass effect. No hydrocephalus. No extra-axial fluid collection. Pituitary gland suprasellar region normal. Midline structures intact and normal. Vascular: Major intracranial vascular flow voids are well maintained. Skull and upper cervical spine: Craniocervical junction normal. Bone marrow signal intensity within normal limits. Focal 17 mm T1 hypointense lesion present within the right parietal calvarium (series 9, image 6). Additional possible 2 cm lesion posteriorly within the left occipital calvarium (series 9, image 15). Findings are indeterminate. No corresponding osseous lesion seen on prior CT. Soft tissue swelling seen at the forehead, extending inferiorly to involve the nasal bridge and nose, also seen on prior maxillofacial CT. Associated maxillofacial fractures better evaluated on prior exam. Sinuses/Orbits: Globes and orbital soft tissues within normal limits. Probable right orbital fracture better seen on prior CT. Scattered mucosal thickening throughout  the ethmoidal air cells and maxillary sinuses. No significant mastoid effusion. Inner ear structures normal. Other: None. MRI  CERVICAL SPINE FINDINGS Alignment: Straightening with slight reversal of the normal cervical lordosis, apex at C5-6. Trace anterolisthesis of C4 on C5, likely chronic and facet mediated. No malalignment. Vertebrae: Vertebral body heights maintained without evidence for acute or chronic fracture. C6 and C7 vertebral bodies are largely ankylosed. Underlying bone marrow signal intensity somewhat diffusely decreased on T1 weighted imaging, suspected to be related history of CLL. No discrete or worrisome osseous lesions. Cord: Signal intensity within the cervical spinal cord is normal. No evidence for traumatic cord injury or myelomalacia. Posterior Fossa, vertebral arteries, paraspinal tissues: Craniocervical junction normal. Paraspinous and prevertebral soft tissues demonstrate no acute finding. No evidence for ligamentous injury. Prominent bilateral cervical adenopathy again noted, compatible with history of CLL. Normal intravascular flow voids seen within the vertebral arteries bilaterally. Disc levels: C2-C3: Mild bilateral uncovertebral and facet hypertrophy. No significant spinal stenosis. Mild right C3 foraminal narrowing. C3-C4: Broad-based posterior disc protrusion indents the ventral thecal sac. Mild facet and ligament flavum hypertrophy. Mild spinal stenosis without cord deformity. Superimposed uncovertebral hypertrophy with resultant moderate right worse than left C4 foraminal stenosis. C4-C5: Mild diffuse disc bulge with uncovertebral hypertrophy. Superimposed facet and ligament flavum hypertrophy. Resultant moderate spinal stenosis without significant cord deformity. Severe left with moderate right C5 foraminal narrowing. C5-C6: Diffuse circumferential disc osteophyte with intervertebral disc space narrowing. Superimposed facet and ligament flavum hypertrophy. Resultant severe  spinal stenosis with mild cord flattening. No cord signal changes. Thecal sac measures 7 mm in AP diameter. Severe bilateral C6 foraminal narrowing, right worse than left. C6-C7: C6 and C7 vertebral bodies are partially ankylosed. Associated endplate osseous ridging with uncovertebral hypertrophy. Flattening of the ventral thecal sac without significant spinal stenosis. Moderate right with mild left C7 foraminal stenosis. C7-T1: Mild disc bulge with right-sided uncovertebral hypertrophy. Mild right-sided facet degeneration. No spinal stenosis. Mild right C8 foraminal stenosis. Visualized upper thoracic spine demonstrates no significant finding. IMPRESSION: MRI HEAD IMPRESSION: 1. Small volume acute posttraumatic subarachnoid hemorrhage involving the medial right frontal lobe, stable from prior CT. No evidence for new or progressive hemorrhage. 2. No other acute intracranial abnormality. 3. Focal 1-2 cm osseous lesions involving the right parietal and occipital calvarium as above, indeterminate. Findings are of uncertain significance, with no definite corresponding osseous lesion seen on prior CT. Correlation with dedicated bone scan suggested for further evaluation. 4. Soft tissue contusion involving the forehead and central face with associated facial fractures, better seen on prior CT. 5. 13 mm meningioma overlying the left parietal convexity without associated edema. MRI CERVICAL SPINE IMPRESSION: 1. No acute traumatic injury or other finding within the cervical spine status post recent syncope and fall. No evidence for spinal cord or ligamentous injury. 2. Moderate multilevel cervical spondylolysis with resultant moderate spinal stenosis at C4-5 and more severe narrowing at C5-6. 3. Multifactorial degenerative changes with resultant multilevel foraminal narrowing as above. Notable findings include moderate bilateral C4 foraminal stenosis, severe left with moderate right C5 foraminal narrowing, severe bilateral  C6 foraminal stenosis, with moderate right C7 foraminal narrowing. 4. Prominent bulky cervical adenopathy within the visualized neck, compatible with history of CLL. Electronically Signed   By: Jeannine Boga M.D.   On: 10/16/2018 02:32    Ct Coronary Morph W/cta Cor W/score W/ca W/cm &/or Wo/cm Addendum Date: 10/17/2018   ADDENDUM REPORT: 10/17/2018 17:34 CLINICAL DATA:  Chest pain EXAM: Cardiac CTA MEDICATIONS: Sub lingual nitro. 4mg  x 2 TECHNIQUE: The patient was scanned on a Siemens 817 slice scanner. Gantry rotation speed was 250  msecs. Collimation was 0.6 mm. A 100 kV prospective scan was triggered in the ascending thoracic aorta at 35-75% of the R-R interval. Average HR during the scan was 60 bpm. The 3D data set was interpreted on a dedicated work station using MPR, MIP and VRT modes. A total of 80cc of contrast was used. FINDINGS: Non-cardiac: See separate report from [facility]. Pulmonary veins drain normally to the left atrium. Calcium Score: 63 Agatston units. Coronary Arteries: Right dominant with no anomalies LM: No plaque or stenosis. LAD system: Calcified plaque in the proximal LAD just distal to D1, suspect mild (<50%) stenosis. Circumflex system: No plaque or stenosis noted. There is a moderate OM1 that is affected by motion artifact. No calcified plaque seen and suspect probably no significant disease. RCA system: Distal RCA with long occluded segment. This may be chronic given flow in PDA and PLV, likely from collaterals. IMPRESSION: 1. Coronary artery calcium score 63 Agatston units. This places the patient in the 23rd percentile for age and gender, suggesting low risk for future cardiac events. 2. 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 The PDA and PLV fill with contrast, suggesting collaterals. 3.  Mild stenosis proximal LAD. 4. Artifact obscures a portion of OM1 but doubt significant disease. Aeisha Minarik send study for FFR. [name] Electronically Signed   By: Loralie Champagne M.D.   On: 10/17/2018 17:34  Result Date: 10/17/2018 EXAM: OVER-READ INTERPRETATION  CT CHEST The following report is an over-read performed by radiologist Dr. Abigail Miyamoto of [facility], [place] on 10/17/2018. This over-read does not include interpretation of cardiac or coronary anatomy or pathology. The coronary CTA interpretation by the cardiologist is attached. COMPARISON:  CTA of the chest of 10/15/2018. FINDINGS: Vascular: Aortic atherosclerosis. Tortuous thoracic aorta. No central pulmonary embolism, on this non-dedicated study. Mediastinum/Nodes: Mediastinal and bilateral axillary adenopathy Was detailed on recent CTA. Lungs/Pleura: Tiny bilateral pleural effusions. Mild bibasilar atelectasis. A left lower lobe pulmonary nodule measures 9 mm on image 28/12 and is consistent with a benign etiology based on presence 10 years ago. Upper Abdomen: Splenomegaly. Normal imaged portions of the liver, stomach, right adrenal gland. Musculoskeletal: Moderate thoracic spondylosis. IMPRESSION: 1.  No acute findings in the imaged extracardiac chest. 2. Chronic lymphocytic leukemia with splenomegaly and thoracic adenopathy, as on recent CTA chest. 3. Benign left lower lobe pulmonary nodule. 4. Development of trace bilateral pleural fluid and dependent bibasilar atelectasis. Electronically Signed: By: Abigail Miyamoto M.D. On: 10/17/2018 17:03      Ct Maxillofacial Wo Cm Result Date: 10/15/2018 CLINICAL DATA:  78 year old male status post syncope and blunt trauma. Nose and forehead lacerations. EXAM: CT HEAD WITHOUT CONTRAST CT MAXILLOFACIAL WITHOUT CONTRAST CT CERVICAL SPINE WITHOUT CONTRAST TECHNIQUE: Multidetector CT imaging of the head, cervical spine, and maxillofacial structures were performed using the standard protocol without intravenous contrast. Multiplanar CT image reconstructions of the cervical spine and maxillofacial structures were also generated. COMPARISON:  Paranasal sinus CT 04/06/2011.  FINDINGS: CT HEAD FINDINGS Brain: Trace subarachnoid hemorrhage along the medial aspect of the right superior frontal gyrus on series 3, image 20 and series 5, image 21. No subdural blood or other extra-axial hemorrhage identified. No intraventricular hemorrhage. No parenchymal hemorrhagic contusion identified. Partially calcified left frontoparietal convexity 11-13 millimeter meningioma suspected (series 3, image 26 and coronal image 52). No significant mass effect. No cerebral edema. Gray-white matter differentiation is within normal limits for age. No ventriculomegaly or intracranial mass effect. No cortically based acute infarct identified. Vascular: Calcified atherosclerosis  at the skull base. No suspicious intracranial vascular hyperdensity. Skull: Calvarium appears intact. See facial bone findings below. Other: Forehead scalp hematoma tracks cephalad. See facial bone findings below. Other scalp soft tissues appear negative. CT MAXILLOFACIAL FINDINGS Osseous: Comminuted and impacted bilateral anterior nasal bone fractures with associated nasal bridge soft tissue swelling and soft tissue gas. The maxillary nasal processes remain intact. However, there is mild associated deformity of the anterior superior nasal septum. There is also abnormal soft tissue gas in and around the right medial frontoethmoidal confluence, with a possible nondisplaced fracture there on series 8, image 64. Otherwise the anterior walls of the frontal sinuses appear intact. Nondisplaced fracture versus nutrient foramen of the right mandible in the parasymphyseal region best seen on series 13, image 42. Elsewhere the mandible appears intact and normally aligned, but there is superimposed poor dentition. No maxilla or zygoma fracture. Poor maxillary dentition posteriorly. Central skull base intact. Pterygoid plates intact. Orbits: Trace gas in the anterior superior right orbit suspicious for nondisplaced lamina papyracea fracture there on  series 8, image 70 and series 12, image 33. Other orbital walls are intact. Globes are intact. There is preseptal soft tissue swelling/hematoma, but no intraorbital hematoma. No other intraorbital gas. Sinuses: Trace hemorrhage layering in the right frontal sinus (series 6, image 68) associated with the nondisplaced frontoethmoidal fractures described above. Mild right anterior ethmoid bladder mucosal thickening. Minimal to mild paranasal sinus mucosal thickening elsewhere. Small volume retained secretions in the nasal cavity. Leftward anterior nasal septal deviation may be acute and associated with the nasal bone fractures described earlier. Negative nasal cavity otherwise. Tympanic cavities and mastoids are clear. Soft tissues: Negative visible noncontrast larynx, pharynx, parapharyngeal spaces, retropharyngeal space, sublingual space, submandibular and parotid glands. There is moderate bilateral level 1, level 2, and visible level 3 lymphadenopathy with enlarged individual nodes up to 17 millimeters short axis. CT CERVICAL SPINE FINDINGS Alignment: Mild reversal of cervical lordosis. Cervicothoracic junction alignment is within normal limits. Bilateral posterior element alignment is within normal limits. Skull base and vertebrae: Visualized skull base is intact. No atlanto-occipital dissociation. No acute osseous abnormality identified. Soft tissues and spinal canal: No prevertebral fluid or swelling. No visible canal hematoma. Generalized bilateral cervical lymphadenopathy continuing to the thoracic inlet (series 15, image 64). Disc levels: Congenital incomplete segmentation versus acquired ankylosis of C6-C7. Advanced cervical disc and endplate degeneration elsewhere. At least mild multifactorial spinal stenosis at C4-C5 and C5-C6. Upper chest: Negative lung apices. Grossly intact visible upper thoracic levels. IMPRESSION: 1. Positive for trace posttraumatic subarachnoid hemorrhage along the medial aspect of  the right superior frontal gyrus. No other acute traumatic injury to the brain identified. 2. Positive for generalized cervical Lymphadenopathy continuing to the thoracic inlet. On discussion with Dr. Tyrone Nine in the ED he advises the patient has chronic lymphocytic leukemia (CLL) which explains this appearance. 3. Comminuted and impacted bilateral nasal bone fractures. Probable associated fracture of the anterior superior nasal septum. Nondisplaced fracture of the anterior right frontoethmoidal confluence suspected, with mild regional soft tissue gas. Nondisplaced fracture of the right anterior lamina papyracea with trace gas in the anterior superior right orbit. No intraorbital hematoma. 4. Probable nutrient foramen of the right mandible parasymphysis, less likely a nondisplaced fracture (series 13, image 42). 5. No acute fracture in the cervical spine. 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 Study discussed by telephone with Dr. Tyrone Nine on 10/15/2018 at 19:50 . Electronically Signed   By: Lemmie Evens  [place] Crane M.D.   On: 10/15/2018 19:56    Assessment and Plan:   1. Syncope w/trauma     h/o syncope      Cardiology has initially held BB with baseline SB, though considering resuming at lower dose given CAD, would not object   plan for loop implant  Continue care otherwise with primary teams  2. CAD     CP 3. SAH 4. Facial trauma/fractures 5. AKI 6. CLL     Chronic leukocytosis, thrombocytopenia       For questions or updates, please contact [place] Please consult [email or web address] for contact info under    Signed, Baldwin [place], PA-C  10/18/2018 8:30 AM  I have seen and examined this patient with Tommye Standard.  Agree with above, note added to reflect my findings.  On exam, RRR, no murmurs, lungs clear.  Patient with an episode of syncope.  His episode of syncope occurred when he was walking.  He felt uneasy during the walk.  He did  have an episode of mild dizziness and passed out.  He had his face with multiple facial injuries and a subdural hematoma.  Coronary CT shows a chronically occluded RCA with collaterals.  At this point there is no obvious cause for his episode of syncope.  We Mael Delap thus plan for a Linq monitor insertion to monitor for arrhythmias.  Delina Kruczek M. Alfie Rideaux MD 10/18/2018 9:18 AM

## 2018-10-18 NOTE — Progress Notes (Signed)
Progress Note  Patient Name: [name] Date of Encounter: 10/18/2018  Primary Cardiologist: Previously Dr. [name] 2014  Subjective   Coronary CTA reviewed yesterday shows what appears to be chronic total occlusion of distal RCA with collaterals. Stable 50-60% mLAD disease. Unlikely to be cause of syncope. Willcontinue medical management.   Denies CP or SOB. No events on monitor. Bisoprolol 10 held due to bradycardia with HRs in low 50s   EP has seen plan ILR today    Inpatient Medications    Scheduled Meds:  [MAR Hold] sodium chloride   Intravenous Once   [MAR Hold] cholecalciferol  2,000 Units Oral Daily   [MAR Hold] lidocaine-EPINEPHrine  10 mL Infiltration Once   [MAR Hold] polymixin-bacitracin   Topical BID   [MAR Hold] predniSONE  60 mg Oral Daily   [MAR Hold] simvastatin  20 mg Oral q1800   sodium bicarbonate   Intravenous to XRAY   [MAR Hold] sodium chloride flush  3 mL Intravenous Q12H   sodium chloride flush  3 mL Intravenous Q12H   [MAR Hold] tamsulosin  0.4 mg Oral Daily   Continuous Infusions:  sodium chloride     [MAR Hold] ampicillin-sulbactam (UNASYN) IV 1.5 g (10/18/18 0330)   PRN Meds: sodium chloride, [MAR Hold] acetaminophen **OR** [MAR Hold] acetaminophen, [MAR Hold] hydrALAZINE, lidocaine-EPINEPHrine, [MAR Hold] loratadine, [MAR Hold]  morphine injection, [MAR Hold] nitroGLYCERIN, [MAR Hold] ondansetron **OR** [MAR Hold] ondansetron (ZOFRAN) IV, [MAR Hold] oxyCODONE-acetaminophen, [MAR Hold] senna-docusate, [MAR Hold] sodium chloride flush, sodium chloride flush, [MAR Hold] zolpidem   Vital Signs    Vitals:   10/17/18 1942 10/17/18 2336 10/18/18 0316 10/18/18 0734  BP: 121/63 (!) 125/58 (!) 126/50 132/64  Pulse: (!) 57 (!) 56 (!) 54 (!) 57  Resp: 18 17 18 17   Temp: 97.8 F (36.6 C) 97.9 F (36.6 C) 97.8 F (36.6 C) 98.7 F (37.1 C)  TempSrc: Oral Oral Oral Oral  SpO2: 98% 99% 99%   Weight:      Height:         Intake/Output Summary (Last 24 hours) at 10/18/2018 1007 Last data filed at 10/17/2018 2135 Gross per 24 hour  Intake 381 ml  Output --  Net 381 ml   Filed Weights   10/15/18 1731 10/16/18 0025 10/17/18 0350  Weight: 74.4 kg 73.8 kg 75.5 kg   Physical Exam   General:  Sitting in chair. No resp difficulty HEENT: diffuse facial trauma Neck: supple. no JVD. Carotids 2+ bilat; no bruits. No lymphadenopathy or thryomegaly appreciated. Cor: PMI nondisplaced. Regular brady No rubs, gallops or murmurs. Lungs: clear Abdomen: soft, nontender, nondistended. No hepatosplenomegaly. No bruits or masses. Good bowel sounds. Extremities: no cyanosis, clubbing, rash, edema Neuro: alert & orientedx3, cranial nerves grossly intact. moves all 4 extremities w/o difficulty. Affect pleasant   Labs    Chemistry Recent Labs  Lab 10/16/18 0339 10/17/18 0533 10/18/18 0544  NA 138 138 139  K 3.8 4.2 4.1  CL 107 111 107  CO2 20* 20* 23  GLUCOSE 94 97 134*  BUN 27* 23 19  CREATININE 1.70* 1.56* 1.45*  CALCIUM 8.6* 8.1* 8.4*  GFRNONAA 38* 42* 46*  GFRAA 44* 49* 53*  ANIONGAP 11 7 9      Hematology Recent Labs  Lab 10/16/18 0339 10/17/18 0533 10/18/18 0544  WBC 39.7* 32.8* 43.7*  RBC 3.90* 3.58* 3.68*  HGB 11.8* 10.6* 11.0*  HCT 36.1* 33.7* 34.4*  MCV 92.6 94.1 93.5  MCH 30.3 29.6  29.9  MCHC 32.7 31.5 32.0  RDW 13.5 13.7 13.5  PLT 29* 24* 27*    Cardiac Enzymes Recent Labs  Lab 10/15/18 1806 10/15/18 2335 10/16/18 0339 10/16/18 0944  TROPONINI <0.03 0.07* 0.10* 0.09*   No results for input(s): TROPIPOC in the last 168 hours.   BNPNo results for input(s): BNP, PROBNP in the last 168 hours.   DDimer No results for input(s): DDIMER in the last 168 hours.   Radiology    Ct Head Wo Contrast  Result Date: 10/16/2018 CLINICAL DATA:  Follow-up subarachnoid hemorrhage. EXAM: CT HEAD WITHOUT CONTRAST TECHNIQUE: Contiguous axial images were obtained from the base of the skull  through the vertex without intravenous contrast. COMPARISON:  MRI same day.  CT 10/15/2018 FINDINGS: Brain: No increase in the small amount of post traumatic subarachnoid hemorrhage within a sulcus of the medial right frontal region. No evidence of acute brain injury otherwise. The hemorrhage is becoming less dense. No subdural hematoma. No sign of acute infarction. 13 mm meningioma at the left parietal vertex as seen previously. Vascular: There is atherosclerotic calcification of the major vessels at the base of the brain. Skull: No skull fracture. Comminuted nasal fractures as seen previously. Probable minor fracture of the lamina papyracea/medial orbital wall on the right. Sinuses/Orbits: Mucosal inflammatory changes most notable affecting the right maxillary sinus. Small amount of fluid layering in the right frontal sinus. Probable minor fracture of lamina papyracea on the right. Other: None IMPRESSION: No increase in the small amount of post traumatic subarachnoid hemorrhage on the right. The hemorrhage is becoming less distinct. No development of any other bleeding. Left parietal vertex 13 mm meningioma as seen previously. Comminuted nasal fractures as seen previously. Probable minimal fracture of the medial orbital wall on the right. Electronically Signed   By: Nelson Chimes M.D.   On: 10/16/2018 20:34   Ct Coronary Morph W/cta Cor W/score W/ca W/cm &/or Wo/cm  Addendum Date: 10/17/2018   ADDENDUM REPORT: 10/17/2018 17:34 CLINICAL DATA:  Chest pain EXAM: Cardiac CTA MEDICATIONS: Sub lingual nitro. 4mg  x 2 TECHNIQUE: The patient was scanned on a Siemens 008 slice scanner. Gantry rotation speed was 250 msecs. Collimation was 0.6 mm. A 100 kV prospective scan was triggered in the ascending thoracic aorta at 35-75% of the R-R interval. Average HR during the scan was 60 bpm. The 3D data set was interpreted on a dedicated work station using MPR, MIP and VRT modes. A total of 80cc of contrast was used. FINDINGS:  Non-cardiac: See separate report from [facility]. Pulmonary veins drain normally to the left atrium. Calcium Score: 63 Agatston units. Coronary Arteries: Right dominant with no anomalies LM: No plaque or stenosis. LAD system: Calcified plaque in the proximal LAD just distal to D1, suspect mild (<50%) stenosis. Circumflex system: No plaque or stenosis noted. There is a moderate OM1 that is affected by motion artifact. No calcified plaque seen and suspect probably no significant disease. RCA system: Distal RCA with long occluded segment. This may be chronic given flow in PDA and PLV, likely from collaterals. IMPRESSION: 1. Coronary artery calcium score 63 Agatston units. This places the patient in the 23rd percentile for age and gender, suggesting low risk for future cardiac events. 2. 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 The PDA and PLV fill with contrast, suggesting collaterals. 3.  Mild stenosis proximal LAD. 4. Artifact obscures a portion of OM1 but doubt significant disease. Will send study for FFR. [place] Engineer, mining  By: Loralie Champagne M.D.   On: 10/17/2018 17:34   Result Date: 10/17/2018 EXAM: OVER-READ INTERPRETATION  CT CHEST The following report is an over-read performed by radiologist Dr. Abigail Miyamoto of [facility], [place] on 10/17/2018. This over-read does not include interpretation of cardiac or coronary anatomy or pathology. The coronary CTA interpretation by the cardiologist is attached. COMPARISON:  CTA of the chest of 10/15/2018. FINDINGS: Vascular: Aortic atherosclerosis. Tortuous thoracic aorta. No central pulmonary embolism, on this non-dedicated study. Mediastinum/Nodes: Mediastinal and bilateral axillary adenopathy Was detailed on recent CTA. Lungs/Pleura: Tiny bilateral pleural effusions. Mild bibasilar atelectasis. A left lower lobe pulmonary nodule measures 9 mm on image 28/12 and is consistent with a benign etiology based on presence 10 years ago. Upper  Abdomen: Splenomegaly. Normal imaged portions of the liver, stomach, right adrenal gland. Musculoskeletal: Moderate thoracic spondylosis. IMPRESSION: 1.  No acute findings in the imaged extracardiac chest. 2. Chronic lymphocytic leukemia with splenomegaly and thoracic adenopathy, as on recent CTA chest. 3. Benign left lower lobe pulmonary nodule. 4. Development of trace bilateral pleural fluid and dependent bibasilar atelectasis. Electronically Signed: By: Abigail Miyamoto M.D. On: 10/17/2018 17:03   Telemetry    Sinus/sinus brady 50-60 Personally reviewed   ECG    No new tracing as of 10/17/2018- Personally Reviewed  Cardiac Studies   Echocardiogram 10/16/2018:   1. The left ventricle has hyperdynamic systolic function, with an ejection fraction of >65%. The cavity size was normal. Left ventricular diastolic Doppler parameters are consistent with pseudonormal.  2. There is a dynamic mid LV gradient of 25 mmHg.  3. The right ventricle has normal systolc function. There is no increase in right ventricular wall thickness.  4. No evidence of mitral valve stenosis.  5. No stenosis of the aortic valve.  Cardiac Catheterization 09/20/2012: Impressions: 1. Focal mid LAD stenosis of moderate severity between 50 and 70%. Ostial ramus of approximately 50%. Small caliber posterior descending artery and posterior lateral artery, possible vaso-reactive. Initial angiogram of left coronary system demonstrated quite significant small caliber vessels diffusely. These seem to improve with administration of intracoronary nitroglycerin. 2. Normal left ventricular systolic function. LVEDP 14 mmHg. Ejection fraction 65%.  Recommendations:Medical management with isosorbide for possible vaso-reactivity. He cannot use Viagra with long-acting nitrates. I will also continue low-dose beta blocker, ziac. Hopefully these 2 medications will help with overall symptoms. If symptoms worsen or become more worrisome, further  evaluation with flow wire may be useful to evaluate the severity of the LAD lesion. _______________  Echocardiogram 08/19/2016: Study Conclusions: - Left ventricle: The cavity size was normal. Wall thickness was increased in a pattern of mild LVH. Systolic function was vigorous. The estimated ejection fraction was in the range of 65% to 70%. Wall motion was normal; there were no regional wall motion abnormalities. Features are consistent with a pseudonormal left ventricular filling pattern, with concomitant abnormal relaxation and increased filling pressure (grade 2 diastolic dysfunction). - Aortic valve: There was trivial regurgitation. - Pulmonary arteries: Systolic pressure was mildly increased. PA peak pressure: 44 mm Hg (S).  Impressions: - Vigorous LV systolic function; mild LVH; grade 2 diastolic dysfunction; calcified aortic valve with trace AI; mildly elevated LVOT velocity likely related to vigorous LV function; trace TR; mildly elevated pulmonary pressure.  Patient Profile     78 y.o. male with a history of moderate non-obstructive CAD on cardiac catheterization in 2015, hypertension, hyperlipidemia, GERD, CLL, and chronic thrombocytopenia, who is being seen today for the evaluation of elevated troponin  at the request of Dr. Blaine Hamper.  Assessment & Plan    1. Syncope with preceding chest pain: -EKG normal without ST-T abnormalities or QT prolongation  - Echo 10/16/2018 with LVEF of greater than 65%, no evidence of ventricular wall thickness or valvular disease - Presentation concerning for unstable angina and aborted SCD.  - However CTA reassurring.  - EP has seen plan for ILR to monitor further  2. CAD: -Patient reported progressive chest tightness and shortness of breath over the last month.  -Last cardiac catheterization in 2014 showed 50-70% stenosis of the mid LAD. -Cardiac CTA as above - No ASA with lo platelets - Change simva to crestor 20,  stop bisoprolol for now with bradycardia and low BP.  - Echo without LV dysfunction, 65% with no wall motion abnormalities or valvular disease - Stable LDL 61 and hemoglobin A1c 5.4.  3. Hypertension: -Stable, 129/54, 141/66, 124/51 most recent BP 130/57 -Stop  bisoprolol as above -HCTZ held on admission secondary to AKI  4. AKI: -Improving, creatinine today 1.45 down from 1.70 on 10/16/2018  5. Subarachnoid Hemorrhage: -Head CT showed trace post-traumatic subarachnoid hemorrhage along the medial aspect of the right superior frontal gyrus. -Management per primary team and Neurosurgery.  6. Nasal Bone Fracture:  -Head CT showed comminuted impacted bilateral nasal bone fractures. -ENT recommended following up as outpatient.  7. CLL and Thrombocytopenia: - Patient reports he has stable and asymptomatic CLL and follows up with Hem-Onc ([name]) regularly.  -Platelet 27 today with recent baseline in the 30's -Heme following  [place] for d/c from our standpoint after ILR placed. Would hold bisoprolo for now given bradycardia and low BP.    Glori Bickers, MD  10:07 AM     For questions or updates, please contact   Please consult [email or web address] for contact info under Cardiology/STEMI.

## 2018-10-18 NOTE — Discharge Summary (Signed)
Physician Discharge Summary  [name] WJX:[id number] DOB: March 19, 1941 DOA: 10/15/2018  PCP: [name], MD  Admit date: 10/15/2018 Discharge date: 10/18/2018  Time spent: 45 minutes  Recommendations for Outpatient Follow-up:  1. PCP Dr. Inda Merlin 2. Oncology Dr. Ammie Dalton in 1 week with CBC, follow-up platelet count and determine duration of prednisone 3. ENT Dr. Wilburn Cornelia in 1 week for follow-up of nasal bone fractures 4. EP Dr. Curt Bears 5/11 and wound check   Discharge Diagnoses:  Principal Problem:   Syncope Active Problems:   CLL (chronic lymphocytic leukemia) (HCC)  Chronic thrombocytopenia  CAD   Hypertension   HLD (hyperlipidemia)   Nasal bone fracture   Chest tightness   SAH (subarachnoid hemorrhage) (HCC)   Acute renal failure superimposed on stage 3 chronic kidney disease (HCC)   Thrombocytopenia (HCC)   Elevated troponin   Discharge Condition: stable  Diet recommendation: heart healthy  Filed Weights   10/15/18 1731 10/16/18 0025 10/17/18 0350  Weight: 74.4 kg 73.8 kg 75.5 kg    History of present illness:  78 year old male with history of nonobstructive CAD, hypertension, dyslipidemia, CLL, chronic thrombocytopenia presented to the emergency room with Chest tightness and shortness of breath, followed by syncope. -In the emergency room he was found to have a white count of 47,000, platelets with 30 K, creatinine of 1.7, troponin with 0.07, CT angiogram was negative for pulmonary embolism.  Hospital Course:    Syncope/unstable angina -Patient was admitted following a syncopal event while he was walking, just prior to this he experienced chest tightness and shortness of breath  -EKG was unremarkable, echocardiogram noted normal EF of 65% no valvular heart disease, no wall motion abnormality -Prior cath in 2015 noted moderate LAD disease however situation complicated by CLL/thrombocytopenia and renal insufficiency -Audiology consulted, underwent coronary CTA  yesterday which showed, unchanged unchanged 50 to 60% mid LAD disease and chronic occluded RCA with collaterals, and no further intervention was felt to be needed, medical therapy was recommended -We will continue simvastatin -Aspirin was discontinued due to severe thrombocytopenia and beta-blockers were discontinued due to persistent bradycardia -Subsequently EP was consulted, as part of the work-up for unexplained syncope, underwent loop recorder placement today -Stable plan for outpatient follow-up with EP and wound check on 5/11  Subarachnoid hemorrhage -CT and MRI noted small/trace posttraumatic subarachnoid hemorrhage -Neurosurgery was consulted recommended conservative management, repeat head CT was improved/stable -He is asymptomatic as far as this is concerned  Acute kidney injury -Creatinine was 1.8 on admission, baseline is close to 1.3  -Improved with hydration to 1.45 range today  -HCTZ discontinued  CLL/severe thrombocytopenia -Asymptomatic, followed by oncology Dr. Ammie Dalton -Has bulky lymphadenopathy and slight worsening of his chronic thrombocytopenia -Concurrent ITP suspected hence patient was started on prednisone 60 mg daily yesterday -Plan to continue current dose of prednisone at discharge and follow-up arranged with Dr. Ammie Dalton in 1 week with repeat labs  Nasal bone fractures -ENT Dr. Wilburn Cornelia was consulted by EDP -Recommended Unasyn for prophylaxis and outpatient follow-up -Treated with local wound care and pain controlled, Unasyn switched to Augmentin for 3 more days -Also has sutures placed in the ER which need to be removed in a week -Patient is instructed to follow-up with Dr. Wilburn Cornelia early next week  Discharge Exam: Vitals:   10/18/18 1201 10/18/18 1556  BP: (!) 105/59 128/66  Pulse: 60 (!) 57  Resp: 18 18  Temp: 98.4 F (36.9 C) 98.8 F (37.1 C)  SpO2:  98%    General: AAOx3  Cardiovascular: S1S2/RRR Respiratory: CTAB  Discharge  Instructions    Allergies as of 10/18/2018   No Known Allergies     Medication List    STOP taking these medications   bisoprolol-hydrochlorothiazide 10-6.25 MG tablet Commonly known as:  ZIAC     TAKE these medications   amoxicillin-clavulanate 500-125 MG tablet Commonly known as:  AUGMENTIN Take 1 tablet (500 mg total) by mouth 2 (two) times daily for 3 days.   EPINEPHrine 0.3 mg/0.3 mL Devi Commonly known as:  EpiPen Inject 0.3 mLs (0.3 mg total) into the muscle once.   Loratadine 10 MG Caps Take 10 mg by mouth.   polymixin-bacitracin 500-10000 UNIT/GM Oint ointment Apply 1 application topically 2 (two) times daily.   predniSONE 20 MG tablet Commonly known as:  DELTASONE Take 3 tablets (60 mg total) by mouth daily. Start taking on:  Oct 19, 2018   simvastatin 40 MG tablet Commonly known as:  ZOCOR Take 20 mg by mouth daily.   tamsulosin 0.4 MG Caps capsule Commonly known as:  FLOMAX Take 1 capsule (0.4 mg total) by mouth daily.   Vitamin D 50 MCG (2000 UT) Caps Take 2,000 Units by mouth daily.            Durable Medical Equipment  (From admission, onward)         Start     Ordered   10/16/18 1447  For home use only DME 3 n 1  Once     10/16/18 1446   10/16/18 1447  For home use only DME Walker rolling  Once    Question:  Patient needs a walker to treat with the following condition  Answer:  Syncope   10/16/18 1446         No Known Allergies Follow-up Information    [facility].   Specialty:  Cardiology Why:  10/29/2018 @ 2:00PM, wound check visit Contact information: [address]       Ladell Pier, MD. Go in 1 week(s).   Specialty:  Oncology Why:  with labs Contact information: [place] [address] [phone]        Jerrell Belfast, MD. Schedule an appointment as soon as possible for a visit in 1  week(s).   Specialty:  Otolaryngology Why:  for Nasal Bone fracture Contact information: [address] [place] [address] [phone]            The results of significant diagnostics from this hospitalization (including imaging, microbiology, ancillary and laboratory) are listed below for reference.    Significant Diagnostic Studies: Ct Head Wo Contrast  Result Date: 10/16/2018 CLINICAL DATA:  Follow-up subarachnoid hemorrhage. EXAM: CT HEAD WITHOUT CONTRAST TECHNIQUE: Contiguous axial images were obtained from the base of the skull through the vertex without intravenous contrast. COMPARISON:  MRI same day.  CT 10/15/2018 FINDINGS: Brain: No increase in the small amount of post traumatic subarachnoid hemorrhage within a sulcus of the medial right frontal region. No evidence of acute brain injury otherwise. The hemorrhage is becoming less dense. No subdural hematoma. No sign of acute infarction. 13 mm meningioma at the left parietal vertex as seen previously. Vascular: There is atherosclerotic calcification of the major vessels at the base of the brain. Skull: No skull fracture. Comminuted nasal fractures as seen previously. Probable minor fracture of the lamina papyracea/medial orbital wall on the right. Sinuses/Orbits: Mucosal inflammatory changes  most notable affecting the right maxillary sinus. Small amount of fluid layering in the right frontal sinus. Probable minor fracture of lamina papyracea on the right. Other: None IMPRESSION: No increase in the small amount of post traumatic subarachnoid hemorrhage on the right. The hemorrhage is becoming less distinct. No development of any other bleeding. Left parietal vertex 13 mm meningioma as seen previously. Comminuted nasal fractures as seen previously. Probable minimal fracture of the medial orbital wall on the right. Electronically Signed   By: Nelson Chimes M.D.   On: 10/16/2018 20:34   Ct Head Wo Contrast  Result Date:  10/15/2018 CLINICAL DATA:  78 year old male status post syncope and blunt trauma. Nose and forehead lacerations. EXAM: CT HEAD WITHOUT CONTRAST CT MAXILLOFACIAL WITHOUT CONTRAST CT CERVICAL SPINE WITHOUT CONTRAST TECHNIQUE: Multidetector CT imaging of the head, cervical spine, and maxillofacial structures were performed using the standard protocol without intravenous contrast. Multiplanar CT image reconstructions of the cervical spine and maxillofacial structures were also generated. COMPARISON:  Paranasal sinus CT 04/06/2011. FINDINGS: CT HEAD FINDINGS Brain: Trace subarachnoid hemorrhage along the medial aspect of the right superior frontal gyrus on series 3, image 20 and series 5, image 21. No subdural blood or other extra-axial hemorrhage identified. No intraventricular hemorrhage. No parenchymal hemorrhagic contusion identified. Partially calcified left frontoparietal convexity 11-13 millimeter meningioma suspected (series 3, image 26 and coronal image 52). No significant mass effect. No cerebral edema. Gray-white matter differentiation is within normal limits for age. No ventriculomegaly or intracranial mass effect. No cortically based acute infarct identified. Vascular: Calcified atherosclerosis at the skull base. No suspicious intracranial vascular hyperdensity. Skull: Calvarium appears intact. See facial bone findings below. Other: Forehead scalp hematoma tracks cephalad. See facial bone findings below. Other scalp soft tissues appear negative. CT MAXILLOFACIAL FINDINGS Osseous: Comminuted and impacted bilateral anterior nasal bone fractures with associated nasal bridge soft tissue swelling and soft tissue gas. The maxillary nasal processes remain intact. However, there is mild associated deformity of the anterior superior nasal septum. There is also abnormal soft tissue gas in and around the right medial frontoethmoidal confluence, with a possible nondisplaced fracture there on series 8, image 64.  Otherwise the anterior walls of the frontal sinuses appear intact. Nondisplaced fracture versus nutrient foramen of the right mandible in the parasymphyseal region best seen on series 13, image 42. Elsewhere the mandible appears intact and normally aligned, but there is superimposed poor dentition. No maxilla or zygoma fracture. Poor maxillary dentition posteriorly. Central skull base intact. Pterygoid plates intact. Orbits: Trace gas in the anterior superior right orbit suspicious for nondisplaced lamina papyracea fracture there on series 8, image 70 and series 12, image 33. Other orbital walls are intact. Globes are intact. There is preseptal soft tissue swelling/hematoma, but no intraorbital hematoma. No other intraorbital gas. Sinuses: Trace hemorrhage layering in the right frontal sinus (series 6, image 68) associated with the nondisplaced frontoethmoidal fractures described above. Mild right anterior ethmoid bladder mucosal thickening. Minimal to mild paranasal sinus mucosal thickening elsewhere. Small volume retained secretions in the nasal cavity. Leftward anterior nasal septal deviation may be acute and associated with the nasal bone fractures described earlier. Negative nasal cavity otherwise. Tympanic cavities and mastoids are clear. Soft tissues: Negative visible noncontrast larynx, pharynx, parapharyngeal spaces, retropharyngeal space, sublingual space, submandibular and parotid glands. There is moderate bilateral level 1, level 2, and visible level 3 lymphadenopathy with enlarged individual nodes up to 17 millimeters short axis. CT CERVICAL SPINE FINDINGS Alignment: Mild reversal of cervical lordosis.  Cervicothoracic junction alignment is within normal limits. Bilateral posterior element alignment is within normal limits. Skull base and vertebrae: Visualized skull base is intact. No atlanto-occipital dissociation. No acute osseous abnormality identified. Soft tissues and spinal canal: No prevertebral  fluid or swelling. No visible canal hematoma. Generalized bilateral cervical lymphadenopathy continuing to the thoracic inlet (series 15, image 64). Disc levels: Congenital incomplete segmentation versus acquired ankylosis of C6-C7. Advanced cervical disc and endplate degeneration elsewhere. At least mild multifactorial spinal stenosis at C4-C5 and C5-C6. Upper chest: Negative lung apices. Grossly intact visible upper thoracic levels. IMPRESSION: 1. Positive for trace posttraumatic subarachnoid hemorrhage along the medial aspect of the right superior frontal gyrus. No other acute traumatic injury to the brain identified. 2. Positive for generalized cervical Lymphadenopathy continuing to the thoracic inlet. On discussion with Dr. Tyrone Nine in the ED he advises the patient has chronic lymphocytic leukemia (CLL) which explains this appearance. 3. Comminuted and impacted bilateral nasal bone fractures. Probable associated fracture of the anterior superior nasal septum. Nondisplaced fracture of the anterior right frontoethmoidal confluence suspected, with mild regional soft tissue gas. Nondisplaced fracture of the right anterior lamina papyracea with trace gas in the anterior superior right orbit. No intraorbital hematoma. 4. Probable nutrient foramen of the right mandible parasymphysis, less likely a nondisplaced fracture (series 13, image 42). 5. No acute fracture in the cervical spine. 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 Study discussed by telephone with Dr. Tyrone Nine on 10/15/2018 at 19:50 . Electronically Signed   By: Genevie Ann M.D.   On: 10/15/2018 19:56   Ct Angio Chest Pe W And/or Wo Contrast  Result Date: 10/15/2018 CLINICAL DATA:  78 year old male status post syncope and blunt trauma. Chest tightness, shortness of breath. CLL. EXAM: CT ANGIOGRAPHY CHEST WITH CONTRAST TECHNIQUE: Multidetector CT imaging of the chest was performed using the standard  protocol during bolus administration of intravenous contrast. Multiplanar CT image reconstructions and MIPs were obtained to evaluate the vascular anatomy. CONTRAST:  38mL OMNIPAQUE IOHEXOL 350 MG/ML SOLN COMPARISON:  Head face and cervical spine CT today reported separately. Chest radiographs 08/17/2016. Chest CT 03/16/2009. FINDINGS: Cardiovascular: Good contrast bolus timing in the pulmonary arterial tree. Mild respiratory motion. No focal filling defect identified in the pulmonary arteries to suggest acute pulmonary embolism. Negative visible aorta aside from mild atherosclerosis. Mild cardiomegaly. No pericardial effusion. Mediastinum/Nodes: Lower cervical lymphadenopathy continuing to the thoracic inlet as seen on the face and cervical spine imaging today. There is mild to moderate mediastinal and hilar lymphadenopathy, new since 2010. Individual nodes measure up to 12 millimeter short axis. Lungs/Pleura: Major airways are patent. No pneumothorax or pleural effusion. No pulmonary contusion. Mild chronic scarring in the lingula and medial segment of the right middle lobe. There is a round 10-11 millimeter anterior basal segment left lower lobe lung nodule which was 7 millimeters in 2010. This is most likely benign. Upper Abdomen: Partially visible splenomegaly, new since 2010. Negative visible liver and bowel in the upper abdomen. Musculoskeletal: No rib or sternal fracture identified. Thoracic spine degeneration. No acute osseous abnormality identified. Review of the MIP images confirms the above findings. IMPRESSION: 1. No evidence of acute pulmonary embolus. No acute traumatic injury identified in the chest. 2. History of CLL with evidence of active disease: splenomegaly, cervical and mediastinal lymphadenopathy. 3. No acute pulmonary abnormality. A solitary lung nodule in the left lower lobe has minimally increased from 7 to 11 mm over the past 10 years  compatible with benign etiology. 4. Mild cardiomegaly.  Electronically Signed   By: Genevie Ann M.D.   On: 10/15/2018 20:01   Ct Cervical Spine Wo Contrast  Result Date: 10/15/2018 CLINICAL DATA:  78 year old male status post syncope and blunt trauma. Nose and forehead lacerations. EXAM: CT HEAD WITHOUT CONTRAST CT MAXILLOFACIAL WITHOUT CONTRAST CT CERVICAL SPINE WITHOUT CONTRAST TECHNIQUE: Multidetector CT imaging of the head, cervical spine, and maxillofacial structures were performed using the standard protocol without intravenous contrast. Multiplanar CT image reconstructions of the cervical spine and maxillofacial structures were also generated. COMPARISON:  Paranasal sinus CT 04/06/2011. FINDINGS: CT HEAD FINDINGS Brain: Trace subarachnoid hemorrhage along the medial aspect of the right superior frontal gyrus on series 3, image 20 and series 5, image 21. No subdural blood or other extra-axial hemorrhage identified. No intraventricular hemorrhage. No parenchymal hemorrhagic contusion identified. Partially calcified left frontoparietal convexity 11-13 millimeter meningioma suspected (series 3, image 26 and coronal image 52). No significant mass effect. No cerebral edema. Gray-white matter differentiation is within normal limits for age. No ventriculomegaly or intracranial mass effect. No cortically based acute infarct identified. Vascular: Calcified atherosclerosis at the skull base. No suspicious intracranial vascular hyperdensity. Skull: Calvarium appears intact. See facial bone findings below. Other: Forehead scalp hematoma tracks cephalad. See facial bone findings below. Other scalp soft tissues appear negative. CT MAXILLOFACIAL FINDINGS Osseous: Comminuted and impacted bilateral anterior nasal bone fractures with associated nasal bridge soft tissue swelling and soft tissue gas. The maxillary nasal processes remain intact. However, there is mild associated deformity of the anterior superior nasal septum. There is also abnormal soft tissue gas in and around the  right medial frontoethmoidal confluence, with a possible nondisplaced fracture there on series 8, image 64. Otherwise the anterior walls of the frontal sinuses appear intact. Nondisplaced fracture versus nutrient foramen of the right mandible in the parasymphyseal region best seen on series 13, image 42. Elsewhere the mandible appears intact and normally aligned, but there is superimposed poor dentition. No maxilla or zygoma fracture. Poor maxillary dentition posteriorly. Central skull base intact. Pterygoid plates intact. Orbits: Trace gas in the anterior superior right orbit suspicious for nondisplaced lamina papyracea fracture there on series 8, image 70 and series 12, image 33. Other orbital walls are intact. Globes are intact. There is preseptal soft tissue swelling/hematoma, but no intraorbital hematoma. No other intraorbital gas. Sinuses: Trace hemorrhage layering in the right frontal sinus (series 6, image 68) associated with the nondisplaced frontoethmoidal fractures described above. Mild right anterior ethmoid bladder mucosal thickening. Minimal to mild paranasal sinus mucosal thickening elsewhere. Small volume retained secretions in the nasal cavity. Leftward anterior nasal septal deviation may be acute and associated with the nasal bone fractures described earlier. Negative nasal cavity otherwise. Tympanic cavities and mastoids are clear. Soft tissues: Negative visible noncontrast larynx, pharynx, parapharyngeal spaces, retropharyngeal space, sublingual space, submandibular and parotid glands. There is moderate bilateral level 1, level 2, and visible level 3 lymphadenopathy with enlarged individual nodes up to 17 millimeters short axis. CT CERVICAL SPINE FINDINGS Alignment: Mild reversal of cervical lordosis. Cervicothoracic junction alignment is within normal limits. Bilateral posterior element alignment is within normal limits. Skull base and vertebrae: Visualized skull base is intact. No  atlanto-occipital dissociation. No acute osseous abnormality identified. Soft tissues and spinal canal: No prevertebral fluid or swelling. No visible canal hematoma. Generalized bilateral cervical lymphadenopathy continuing to the thoracic inlet (series 15, image 64). Disc levels: Congenital incomplete segmentation versus acquired ankylosis of C6-C7. Advanced cervical disc  and endplate degeneration elsewhere. At least mild multifactorial spinal stenosis at C4-C5 and C5-C6. Upper chest: Negative lung apices. Grossly intact visible upper thoracic levels. IMPRESSION: 1. Positive for trace posttraumatic subarachnoid hemorrhage along the medial aspect of the right superior frontal gyrus. No other acute traumatic injury to the brain identified. 2. Positive for generalized cervical Lymphadenopathy continuing to the thoracic inlet. On discussion with Dr. Tyrone Nine in the ED he advises the patient has chronic lymphocytic leukemia (CLL) which explains this appearance. 3. Comminuted and impacted bilateral nasal bone fractures. Probable associated fracture of the anterior superior nasal septum. Nondisplaced fracture of the anterior right frontoethmoidal confluence suspected, with mild regional soft tissue gas. Nondisplaced fracture of the right anterior lamina papyracea with trace gas in the anterior superior right orbit. No intraorbital hematoma. 4. Probable nutrient foramen of the right mandible parasymphysis, less likely a nondisplaced fracture (series 13, image 42). 5. No acute fracture in the cervical spine. 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 Study discussed by telephone with Dr. Tyrone Nine on 10/15/2018 at 19:50 . Electronically Signed   By: Genevie Ann M.D.   On: 10/15/2018 19:56   Mr Brain Wo Contrast (neuro Protocol)  Result Date: 10/16/2018 CLINICAL DATA:  Initial evaluation for acute syncope, bilateral hand tingling. EXAM: MRI HEAD WITHOUT CONTRAST MRI  CERVICAL SPINE WITHOUT CONTRAST TECHNIQUE: Multiplanar, multiecho pulse sequences of the brain and surrounding structures, and cervical spine, to include the craniocervical junction and cervicothoracic junction, were obtained without intravenous contrast. COMPARISON:  Prior CT from 10/15/2018 FINDINGS: MRI HEAD FINDINGS Brain: Cerebral volume within normal limits. No significant cerebral white matter changes for age. No abnormal foci of restricted diffusion to suggest acute or subacute ischemia. Gray-white matter differentiation maintained. No encephalomalacia to suggest chronic cortical infarction. Scattered small volume susceptibility artifact overlying the medial aspect of the right superior frontal gyrus compatible with small volume posttraumatic subarachnoid hemorrhage, stable from prior CT. No other new or progressive intracranial hemorrhage. Trace layering blood products noted within the occipital horns of both lateral ventricles, compatible with redistribution. 13 mm meningioma overlies the parasagittal left parietal convexity (series 11, image 20). No associated edema or mass effect. No other mass lesion. No midline shift or mass effect. No hydrocephalus. No extra-axial fluid collection. Pituitary gland suprasellar region normal. Midline structures intact and normal. Vascular: Major intracranial vascular flow voids are well maintained. Skull and upper cervical spine: Craniocervical junction normal. Bone marrow signal intensity within normal limits. Focal 17 mm T1 hypointense lesion present within the right parietal calvarium (series 9, image 6). Additional possible 2 cm lesion posteriorly within the left occipital calvarium (series 9, image 15). Findings are indeterminate. No corresponding osseous lesion seen on prior CT. Soft tissue swelling seen at the forehead, extending inferiorly to involve the nasal bridge and nose, also seen on prior maxillofacial CT. Associated maxillofacial fractures better  evaluated on prior exam. Sinuses/Orbits: Globes and orbital soft tissues within normal limits. Probable right orbital fracture better seen on prior CT. Scattered mucosal thickening throughout the ethmoidal air cells and maxillary sinuses. No significant mastoid effusion. Inner ear structures normal. Other: None. MRI CERVICAL SPINE FINDINGS Alignment: Straightening with slight reversal of the normal cervical lordosis, apex at C5-6. Trace anterolisthesis of C4 on C5, likely chronic and facet mediated. No malalignment. Vertebrae: Vertebral body heights maintained without evidence for acute or chronic fracture. C6 and C7 vertebral bodies are largely ankylosed. Underlying bone marrow signal intensity somewhat diffusely decreased on T1  weighted imaging, suspected to be related history of CLL. No discrete or worrisome osseous lesions. Cord: Signal intensity within the cervical spinal cord is normal. No evidence for traumatic cord injury or myelomalacia. Posterior Fossa, vertebral arteries, paraspinal tissues: Craniocervical junction normal. Paraspinous and prevertebral soft tissues demonstrate no acute finding. No evidence for ligamentous injury. Prominent bilateral cervical adenopathy again noted, compatible with history of CLL. Normal intravascular flow voids seen within the vertebral arteries bilaterally. Disc levels: C2-C3: Mild bilateral uncovertebral and facet hypertrophy. No significant spinal stenosis. Mild right C3 foraminal narrowing. C3-C4: Broad-based posterior disc protrusion indents the ventral thecal sac. Mild facet and ligament flavum hypertrophy. Mild spinal stenosis without cord deformity. Superimposed uncovertebral hypertrophy with resultant moderate right worse than left C4 foraminal stenosis. C4-C5: Mild diffuse disc bulge with uncovertebral hypertrophy. Superimposed facet and ligament flavum hypertrophy. Resultant moderate spinal stenosis without significant cord deformity. Severe left with moderate  right C5 foraminal narrowing. C5-C6: Diffuse circumferential disc osteophyte with intervertebral disc space narrowing. Superimposed facet and ligament flavum hypertrophy. Resultant severe spinal stenosis with mild cord flattening. No cord signal changes. Thecal sac measures 7 mm in AP diameter. Severe bilateral C6 foraminal narrowing, right worse than left. C6-C7: C6 and C7 vertebral bodies are partially ankylosed. Associated endplate osseous ridging with uncovertebral hypertrophy. Flattening of the ventral thecal sac without significant spinal stenosis. Moderate right with mild left C7 foraminal stenosis. C7-T1: Mild disc bulge with right-sided uncovertebral hypertrophy. Mild right-sided facet degeneration. No spinal stenosis. Mild right C8 foraminal stenosis. Visualized upper thoracic spine demonstrates no significant finding. IMPRESSION: MRI HEAD IMPRESSION: 1. Small volume acute posttraumatic subarachnoid hemorrhage involving the medial right frontal lobe, stable from prior CT. No evidence for new or progressive hemorrhage. 2. No other acute intracranial abnormality. 3. Focal 1-2 cm osseous lesions involving the right parietal and occipital calvarium as above, indeterminate. Findings are of uncertain significance, with no definite corresponding osseous lesion seen on prior CT. Correlation with dedicated bone scan suggested for further evaluation. 4. Soft tissue contusion involving the forehead and central face with associated facial fractures, better seen on prior CT. 5. 13 mm meningioma overlying the left parietal convexity without associated edema. MRI CERVICAL SPINE IMPRESSION: 1. No acute traumatic injury or other finding within the cervical spine status post recent syncope and fall. No evidence for spinal cord or ligamentous injury. 2. Moderate multilevel cervical spondylolysis with resultant moderate spinal stenosis at C4-5 and more severe narrowing at C5-6. 3. Multifactorial degenerative changes with  resultant multilevel foraminal narrowing as above. Notable findings include moderate bilateral C4 foraminal stenosis, severe left with moderate right C5 foraminal narrowing, severe bilateral C6 foraminal stenosis, with moderate right C7 foraminal narrowing. 4. Prominent bulky cervical adenopathy within the visualized neck, compatible with history of CLL. Electronically Signed   By: Jeannine Boga M.D.   On: 10/16/2018 02:32   Mr Cervical Spine Wo Contrast  Result Date: 10/16/2018 CLINICAL DATA:  Initial evaluation for acute syncope, bilateral hand tingling. EXAM: MRI HEAD WITHOUT CONTRAST MRI CERVICAL SPINE WITHOUT CONTRAST TECHNIQUE: Multiplanar, multiecho pulse sequences of the brain and surrounding structures, and cervical spine, to include the craniocervical junction and cervicothoracic junction, were obtained without intravenous contrast. COMPARISON:  Prior CT from 10/15/2018 FINDINGS: MRI HEAD FINDINGS Brain: Cerebral volume within normal limits. No significant cerebral white matter changes for age. No abnormal foci of restricted diffusion to suggest acute or subacute ischemia. Gray-white matter differentiation maintained. No encephalomalacia to suggest chronic cortical infarction. Scattered small volume susceptibility artifact overlying  the medial aspect of the right superior frontal gyrus compatible with small volume posttraumatic subarachnoid hemorrhage, stable from prior CT. No other new or progressive intracranial hemorrhage. Trace layering blood products noted within the occipital horns of both lateral ventricles, compatible with redistribution. 13 mm meningioma overlies the parasagittal left parietal convexity (series 11, image 20). No associated edema or mass effect. No other mass lesion. No midline shift or mass effect. No hydrocephalus. No extra-axial fluid collection. Pituitary gland suprasellar region normal. Midline structures intact and normal. Vascular: Major intracranial vascular flow  voids are well maintained. Skull and upper cervical spine: Craniocervical junction normal. Bone marrow signal intensity within normal limits. Focal 17 mm T1 hypointense lesion present within the right parietal calvarium (series 9, image 6). Additional possible 2 cm lesion posteriorly within the left occipital calvarium (series 9, image 15). Findings are indeterminate. No corresponding osseous lesion seen on prior CT. Soft tissue swelling seen at the forehead, extending inferiorly to involve the nasal bridge and nose, also seen on prior maxillofacial CT. Associated maxillofacial fractures better evaluated on prior exam. Sinuses/Orbits: Globes and orbital soft tissues within normal limits. Probable right orbital fracture better seen on prior CT. Scattered mucosal thickening throughout the ethmoidal air cells and maxillary sinuses. No significant mastoid effusion. Inner ear structures normal. Other: None. MRI CERVICAL SPINE FINDINGS Alignment: Straightening with slight reversal of the normal cervical lordosis, apex at C5-6. Trace anterolisthesis of C4 on C5, likely chronic and facet mediated. No malalignment. Vertebrae: Vertebral body heights maintained without evidence for acute or chronic fracture. C6 and C7 vertebral bodies are largely ankylosed. Underlying bone marrow signal intensity somewhat diffusely decreased on T1 weighted imaging, suspected to be related history of CLL. No discrete or worrisome osseous lesions. Cord: Signal intensity within the cervical spinal cord is normal. No evidence for traumatic cord injury or myelomalacia. Posterior Fossa, vertebral arteries, paraspinal tissues: Craniocervical junction normal. Paraspinous and prevertebral soft tissues demonstrate no acute finding. No evidence for ligamentous injury. Prominent bilateral cervical adenopathy again noted, compatible with history of CLL. Normal intravascular flow voids seen within the vertebral arteries bilaterally. Disc levels: C2-C3: Mild  bilateral uncovertebral and facet hypertrophy. No significant spinal stenosis. Mild right C3 foraminal narrowing. C3-C4: Broad-based posterior disc protrusion indents the ventral thecal sac. Mild facet and ligament flavum hypertrophy. Mild spinal stenosis without cord deformity. Superimposed uncovertebral hypertrophy with resultant moderate right worse than left C4 foraminal stenosis. C4-C5: Mild diffuse disc bulge with uncovertebral hypertrophy. Superimposed facet and ligament flavum hypertrophy. Resultant moderate spinal stenosis without significant cord deformity. Severe left with moderate right C5 foraminal narrowing. C5-C6: Diffuse circumferential disc osteophyte with intervertebral disc space narrowing. Superimposed facet and ligament flavum hypertrophy. Resultant severe spinal stenosis with mild cord flattening. No cord signal changes. Thecal sac measures 7 mm in AP diameter. Severe bilateral C6 foraminal narrowing, right worse than left. C6-C7: C6 and C7 vertebral bodies are partially ankylosed. Associated endplate osseous ridging with uncovertebral hypertrophy. Flattening of the ventral thecal sac without significant spinal stenosis. Moderate right with mild left C7 foraminal stenosis. C7-T1: Mild disc bulge with right-sided uncovertebral hypertrophy. Mild right-sided facet degeneration. No spinal stenosis. Mild right C8 foraminal stenosis. Visualized upper thoracic spine demonstrates no significant finding. IMPRESSION: MRI HEAD IMPRESSION: 1. Small volume acute posttraumatic subarachnoid hemorrhage involving the medial right frontal lobe, stable from prior CT. No evidence for new or progressive hemorrhage. 2. No other acute intracranial abnormality. 3. Focal 1-2 cm osseous lesions involving the right parietal and occipital calvarium as  above, indeterminate. Findings are of uncertain significance, with no definite corresponding osseous lesion seen on prior CT. Correlation with dedicated bone scan suggested  for further evaluation. 4. Soft tissue contusion involving the forehead and central face with associated facial fractures, better seen on prior CT. 5. 13 mm meningioma overlying the left parietal convexity without associated edema. MRI CERVICAL SPINE IMPRESSION: 1. No acute traumatic injury or other finding within the cervical spine status post recent syncope and fall. No evidence for spinal cord or ligamentous injury. 2. Moderate multilevel cervical spondylolysis with resultant moderate spinal stenosis at C4-5 and more severe narrowing at C5-6. 3. Multifactorial degenerative changes with resultant multilevel foraminal narrowing as above. Notable findings include moderate bilateral C4 foraminal stenosis, severe left with moderate right C5 foraminal narrowing, severe bilateral C6 foraminal stenosis, with moderate right C7 foraminal narrowing. 4. Prominent bulky cervical adenopathy within the visualized neck, compatible with history of CLL. Electronically Signed   By: Jeannine Boga M.D.   On: 10/16/2018 02:32   Ct Coronary Morph W/cta Cor W/score W/ca W/cm &/or Wo/cm  Addendum Date: 10/17/2018   ADDENDUM REPORT: 10/17/2018 17:34 CLINICAL DATA:  Chest pain EXAM: Cardiac CTA MEDICATIONS: Sub lingual nitro. 4mg  x 2 TECHNIQUE: The patient was scanned on a Siemens 147 slice scanner. Gantry rotation speed was 250 msecs. Collimation was 0.6 mm. A 100 kV prospective scan was triggered in the ascending thoracic aorta at 35-75% of the R-R interval. Average HR during the scan was 60 bpm. The 3D data set was interpreted on a dedicated work station using MPR, MIP and VRT modes. A total of 80cc of contrast was used. FINDINGS: Non-cardiac: See separate report from [facility]. Pulmonary veins drain normally to the left atrium. Calcium Score: 63 Agatston units. Coronary Arteries: Right dominant with no anomalies LM: No plaque or stenosis. LAD system: Calcified plaque in the proximal LAD just distal to D1, suspect  mild (<50%) stenosis. Circumflex system: No plaque or stenosis noted. There is a moderate OM1 that is affected by motion artifact. No calcified plaque seen and suspect probably no significant disease. RCA system: Distal RCA with long occluded segment. This may be chronic given flow in PDA and PLV, likely from collaterals. IMPRESSION: 1. Coronary artery calcium score 63 Agatston units. This places the patient in the 23rd percentile for age and gender, suggesting low risk for future cardiac events. 2. 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 The PDA and PLV fill with contrast, suggesting collaterals. 3.  Mild stenosis proximal LAD. 4. Artifact obscures a portion of OM1 but doubt significant disease. Will send study for FFR. [name] Electronically Signed   By: Loralie Champagne M.D.   On: 10/17/2018 17:34   Result Date: 10/17/2018 EXAM: OVER-READ INTERPRETATION  CT CHEST The following report is an over-read performed by radiologist Dr. Abigail Miyamoto of [facility], [place] on 10/17/2018. This over-read does not include interpretation of cardiac or coronary anatomy or pathology. The coronary CTA interpretation by the cardiologist is attached. COMPARISON:  CTA of the chest of 10/15/2018. FINDINGS: Vascular: Aortic atherosclerosis. Tortuous thoracic aorta. No central pulmonary embolism, on this non-dedicated study. Mediastinum/Nodes: Mediastinal and bilateral axillary adenopathy Was detailed on recent CTA. Lungs/Pleura: Tiny bilateral pleural effusions. Mild bibasilar atelectasis. A left lower lobe pulmonary nodule measures 9 mm on image 28/12 and is consistent with a benign etiology based on presence 10 years ago. Upper Abdomen: Splenomegaly. Normal imaged portions of the liver, stomach, right adrenal gland. Musculoskeletal: Moderate thoracic spondylosis. IMPRESSION: 1.  No acute findings in the imaged extracardiac chest. 2. Chronic lymphocytic leukemia with splenomegaly and thoracic adenopathy, as on recent CTA  chest. 3. Benign left lower lobe pulmonary nodule. 4. Development of trace bilateral pleural fluid and dependent bibasilar atelectasis. Electronically Signed: By: Abigail Miyamoto M.D. On: 10/17/2018 17:03   Ct Maxillofacial Wo Cm  Result Date: 10/15/2018 CLINICAL DATA:  78 year old male status post syncope and blunt trauma. Nose and forehead lacerations. EXAM: CT HEAD WITHOUT CONTRAST CT MAXILLOFACIAL WITHOUT CONTRAST CT CERVICAL SPINE WITHOUT CONTRAST TECHNIQUE: Multidetector CT imaging of the head, cervical spine, and maxillofacial structures were performed using the standard protocol without intravenous contrast. Multiplanar CT image reconstructions of the cervical spine and maxillofacial structures were also generated. COMPARISON:  Paranasal sinus CT 04/06/2011. FINDINGS: CT HEAD FINDINGS Brain: Trace subarachnoid hemorrhage along the medial aspect of the right superior frontal gyrus on series 3, image 20 and series 5, image 21. No subdural blood or other extra-axial hemorrhage identified. No intraventricular hemorrhage. No parenchymal hemorrhagic contusion identified. Partially calcified left frontoparietal convexity 11-13 millimeter meningioma suspected (series 3, image 26 and coronal image 52). No significant mass effect. No cerebral edema. Gray-white matter differentiation is within normal limits for age. No ventriculomegaly or intracranial mass effect. No cortically based acute infarct identified. Vascular: Calcified atherosclerosis at the skull base. No suspicious intracranial vascular hyperdensity. Skull: Calvarium appears intact. See facial bone findings below. Other: Forehead scalp hematoma tracks cephalad. See facial bone findings below. Other scalp soft tissues appear negative. CT MAXILLOFACIAL FINDINGS Osseous: Comminuted and impacted bilateral anterior nasal bone fractures with associated nasal bridge soft tissue swelling and soft tissue gas. The maxillary nasal processes remain intact. However,  there is mild associated deformity of the anterior superior nasal septum. There is also abnormal soft tissue gas in and around the right medial frontoethmoidal confluence, with a possible nondisplaced fracture there on series 8, image 64. Otherwise the anterior walls of the frontal sinuses appear intact. Nondisplaced fracture versus nutrient foramen of the right mandible in the parasymphyseal region best seen on series 13, image 42. Elsewhere the mandible appears intact and normally aligned, but there is superimposed poor dentition. No maxilla or zygoma fracture. Poor maxillary dentition posteriorly. Central skull base intact. Pterygoid plates intact. Orbits: Trace gas in the anterior superior right orbit suspicious for nondisplaced lamina papyracea fracture there on series 8, image 70 and series 12, image 33. Other orbital walls are intact. Globes are intact. There is preseptal soft tissue swelling/hematoma, but no intraorbital hematoma. No other intraorbital gas. Sinuses: Trace hemorrhage layering in the right frontal sinus (series 6, image 68) associated with the nondisplaced frontoethmoidal fractures described above. Mild right anterior ethmoid bladder mucosal thickening. Minimal to mild paranasal sinus mucosal thickening elsewhere. Small volume retained secretions in the nasal cavity. Leftward anterior nasal septal deviation may be acute and associated with the nasal bone fractures described earlier. Negative nasal cavity otherwise. Tympanic cavities and mastoids are clear. Soft tissues: Negative visible noncontrast larynx, pharynx, parapharyngeal spaces, retropharyngeal space, sublingual space, submandibular and parotid glands. There is moderate bilateral level 1, level 2, and visible level 3 lymphadenopathy with enlarged individual nodes up to 17 millimeters short axis. CT CERVICAL SPINE FINDINGS Alignment: Mild reversal of cervical lordosis. Cervicothoracic junction alignment is within normal limits.  Bilateral posterior element alignment is within normal limits. Skull base and vertebrae: Visualized skull base is intact. No atlanto-occipital dissociation. No acute osseous abnormality identified. Soft tissues and spinal canal: No prevertebral fluid or swelling. No visible  canal hematoma. Generalized bilateral cervical lymphadenopathy continuing to the thoracic inlet (series 15, image 64). Disc levels: Congenital incomplete segmentation versus acquired ankylosis of C6-C7. Advanced cervical disc and endplate degeneration elsewhere. At least mild multifactorial spinal stenosis at C4-C5 and C5-C6. Upper chest: Negative lung apices. Grossly intact visible upper thoracic levels. IMPRESSION: 1. Positive for trace posttraumatic subarachnoid hemorrhage along the medial aspect of the right superior frontal gyrus. No other acute traumatic injury to the brain identified. 2. Positive for generalized cervical Lymphadenopathy continuing to the thoracic inlet. On discussion with Dr. Tyrone Nine in the ED he advises the patient has chronic lymphocytic leukemia (CLL) which explains this appearance. 3. Comminuted and impacted bilateral nasal bone fractures. Probable associated fracture of the anterior superior nasal septum. Nondisplaced fracture of the anterior right frontoethmoidal confluence suspected, with mild regional soft tissue gas. Nondisplaced fracture of the right anterior lamina papyracea with trace gas in the anterior superior right orbit. No intraorbital hematoma. 4. Probable nutrient foramen of the right mandible parasymphysis, less likely a nondisplaced fracture (series 13, image 42). 5. No acute fracture in the cervical spine. 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 Study discussed by telephone with Dr. Tyrone Nine on 10/15/2018 at 19:50 . Electronically Signed   By: Genevie Ann M.D.   On: 10/15/2018 19:56    Microbiology: No results found for this or any previous  visit (from the past 240 hour(s)).   Labs: Basic Metabolic Panel: Recent Labs  Lab 10/15/18 1806 10/15/18 1815 10/16/18 0339 10/17/18 0533 10/18/18 0544  NA 139  --  138 138 139  K 3.5  --  3.8 4.2 4.1  CL 104  --  107 111 107  CO2 21*  --  20* 20* 23  GLUCOSE 101*  --  94 97 134*  BUN 28*  --  27* 23 19  CREATININE 1.84* 1.70* 1.70* 1.56* 1.45*  CALCIUM 9.2  --  8.6* 8.1* 8.4*   Liver Function Tests: No results for input(s): AST, ALT, ALKPHOS, BILITOT, PROT, ALBUMIN in the last 168 hours. No results for input(s): LIPASE, AMYLASE in the last 168 hours. No results for input(s): AMMONIA in the last 168 hours. CBC: Recent Labs  Lab 10/15/18 1806 10/16/18 0339 10/17/18 0533 10/18/18 0544  WBC 47.3* 39.7* 32.8* 43.7*  NEUTROABS 16.6*  --   --   --   HGB 12.5* 11.8* 10.6* 11.0*  HCT 39.1 36.1* 33.7* 34.4*  MCV 93.3 92.6 94.1 93.5  PLT 36* 29* 24* 27*   Cardiac Enzymes: Recent Labs  Lab 10/15/18 1806 10/15/18 2335 10/16/18 0339 10/16/18 0944  TROPONINI <0.03 0.07* 0.10* 0.09*   BNP: BNP (last 3 results) No results for input(s): BNP in the last 8760 hours.  ProBNP (last 3 results) No results for input(s): PROBNP in the last 8760 hours.  CBG: No results for input(s): GLUCAP in the last 168 hours.     Signed:  Domenic Polite MD.  [facility] 10/18/2018, 4:06 PM

## 2018-10-18 NOTE — Discharge Instructions (Signed)
Implant site/wound care instructions °Keep incision clean and dry for 3 days. °You can remove outer dressing tomorrow. °Leave steri-strips (little pieces of tape) on until seen in the office for wound check appointment. °Call the office ([phone]) for redness, drainage, swelling, or fever. ° °

## 2018-10-19 ENCOUNTER — Other Ambulatory Visit: Payer: Self-pay | Admitting: *Deleted

## 2018-10-19 ENCOUNTER — Telehealth: Payer: Self-pay | Admitting: Oncology

## 2018-10-19 DIAGNOSIS — C9110 Chronic lymphocytic leukemia of B-cell type not having achieved remission: Secondary | ICD-10-CM

## 2018-10-19 LAB — PREPARE PLATELET PHERESIS: Unit division: 0

## 2018-10-19 LAB — BPAM PLATELET PHERESIS
Blood Product Expiration Date: 202005022359
ISSUE DATE / TIME: 202004302257
Unit Type and Rh: 5100

## 2018-10-19 LAB — GLUCOSE, CAPILLARY
Glucose-Capillary: 101 mg/dL — ABNORMAL HIGH (ref 70–99)
Glucose-Capillary: 88 mg/dL (ref 70–99)
Glucose-Capillary: 90 mg/dL (ref 70–99)
Glucose-Capillary: 79 mg/dL (ref 70–99)

## 2018-10-19 LAB — PATHOLOGIST SMEAR REVIEW

## 2018-10-19 MED ORDER — PREDNISONE 20 MG PO TABS: 60.0000 mg | ORAL_TABLET | Freq: Every day | ORAL | 0 refills | Status: DC

## 2018-10-19 MED ORDER — AMOXICILLIN-POT CLAVULANATE 500-125 MG PO TABS: 1.0000 | ORAL_TABLET | Freq: Two times a day (BID) | ORAL | 0 refills | Status: DC

## 2018-10-19 MED FILL — predniSONE 20 MG TABS: 20 | 20 days supply | Qty: 60 | Fill #0

## 2018-10-19 MED FILL — AMOX-CLAV 500-125 MG TABLET: 500-125 | 3 days supply | Qty: 6 | Fill #0

## 2018-10-19 NOTE — Progress Notes (Signed)
Patient seen and examined. Patient's dressing removed. Steri strips in place. No further bleeding noted. OK for discharge today with follow up pending LINQ results.   Annayah Worthley Curt Bears, MD 10/19/2018 9:12 AM

## 2018-10-19 NOTE — Telephone Encounter (Signed)
Scheduled appt per 5/1 sch message - pt wife is aware of appt date and time

## 2018-10-20 LAB — CBC
HCT: 30.8 % — ABNORMAL LOW (ref 39.0–52.0)
Hemoglobin: 10 g/dL — ABNORMAL LOW (ref 13.0–17.0)
MCH: 30.1 pg (ref 26.0–34.0)
MCHC: 32.5 g/dL (ref 30.0–36.0)
MCV: 92.8 fL (ref 80.0–100.0)
Platelets: 29 10*3/uL — CL (ref 150–400)
RBC: 3.32 MIL/uL — ABNORMAL LOW (ref 4.22–5.81)
RDW: 13.6 % (ref 11.5–15.5)
WBC: 34.1 10*3/uL — ABNORMAL HIGH (ref 4.0–10.5)
nRBC: 0.1 % (ref 0.0–0.2)

## 2018-10-22 ENCOUNTER — Other Ambulatory Visit: Payer: Self-pay

## 2018-10-22 ENCOUNTER — Telehealth: Payer: Self-pay | Admitting: Oncology

## 2018-10-22 ENCOUNTER — Other Ambulatory Visit: Payer: Self-pay | Admitting: Oncology

## 2018-10-22 ENCOUNTER — Inpatient Hospital Stay: Payer: Medicare Other

## 2018-10-22 ENCOUNTER — Inpatient Hospital Stay: Payer: Medicare Other | Attending: Oncology | Admitting: Oncology

## 2018-10-22 ENCOUNTER — Telehealth: Payer: Self-pay | Admitting: *Deleted

## 2018-10-22 VITALS — BP 154/78 | HR 68 | Temp 98.2°F | Resp 18 | Ht 63.0 in | Wt 158.6 lb

## 2018-10-22 DIAGNOSIS — C9110 Chronic lymphocytic leukemia of B-cell type not having achieved remission: Secondary | ICD-10-CM

## 2018-10-22 DIAGNOSIS — Z79899 Other long term (current) drug therapy: Secondary | ICD-10-CM | POA: Diagnosis not present

## 2018-10-22 DIAGNOSIS — D696 Thrombocytopenia, unspecified: Secondary | ICD-10-CM | POA: Insufficient documentation

## 2018-10-22 LAB — CBC WITH DIFFERENTIAL (CANCER CENTER ONLY)
Abs Immature Granulocytes: 0.34 10*3/uL — ABNORMAL HIGH (ref 0.00–0.07)
Basophils Absolute: 0.1 10*3/uL (ref 0.0–0.1)
Basophils Relative: 0 %
Eosinophils Absolute: 0 10*3/uL (ref 0.0–0.5)
Eosinophils Relative: 0 %
HCT: 38.7 % — ABNORMAL LOW (ref 39.0–52.0)
Hemoglobin: 12.1 g/dL — ABNORMAL LOW (ref 13.0–17.0)
Immature Granulocytes: 1 %
Lymphocytes Relative: 76 %
Lymphs Abs: 42.5 10*3/uL — ABNORMAL HIGH (ref 0.7–4.0)
MCH: 30 pg (ref 26.0–34.0)
MCHC: 31.3 g/dL (ref 30.0–36.0)
MCV: 95.8 fL (ref 80.0–100.0)
Monocytes Absolute: 2 10*3/uL — ABNORMAL HIGH (ref 0.1–1.0)
Monocytes Relative: 4 %
Neutro Abs: 10.3 10*3/uL — ABNORMAL HIGH (ref 1.7–7.7)
Neutrophils Relative %: 19 %
Platelet Count: 51 10*3/uL — ABNORMAL LOW (ref 150–400)
RBC: 4.04 MIL/uL — ABNORMAL LOW (ref 4.22–5.81)
RDW: 13.7 % (ref 11.5–15.5)
WBC Count: 55.3 10*3/uL (ref 4.0–10.5)
nRBC: 0 % (ref 0.0–0.2)

## 2018-10-22 NOTE — Progress Notes (Signed)
  [facility] OFFICE PROGRESS NOTE   Diagnosis: CLL, thrombocytopenia  INTERVAL HISTORY:   Mr. Studer was discharged from the hospital 10/19/2018.  He continues prednisone.  No bleeding.  Good appetite.  He reports feeling a little bit "shaky "while on prednisone.  Objective:  Vital signs in last 24 hours:  Blood pressure (!) 154/78, pulse 68, temperature 98.2 F (36.8 C), temperature source Oral, resp. rate 18, height 5\' 3"  (1.6 m), weight 158 lb 9.6 oz (71.9 kg), SpO2 99 %.    HEENT: No thrush, ecchymoses at the right buccal mucosa, multiple resolving ecchymoses over the face Resp: Lungs clear bilaterally Cardio: Regular rate and rhythm GI: No hepatosplenomegaly Vascular: No leg edema   Lymph nodes: Less than 1 cm bilateral cervical nodes, 1-2 cm bilateral axillary nodes Skin: No bleeding at the anterior chest monitor site    Lab Results:  Lab Results  Component Value Date   WBC 55.3 (HH) 10/22/2018   HGB 12.1 (L) 10/22/2018   HCT 38.7 (L) 10/22/2018   MCV 95.8 10/22/2018   PLT 51 (L) 10/22/2018   NEUTROABS 10.3 (H) 10/22/2018    CMP  Lab Results  Component Value Date   NA 139 10/18/2018   K 4.1 10/18/2018   CL 107 10/18/2018   CO2 23 10/18/2018   GLUCOSE 134 (H) 10/18/2018   BUN 19 10/18/2018   CREATININE 1.45 (H) 10/18/2018   CALCIUM 8.4 (L) 10/18/2018   PROT 6.3 (L) 08/16/2016   ALBUMIN 3.0 (L) 08/16/2016   AST 36 08/16/2016   ALT 41 08/16/2016   ALKPHOS 115 08/16/2016   BILITOT 1.4 (H) 08/16/2016   GFRNONAA 46 (L) 10/18/2018   GFRAA 53 (L) 10/18/2018     Medications: I have reviewed the patient's current medications.   Assessment/Plan: 1. CLL ? Review of the peripheral blood smear is consistent with chronic lymphocytic leukemia ? 12/16/2014 Peripheral blood flow cytometry consistent with CLL ? 10/16/2018 MRI brain and cervical spine - Prominent bulky cervical adenopathy within the visualized neck, compatible with history of CLL.  2.thrombocytopenia-likely secondary to chronic lymphocytic leukemia versus "pseudo thrombocytopenia"due to platelet clumping; progressive thrombocytopenia June 2018  Trial of prednisone started 10/17/2018 after admission with a fall/subarachnoid hemorrhage-starting dose 60 mg daily  Prednisone tapered to 40 mg daily beginning 10/22/2018 3.Hospitalization with sepsis/UTI with bacteremia (Enterobacter aerogenes)March 2018 4. Hospitalization for syncopal episode resulting in fall. Developed a small right subarachnoid hemorrhage.  5. MRI brain 10/16/2018 - Focal 1-2 cm osseous lesions involving the right parietal and occipital calvarium as above, indeterminate. Findings are of uncertain significance, with no definite corresponding osseous lesion seen on prior CT.    Disposition: [name] appears stable.  He has CLL with associated thrombocytopenia.  Is unclear whether the thrombocytopenia is related to CLL involving the bone marrow versus associated ITP.  Platelet count has improved with prednisone.  We taper the prednisone to 40 mg daily.  The palpable lymphadenopathy also appears improved.  He will return for a lab and video visit in 1 week.  He will contact us in the interim for new symptoms.    Betsy Coder, MD  10/22/2018  2:00 PM

## 2018-10-22 NOTE — Telephone Encounter (Signed)
Received call report from [place].  "Today's WBC = 55.3."  Message left with results.  Scheduled provider F/U today at 9:00 am.

## 2018-10-22 NOTE — Telephone Encounter (Signed)
Scheduled appt per 5/4 los. ° °Patient aware of appt date and time. °

## 2018-10-23 ENCOUNTER — Telehealth: Payer: Self-pay | Admitting: Cardiology

## 2018-10-23 DIAGNOSIS — S0181XA Laceration without foreign body of other part of head, initial encounter: Secondary | ICD-10-CM | POA: Insufficient documentation

## 2018-10-23 NOTE — Telephone Encounter (Signed)
Spoke with patient. Advised our policy right now is to only allow the patient in the room except under extenuating circumstances. Pt requests that we call his wife after the visit. Advised we can have her on speaker phone during the wound check if okay with him. Pt is agreeable to this plan. He denies additional questions or concerns at this time.  Appointment notes updated with pt's request.

## 2018-10-23 NOTE — Telephone Encounter (Signed)
New Message     Pt is calling is wondering if his wife can come to the wound check appt    Please call back

## 2018-10-25 ENCOUNTER — Telehealth: Payer: Self-pay | Admitting: Cardiology

## 2018-10-25 ENCOUNTER — Telehealth: Payer: Self-pay | Admitting: Oncology

## 2018-10-25 NOTE — Telephone Encounter (Signed)
Spoke with pt and wife and confirmed Webex mtg for 10/29/18. Emailed both patient and wife's emails how to download and access Webex application. Will call if they have any questions.

## 2018-10-25 NOTE — Telephone Encounter (Signed)
Followed up with pt.  Informed that I spoke with Bensimhon's office who informed me that Bensimhon was not his MD, he rounded on pt. He would really like to reestablish with [facility]. Pt informed that I would forward to Dr. [name] and his nurse to address. Pt is hoping for a call by Monday or Tuesday from the nurse/[facility].    (spoke to [place] who will not be with him tomorrow/Monday.  I will forward this note to covering staff, Christen Bame RN, to discuss w/ [name] tomorrow about scheduling in office visit for Tuesday)

## 2018-10-25 NOTE — Telephone Encounter (Signed)
Advised pt to call PCP to discuss concern.  Pt tells me that he can't speak to his PCP until Monday.    Pt and his wife then begin to explain how pt is extremely worried and has tried to reach Dr. Clayborne Dana office but no will call them back.  States he left a message Monday & Tuesday but no one will call them. States that he is worried, wife stating he doesn't feel good, and they want to talk with someone.  I then asked if he was having issues with his heart but they would avoid that and just keep saying that he hasn't spoken with someone since he was discharged and just wants to talk to a cardiologist. He also would like to shower. States he has not showered since implant.   Pt aware I will will look into this and call them back shortly

## 2018-10-25 NOTE — Telephone Encounter (Signed)
  Pt c/o medication issue:  1. Name of Medication: predniSONE (DELTASONE) 20 MG tablet  2. How are you currently taking this medication (dosage and times per day)? As directed  3. Are you having a reaction (difficulty breathing--STAT)?  Shakiness, headache,   4. What is your medication issue? Patient is having some shakiness and headache and thinks it could be coming from the prednisone.

## 2018-10-26 NOTE — Telephone Encounter (Signed)
I would be happy to see him Monday. Candee Furbish, MD

## 2018-10-26 NOTE — Telephone Encounter (Signed)
Spoke with patient and his wife to schedule appointment with Dr. [name]. Patient does not have a smart phone and consents to telephone visit. He states he will have weight, BP and pulse ready for the visit. They were thankful for the call.   YOUR CARDIOLOGY TEAM HAS ARRANGED FOR AN E-VISIT FOR YOUR APPOINTMENT - PLEASE REVIEW IMPORTANT INFORMATION BELOW SEVERAL DAYS PRIOR TO YOUR APPOINTMENT  Due to the recent COVID-19 pandemic, we are transitioning in-person office visits to tele-medicine visits in an effort to decrease unnecessary exposure to our patients, their families, and staff. These visits are billed to your insurance just like a normal visit is. We also encourage you to sign up for MyChart if you have not already done so. You will need a smartphone if possible. For patients that do not have this, we can still complete the visit using a regular telephone but do prefer a smartphone to enable video when possible. You may have a family member that lives with you that can help. If possible, we also ask that you have a blood pressure cuff and scale at home to measure your blood pressure, heart rate and weight prior to your scheduled appointment. Patients with clinical needs that need an in-person evaluation and testing will still be able to come to the office if absolutely necessary. If you have any questions, feel free to call our office.     YOUR PROVIDER WILL BE USING THE FOLLOWING PLATFORM TO COMPLETE YOUR VISIT: Telephone    2-3 DAYS BEFORE YOUR APPOINTMENT  You will receive a telephone call from one of our [place] team members - your caller ID may say "Unknown caller." If this is a video visit, we will walk you through how to get the video launched on your phone. We will remind you check your blood pressure, heart rate and weight prior to your scheduled appointment. If you have an Apple Watch or Kardia, please upload any pertinent ECG strips the day before or morning of your appointment to  [place]. Our staff will also make sure you have reviewed the consent and agree to move forward with your scheduled tele-health visit.     THE DAY OF YOUR APPOINTMENT  Approximately 15 minutes prior to your scheduled appointment, you will receive a telephone call from one of [facility] team - your caller ID may say "Unknown caller."  Our staff will confirm medications, vital signs for the day and any symptoms you may be experiencing. Please have this information available prior to the time of visit start. It may also be helpful for you to have a pad of paper and pen handy for any instructions given during your visit. They will also walk you through joining the smartphone meeting if this is a video visit.    CONSENT FOR TELE-HEALTH VISIT - PLEASE REVIEW  I hereby voluntarily request, consent and authorize [facility] and its employed or contracted physicians, physician assistants, nurse practitioners or other licensed health care professionals (the Practitioner), to provide me with telemedicine health care services (the "Services") as deemed necessary by the treating Practitioner. I acknowledge and consent to receive the Services by the Practitioner via telemedicine. I understand that the telemedicine visit will involve communicating with the Practitioner through live audiovisual communication technology and the disclosure of certain medical information by electronic transmission. I acknowledge that I have been given the opportunity to request an in-person assessment or other available alternative prior to the telemedicine visit and am voluntarily participating in the telemedicine visit.  I understand that I have the right to withhold or withdraw my consent to the use of telemedicine in the course of my care at any time, without affecting my right to future care or treatment, and that the Practitioner or I may terminate the telemedicine visit at any time. I understand that I have the right to inspect  all information obtained and/or recorded in the course of the telemedicine visit and may receive copies of available information for a reasonable fee.  I understand that some of the potential risks of receiving the Services via telemedicine include:  Marland Kitchen Delay or interruption in medical evaluation due to technological equipment failure or disruption; . Information transmitted may not be sufficient (e.g. poor resolution of images) to allow for appropriate medical decision making by the Practitioner; and/or  . In rare instances, security protocols could fail, causing a breach of personal health information.  Furthermore, I acknowledge that it is my responsibility to provide information about my medical history, conditions and care that is complete and accurate to the best of my ability. I acknowledge that Practitioner's advice, recommendations, and/or decision may be based on factors not within their control, such as incomplete or inaccurate data provided by me or distortions of diagnostic images or specimens that may result from electronic transmissions. I understand that the practice of medicine is not an exact science and that Practitioner makes no warranties or guarantees regarding treatment outcomes. I acknowledge that I will receive a copy of this consent concurrently upon execution via email to the email address I last provided but may also request a printed copy by calling the office of [facility].    I understand that my insurance will be billed for this visit.   I have read or had this consent read to me. . I understand the contents of this consent, which adequately explains the benefits and risks of the Services being provided via telemedicine.  . I have been provided ample opportunity to ask questions regarding this consent and the Services and have had my questions answered to my satisfaction. . I give my informed consent for the services to be provided through the use of telemedicine in my  medical care  By participating in this telemedicine visit I agree to the above.

## 2018-10-29 ENCOUNTER — Other Ambulatory Visit: Payer: Self-pay

## 2018-10-29 ENCOUNTER — Ambulatory Visit (INDEPENDENT_AMBULATORY_CARE_PROVIDER_SITE_OTHER): Payer: Medicare Other | Admitting: *Deleted

## 2018-10-29 ENCOUNTER — Inpatient Hospital Stay (HOSPITAL_BASED_OUTPATIENT_CLINIC_OR_DEPARTMENT_OTHER): Payer: Medicare Other | Admitting: Oncology

## 2018-10-29 ENCOUNTER — Inpatient Hospital Stay: Payer: Medicare Other

## 2018-10-29 DIAGNOSIS — D72820 Lymphocytosis (symptomatic): Secondary | ICD-10-CM

## 2018-10-29 DIAGNOSIS — D696 Thrombocytopenia, unspecified: Secondary | ICD-10-CM

## 2018-10-29 DIAGNOSIS — C9110 Chronic lymphocytic leukemia of B-cell type not having achieved remission: Secondary | ICD-10-CM

## 2018-10-29 DIAGNOSIS — Z7952 Long term (current) use of systemic steroids: Secondary | ICD-10-CM

## 2018-10-29 DIAGNOSIS — R55 Syncope and collapse: Secondary | ICD-10-CM

## 2018-10-29 LAB — CBC WITH DIFFERENTIAL (CANCER CENTER ONLY)
Abs Immature Granulocytes: 0.85 10*3/uL — ABNORMAL HIGH (ref 0.00–0.07)
Basophils Absolute: 0.1 10*3/uL (ref 0.0–0.1)
Basophils Relative: 0 %
Eosinophils Absolute: 0.1 10*3/uL (ref 0.0–0.5)
Eosinophils Relative: 0 %
HCT: 43.9 % (ref 39.0–52.0)
Hemoglobin: 13.3 g/dL (ref 13.0–17.0)
Immature Granulocytes: 1 %
Lymphocytes Relative: 82 %
Lymphs Abs: 91.6 10*3/uL — ABNORMAL HIGH (ref 0.7–4.0)
MCH: 29.6 pg (ref 26.0–34.0)
MCHC: 30.3 g/dL (ref 30.0–36.0)
MCV: 97.8 fL (ref 80.0–100.0)
Monocytes Absolute: 2.5 10*3/uL — ABNORMAL HIGH (ref 0.1–1.0)
Monocytes Relative: 2 %
Neutro Abs: 16.3 10*3/uL — ABNORMAL HIGH (ref 1.7–7.7)
Neutrophils Relative %: 15 %
Platelet Count: 51 10*3/uL — ABNORMAL LOW (ref 150–400)
RBC: 4.49 MIL/uL (ref 4.22–5.81)
RDW: 13.9 % (ref 11.5–15.5)
WBC Count: 111.4 10*3/uL (ref 4.0–10.5)
nRBC: 0 % (ref 0.0–0.2)

## 2018-10-29 LAB — CUP PACEART INCLINIC DEVICE CHECK
Date Time Interrogation Session: 20200511144341
Implantable Pulse Generator Implant Date: 20200430

## 2018-10-29 LAB — BASIC METABOLIC PANEL - CANCER CENTER ONLY
Anion gap: 8 (ref 5–15)
BUN: 28 mg/dL — ABNORMAL HIGH (ref 8–23)
CO2: 24 mmol/L (ref 22–32)
Calcium: 9.1 mg/dL (ref 8.9–10.3)
Chloride: 106 mmol/L (ref 98–111)
Creatinine: 1.6 mg/dL — ABNORMAL HIGH (ref 0.61–1.24)
GFR, Est AFR Am: 47 mL/min — ABNORMAL LOW (ref >60)
GFR, Estimated: 41 mL/min — ABNORMAL LOW (ref >60)
Glucose, Bld: 97 mg/dL (ref 70–99)
Potassium: 4.4 mmol/L (ref 3.5–5.1)
Sodium: 138 mmol/L (ref 135–145)

## 2018-10-29 NOTE — Progress Notes (Signed)
[facility] OFFICE VISIT PROGRESS NOTE  I connected with@ on 10/29/18 at 12:40 PM EDT by video and verified that I am speaking with the correct person using two identifiers.     Other persons participating in the visit and their role in the encounter: Wife  Patient's location: Home Provider's location: Office   Diagnosis: CLL  INTERVAL HISTORY:   Mr. Belsito was seen today for a video visit.  He consented to the video visit in lieu of an in person visit secondary to the [place] pandemic. Mr. Cilento continues prednisone at a dose of 40 mg daily.  He reports insomnia and exertional dyspnea since beginning prednisone.  No cough, fever, night sweats, or anorexia.  No change in palpable lymph nodes.  No bleeding.  He is scheduled for virtual visit with electrophysiology and cardiology this week.  He saw ENT. Mr. Hert is ambulatory in the home.    Lab Results:  Lab Results  Component Value Date   WBC 111.4 (HH) 10/29/2018   HGB 13.3 10/29/2018   HCT 43.9 10/29/2018   MCV 97.8 10/29/2018   PLT 51 (L) 10/29/2018   NEUTROABS 16.3 (H) 10/29/2018   Absolute lymphocyte count 91.6 Medications: I have reviewed the patient's current medications.  Assessment/Plan: 1. CLL ? Review of the peripheral blood smear is consistent with chronic lymphocytic leukemia ? 12/16/2014 Peripheral blood flow cytometry consistent with CLL ? 10/16/2018 MRI brain and cervical spine - Prominent bulky cervical adenopathy within the visualized neck,compatible with history of CLL. 2.thrombocytopenia-likely secondary to chronic lymphocytic leukemia versus "pseudo thrombocytopenia"due to platelet clumping; progressive thrombocytopenia June 2018  Trial of prednisone started 10/17/2018 after admission with a fall/subarachnoid hemorrhage-starting dose 60 mg daily  Prednisone tapered to 40 mg daily beginning 10/22/2018  Prednisone taper to 30 mg daily beginning  10/29/2018 3.Hospitalization with sepsis/UTI with bacteremia (Enterobacter aerogenes)March 2018 4. Hospitalization for syncopal episode resulting in fall. Developed a small right subarachnoid hemorrhage.  5. MRI brain 10/16/2018 - Focal 1-2 cm osseous lesions involving the right parietal and occipital calvarium as above, indeterminate. Findings are of uncertain significance, with no definite corresponding osseous lesion seen on prior CT.    Disposition: Mr. Hoque appears unchanged.  The platelet count is stable.  The hemoglobin has corrected into the normal range.  He has developed a marked lymphocytosis, likely secondary to prednisone. I reviewed the CBC data with Mr. Romas and his wife.  I suspect he is having a mild response of the CLL from prednisone, but I doubt the platelet count will completely correct with prednisone. The plan is to taper the prednisone slowly over a few months.  If the platelet count is maintained at 40-50,000 we will follow him with observation.  If the platelet count falls or he develops other symptoms from CLL we will initiate systemic therapy.  The prednisone was tapered to 30 mg daily.  He will return for a lab visit in 1 week and an office visit in 3 weeks.  Mr. Gucciardo will see cardiology in the interim.   I discussed the assessment and treatment plan with the patient. The patient was provided an opportunity to ask questions and all were answered. The patient agreed with the plan and demonstrated an understanding of the instructions.   The patient was advised to call back or seek an in-person evaluation if the symptoms worsen or if the condition fails to improve as anticipated.  I provided 20 minutes of video and  documentation time during this encounter, and > 50% was spent counseling as documented under my assessment & plan.  Betsy Coder ANP/GNP-BC   10/29/2018 1:32 PM

## 2018-10-29 NOTE — Progress Notes (Signed)
ILR wound check in clinic. Steri strips removed. Wound well healed. R waves 1.52mV. Home monitor transmitting nightly. No episodes. Questions answered.

## 2018-10-30 ENCOUNTER — Telehealth: Payer: Self-pay | Admitting: Cardiology

## 2018-10-30 ENCOUNTER — Telehealth: Payer: Self-pay | Admitting: Oncology

## 2018-10-30 NOTE — Telephone Encounter (Signed)
SPOKE WITH PATIENT AND SCHEDULED FOR Thursday 5/14 WITH DR [name].  PT GIVES VERBAL CONSENT FOR PHONE APPT.  YOUR CARDIOLOGY TEAM HAS ARRANGED FOR AN E-VISIT FOR YOUR APPOINTMENT - PLEASE REVIEW IMPORTANT INFORMATION BELOW SEVERAL DAYS PRIOR TO YOUR APPOINTMENT  Due to the recent COVID-19 pandemic, we are transitioning in-person office visits to tele-medicine visits in an effort to decrease unnecessary exposure to our patients, their families, and staff. These visits are billed to your insurance just like a normal visit is. We also encourage you to sign up for MyChart if you have not already done so. You will need a smartphone if possible. For patients that do not have this, we can still complete the visit using a regular telephone but do prefer a smartphone to enable video when possible. You may have a family member that lives with you that can help. If possible, we also ask that you have a blood pressure cuff and scale at home to measure your blood pressure, heart rate and weight prior to your scheduled appointment. Patients with clinical needs that need an in-person evaluation and testing will still be able to come to the office if absolutely necessary. If you have any questions, feel free to call our office.   YOUR PROVIDER WILL BE USING THE FOLLOWING PLATFORM TO COMPLETE YOUR VISIT: phone visit  2-3 [place] will receive a telephone call from one of our [place] team members - your caller ID may say "Unknown caller." If this is a video visit, we will walk you through how to get the video launched on your phone. We will remind you check your blood pressure, heart rate and weight prior to your scheduled appointment. If you have an Apple Watch or Kardia, please upload any pertinent ECG strips the day before or morning of your appointment to [place]. Our staff will also make sure you have reviewed the consent and agree to move forward with your scheduled tele-health visit.    THE DAY OF YOUR APPOINTMENT  Approximately 15 minutes prior to your scheduled appointment, you will receive a telephone call from one of [facility] team - your caller ID may say "Unknown caller."  Our staff will confirm medications, vital signs for the day and any symptoms you may be experiencing. Please have this information available prior to the time of visit start. It may also be helpful for you to have a pad of paper and pen handy for any instructions given during your visit. They will also walk you through joining the smartphone meeting if this is a video visit.  CONSENT FOR TELE-HEALTH VISIT - PLEASE REVIEW  I hereby voluntarily request, consent and authorize [facility] and its employed or contracted physicians, physician assistants, nurse practitioners or other licensed health care professionals (the Practitioner), to provide me with telemedicine health care services (the "Services") as deemed necessary by the treating Practitioner. I acknowledge and consent to receive the Services by the Practitioner via telemedicine. I understand that the telemedicine visit will involve communicating with the Practitioner through live audiovisual communication technology and the disclosure of certain medical information by electronic transmission. I acknowledge that I have been given the opportunity to request an in-person assessment or other available alternative prior to the telemedicine visit and am voluntarily participating in the telemedicine visit.  I understand that I have the right to withhold or withdraw my consent to the use of telemedicine in the course of my care at any time, without affecting my right  to future care or treatment, and that the Practitioner or I may terminate the telemedicine visit at any time. I understand that I have the right to inspect all information obtained and/or recorded in the course of the telemedicine visit and may receive copies of available information for a  reasonable fee.  I understand that some of the potential risks of receiving the Services via telemedicine include:  Marland Kitchen Delay or interruption in medical evaluation due to technological equipment failure or disruption; . Information transmitted may not be sufficient (e.g. poor resolution of images) to allow for appropriate medical decision making by the Practitioner; and/or  . In rare instances, security protocols could fail, causing a breach of personal health information.  Furthermore, I acknowledge that it is my responsibility to provide information about my medical history, conditions and care that is complete and accurate to the best of my ability. I acknowledge that Practitioner's advice, recommendations, and/or decision may be based on factors not within their control, such as incomplete or inaccurate data provided by me or distortions of diagnostic images or specimens that may result from electronic transmissions. I understand that the practice of medicine is not an exact science and that Practitioner makes no warranties or guarantees regarding treatment outcomes. I acknowledge that I will receive a copy of this consent concurrently upon execution via email to the email address I last provided but may also request a printed copy by calling the office of [facility].    I understand that my insurance will be billed for this visit.   I have read or had this consent read to me. . I understand the contents of this consent, which adequately explains the benefits and risks of the Services being provided via telemedicine.  . I have been provided ample opportunity to ask questions regarding this consent and the Services and have had my questions answered to my satisfaction. . I give my informed consent for the services to be provided through the use of telemedicine in my medical care  By participating in this telemedicine visit I agree to the above.

## 2018-10-30 NOTE — Telephone Encounter (Signed)
See phone note 10/25/18 from Kelby Aline., RN

## 2018-10-30 NOTE — Telephone Encounter (Signed)
Scheduled appt per 5/11 los.  Patient aware of appt date and time.  Patient stated he called to have his appts on 5/14 cancelled. I went ahead and cancelled it per the patient request and per 5/12 sch message also.

## 2018-10-30 NOTE — Telephone Encounter (Signed)
New message:    Patient calling stating that he had a ppt, with [facility] I did not see anything concering a appt for him. Patient has sen Chiropractor and [employer].for phone appt.

## 2018-10-31 ENCOUNTER — Emergency Department (HOSPITAL_COMMUNITY): Payer: Medicare Other

## 2018-10-31 ENCOUNTER — Other Ambulatory Visit: Payer: Self-pay

## 2018-10-31 ENCOUNTER — Emergency Department (HOSPITAL_COMMUNITY)
Admission: EM | Admit: 2018-10-31 | Discharge: 2018-11-01 | Disposition: A | Discharge status: Home / Self Care | Payer: Medicare Other | Attending: Emergency Medicine | Admitting: Emergency Medicine

## 2018-10-31 DIAGNOSIS — C9110 Chronic lymphocytic leukemia of B-cell type not having achieved remission: Secondary | ICD-10-CM | POA: Diagnosis not present

## 2018-10-31 DIAGNOSIS — I2510 Atherosclerotic heart disease of native coronary artery without angina pectoris: Secondary | ICD-10-CM | POA: Insufficient documentation

## 2018-10-31 DIAGNOSIS — Z79899 Other long term (current) drug therapy: Secondary | ICD-10-CM | POA: Diagnosis not present

## 2018-10-31 DIAGNOSIS — I129 Hypertensive chronic kidney disease with stage 1 through stage 4 chronic kidney disease, or unspecified chronic kidney disease: Secondary | ICD-10-CM | POA: Insufficient documentation

## 2018-10-31 DIAGNOSIS — R0789 Other chest pain: Secondary | ICD-10-CM | POA: Diagnosis not present

## 2018-10-31 DIAGNOSIS — R0602 Shortness of breath: Secondary | ICD-10-CM | POA: Insufficient documentation

## 2018-10-31 DIAGNOSIS — R079 Chest pain, unspecified: Secondary | ICD-10-CM

## 2018-10-31 DIAGNOSIS — Z1159 Encounter for screening for other viral diseases: Secondary | ICD-10-CM | POA: Diagnosis not present

## 2018-10-31 DIAGNOSIS — N183 Chronic kidney disease, stage 3 (moderate): Secondary | ICD-10-CM | POA: Insufficient documentation

## 2018-10-31 HISTORY — DX: Pain in right foot: M79.671

## 2018-10-31 HISTORY — DX: Hyperglycemia, unspecified: R73.9

## 2018-10-31 HISTORY — DX: Urinary tract infection, site not specified: N39.0

## 2018-10-31 LAB — CBC
HCT: 42.5 % (ref 39.0–52.0)
Hemoglobin: 13.4 g/dL (ref 13.0–17.0)
MCH: 29.9 pg (ref 26.0–34.0)
MCHC: 31.5 g/dL (ref 30.0–36.0)
MCV: 94.9 fL (ref 80.0–100.0)
Platelets: 44 10*3/uL — ABNORMAL LOW (ref 150–400)
RBC: 4.48 MIL/uL (ref 4.22–5.81)
RDW: 13.7 % (ref 11.5–15.5)
WBC: 114 10*3/uL (ref 4.0–10.5)
nRBC: 0 % (ref 0.0–0.2)

## 2018-10-31 LAB — BASIC METABOLIC PANEL
Anion gap: 12 (ref 5–15)
BUN: 31 mg/dL — ABNORMAL HIGH (ref 8–23)
CO2: 20 mmol/L — ABNORMAL LOW (ref 22–32)
Calcium: 9.5 mg/dL (ref 8.9–10.3)
Chloride: 104 mmol/L (ref 98–111)
Creatinine, Ser: 1.92 mg/dL — ABNORMAL HIGH (ref 0.61–1.24)
GFR calc Af Amer: 38 mL/min — ABNORMAL LOW (ref >60)
GFR calc non Af Amer: 33 mL/min — ABNORMAL LOW (ref >60)
Glucose, Bld: 138 mg/dL — ABNORMAL HIGH (ref 70–99)
Potassium: 5.4 mmol/L — ABNORMAL HIGH (ref 3.5–5.1)
Sodium: 136 mmol/L (ref 135–145)

## 2018-10-31 LAB — URINALYSIS, ROUTINE W REFLEX MICROSCOPIC
Bacteria, UA: NONE SEEN
Bilirubin Urine: NEGATIVE
Glucose, UA: NEGATIVE mg/dL
Ketones, ur: NEGATIVE mg/dL
Leukocytes,Ua: NEGATIVE
Nitrite: NEGATIVE
Protein, ur: NEGATIVE mg/dL
Specific Gravity, Urine: 1.023 (ref 1.005–1.030)
pH: 5 (ref 5.0–8.0)

## 2018-10-31 LAB — TROPONIN I
Troponin I: <0.03 ng/mL (ref <0.03)
Troponin I: <0.03 ng/mL (ref <0.03)

## 2018-10-31 LAB — LACTATE DEHYDROGENASE: LDH: 172 U/L (ref 98–192)

## 2018-10-31 LAB — URIC ACID: Uric Acid, Serum: 6.2 mg/dL (ref 3.7–8.6)

## 2018-10-31 NOTE — Discharge Instructions (Addendum)
Call your cardiologist tomorrow.  Plan on repeating your kidney function tests on Monday. COVID test was sent,  results should come back in about 24-48 hours.

## 2018-10-31 NOTE — ED Notes (Signed)
Attempted to hook up pt to monitor. PT was on phone and stated he wanted to finish his call.

## 2018-10-31 NOTE — ED Provider Notes (Signed)
[facility] EMERGENCY DEPARTMENT Provider Note   CSN: [id number] Arrival date & time: 10/31/18  1958   History   Chief Complaint Chief Complaint  Patient presents with   Shortness of Breath    HPI [name] is a 78 y.o. male.  HPI Patient is a 78 year old male with history of nonobstructive CAD, HTN, dyslipidemia, CLL, chronic thrombocytopenia presents with shortness of breath. Patient states that he has had intermittent shortness of breath over the past 3 to 4 days occurs with exertion.  He has had some associated chest tightness that is nonradiating and left-sided.  Patient was recently [place] after a syncopal episode.  He was started on prednisone for concerns for ITP.  He states that he was concerned that the prednisone was causing his shortness of breath and so came in for evaluation.  Denies fever, cough, abdominal pain, urinary symptoms, or lower extremity edema.     Past Medical History:  Diagnosis Date   Acute encephalopathy 08/17/2016   Acute renal failure superimposed on stage 3 chronic kidney disease ([place]) 10/15/2018   AKI (acute kidney injury) ([place]) 08/16/2016   Arthritis    Bacteremia due to Enterobacter species 08/19/2016   Carpal tunnel syndrome, bilateral 10/09/2017   Chest tightness 10/15/2018   CLL (chronic lymphocytic leukemia) ([place]) 12/16/2014   Dyspnea 09/20/2012   Elevated troponin 10/16/2018   Enterobacter sepsis ([place]) 08/19/2016   Fall 10/15/2018   GERD (gastroesophageal reflux disease)    Hepatitis 1966   Drug reaction after taking medication   Hyperglycemia    Hyperlipemia    Hypertension    Hypokalemia 08/16/2016   Lung nodule seen on imaging study    bilateral lungs   Nasal bone fracture 10/15/2018   Right foot pain    SAH (subarachnoid hemorrhage) ([place]) 10/15/2018   Syncope 10/15/2018   Thrombocytopenia ([place]) 10/15/2018   Urinary tract infection with hematuria     Patient Active Problem List   Diagnosis Date Noted   Elevated troponin 10/16/2018   HLD (hyperlipidemia) 10/15/2018   Syncope 10/15/2018   Nasal bone fracture 10/15/2018   Chest tightness 10/15/2018   SAH (subarachnoid hemorrhage) ([place]) 10/15/2018   Acute renal failure superimposed on stage 3 chronic kidney disease ([place]) 10/15/2018   Thrombocytopenia ([place]) 10/15/2018   Fall 10/15/2018   Carpal tunnel syndrome, bilateral 10/09/2017   Right foot pain    Enterobacter sepsis ([place]) 08/19/2016   Bacteremia due to Enterobacter species 08/19/2016   Acute encephalopathy 08/17/2016   Hyperglycemia    Urinary tract infection with hematuria    Hypertension 08/16/2016   AKI (acute kidney injury) ([place]) 08/16/2016   Hypokalemia 08/16/2016   CLL (chronic lymphocytic leukemia) ([place]) 12/16/2014   Dyspnea 09/20/2012    Past Surgical History:  Procedure Laterality Date   APPENDECTOMY     arthroscoyp  2000   left knee   COLONOSCOPY W/ POLYPECTOMY     LOOP RECORDER INSERTION N/A 10/18/2018   Procedure: LOOP RECORDER INSERTION;  Surgeon: [name], MD;  Location: [place] CV LAB;  Service: Cardiovascular;  Laterality: N/A;   LUMBAR LAMINECTOMY/DECOMPRESSION MICRODISCECTOMY  06/27/2012   Procedure: LUMBAR LAMINECTOMY/DECOMPRESSION MICRODISCECTOMY 1 LEVEL;  Surgeon: [name], MD;  Location: [place] NEURO ORS;  Service: Neurosurgery;  Laterality: Bilateral;  bilateral four-five laminectony   SKIN CANCER EXCISION  5-6 years ago   moe-s surgery- basal b   TONSILLECTOMY          Home Medications    Prior to Admission  medications   Medication Sig Start Date End Date Taking? Authorizing Provider  Cholecalciferol (VITAMIN D) 2000 units CAPS Take 2,000 Units by mouth daily.     [provider]  EPINEPHrine (EPIPEN) 0.3 mg/0.3 mL DEVI Inject 0.3 mLs (0.3 mg total) into the muscle once. Patient not taking: Reported on 08/17/2017 05/26/12   Rise Mu, PA-C  Loratadine 10 MG CAPS Take  10 mg by mouth.    [provider]  polymixin-bacitracin (POLYSPORIN) 500-10000 UNIT/GM OINT ointment Apply 1 application topically 2 (two) times daily. 10/18/18   Domenic Polite, MD  predniSONE (DELTASONE) 20 MG tablet Take 3 tablets (60 mg total) by mouth daily. Patient not taking: Reported on 10/29/2018 10/19/18   Domenic Polite, MD  simvastatin (ZOCOR) 40 MG tablet Take 20 mg by mouth daily.     [provider]  Tamsulosin HCl (FLOMAX) 0.4 MG CAPS Take 1 capsule (0.4 mg total) by mouth daily. 06/30/12   Jovita Gamma, MD    Family History Family History  Problem Relation Age of Onset   Sudden death Mother 48       possible botulism   Dementia Father    Seizures Sister     Social History Social History   Tobacco Use   Smoking status: Never Smoker   Smokeless tobacco: Never Used  Substance Use Topics   Alcohol use: Yes    Alcohol/week: 2.0 standard drinks    Types: 2 Cans of beer per week    Comment: rare 2 beers per month   Drug use: No     Allergies   Patient has no known allergies.   Review of Systems Review of Systems  Constitutional: Negative for chills and fever.  HENT: Negative for ear pain and sore throat.   Eyes: Negative for pain and visual disturbance.  Respiratory: Positive for shortness of breath. Negative for cough.   Cardiovascular: Positive for chest pain. Negative for palpitations.  Gastrointestinal: Negative for abdominal pain and vomiting.  Genitourinary: Negative for dysuria and hematuria.  Musculoskeletal: Negative for arthralgias and back pain.  Skin: Negative for color change and rash.  Neurological: Negative for seizures and syncope.  All other systems reviewed and are negative.    Physical Exam Updated Vital Signs BP (!) 160/75    Pulse 68    Temp 98.9 F (37.2 C) (Oral)    Resp 15    Ht 5\' 3"  (1.6 m)    Wt 72.6 kg    SpO2 98%    BMI 28.34 kg/m   Physical Exam Vitals signs and nursing note reviewed.    Constitutional:      Appearance: He is well-developed.  HENT:     Head: Normocephalic and atraumatic.  Eyes:     Conjunctiva/sclera: Conjunctivae normal.  Neck:     Musculoskeletal: Neck supple.  Cardiovascular:     Rate and Rhythm: Normal rate and regular rhythm.     Heart sounds: No murmur.  Pulmonary:     Effort: Pulmonary effort is normal. No respiratory distress.     Breath sounds: Normal breath sounds.  Chest:     Comments: Chest wall bruising and ecchymoses over the area of loop recorder implantation Abdominal:     Palpations: Abdomen is soft.     Tenderness: There is no abdominal tenderness.  Musculoskeletal:     Right lower leg: No edema.     Left lower leg: No edema.  Skin:    General: Skin is warm and dry.  Neurological:     General: No focal deficit present.     Mental Status: He is alert and oriented to person, place, and time.      ED Treatments / Results  Labs (all labs ordered are listed, but only abnormal results are displayed) Labs Reviewed  BASIC METABOLIC PANEL - Abnormal; Notable for the following components:      Result Value   Potassium 5.4 (*)    CO2 20 (*)    Glucose, Bld 138 (*)    BUN 31 (*)    Creatinine, Ser 1.92 (*)    GFR calc non Af Amer 33 (*)    GFR calc Af Amer 38 (*)    All other components within normal limits  CBC - Abnormal; Notable for the following components:   WBC 114.0 (*)    Platelets 44 (*)    All other components within normal limits  URINALYSIS, ROUTINE W REFLEX MICROSCOPIC - Abnormal; Notable for the following components:   Hgb urine dipstick MODERATE (*)    All other components within normal limits  NOVEL CORONAVIRUS, NAA (HOSPITAL ORDER, SEND-OUT TO REF LAB)  TROPONIN I  URIC ACID  LACTATE DEHYDROGENASE  TROPONIN I    EKG EKG Interpretation  Date/Time:  Wednesday Oct 31 2018 63:01:60 EDT Ventricular Rate:  94 PR Interval:  130 QRS Duration: 86 QT Interval:  344 QTC Calculation: 430 R  Axis:   76 Text Interpretation:  Normal sinus rhythm Nonspecific ST abnormality No significant change since last tracing Confirmed by Blanchie Dessert [phone]) on 10/31/2018 8:44:38 PM   Radiology Dg Chest 2 View  Result Date: 10/31/2018 CLINICAL DATA:  Shortness of breath over the last 5 days which are worsening. Recent syncopal episodes. History CLL. EXAM: CHEST - 2 VIEW COMPARISON:  Chest CT 10/15/2018 FINDINGS: Artifact overlies the chest. Loop recorder in place. Heart size is normal. Chronic aortic atherosclerosis. The lungs are clear except for some chronic scarring in the lingula. Vascularity is normal. No effusions. 1 cm nodule shown by CT in the left lower lobe cannot be resolved by frontal radiography, blending with some chronic lingular scarring, but is visible on the lateral view. IMPRESSION: No active cardiopulmonary disease. Chronic scarring in the lingula. Known 1 cm left lower lobe nodule. Electronically Signed   By: Nelson Chimes M.D.   On: 10/31/2018 21:24    Procedures Procedures (including critical care time)  Medications Ordered in ED Medications - No data to display   Initial Impression / Assessment and Plan / ED Course  I have reviewed the triage vital signs and the nursing notes.  Pertinent labs & imaging results that were available during my care of the patient were reviewed by me and considered in my medical decision making (see chart for details).  Patient is a 78 year old male with history of nonobstructive CAD, HTN, dyslipidemia, CLL, chronic thrombocytopenia presents with shortness of breath.  Patient is hemodynamically stable.  Afebrile.  Clear to auscultation bilaterally with no increased work of breathing.  Normal SPO2 on room air.  Patient was recently admitted after a syncopal episode and had a coronary CTA showed stable nonobstructive CAD.  Patient also had a CTA of the chest that showed no pulmonary embolus but did show evidence of active CLL with  splenomegaly, cervical and mediastinal lymphadenopathy.  He was placed on prednisone due to concerns for ITP and has been on a prednisone taper since discharge.  Chest x-ray shows no active cardiopulmonary disease.  Chronic scarring in  the lingula.  Known 1 cm left lower lobe nodule.  EKG is normal sinus rhythm.  No ischemic changes.  Rate 94.  BMP notable for potassium 5.4, CO2 20, BUN 31, creatinine 1.92.  WBC 114 which is up from 111 2 days ago and 55 9 days ago.  Uric acid and LDH unremarkable.   I discussed case with hematology/oncology attending on call.  Did not think that the leukocytosis was the etiology of his symptoms.  Recommended ruling out infectious causes.  Not an indication for admission.  Patient's kidney function is slightly worse from previous.  He states that he has had normal urine output today.  He does not appear volume overloaded on exam.  Patient has no fever, cough, or changes on the chest x-ray to suggest pneumonia.   Initial troponin undetectable.  Will obtain second troponin and if undetectable plan on discharge.  I have low clinical suspicion for ACS as he had recent coronary CTA that showed stable disease.   Patient requesting COVID test.  Send out COVID test sent.   Sutures removed from patient's facial wound.  I recommended that patient call his cardiology clinic tomorrow as well as have repeat BMP on Monday at the cancer clinic to evaluate his kidney function.  Patient signed out 11:20 PM.  Plan is to follow-up repeat troponin and likely discharge.   Final Clinical Impressions(s) / ED Diagnoses   Final diagnoses:  None    ED Discharge Orders    None       Trinidad Curet, MD 10/31/18 [id number]    Blanchie Dessert, MD 11/06/18 [id number]

## 2018-10-31 NOTE — ED Triage Notes (Signed)
Pt states that he has a hx of CLL being treated with predisposed, recently admitted for syncopal episodes. Reports SOB for the past 5 days that is getting worse, denies cough or fevers. Some tightness in chest.

## 2018-11-01 ENCOUNTER — Telehealth (INDEPENDENT_AMBULATORY_CARE_PROVIDER_SITE_OTHER): Payer: Medicare Other | Admitting: Cardiology

## 2018-11-01 ENCOUNTER — Other Ambulatory Visit: Payer: Medicare Other

## 2018-11-01 ENCOUNTER — Other Ambulatory Visit: Payer: Self-pay

## 2018-11-01 ENCOUNTER — Encounter: Payer: Self-pay | Admitting: Cardiology

## 2018-11-01 ENCOUNTER — Telehealth: Payer: Self-pay | Admitting: *Deleted

## 2018-11-01 ENCOUNTER — Ambulatory Visit: Payer: Medicare Other | Admitting: Oncology

## 2018-11-01 VITALS — BP 159/80 | Ht 63.0 in | Wt 159.0 lb

## 2018-11-01 DIAGNOSIS — R55 Syncope and collapse: Secondary | ICD-10-CM

## 2018-11-01 DIAGNOSIS — C9110 Chronic lymphocytic leukemia of B-cell type not having achieved remission: Secondary | ICD-10-CM

## 2018-11-01 DIAGNOSIS — I2510 Atherosclerotic heart disease of native coronary artery without angina pectoris: Secondary | ICD-10-CM | POA: Insufficient documentation

## 2018-11-01 DIAGNOSIS — D696 Thrombocytopenia, unspecified: Secondary | ICD-10-CM

## 2018-11-01 DIAGNOSIS — I609 Nontraumatic subarachnoid hemorrhage, unspecified: Secondary | ICD-10-CM

## 2018-11-01 NOTE — Telephone Encounter (Signed)
Reports he went to ER last night for shortness of breath and was sent home. Dr. [name] saw him today. Asking for [name] to review his labs and give his input on them. Message forwarded to MD>

## 2018-11-01 NOTE — Patient Instructions (Signed)
Medication Instructions:  The current medical regimen is effective;  continue present plan and medications.  If you need a refill on your cardiac medications before your next appointment, please call your pharmacy.   Follow-Up: Follow up in 6 months with Dr. Skains.  You will receive a letter in the mail 2 months before you are due.  Please call us when you receive this letter to schedule your follow up appointment.   Thank you for choosing [facility]!!       

## 2018-11-01 NOTE — Progress Notes (Signed)
Virtual Visit via Telephone Note   This visit type was conducted due to national recommendations for restrictions regarding the COVID-19 Pandemic (e.g. social distancing) in an effort to limit this patient's exposure and mitigate transmission in our community.  Due to his co-morbid illnesses, this patient is at least at moderate risk for complications without adequate follow up.  This format is felt to be most appropriate for this patient at this time.  The patient did not have access to video technology/had technical difficulties with video requiring transitioning to audio format only (telephone).  All issues noted in this document were discussed and addressed.  No physical exam could be performed with this format.  Please refer to the patient's chart for his  consent to telehealth for [facility].   Date:  11/01/2018   ID:  [name], DOB [birth year], MRN [id number]  Patient Location: Home Provider Location: Home  PCP:  [name], MD  Cardiologist:  Candee Furbish, MD [facility] Electrophysiologist:  None   Evaluation Performed:  Follow-Up Visit  Chief Complaint:  Hosp f/u syncope, CLL syncope  History of Present Illness:    [name] is a 78 y.o. male with CLL, recent syncopal episode end of April resulting in small subarachnoid hemorrhage, fell face forward, now on prednisone for thrombocytopenia, no evidence of PE on CT scan and no evidence of obstructive coronary artery disease on coronary CT.  Seen by Dr. Haroldine Laws in the hospital.  Discussed with Dr. Tamala Julian as well.  Has a loop recorder in place that has been normal.  Has had more dyspnea on exertion mild chest pressure pleuritic no swelling.  Went to the ER yesterday at 11 PM.  When there, his EKG was unchanged troponin was negative labs show creatinine of 1.9 mildly worsened and white count was 114 from 50.  Oncology was spoken to and felt like this was unlikely the cause of his symptoms.  Delta troponin normal.   COVID-19 test from 10/31/2018 is currently pending.  Cardiogram 10/16/2018:   1. The left ventricle has hyperdynamic systolic function, with an ejection fraction of >65%. The cavity size was normal. Left ventricular diastolic Doppler parameters are consistent with pseudonormal.  2. There is a dynamic mid LV gradient of 25 mmHg.  3. The right ventricle has normal systolc function. There is no increase in right ventricular wall thickness.  4. No evidence of mitral valve stenosis.  5. No stenosis of the aortic valve.  Stable CAD 60% mid LAD.  Unlikely because of syncope.  Bisoprolol 10 mg was held because of heart rates in the 50s.  Normal without QT prolongation.  EP Dr. Curt Bears placed implantable loop recorder.  Reviewed Dr. Gearldine Shown note from 10/22/2018  BP used to be 125/80. Off bystolic.  Sob mild with stairs, tested for covid. Daughter physician [place]. Worried. Went to ER for COVID test.   The patient does not have symptoms concerning for COVID-19 infection (fever, chills, cough, or new shortness of breath).    Past Medical History:  Diagnosis Date  . Acute encephalopathy 08/17/2016  . Acute renal failure superimposed on stage 3 chronic kidney disease ([place]) 10/15/2018  . AKI (acute kidney injury) ([place]) 08/16/2016  . Arthritis   . Bacteremia due to Enterobacter species 08/19/2016  . Carpal tunnel syndrome, bilateral 10/09/2017  . Chest tightness 10/15/2018  . CLL (chronic lymphocytic leukemia) ([place]) 12/16/2014  . Dyspnea 09/20/2012  . Elevated troponin 10/16/2018  . Enterobacter sepsis ([place]) 08/19/2016  . Fall 10/15/2018  .  GERD (gastroesophageal reflux disease)   . Hepatitis 1966   Drug reaction after taking medication  . Hyperglycemia   . Hyperlipemia   . Hypertension   . Hypokalemia 08/16/2016  . Lung nodule seen on imaging study    bilateral lungs  . Nasal bone fracture 10/15/2018  . Right foot pain   . SAH (subarachnoid hemorrhage) ([place]) 10/15/2018  . Syncope 10/15/2018  .  Thrombocytopenia ([place]) 10/15/2018  . Urinary tract infection with hematuria    Past Surgical History:  Procedure Laterality Date  . APPENDECTOMY    . arthroscoyp  2000   left knee  . COLONOSCOPY W/ POLYPECTOMY    . LOOP RECORDER INSERTION N/A 10/18/2018   Procedure: LOOP RECORDER INSERTION;  Surgeon: [name], MD;  Location: [place] CV LAB;  Service: Cardiovascular;  Laterality: N/A;  . LUMBAR LAMINECTOMY/DECOMPRESSION MICRODISCECTOMY  06/27/2012   Procedure: LUMBAR LAMINECTOMY/DECOMPRESSION MICRODISCECTOMY 1 LEVEL;  Surgeon: [name], MD;  Location: [place] NEURO ORS;  Service: Neurosurgery;  Laterality: Bilateral;  bilateral four-five laminectony  . SKIN CANCER EXCISION  5-6 years ago   moe-s surgery- basal b  . TONSILLECTOMY       Current Meds  Medication Sig  . Cholecalciferol (VITAMIN D) 2000 units CAPS Take 2,000 Units by mouth daily.   Marland Kitchen EPINEPHrine (EPIPEN) 0.3 mg/0.3 mL DEVI Inject 0.3 mLs (0.3 mg total) into the muscle once.  . Loratadine 10 MG CAPS Take 10 mg by mouth.  . polymixin-bacitracin (POLYSPORIN) 500-10000 UNIT/GM OINT ointment Apply 1 application topically 2 (two) times daily.  . predniSONE (DELTASONE) 20 MG tablet Take 30 mg by mouth daily.  . simvastatin (ZOCOR) 40 MG tablet Take 20 mg by mouth daily.   . Tamsulosin HCl (FLOMAX) 0.4 MG CAPS Take 1 capsule (0.4 mg total) by mouth daily.     Allergies:   Patient has no known allergies.   Social History   Tobacco Use  . Smoking status: Never Smoker  . Smokeless tobacco: Never Used  Substance Use Topics  . Alcohol use: Yes    Alcohol/week: 2.0 standard drinks    Types: 2 Cans of beer per week    Comment: rare 2 beers per month  . Drug use: No     Family Hx: The patient's family history includes Dementia in his father; Seizures in his sister; Sudden death (age of onset: 52) in his mother.  ROS:   Please see the history of present illness.    No orthopnea PND bleeding All other systems  reviewed and are negative.   Prior CV studies:   The following studies were reviewed today: ECHO:11/01/18  1. The left ventricle has hyperdynamic systolic function, with an ejection fraction of >65%. The cavity size was normal. Left ventricular diastolic Doppler parameters are consistent with pseudonormal.  2. There is a dynamic mid LV gradient of 25 mmHg.  3. The right ventricle has normal systolc function. There is no increase in right ventricular wall thickness.  4. No evidence of mitral valve stenosis.  5. No stenosis of the aortic valve.  Labs/Other Tests and Data Reviewed:    EKG:  An ECG dated 10/31/18 was personally reviewed today and demonstrated:  Sinus rhythm with nonspecific ST-T wave changes  Recent Labs: 10/31/2018: BUN 31; Creatinine, Ser 1.92; Hemoglobin 13.4; Platelets 44; Potassium 5.4; Sodium 136   Recent Lipid Panel Lab Results  Component Value Date/Time   CHOL 106 10/16/2018 03:39 AM   TRIG 125 10/16/2018 03:39 AM  HDL 20 (L) 10/16/2018 03:39 AM   CHOLHDL 5.3 10/16/2018 03:39 AM   LDLCALC 61 10/16/2018 03:39 AM    Wt Readings from Last 3 Encounters:  11/01/18 159 lb (72.1 kg)  10/31/18 160 lb (72.6 kg)  10/22/18 158 lb 9.6 oz (71.9 kg)     Objective:    Vital Signs:  BP (!) 159/80   Ht 5\' 3"  (1.6 m)   Wt 159 lb (72.1 kg)   SpO2 97%   BMI 28.17 kg/m    VITAL SIGNS:  reviewed pleasant on phone, alert, normal breathing efforts noted.  ASSESSMENT & PLAN:    Nonobstructive coronary artery disease - Reassuring coronary artery CT, nonobstructive disease 50 to 60% mid LAD.  Chronically occluded RCA with collaterals.  No further intervention felt necessary.  Continue with secondary risk factor prevention.  Unfortunately aspirin was discontinued due to severe thrombocytopenia and beta-blockers were discontinued due to persistent bradycardia.  Syncope Implantable loop recorder Dr. Curt Bears placed.  Perhaps this was a hypotensive episode?  No pulmonary  embolism.  Subarachnoid hemorrhage - Small trace posttraumatic subarachnoid hemorrhage.  Neurosurgery conservative management.  Repeat head CT stable.  CLL with severe thrombocytopenia - Bulky lymphadenopathy, prednisone 60 mg started, [name]. - Recent labs with increased potassium, markedly increased white count.  Instructed him to get in touch with hematology team.  Multiple questions answered.  High complex medical decision.  Extensive review of medical records.   COVID-19 Education: The signs and symptoms of COVID-19 were discussed with the patient and how to seek care for testing (follow up with PCP or arrange E-visit).  The importance of social distancing was discussed today.  Time:   Today, I have spent 25 minutes with the patient with telehealth technology discussing the above problems.     Medication Adjustments/Labs and Tests Ordered: Current medicines are reviewed at length with the patient today.  Concerns regarding medicines are outlined above.   Tests Ordered: No orders of the defined types were placed in this encounter.   Medication Changes: No orders of the defined types were placed in this encounter.   Disposition:  Follow up in 6 month(s)  Signed, Candee Furbish, MD  11/01/2018 4:45 PM    [facility]

## 2018-11-02 LAB — NOVEL CORONAVIRUS, NAA (HOSP ORDER, SEND-OUT TO REF LAB; TAT 18-24 HRS): SARS-CoV-2, NAA: NOT DETECTED

## 2018-11-02 NOTE — Telephone Encounter (Signed)
Called back and informed him [name] saw nothing in his labs of concern. F/U on 11/05/18 as scheduled. Continue same prednisone

## 2018-11-05 ENCOUNTER — Inpatient Hospital Stay: Payer: Medicare Other

## 2018-11-05 ENCOUNTER — Telehealth: Payer: Self-pay | Admitting: Cardiology

## 2018-11-05 ENCOUNTER — Telehealth (HOSPITAL_COMMUNITY): Payer: Self-pay

## 2018-11-05 ENCOUNTER — Inpatient Hospital Stay (HOSPITAL_BASED_OUTPATIENT_CLINIC_OR_DEPARTMENT_OTHER): Payer: Medicare Other | Admitting: Oncology

## 2018-11-05 ENCOUNTER — Other Ambulatory Visit: Payer: Self-pay

## 2018-11-05 ENCOUNTER — Telehealth: Payer: Self-pay | Admitting: Oncology

## 2018-11-05 VITALS — BP 175/90 | HR 92 | Temp 98.2°F | Resp 19 | Ht 63.0 in | Wt 150.3 lb

## 2018-11-05 DIAGNOSIS — D696 Thrombocytopenia, unspecified: Secondary | ICD-10-CM

## 2018-11-05 DIAGNOSIS — C9110 Chronic lymphocytic leukemia of B-cell type not having achieved remission: Secondary | ICD-10-CM

## 2018-11-05 DIAGNOSIS — Z79899 Other long term (current) drug therapy: Secondary | ICD-10-CM

## 2018-11-05 LAB — CBC WITH DIFFERENTIAL (CANCER CENTER ONLY)
Abs Immature Granulocytes: 0.65 10*3/uL — ABNORMAL HIGH (ref 0.00–0.07)
Basophils Absolute: 0.1 10*3/uL (ref 0.0–0.1)
Basophils Relative: 0 %
Eosinophils Absolute: 0.1 10*3/uL (ref 0.0–0.5)
Eosinophils Relative: 0 %
HCT: 46.4 % (ref 39.0–52.0)
Hemoglobin: 13.9 g/dL (ref 13.0–17.0)
Immature Granulocytes: 1 %
Lymphocytes Relative: 86 %
Lymphs Abs: 102.6 10*3/uL — ABNORMAL HIGH (ref 0.7–4.0)
MCH: 29.3 pg (ref 26.0–34.0)
MCHC: 30 g/dL (ref 30.0–36.0)
MCV: 97.7 fL (ref 80.0–100.0)
Monocytes Absolute: 1.7 10*3/uL — ABNORMAL HIGH (ref 0.1–1.0)
Monocytes Relative: 1 %
Neutro Abs: 13.9 10*3/uL — ABNORMAL HIGH (ref 1.7–7.7)
Neutrophils Relative %: 12 %
Platelet Count: 31 10*3/uL — ABNORMAL LOW (ref 150–400)
RBC: 4.75 MIL/uL (ref 4.22–5.81)
RDW: 13.9 % (ref 11.5–15.5)
WBC Count: 119 10*3/uL (ref 4.0–10.5)
nRBC: 0 % (ref 0.0–0.2)

## 2018-11-05 NOTE — Progress Notes (Signed)
  [facility] OFFICE PROGRESS NOTE   Diagnosis: CLL  INTERVAL HISTORY:   Mr. Pro presents for an unscheduled visit.  He has developed a "rash "at  the left upper anterior chest wall.  He has lost weight, but reports a good appetite.  He has intermittent dyspnea.  No bleeding.  He continues prednisone at a dose of 30 mg daily.  Objective:  Vital signs in last 24 hours:  Blood pressure (!) 175/90, pulse 92, temperature 98.2 F (36.8 C), temperature source Oral, resp. rate 19, height '5\' 3"'$  (1.6 m), weight 150 lb 4.8 oz (68.2 kg), SpO2 99 %.    HEENT: No thrush Lymphatics: 1 cm bilateral axillary nodes Resp: Lungs clear bilaterally, no respiratory distress Cardio: Regular rate and rhythm GI: No hepatosplenomegaly Vascular: No leg edema  Skin: Resolving ecchymosis anterior chest wall  Portacath/PICC-without erythema   Lab Results:  Lab Results  Component Value Date   WBC 119.0 (HH) 11/05/2018   HGB 13.9 11/05/2018   HCT 46.4 11/05/2018   MCV 97.7 11/05/2018   PLT 31 (L) 11/05/2018   NEUTROABS 13.9 (H) 11/05/2018    CMP  Lab Results  Component Value Date   NA 136 10/31/2018   K 5.4 (H) 10/31/2018   CL 104 10/31/2018   CO2 20 (L) 10/31/2018   GLUCOSE 138 (H) 10/31/2018   BUN 31 (H) 10/31/2018   CREATININE 1.92 (H) 10/31/2018   CALCIUM 9.5 10/31/2018   PROT 6.3 (L) 08/16/2016   ALBUMIN 3.0 (L) 08/16/2016   AST 36 08/16/2016   ALT 41 08/16/2016   ALKPHOS 115 08/16/2016   BILITOT 1.4 (H) 08/16/2016   GFRNONAA 33 (L) 10/31/2018   GFRAA 38 (L) 10/31/2018    Medications: I have reviewed the patient's current medications.   Assessment/Plan: 1. CLL ? Review of the peripheral blood smear is consistent with chronic lymphocytic leukemia ? 12/16/2014 Peripheral blood flow cytometry consistent with CLL ? 10/16/2018 MRI brain and cervical spine - Prominent bulky cervical adenopathy within the visualized neck,compatible with history of CLL.  2.thrombocytopenia-likely secondary to chronic lymphocytic leukemia versus "pseudo thrombocytopenia"due to platelet clumping; progressive thrombocytopenia June 2018  Trial of prednisone started 10/17/2018 after admission with a fall/subarachnoid hemorrhage-starting dose 60 mg daily  Prednisone tapered to 40 mg daily beginning 10/22/2018  Prednisone taper to 30 mg daily beginning 10/29/2018 3.Hospitalization with sepsis/UTI with bacteremia (Enterobacter aerogenes)March 2018 4. Hospitalization for syncopal episode resulting in fall. Developed a small right subarachnoid hemorrhage.  5. MRI brain 10/16/2018 - Focal 1-2 cm osseous lesions involving the right parietal and occipital calvarium as above, indeterminate. Findings are of uncertain significance, with no definite corresponding osseous lesion seen on prior CT.   Disposition: Mr. Yoho has CLL.  He has persistent thrombocytopenia despite prednisone for the past 3 weeks.  I suspect the thrombocytopenia secondary to CLL involving the bone marrow.  His weight loss may be secondary to prednisone and decreased lymphadenopathy.  I discussed treatment options with Mr. Phineas Real.  We discussed bendamustine/rituximab and acalabrutinib.  He agrees to proceed with a diagnostic bone marrow biopsy prior to initiating systemic therapy.  He will continue prednisone for now.  He will return for an office visit in 1 week.  The etiology of the shortness of breath is unclear.  This occurs intermittently.  I doubt the dyspnea is directly related to the CLL.  Betsy Coder, MD  11/05/2018  10:38 AM

## 2018-11-05 NOTE — Telephone Encounter (Signed)
Scheduled appt per 5/18 los.  Was not able to leave VM of scheduled appt date and time,

## 2018-11-05 NOTE — Telephone Encounter (Signed)
Pt called to report that he has noticed some bruising at his Loop Recorder site as of yesterday.. he says it measures 3 1/2-3 inches wide... there is no drainage, no pain, no edema.. he says it has improved some since yesterday. He also denies any strenuous upper body the last few days. I advised him I would continue to monitor but will forward to the Device Nurses for review.. he is [facility] this morning and will be home after 10:45am.

## 2018-11-05 NOTE — Telephone Encounter (Signed)
Agree with monitoring for now.

## 2018-11-05 NOTE — Telephone Encounter (Signed)
New Message    Pt is calling and has questions about his loop recorder. He says he has bruising at wound site    Please call

## 2018-11-06 ENCOUNTER — Telehealth (HOSPITAL_COMMUNITY): Payer: Self-pay

## 2018-11-07 ENCOUNTER — Telehealth: Payer: Self-pay | Admitting: *Deleted

## 2018-11-07 NOTE — Telephone Encounter (Addendum)
Called to report his breathing has been much easier for the last few days. Currently on Prednisone 30 mg daily. Asking when taper will begin and for MD to predict how long it will take to taper him off the Prednisone? Per [name]: Will decide when to taper after bone marrow results are obtained and taper can take 1-2 weeks.

## 2018-11-08 ENCOUNTER — Other Ambulatory Visit: Payer: Self-pay | Admitting: Radiology

## 2018-11-08 ENCOUNTER — Telehealth: Payer: Self-pay | Admitting: *Deleted

## 2018-11-08 NOTE — Telephone Encounter (Signed)
Left message with concerns about bone marrow biopsy tomorrow. Asking if staff there will be able to manage it with minimal bleeding due to his low platelet count? Also asking if we will be following his kidney function? His PCP in [place] wants to be sure we are following this. Attempted to call him back--no answer or voice mail.

## 2018-11-09 ENCOUNTER — Other Ambulatory Visit: Payer: Self-pay

## 2018-11-09 ENCOUNTER — Encounter (HOSPITAL_COMMUNITY): Payer: Self-pay

## 2018-11-09 ENCOUNTER — Ambulatory Visit (HOSPITAL_COMMUNITY)
Admission: RE | Admit: 2018-11-09 | Discharge: 2018-11-09 | Disposition: A | Discharge status: Home / Self Care | Payer: Medicare Other | Source: Ambulatory Visit | Attending: Oncology | Admitting: Oncology

## 2018-11-09 DIAGNOSIS — R739 Hyperglycemia, unspecified: Secondary | ICD-10-CM | POA: Insufficient documentation

## 2018-11-09 DIAGNOSIS — Z79899 Other long term (current) drug therapy: Secondary | ICD-10-CM | POA: Insufficient documentation

## 2018-11-09 DIAGNOSIS — D696 Thrombocytopenia, unspecified: Secondary | ICD-10-CM | POA: Diagnosis not present

## 2018-11-09 DIAGNOSIS — N183 Chronic kidney disease, stage 3 (moderate): Secondary | ICD-10-CM | POA: Diagnosis not present

## 2018-11-09 DIAGNOSIS — C9110 Chronic lymphocytic leukemia of B-cell type not having achieved remission: Secondary | ICD-10-CM | POA: Insufficient documentation

## 2018-11-09 DIAGNOSIS — E785 Hyperlipidemia, unspecified: Secondary | ICD-10-CM | POA: Insufficient documentation

## 2018-11-09 DIAGNOSIS — K219 Gastro-esophageal reflux disease without esophagitis: Secondary | ICD-10-CM | POA: Diagnosis not present

## 2018-11-09 DIAGNOSIS — I129 Hypertensive chronic kidney disease with stage 1 through stage 4 chronic kidney disease, or unspecified chronic kidney disease: Secondary | ICD-10-CM | POA: Insufficient documentation

## 2018-11-09 DIAGNOSIS — M1990 Unspecified osteoarthritis, unspecified site: Secondary | ICD-10-CM | POA: Insufficient documentation

## 2018-11-09 LAB — CBC WITH DIFFERENTIAL/PLATELET
Abs Immature Granulocytes: 0.45 10*3/uL — ABNORMAL HIGH (ref 0.00–0.07)
Basophils Absolute: 0.1 10*3/uL (ref 0.0–0.1)
Basophils Relative: 0 %
Eosinophils Absolute: 0.1 10*3/uL (ref 0.0–0.5)
Eosinophils Relative: 0 %
HCT: 45.9 % (ref 39.0–52.0)
Hemoglobin: 14 g/dL (ref 13.0–17.0)
Immature Granulocytes: 0 %
Lymphocytes Relative: 87 %
Lymphs Abs: 94.1 10*3/uL — ABNORMAL HIGH (ref 0.7–4.0)
MCH: 30.2 pg (ref 26.0–34.0)
MCHC: 30.5 g/dL (ref 30.0–36.0)
MCV: 99.1 fL (ref 80.0–100.0)
Monocytes Absolute: 3 10*3/uL — ABNORMAL HIGH (ref 0.1–1.0)
Monocytes Relative: 3 %
Neutro Abs: 11.3 10*3/uL — ABNORMAL HIGH (ref 1.7–7.7)
Neutrophils Relative %: 10 %
Platelets: 26 10*3/uL — CL (ref 150–400)
RBC: 4.63 MIL/uL (ref 4.22–5.81)
RDW: 13.9 % (ref 11.5–15.5)
WBC: 109 10*3/uL (ref 4.0–10.5)
nRBC: 0 % (ref 0.0–0.2)

## 2018-11-09 LAB — PROTIME-INR
INR: 0.9 (ref 0.8–1.2)
Prothrombin Time: 12.5 seconds (ref 11.4–15.2)

## 2018-11-09 LAB — BASIC METABOLIC PANEL
Anion gap: 8 (ref 5–15)
BUN: 33 mg/dL — ABNORMAL HIGH (ref 8–23)
CO2: 23 mmol/L (ref 22–32)
Calcium: 8.8 mg/dL — ABNORMAL LOW (ref 8.9–10.3)
Chloride: 108 mmol/L (ref 98–111)
Creatinine, Ser: 1.69 mg/dL — ABNORMAL HIGH (ref 0.61–1.24)
GFR calc Af Amer: 44 mL/min — ABNORMAL LOW (ref >60)
GFR calc non Af Amer: 38 mL/min — ABNORMAL LOW (ref >60)
Glucose, Bld: 85 mg/dL (ref 70–99)
Potassium: 4.3 mmol/L (ref 3.5–5.1)
Sodium: 139 mmol/L (ref 135–145)

## 2018-11-09 MED ORDER — FENTANYL CITRATE (PF) 100 MCG/2ML IJ SOLN
INTRAMUSCULAR | Status: AC | PRN
  Administered 2018-11-09: 50 ug via INTRAVENOUS

## 2018-11-09 MED ORDER — FLUMAZENIL 0.5 MG/5ML IV SOLN
INTRAVENOUS | Status: AC
  Filled 2018-11-09: qty 5

## 2018-11-09 MED ORDER — HYDROCODONE-ACETAMINOPHEN 5-325 MG PO TABS: 1.0000 | ORAL_TABLET | ORAL | Status: DC | PRN

## 2018-11-09 MED ORDER — NALOXONE HCL 0.4 MG/ML IJ SOLN
INTRAMUSCULAR | Status: AC
  Filled 2018-11-09: qty 1

## 2018-11-09 MED ORDER — LIDOCAINE HCL (PF) 1 % IJ SOLN
INTRAMUSCULAR | Status: AC | PRN
  Administered 2018-11-09: 10 mL

## 2018-11-09 MED ORDER — SODIUM CHLORIDE 0.9 % IV SOLN
INTRAVENOUS | Status: DC
  Administered 2018-11-09: 08:00:00 via INTRAVENOUS

## 2018-11-09 MED ORDER — FENTANYL CITRATE (PF) 100 MCG/2ML IJ SOLN
INTRAMUSCULAR | Status: AC
  Filled 2018-11-09: qty 2

## 2018-11-09 MED ORDER — MIDAZOLAM HCL 2 MG/2ML IJ SOLN
INTRAMUSCULAR | Status: AC
  Filled 2018-11-09: qty 4

## 2018-11-09 MED ORDER — MIDAZOLAM HCL 2 MG/2ML IJ SOLN
INTRAMUSCULAR | Status: AC | PRN
  Administered 2018-11-09 (×2): 1 mg via INTRAVENOUS

## 2018-11-09 NOTE — Discharge Instructions (Signed)
Moderate Conscious Sedation, Adult, Care After These instructions provide you with information about caring for yourself after your procedure. Your health care provider may also give you more specific instructions. Your treatment has been planned according to current medical practices, but problems sometimes occur. Call your health care provider if you have any problems or questions after your procedure. What can I expect after the procedure? After your procedure, it is common:  To feel sleepy for several hours.  To feel clumsy and have poor balance for several hours.  To have poor judgment for several hours.  To vomit if you eat too soon. Follow these instructions at home: For at least 24 hours after the procedure:   Do not: ? Participate in activities where you could fall or become injured. ? Drive. ? Use heavy machinery. ? Drink alcohol. ? Take sleeping pills or medicines that cause drowsiness. ? Make important decisions or sign legal documents. ? Take care of children on your own.  Rest. Eating and drinking  Follow the diet recommended by your health care provider.  If you vomit: ? Drink water, juice, or soup when you can drink without vomiting. ? Make sure you have little or no nausea before eating solid foods. General instructions  Have a responsible adult stay with you until you are awake and alert.  Take over-the-counter and prescription medicines only as told by your health care provider.  If you smoke, do not smoke without supervision.  Keep all follow-up visits as told by your health care provider. This is important. Contact a health care provider if:  You keep feeling nauseous or you keep vomiting.  You feel light-headed.  You develop a rash.  You have a fever. Get help right away if:  You have trouble breathing. This information is not intended to replace advice given to you by your health care provider. Make sure you discuss any questions you have  with your health care provider. Document Released: 03/27/2013 Document Revised: 11/09/2015 Document Reviewed: 09/26/2015 Elsevier Interactive Patient Education  2019 [place].   Bone Marrow Aspiration and Bone Marrow Biopsy, Adult, Care After This sheet gives you information about how to care for yourself after your procedure. Your health care provider may also give you more specific instructions. If you have problems or questions, contact your health care provider. What can I expect after the procedure? After the procedure, it is common to have:  Mild pain and tenderness.  Swelling.  Bruising. Follow these instructions at home: Puncture site care      Follow instructions from your health care provider about how to take care of the puncture site. Make sure you: ? Wash your hands with soap and water before you change your bandage (dressing). If soap and water are not available, use hand sanitizer. ? Change your dressing as told by your health care provider.  You may remove your dressing tomorrow.  Check your puncture siteevery day for signs of infection. Check for: ? More redness, swelling, or pain. ? More fluid or blood. ? Warmth. ? Pus or a bad smell. General instructions  Take over-the-counter and prescription medicines only as told by your health care provider.  Do not take baths, swim, or use a hot tub until your health care provider approves. Ask if you can take a shower or have a sponge bath.  You may shower tomorrow.  Return to your normal activities as told by your health care provider. Ask your health care provider what activities are  safe for you.  Do not drive for 24 hours if you were given a medicine to help you relax (sedative) during your procedure.  Keep all follow-up visits as told by your health care provider. This is important. Contact a health care provider if:  Your pain is not controlled with medicine. Get help right away if:  You have a  fever.  You have more redness, swelling, or pain around the puncture site.  You have more fluid or blood coming from the puncture site.  Your puncture site feels warm to the touch.  You have pus or a bad smell coming from the puncture site. These symptoms may represent a serious problem that is an emergency. Do not wait to see if the symptoms will go away. Get medical help right away. Call your local emergency services (911 in the [place] Do not drive yourself to the hospital. Summary  After the procedure, it is common to have mild pain, tenderness, swelling, and bruising.  Follow instructions from your health care provider about how to take care of the puncture site.  Get help right away if you have any symptoms of infection or if you have more blood or fluid coming from the puncture site. This information is not intended to replace advice given to you by your health care provider. Make sure you discuss any questions you have with your health care provider. Document Released: 12/24/2004 Document Revised: 09/19/2017 Document Reviewed: 11/18/2015 Elsevier Interactive Patient Education  2019 [employer].

## 2018-11-09 NOTE — Procedures (Signed)
  Procedure: CT bone marrow biopsy R iliac EBL:   minimal Complications:  none immediate  See full dictation in [facility].  Dillard Cannon MD Main # [phone] Pager  [phone]

## 2018-11-09 NOTE — Progress Notes (Signed)
CRITICAL VALUE ALERT  Critical Value:  WBC 109   PLT 26  Date & Time Notied:  11/09/2018 08:16  Provider Notified: Brynda Greathouse, PA  Orders Received/Actions taken: no orders given, PA to see patient now

## 2018-11-09 NOTE — H&P (Signed)
Chief Complaint: Patient was seen in consultation today for CLL, abnormal labs  Referring Physician(s): Sherrill,Gary B  Supervising Physician: Arne Cleveland  Patient Status: [facility] - Out-pt  History of Present Illness: [name] is a 78 y.o. male with past medical history of chronic kidney disease, cardiac arrhythmia currently with loop recorder, HLD, HTN, and CLL with thrombocytopenia who presents with recent worsening of his thrombocytopenia despite prednisone. IR consulted for bone marrow biopsy at the request of [name].   Patient presents for procedure today in his usual state of health.  He does state he has had increased bleeding from multiple scabbed wounds which keep re-opening. He underwent a skin lesion biopsy on Wednesday which still has signs of fresh bleeding.  He is otherwise without fever, chills, shortness of breath, cough, nausea, abdominal pain.  He has been NPO.  He does not take blood thinners.   Past Medical History:  Diagnosis Date   Acute encephalopathy 08/17/2016   Acute renal failure superimposed on stage 3 chronic kidney disease ([place]) 10/15/2018   AKI (acute kidney injury) ([place]) 08/16/2016   Arthritis    Bacteremia due to Enterobacter species 08/19/2016   Carpal tunnel syndrome, bilateral 10/09/2017   Chest tightness 10/15/2018   CLL (chronic lymphocytic leukemia) ([place]) 12/16/2014   Dyspnea 09/20/2012   Elevated troponin 10/16/2018   Enterobacter sepsis ([place]) 08/19/2016   Fall 10/15/2018   GERD (gastroesophageal reflux disease)    Hepatitis 1966   Drug reaction after taking medication   Hyperglycemia    Hyperlipemia    Hypertension    Hypokalemia 08/16/2016   Lung nodule seen on imaging study    bilateral lungs   Nasal bone fracture 10/15/2018   Right foot pain    SAH (subarachnoid hemorrhage) ([place]) 10/15/2018   Syncope 10/15/2018   Thrombocytopenia ([place]) 10/15/2018   Urinary tract infection with hematuria     Past  Surgical History:  Procedure Laterality Date   APPENDECTOMY     arthroscoyp  2000   left knee   COLONOSCOPY W/ POLYPECTOMY     LOOP RECORDER INSERTION N/A 10/18/2018   Procedure: LOOP RECORDER INSERTION;  Surgeon: [name], MD;  Location: [facility];  Service: Cardiovascular;  Laterality: N/A;   LUMBAR LAMINECTOMY/DECOMPRESSION MICRODISCECTOMY  06/27/2012   Procedure: LUMBAR LAMINECTOMY/DECOMPRESSION MICRODISCECTOMY 1 LEVEL;  Surgeon: [name], MD;  Location: [facility];  Service: Neurosurgery;  Laterality: Bilateral;  bilateral four-five laminectony   SKIN CANCER EXCISION  5-6 years ago   moe-s surgery- basal b   TONSILLECTOMY      Allergies: Patient has no known allergies.  Medications: Prior to Admission medications   Medication Sig Start Date End Date Taking? Authorizing Provider  Cholecalciferol (VITAMIN D) 2000 units CAPS Take 2,000 Units by mouth daily.    Yes [provider]  Loratadine 10 MG CAPS Take 10 mg by mouth.   Yes [provider]  polymixin-bacitracin (POLYSPORIN) 500-10000 UNIT/GM OINT ointment Apply 1 application topically 2 (two) times daily. 10/18/18  Yes Domenic Polite, MD  predniSONE (DELTASONE) 20 MG tablet Take 30 mg by mouth daily.   Yes [provider]  simvastatin (ZOCOR) 40 MG tablet Take 20 mg by mouth daily.    Yes [provider]  Tamsulosin HCl (FLOMAX) 0.4 MG CAPS Take 1 capsule (0.4 mg total) by mouth daily. 06/30/12  Yes Jovita Gamma, MD  EPINEPHrine (EPIPEN) 0.3 mg/0.3 mL DEVI Inject 0.3 mLs (0.3 mg total) into the muscle  once. Patient not taking: Reported on 11/05/2018 05/26/12   Rise Mu, PA-C     Family History  Problem Relation Age of Onset   Sudden death Mother 67       possible botulism   Dementia Father    Seizures Sister     Social History   Socioeconomic History   Marital status: Married    Spouse name: [name]    Number of children: 2   Years of  education: PhD   Highest education level: Not on file  Occupational History   Occupation: Retired  Scientist, product/process development strain: Not on file   Food insecurity:    Worry: Not on file    Inability: Not on Lexicographer needs:    Medical: Not on file    Non-medical: Not on file  Tobacco Use   Smoking status: Never Smoker   Smokeless tobacco: Never Used  Substance and Sexual Activity   Alcohol use: Yes    Alcohol/week: 2.0 standard drinks    Types: 2 Cans of beer per week    Comment: rare 2 beers per month   Drug use: No   Sexual activity: Not on file  Lifestyle   Physical activity:    Days per week: Not on file    Minutes per session: Not on file   Stress: Not on file  Relationships   Social connections:    Talks on phone: Not on file    Gets together: Not on file    Attends religious service: Not on file    Active member of club or organization: Not on file    Attends meetings of clubs or organizations: Not on file    Relationship status: Not on file  Other Topics Concern   Not on file  Social History Narrative   Lives with wife   Caffeine use: 1 cup coffee per day   Diet coke      Right handed      Review of Systems: A 12 point ROS discussed and pertinent positives are indicated in the HPI above.  All other systems are negative.  Review of Systems  Constitutional: Positive for fatigue. Negative for fever.  Respiratory: Negative for cough and shortness of breath.   Cardiovascular: Negative for chest pain.  Gastrointestinal: Negative for abdominal pain, nausea and vomiting.  Skin: Positive for wound ( ).  Psychiatric/Behavioral: Negative for behavioral problems and confusion.    Vital Signs: There were no vitals taken for this visit.  Physical Exam Vitals signs and nursing note reviewed.  Constitutional:      Appearance: Normal appearance.  HENT:     Mouth/Throat:     Mouth: Mucous membranes are moist.     Pharynx:  Oropharynx is clear.  Cardiovascular:     Rate and Rhythm: Normal rate and regular rhythm.     Pulses: Normal pulses.     Heart sounds: Normal heart sounds. No murmur.  Pulmonary:     Effort: Pulmonary effort is normal. No respiratory distress.     Breath sounds: Normal breath sounds.  Abdominal:     General: Abdomen is flat.     Palpations: Abdomen is soft.  Skin:    General: Skin is warm and dry.     Comments: several scabbed wounds with signs of slow healing  Neurological:     General: No focal deficit present.     Mental Status: He is alert and oriented to person, place,  and time. Mental status is at baseline.  Psychiatric:        Mood and Affect: Mood normal.        Behavior: Behavior normal.        Thought Content: Thought content normal.        Judgment: Judgment normal.      MD Evaluation Airway: WNL Heart: WNL Abdomen: WNL Chest/ Lungs: WNL ASA  Classification: 3 Mallampati/Airway Score: One   Imaging: Dg Chest 2 View  Result Date: 10/31/2018 CLINICAL DATA:  Shortness of breath over the last 5 days which are worsening. Recent syncopal episodes. History CLL. EXAM: CHEST - 2 VIEW COMPARISON:  Chest CT 10/15/2018 FINDINGS: Artifact overlies the chest. Loop recorder in place. Heart size is normal. Chronic aortic atherosclerosis. The lungs are clear except for some chronic scarring in the lingula. Vascularity is normal. No effusions. 1 cm nodule shown by CT in the left lower lobe cannot be resolved by frontal radiography, blending with some chronic lingular scarring, but is visible on the lateral view. IMPRESSION: No active cardiopulmonary disease. Chronic scarring in the lingula. Known 1 cm left lower lobe nodule. Electronically Signed   By: Nelson Chimes M.D.   On: 10/31/2018 21:24   Ct Head Wo Contrast  Result Date: 10/16/2018 CLINICAL DATA:  Follow-up subarachnoid hemorrhage. EXAM: CT HEAD WITHOUT CONTRAST TECHNIQUE: Contiguous axial images were obtained from the base  of the skull through the vertex without intravenous contrast. COMPARISON:  MRI same day.  CT 10/15/2018 FINDINGS: Brain: No increase in the small amount of post traumatic subarachnoid hemorrhage within a sulcus of the medial right frontal region. No evidence of acute brain injury otherwise. The hemorrhage is becoming less dense. No subdural hematoma. No sign of acute infarction. 13 mm meningioma at the left parietal vertex as seen previously. Vascular: There is atherosclerotic calcification of the major vessels at the base of the brain. Skull: No skull fracture. Comminuted nasal fractures as seen previously. Probable minor fracture of the lamina papyracea/medial orbital wall on the right. Sinuses/Orbits: Mucosal inflammatory changes most notable affecting the right maxillary sinus. Small amount of fluid layering in the right frontal sinus. Probable minor fracture of lamina papyracea on the right. Other: None IMPRESSION: No increase in the small amount of post traumatic subarachnoid hemorrhage on the right. The hemorrhage is becoming less distinct. No development of any other bleeding. Left parietal vertex 13 mm meningioma as seen previously. Comminuted nasal fractures as seen previously. Probable minimal fracture of the medial orbital wall on the right. Electronically Signed   By: Nelson Chimes M.D.   On: 10/16/2018 20:34   Ct Head Wo Contrast  Result Date: 10/15/2018 CLINICAL DATA:  78 year old male status post syncope and blunt trauma. Nose and forehead lacerations. EXAM: CT HEAD WITHOUT CONTRAST CT MAXILLOFACIAL WITHOUT CONTRAST CT CERVICAL SPINE WITHOUT CONTRAST TECHNIQUE: Multidetector CT imaging of the head, cervical spine, and maxillofacial structures were performed using the standard protocol without intravenous contrast. Multiplanar CT image reconstructions of the cervical spine and maxillofacial structures were also generated. COMPARISON:  Paranasal sinus CT 04/06/2011. FINDINGS: CT HEAD FINDINGS Brain:  Trace subarachnoid hemorrhage along the medial aspect of the right superior frontal gyrus on series 3, image 20 and series 5, image 21. No subdural blood or other extra-axial hemorrhage identified. No intraventricular hemorrhage. No parenchymal hemorrhagic contusion identified. Partially calcified left frontoparietal convexity 11-13 millimeter meningioma suspected (series 3, image 26 and coronal image 52). No significant mass effect. No cerebral edema. Gray-white matter differentiation  is within normal limits for age. No ventriculomegaly or intracranial mass effect. No cortically based acute infarct identified. Vascular: Calcified atherosclerosis at the skull base. No suspicious intracranial vascular hyperdensity. Skull: Calvarium appears intact. See facial bone findings below. Other: Forehead scalp hematoma tracks cephalad. See facial bone findings below. Other scalp soft tissues appear negative. CT MAXILLOFACIAL FINDINGS Osseous: Comminuted and impacted bilateral anterior nasal bone fractures with associated nasal bridge soft tissue swelling and soft tissue gas. The maxillary nasal processes remain intact. However, there is mild associated deformity of the anterior superior nasal septum. There is also abnormal soft tissue gas in and around the right medial frontoethmoidal confluence, with a possible nondisplaced fracture there on series 8, image 64. Otherwise the anterior walls of the frontal sinuses appear intact. Nondisplaced fracture versus nutrient foramen of the right mandible in the parasymphyseal region best seen on series 13, image 42. Elsewhere the mandible appears intact and normally aligned, but there is superimposed poor dentition. No maxilla or zygoma fracture. Poor maxillary dentition posteriorly. Central skull base intact. Pterygoid plates intact. Orbits: Trace gas in the anterior superior right orbit suspicious for nondisplaced lamina papyracea fracture there on series 8, image 70 and series 12,  image 33. Other orbital walls are intact. Globes are intact. There is preseptal soft tissue swelling/hematoma, but no intraorbital hematoma. No other intraorbital gas. Sinuses: Trace hemorrhage layering in the right frontal sinus (series 6, image 68) associated with the nondisplaced frontoethmoidal fractures described above. Mild right anterior ethmoid bladder mucosal thickening. Minimal to mild paranasal sinus mucosal thickening elsewhere. Small volume retained secretions in the nasal cavity. Leftward anterior nasal septal deviation may be acute and associated with the nasal bone fractures described earlier. Negative nasal cavity otherwise. Tympanic cavities and mastoids are clear. Soft tissues: Negative visible noncontrast larynx, pharynx, parapharyngeal spaces, retropharyngeal space, sublingual space, submandibular and parotid glands. There is moderate bilateral level 1, level 2, and visible level 3 lymphadenopathy with enlarged individual nodes up to 17 millimeters short axis. CT CERVICAL SPINE FINDINGS Alignment: Mild reversal of cervical lordosis. Cervicothoracic junction alignment is within normal limits. Bilateral posterior element alignment is within normal limits. Skull base and vertebrae: Visualized skull base is intact. No atlanto-occipital dissociation. No acute osseous abnormality identified. Soft tissues and spinal canal: No prevertebral fluid or swelling. No visible canal hematoma. Generalized bilateral cervical lymphadenopathy continuing to the thoracic inlet (series 15, image 64). Disc levels: Congenital incomplete segmentation versus acquired ankylosis of C6-C7. Advanced cervical disc and endplate degeneration elsewhere. At least mild multifactorial spinal stenosis at C4-C5 and C5-C6. Upper chest: Negative lung apices. Grossly intact visible upper thoracic levels. IMPRESSION: 1. Positive for trace posttraumatic subarachnoid hemorrhage along the medial aspect of the right superior frontal gyrus. No  other acute traumatic injury to the brain identified. 2. Positive for generalized cervical Lymphadenopathy continuing to the thoracic inlet. On discussion with Dr. Tyrone Nine in the ED he advises the patient has chronic lymphocytic leukemia (CLL) which explains this appearance. 3. Comminuted and impacted bilateral nasal bone fractures. Probable associated fracture of the anterior superior nasal septum. Nondisplaced fracture of the anterior right frontoethmoidal confluence suspected, with mild regional soft tissue gas. Nondisplaced fracture of the right anterior lamina papyracea with trace gas in the anterior superior right orbit. No intraorbital hematoma. 4. Probable nutrient foramen of the right mandible parasymphysis, less likely a nondisplaced fracture (series 13, image 42). 5. No acute fracture in the cervical spine. Advanced cervical spine degeneration with evidence of multifactorial spinal stenosis at C4-C5  and C5-C6. Study discussed by telephone with Dr. Tyrone Nine on 10/15/2018 at 19:50 . Electronically Signed   By: Genevie Ann M.D.   On: 10/15/2018 19:56   Ct Angio Chest Pe W And/or Wo Contrast  Result Date: 10/15/2018 CLINICAL DATA:  78 year old male status post syncope and blunt trauma. Chest tightness, shortness of breath. CLL. EXAM: CT ANGIOGRAPHY CHEST WITH CONTRAST TECHNIQUE: Multidetector CT imaging of the chest was performed using the standard protocol during bolus administration of intravenous contrast. Multiplanar CT image reconstructions and MIPs were obtained to evaluate the vascular anatomy. CONTRAST:  36m OMNIPAQUE IOHEXOL 350 MG/ML SOLN COMPARISON:  Head face and cervical spine CT today reported separately. Chest radiographs 08/17/2016. Chest CT 03/16/2009. FINDINGS: Cardiovascular: Good contrast bolus timing in the pulmonary arterial tree. Mild respiratory motion. No focal filling defect identified in the pulmonary arteries to suggest acute pulmonary embolism. Negative visible aorta aside from mild  atherosclerosis. Mild cardiomegaly. No pericardial effusion. Mediastinum/Nodes: Lower cervical lymphadenopathy continuing to the thoracic inlet as seen on the face and cervical spine imaging today. There is mild to moderate mediastinal and hilar lymphadenopathy, new since 2010. Individual nodes measure up to 12 millimeter short axis. Lungs/Pleura: Major airways are patent. No pneumothorax or pleural effusion. No pulmonary contusion. Mild chronic scarring in the lingula and medial segment of the right middle lobe. There is a round 10-11 millimeter anterior basal segment left lower lobe lung nodule which was 7 millimeters in 2010. This is most likely benign. Upper Abdomen: Partially visible splenomegaly, new since 2010. Negative visible liver and bowel in the upper abdomen. Musculoskeletal: No rib or sternal fracture identified. Thoracic spine degeneration. No acute osseous abnormality identified. Review of the MIP images confirms the above findings. IMPRESSION: 1. No evidence of acute pulmonary embolus. No acute traumatic injury identified in the chest. 2. History of CLL with evidence of active disease: splenomegaly, cervical and mediastinal lymphadenopathy. 3. No acute pulmonary abnormality. A solitary lung nodule in the left lower lobe has minimally increased from 7 to 11 mm over the past 10 years compatible with benign etiology. 4. Mild cardiomegaly. Electronically Signed   By: HGenevie AnnM.D.   On: 10/15/2018 20:01   Ct Cervical Spine Wo Contrast  Result Date: 10/15/2018 CLINICAL DATA:  78year old male status post syncope and blunt trauma. Nose and forehead lacerations. EXAM: CT HEAD WITHOUT CONTRAST CT MAXILLOFACIAL WITHOUT CONTRAST CT CERVICAL SPINE WITHOUT CONTRAST TECHNIQUE: Multidetector CT imaging of the head, cervical spine, and maxillofacial structures were performed using the standard protocol without intravenous contrast. Multiplanar CT image reconstructions of the cervical spine and maxillofacial  structures were also generated. COMPARISON:  Paranasal sinus CT 04/06/2011. FINDINGS: CT HEAD FINDINGS Brain: Trace subarachnoid hemorrhage along the medial aspect of the right superior frontal gyrus on series 3, image 20 and series 5, image 21. No subdural blood or other extra-axial hemorrhage identified. No intraventricular hemorrhage. No parenchymal hemorrhagic contusion identified. Partially calcified left frontoparietal convexity 11-13 millimeter meningioma suspected (series 3, image 26 and coronal image 52). No significant mass effect. No cerebral edema. Gray-white matter differentiation is within normal limits for age. No ventriculomegaly or intracranial mass effect. No cortically based acute infarct identified. Vascular: Calcified atherosclerosis at the skull base. No suspicious intracranial vascular hyperdensity. Skull: Calvarium appears intact. See facial bone findings below. Other: Forehead scalp hematoma tracks cephalad. See facial bone findings below. Other scalp soft tissues appear negative. CT MAXILLOFACIAL FINDINGS Osseous: Comminuted and impacted bilateral anterior nasal bone fractures with associated nasal bridge soft  tissue swelling and soft tissue gas. The maxillary nasal processes remain intact. However, there is mild associated deformity of the anterior superior nasal septum. There is also abnormal soft tissue gas in and around the right medial frontoethmoidal confluence, with a possible nondisplaced fracture there on series 8, image 64. Otherwise the anterior walls of the frontal sinuses appear intact. Nondisplaced fracture versus nutrient foramen of the right mandible in the parasymphyseal region best seen on series 13, image 42. Elsewhere the mandible appears intact and normally aligned, but there is superimposed poor dentition. No maxilla or zygoma fracture. Poor maxillary dentition posteriorly. Central skull base intact. Pterygoid plates intact. Orbits: Trace gas in the anterior superior  right orbit suspicious for nondisplaced lamina papyracea fracture there on series 8, image 70 and series 12, image 33. Other orbital walls are intact. Globes are intact. There is preseptal soft tissue swelling/hematoma, but no intraorbital hematoma. No other intraorbital gas. Sinuses: Trace hemorrhage layering in the right frontal sinus (series 6, image 68) associated with the nondisplaced frontoethmoidal fractures described above. Mild right anterior ethmoid bladder mucosal thickening. Minimal to mild paranasal sinus mucosal thickening elsewhere. Small volume retained secretions in the nasal cavity. Leftward anterior nasal septal deviation may be acute and associated with the nasal bone fractures described earlier. Negative nasal cavity otherwise. Tympanic cavities and mastoids are clear. Soft tissues: Negative visible noncontrast larynx, pharynx, parapharyngeal spaces, retropharyngeal space, sublingual space, submandibular and parotid glands. There is moderate bilateral level 1, level 2, and visible level 3 lymphadenopathy with enlarged individual nodes up to 17 millimeters short axis. CT CERVICAL SPINE FINDINGS Alignment: Mild reversal of cervical lordosis. Cervicothoracic junction alignment is within normal limits. Bilateral posterior element alignment is within normal limits. Skull base and vertebrae: Visualized skull base is intact. No atlanto-occipital dissociation. No acute osseous abnormality identified. Soft tissues and spinal canal: No prevertebral fluid or swelling. No visible canal hematoma. Generalized bilateral cervical lymphadenopathy continuing to the thoracic inlet (series 15, image 64). Disc levels: Congenital incomplete segmentation versus acquired ankylosis of C6-C7. Advanced cervical disc and endplate degeneration elsewhere. At least mild multifactorial spinal stenosis at C4-C5 and C5-C6. Upper chest: Negative lung apices. Grossly intact visible upper thoracic levels. IMPRESSION: 1. Positive  for trace posttraumatic subarachnoid hemorrhage along the medial aspect of the right superior frontal gyrus. No other acute traumatic injury to the brain identified. 2. Positive for generalized cervical Lymphadenopathy continuing to the thoracic inlet. On discussion with Dr. Tyrone Nine in the ED he advises the patient has chronic lymphocytic leukemia (CLL) which explains this appearance. 3. Comminuted and impacted bilateral nasal bone fractures. Probable associated fracture of the anterior superior nasal septum. Nondisplaced fracture of the anterior right frontoethmoidal confluence suspected, with mild regional soft tissue gas. Nondisplaced fracture of the right anterior lamina papyracea with trace gas in the anterior superior right orbit. No intraorbital hematoma. 4. Probable nutrient foramen of the right mandible parasymphysis, less likely a nondisplaced fracture (series 13, image 42). 5. No acute fracture in the cervical spine. 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 Study discussed by telephone with Dr. Tyrone Nine on 10/15/2018 at 19:50 . Electronically Signed   By: Genevie Ann M.D.   On: 10/15/2018 19:56   Mr Brain Wo Contrast (neuro Protocol)  Result Date: 10/16/2018 CLINICAL DATA:  Initial evaluation for acute syncope, bilateral hand tingling. EXAM: MRI HEAD WITHOUT CONTRAST MRI CERVICAL SPINE WITHOUT CONTRAST TECHNIQUE: Multiplanar, multiecho pulse sequences of the brain and surrounding structures, and  cervical spine, to include the craniocervical junction and cervicothoracic junction, were obtained without intravenous contrast. COMPARISON:  Prior CT from 10/15/2018 FINDINGS: MRI HEAD FINDINGS Brain: Cerebral volume within normal limits. No significant cerebral white matter changes for age. No abnormal foci of restricted diffusion to suggest acute or subacute ischemia. Gray-white matter differentiation maintained. No encephalomalacia to suggest chronic  cortical infarction. Scattered small volume susceptibility artifact overlying the medial aspect of the right superior frontal gyrus compatible with small volume posttraumatic subarachnoid hemorrhage, stable from prior CT. No other new or progressive intracranial hemorrhage. Trace layering blood products noted within the occipital horns of both lateral ventricles, compatible with redistribution. 13 mm meningioma overlies the parasagittal left parietal convexity (series 11, image 20). No associated edema or mass effect. No other mass lesion. No midline shift or mass effect. No hydrocephalus. No extra-axial fluid collection. Pituitary gland suprasellar region normal. Midline structures intact and normal. Vascular: Major intracranial vascular flow voids are well maintained. Skull and upper cervical spine: Craniocervical junction normal. Bone marrow signal intensity within normal limits. Focal 17 mm T1 hypointense lesion present within the right parietal calvarium (series 9, image 6). Additional possible 2 cm lesion posteriorly within the left occipital calvarium (series 9, image 15). Findings are indeterminate. No corresponding osseous lesion seen on prior CT. Soft tissue swelling seen at the forehead, extending inferiorly to involve the nasal bridge and nose, also seen on prior maxillofacial CT. Associated maxillofacial fractures better evaluated on prior exam. Sinuses/Orbits: Globes and orbital soft tissues within normal limits. Probable right orbital fracture better seen on prior CT. Scattered mucosal thickening throughout the ethmoidal air cells and maxillary sinuses. No significant mastoid effusion. Inner ear structures normal. Other: None. MRI CERVICAL SPINE FINDINGS Alignment: Straightening with slight reversal of the normal cervical lordosis, apex at C5-6. Trace anterolisthesis of C4 on C5, likely chronic and facet mediated. No malalignment. Vertebrae: Vertebral body heights maintained without evidence for acute  or chronic fracture. C6 and C7 vertebral bodies are largely ankylosed. Underlying bone marrow signal intensity somewhat diffusely decreased on T1 weighted imaging, suspected to be related history of CLL. No discrete or worrisome osseous lesions. Cord: Signal intensity within the cervical spinal cord is normal. No evidence for traumatic cord injury or myelomalacia. Posterior Fossa, vertebral arteries, paraspinal tissues: Craniocervical junction normal. Paraspinous and prevertebral soft tissues demonstrate no acute finding. No evidence for ligamentous injury. Prominent bilateral cervical adenopathy again noted, compatible with history of CLL. Normal intravascular flow voids seen within the vertebral arteries bilaterally. Disc levels: C2-C3: Mild bilateral uncovertebral and facet hypertrophy. No significant spinal stenosis. Mild right C3 foraminal narrowing. C3-C4: Broad-based posterior disc protrusion indents the ventral thecal sac. Mild facet and ligament flavum hypertrophy. Mild spinal stenosis without cord deformity. Superimposed uncovertebral hypertrophy with resultant moderate right worse than left C4 foraminal stenosis. C4-C5: Mild diffuse disc bulge with uncovertebral hypertrophy. Superimposed facet and ligament flavum hypertrophy. Resultant moderate spinal stenosis without significant cord deformity. Severe left with moderate right C5 foraminal narrowing. C5-C6: Diffuse circumferential disc osteophyte with intervertebral disc space narrowing. Superimposed facet and ligament flavum hypertrophy. Resultant severe spinal stenosis with mild cord flattening. No cord signal changes. Thecal sac measures 7 mm in AP diameter. Severe bilateral C6 foraminal narrowing, right worse than left. C6-C7: C6 and C7 vertebral bodies are partially ankylosed. Associated endplate osseous ridging with uncovertebral hypertrophy. Flattening of the ventral thecal sac without significant spinal stenosis. Moderate right with mild left C7  foraminal stenosis. C7-T1: Mild disc bulge with right-sided uncovertebral  hypertrophy. Mild right-sided facet degeneration. No spinal stenosis. Mild right C8 foraminal stenosis. Visualized upper thoracic spine demonstrates no significant finding. IMPRESSION: MRI HEAD IMPRESSION: 1. Small volume acute posttraumatic subarachnoid hemorrhage involving the medial right frontal lobe, stable from prior CT. No evidence for new or progressive hemorrhage. 2. No other acute intracranial abnormality. 3. Focal 1-2 cm osseous lesions involving the right parietal and occipital calvarium as above, indeterminate. Findings are of uncertain significance, with no definite corresponding osseous lesion seen on prior CT. Correlation with dedicated bone scan suggested for further evaluation. 4. Soft tissue contusion involving the forehead and central face with associated facial fractures, better seen on prior CT. 5. 13 mm meningioma overlying the left parietal convexity without associated edema. MRI CERVICAL SPINE IMPRESSION: 1. No acute traumatic injury or other finding within the cervical spine status post recent syncope and fall. No evidence for spinal cord or ligamentous injury. 2. Moderate multilevel cervical spondylolysis with resultant moderate spinal stenosis at C4-5 and more severe narrowing at C5-6. 3. Multifactorial degenerative changes with resultant multilevel foraminal narrowing as above. Notable findings include moderate bilateral C4 foraminal stenosis, severe left with moderate right C5 foraminal narrowing, severe bilateral C6 foraminal stenosis, with moderate right C7 foraminal narrowing. 4. Prominent bulky cervical adenopathy within the visualized neck, compatible with history of CLL. Electronically Signed   By: Jeannine Boga M.D.   On: 10/16/2018 02:32   Mr Cervical Spine Wo Contrast  Result Date: 10/16/2018 CLINICAL DATA:  Initial evaluation for acute syncope, bilateral hand tingling. EXAM: MRI HEAD WITHOUT  CONTRAST MRI CERVICAL SPINE WITHOUT CONTRAST TECHNIQUE: Multiplanar, multiecho pulse sequences of the brain and surrounding structures, and cervical spine, to include the craniocervical junction and cervicothoracic junction, were obtained without intravenous contrast. COMPARISON:  Prior CT from 10/15/2018 FINDINGS: MRI HEAD FINDINGS Brain: Cerebral volume within normal limits. No significant cerebral white matter changes for age. No abnormal foci of restricted diffusion to suggest acute or subacute ischemia. Gray-white matter differentiation maintained. No encephalomalacia to suggest chronic cortical infarction. Scattered small volume susceptibility artifact overlying the medial aspect of the right superior frontal gyrus compatible with small volume posttraumatic subarachnoid hemorrhage, stable from prior CT. No other new or progressive intracranial hemorrhage. Trace layering blood products noted within the occipital horns of both lateral ventricles, compatible with redistribution. 13 mm meningioma overlies the parasagittal left parietal convexity (series 11, image 20). No associated edema or mass effect. No other mass lesion. No midline shift or mass effect. No hydrocephalus. No extra-axial fluid collection. Pituitary gland suprasellar region normal. Midline structures intact and normal. Vascular: Major intracranial vascular flow voids are well maintained. Skull and upper cervical spine: Craniocervical junction normal. Bone marrow signal intensity within normal limits. Focal 17 mm T1 hypointense lesion present within the right parietal calvarium (series 9, image 6). Additional possible 2 cm lesion posteriorly within the left occipital calvarium (series 9, image 15). Findings are indeterminate. No corresponding osseous lesion seen on prior CT. Soft tissue swelling seen at the forehead, extending inferiorly to involve the nasal bridge and nose, also seen on prior maxillofacial CT. Associated maxillofacial fractures  better evaluated on prior exam. Sinuses/Orbits: Globes and orbital soft tissues within normal limits. Probable right orbital fracture better seen on prior CT. Scattered mucosal thickening throughout the ethmoidal air cells and maxillary sinuses. No significant mastoid effusion. Inner ear structures normal. Other: None. MRI CERVICAL SPINE FINDINGS Alignment: Straightening with slight reversal of the normal cervical lordosis, apex at C5-6. Trace anterolisthesis of C4 on C5,  likely chronic and facet mediated. No malalignment. Vertebrae: Vertebral body heights maintained without evidence for acute or chronic fracture. C6 and C7 vertebral bodies are largely ankylosed. Underlying bone marrow signal intensity somewhat diffusely decreased on T1 weighted imaging, suspected to be related history of CLL. No discrete or worrisome osseous lesions. Cord: Signal intensity within the cervical spinal cord is normal. No evidence for traumatic cord injury or myelomalacia. Posterior Fossa, vertebral arteries, paraspinal tissues: Craniocervical junction normal. Paraspinous and prevertebral soft tissues demonstrate no acute finding. No evidence for ligamentous injury. Prominent bilateral cervical adenopathy again noted, compatible with history of CLL. Normal intravascular flow voids seen within the vertebral arteries bilaterally. Disc levels: C2-C3: Mild bilateral uncovertebral and facet hypertrophy. No significant spinal stenosis. Mild right C3 foraminal narrowing. C3-C4: Broad-based posterior disc protrusion indents the ventral thecal sac. Mild facet and ligament flavum hypertrophy. Mild spinal stenosis without cord deformity. Superimposed uncovertebral hypertrophy with resultant moderate right worse than left C4 foraminal stenosis. C4-C5: Mild diffuse disc bulge with uncovertebral hypertrophy. Superimposed facet and ligament flavum hypertrophy. Resultant moderate spinal stenosis without significant cord deformity. Severe left with  moderate right C5 foraminal narrowing. C5-C6: Diffuse circumferential disc osteophyte with intervertebral disc space narrowing. Superimposed facet and ligament flavum hypertrophy. Resultant severe spinal stenosis with mild cord flattening. No cord signal changes. Thecal sac measures 7 mm in AP diameter. Severe bilateral C6 foraminal narrowing, right worse than left. C6-C7: C6 and C7 vertebral bodies are partially ankylosed. Associated endplate osseous ridging with uncovertebral hypertrophy. Flattening of the ventral thecal sac without significant spinal stenosis. Moderate right with mild left C7 foraminal stenosis. C7-T1: Mild disc bulge with right-sided uncovertebral hypertrophy. Mild right-sided facet degeneration. No spinal stenosis. Mild right C8 foraminal stenosis. Visualized upper thoracic spine demonstrates no significant finding. IMPRESSION: MRI HEAD IMPRESSION: 1. Small volume acute posttraumatic subarachnoid hemorrhage involving the medial right frontal lobe, stable from prior CT. No evidence for new or progressive hemorrhage. 2. No other acute intracranial abnormality. 3. Focal 1-2 cm osseous lesions involving the right parietal and occipital calvarium as above, indeterminate. Findings are of uncertain significance, with no definite corresponding osseous lesion seen on prior CT. Correlation with dedicated bone scan suggested for further evaluation. 4. Soft tissue contusion involving the forehead and central face with associated facial fractures, better seen on prior CT. 5. 13 mm meningioma overlying the left parietal convexity without associated edema. MRI CERVICAL SPINE IMPRESSION: 1. No acute traumatic injury or other finding within the cervical spine status post recent syncope and fall. No evidence for spinal cord or ligamentous injury. 2. Moderate multilevel cervical spondylolysis with resultant moderate spinal stenosis at C4-5 and more severe narrowing at C5-6. 3. Multifactorial degenerative changes  with resultant multilevel foraminal narrowing as above. Notable findings include moderate bilateral C4 foraminal stenosis, severe left with moderate right C5 foraminal narrowing, severe bilateral C6 foraminal stenosis, with moderate right C7 foraminal narrowing. 4. Prominent bulky cervical adenopathy within the visualized neck, compatible with history of CLL. Electronically Signed   By: Jeannine Boga M.D.   On: 10/16/2018 02:32   Ct Coronary Morph W/cta Cor W/score W/ca W/cm &/or Wo/cm  Addendum Date: 10/18/2018   ADDENDUM REPORT: 10/18/2018 16:23 ADDENDUM: CT FFR was done. 1.  Distal RCA occluded. 2.  LAD disease does not appear significant (FFR 0.84 mid LAD). 3.  No significant LCx disease. [name] Electronically Signed   By: Loralie Champagne M.D.   On: 10/18/2018 16:23   Addendum Date: 10/17/2018   ADDENDUM REPORT:  10/17/2018 17:34 CLINICAL DATA:  Chest pain EXAM: Cardiac CTA MEDICATIONS: Sub lingual nitro. 59m x 2 TECHNIQUE: The patient was scanned on a Siemens 1509slice scanner. Gantry rotation speed was 250 msecs. Collimation was 0.6 mm. A 100 kV prospective scan was triggered in the ascending thoracic aorta at 35-75% of the R-R interval. Average HR during the scan was 60 bpm. The 3D data set was interpreted on a dedicated work station using MPR, MIP and VRT modes. A total of 80cc of contrast was used. FINDINGS: [facility]cardiac: [facility] Pulmonary veins drain normally to the left atrium. Calcium Score: 63 Agatston units. Coronary Arteries: Right dominant with no anomalies LM: No plaque or stenosis. LAD system: Calcified plaque in the proximal LAD just distal to D1, suspect mild (<50%) stenosis. Circumflex system: No plaque or stenosis noted. There is a moderate OM1 that is affected by motion artifact. No calcified plaque seen and suspect probably no significant disease. RCA system: Distal RCA with long occluded segment. This may be chronic given flow in PDA  and PLV, likely from collaterals. IMPRESSION: 1. Coronary artery calcium score 63 Agatston units. This places the patient in the 23rd percentile for age and gender, suggesting low risk for future cardiac events. 2. 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 The PDA and PLV fill with contrast, suggesting collaterals. 3.  Mild stenosis proximal LAD. 4. Artifact obscures a portion of OM1 but doubt significant disease. Will send study for FFR. [name] Electronically Signed   By: DLoralie ChampagneM.D.   On: 10/17/2018 17:34   Result Date: 10/18/2018 EXAM: OVER-READ INTERPRETATION  CT CHEST The following report is an over-read performed by radiologist Dr. [facility]ail [facility] [place] 10/17/2018. This over-read does not include interpretation of cardiac or coronary anatomy or pathology. The coronary CTA interpretation by the cardiologist is attached. COMPARISON:  CTA of the chest of 10/15/2018. FINDINGS: Vascular: Aortic atherosclerosis. Tortuous thoracic aorta. No central pulmonary embolism, on this non-dedicated study. Mediastinum/Nodes: Mediastinal and bilateral axillary adenopathy Was detailed on recent CTA. Lungs/Pleura: Tiny bilateral pleural effusions. Mild bibasilar atelectasis. A left lower lobe pulmonary nodule measures 9 mm on image 28/12 and is consistent with a benign etiology based on presence 10 years ago. Upper Abdomen: Splenomegaly. Normal imaged portions of the liver, stomach, right adrenal gland. Musculoskeletal: Moderate thoracic spondylosis. IMPRESSION: 1.  No acute findings in the imaged extracardiac chest. 2. Chronic lymphocytic leukemia with splenomegaly and thoracic adenopathy, as on recent CTA chest. 3. Benign left lower lobe pulmonary nodule. 4. Development of trace bilateral pleural fluid and dependent bibasilar atelectasis. Electronically Signed: By: KAbigail MiyamotoM.D. On: 10/17/2018 17:03   Ct Maxillofacial Wo Cm  Result Date: 10/15/2018 CLINICAL DATA:  78year old male  status post syncope and blunt trauma. Nose and forehead lacerations. EXAM: CT HEAD WITHOUT CONTRAST CT MAXILLOFACIAL WITHOUT CONTRAST CT CERVICAL SPINE WITHOUT CONTRAST TECHNIQUE: Multidetector CT imaging of the head, cervical spine, and maxillofacial structures were performed using the standard protocol without intravenous contrast. Multiplanar CT image reconstructions of the cervical spine and maxillofacial structures were also generated. COMPARISON:  Paranasal sinus CT 04/06/2011. FINDINGS: CT HEAD FINDINGS Brain: Trace subarachnoid hemorrhage along the medial aspect of the right superior frontal gyrus on series 3, image 20 and series 5, image 21. No subdural blood or other extra-axial hemorrhage identified. No intraventricular hemorrhage. No parenchymal hemorrhagic contusion identified. Partially calcified left frontoparietal convexity 11-13 millimeter meningioma suspected (series 3, image 26 and coronal image  52). No significant mass effect. No cerebral edema. Gray-white matter differentiation is within normal limits for age. No ventriculomegaly or intracranial mass effect. No cortically based acute infarct identified. Vascular: Calcified atherosclerosis at the skull base. No suspicious intracranial vascular hyperdensity. Skull: Calvarium appears intact. See facial bone findings below. Other: Forehead scalp hematoma tracks cephalad. See facial bone findings below. Other scalp soft tissues appear negative. CT MAXILLOFACIAL FINDINGS Osseous: Comminuted and impacted bilateral anterior nasal bone fractures with associated nasal bridge soft tissue swelling and soft tissue gas. The maxillary nasal processes remain intact. However, there is mild associated deformity of the anterior superior nasal septum. There is also abnormal soft tissue gas in and around the right medial frontoethmoidal confluence, with a possible nondisplaced fracture there on series 8, image 64. Otherwise the anterior walls of the frontal sinuses  appear intact. Nondisplaced fracture versus nutrient foramen of the right mandible in the parasymphyseal region best seen on series 13, image 42. Elsewhere the mandible appears intact and normally aligned, but there is superimposed poor dentition. No maxilla or zygoma fracture. Poor maxillary dentition posteriorly. Central skull base intact. Pterygoid plates intact. Orbits: Trace gas in the anterior superior right orbit suspicious for nondisplaced lamina papyracea fracture there on series 8, image 70 and series 12, image 33. Other orbital walls are intact. Globes are intact. There is preseptal soft tissue swelling/hematoma, but no intraorbital hematoma. No other intraorbital gas. Sinuses: Trace hemorrhage layering in the right frontal sinus (series 6, image 68) associated with the nondisplaced frontoethmoidal fractures described above. Mild right anterior ethmoid bladder mucosal thickening. Minimal to mild paranasal sinus mucosal thickening elsewhere. Small volume retained secretions in the nasal cavity. Leftward anterior nasal septal deviation may be acute and associated with the nasal bone fractures described earlier. Negative nasal cavity otherwise. Tympanic cavities and mastoids are clear. Soft tissues: Negative visible noncontrast larynx, pharynx, parapharyngeal spaces, retropharyngeal space, sublingual space, submandibular and parotid glands. There is moderate bilateral level 1, level 2, and visible level 3 lymphadenopathy with enlarged individual nodes up to 17 millimeters short axis. CT CERVICAL SPINE FINDINGS Alignment: Mild reversal of cervical lordosis. Cervicothoracic junction alignment is within normal limits. Bilateral posterior element alignment is within normal limits. Skull base and vertebrae: Visualized skull base is intact. No atlanto-occipital dissociation. No acute osseous abnormality identified. Soft tissues and spinal canal: No prevertebral fluid or swelling. No visible canal hematoma.  Generalized bilateral cervical lymphadenopathy continuing to the thoracic inlet (series 15, image 64). Disc levels: Congenital incomplete segmentation versus acquired ankylosis of C6-C7. Advanced cervical disc and endplate degeneration elsewhere. At least mild multifactorial spinal stenosis at C4-C5 and C5-C6. Upper chest: Negative lung apices. Grossly intact visible upper thoracic levels. IMPRESSION: 1. Positive for trace posttraumatic subarachnoid hemorrhage along the medial aspect of the right superior frontal gyrus. No other acute traumatic injury to the brain identified. 2. Positive for generalized cervical Lymphadenopathy continuing to the thoracic inlet. On discussion with Dr. Tyrone Nine in the ED he advises the patient has chronic lymphocytic leukemia (CLL) which explains this appearance. 3. Comminuted and impacted bilateral nasal bone fractures. Probable associated fracture of the anterior superior nasal septum. Nondisplaced fracture of the anterior right frontoethmoidal confluence suspected, with mild regional soft tissue gas. Nondisplaced fracture of the right anterior lamina papyracea with trace gas in the anterior superior right orbit. No intraorbital hematoma. 4. Probable nutrient foramen of the right mandible parasymphysis, less likely a nondisplaced fracture (series 13, image 42). 5. No acute fracture in the cervical spine. Advanced  cervical spine degeneration with evidence of multifactorial spinal stenosis at C4-C5 and C5-C6. Study discussed by telephone with Dr. Tyrone Nine on 10/15/2018 at 19:50 . Electronically Signed   By: Genevie Ann M.D.   On: 10/15/2018 19:56    Labs:  CBC: Recent Labs    10/29/18 0903 10/31/18 2028 11/05/18 0930 11/09/18 0733  WBC 111.4* 114.0* 119.0* 109.0*  HGB 13.3 13.4 13.9 14.0  HCT 43.9 42.5 46.4 45.9  PLT 51* 44* 31* 26*    COAGS: Recent Labs    11/09/18 0733  INR 0.9    BMP: Recent Labs    10/18/18 0544 10/29/18 0903 10/31/18 2028 11/09/18 0733  NA  139 138 136 139  K 4.1 4.4 5.4* 4.3  CL 107 106 104 108  CO2 23 24 20* 23  GLUCOSE 134* 97 138* 85  BUN 19 28* 31* 33*  CALCIUM 8.4* 9.1 9.5 8.8*  CREATININE 1.45* 1.60* 1.92* 1.69*  GFRNONAA 46* 41* 33* 38*  GFRAA 53* 47* 38* 44*    LIVER FUNCTION TESTS: No results for input(s): BILITOT, AST, ALT, ALKPHOS, PROT, ALBUMIN in the last 8760 hours.  TUMOR MARKERS: No results for input(s): AFPTM, CEA, CA199, CHROMGRNA in the last 8760 hours.  Assessment and Plan: Patient with past medical history of CLL presents with complaint of worsening thrombocytopenia not responsive to prednisone.  IR consulted for bone marrow biopsy at the request of [name]. Case reviewed by Dr. Vernard Gambles who approves patient for procedure.  Patient presents today in their usual state of health. Some fresh, scabbed wounds on face/head from recent fall as well as biopsy.  Also with loop recorder today after recent fall.  He has been NPO and is not currently on blood thinners.   Risks and benefits of biopsy was discussed with the patient and/or patient's family including, but not limited to bleeding, infection, damage to adjacent structures or low yield requiring additional tests.  All of the questions were answered and there is agreement to proceed.  Consent signed and in chart.  Thank you for this interesting consult.  I greatly enjoyed meeting AUBERT CHOYCE and look forward to participating in their care.  A copy of this report was sent to the requesting provider on this date.  Electronically Signed: Docia Barrier, PA 11/09/2018, 8:29 AM   I spent a total of  30 Minutes   in face to face in clinical consultation, greater than 50% of which was counseling/coordinating care for CLL, thrombocytopenia.

## 2018-11-13 ENCOUNTER — Telehealth: Payer: Self-pay | Admitting: Pharmacist

## 2018-11-13 ENCOUNTER — Other Ambulatory Visit: Payer: Self-pay

## 2018-11-13 ENCOUNTER — Telehealth: Payer: Self-pay

## 2018-11-13 ENCOUNTER — Inpatient Hospital Stay: Payer: Medicare Other | Admitting: Oncology

## 2018-11-13 ENCOUNTER — Inpatient Hospital Stay: Payer: Medicare Other

## 2018-11-13 VITALS — BP 154/86 | HR 98 | Temp 98.2°F | Resp 18 | Ht 63.0 in | Wt 149.6 lb

## 2018-11-13 DIAGNOSIS — C9110 Chronic lymphocytic leukemia of B-cell type not having achieved remission: Secondary | ICD-10-CM | POA: Diagnosis not present

## 2018-11-13 DIAGNOSIS — Z79899 Other long term (current) drug therapy: Secondary | ICD-10-CM

## 2018-11-13 DIAGNOSIS — D696 Thrombocytopenia, unspecified: Secondary | ICD-10-CM

## 2018-11-13 DIAGNOSIS — Z7189 Other specified counseling: Secondary | ICD-10-CM | POA: Insufficient documentation

## 2018-11-13 LAB — CMP (CANCER CENTER ONLY)
ALT: 19 U/L (ref 0–44)
AST: 12 U/L — ABNORMAL LOW (ref 15–41)
Albumin: 4.4 g/dL (ref 3.5–5.0)
Alkaline Phosphatase: 78 U/L (ref 38–126)
Anion gap: 8 (ref 5–15)
BUN: 25 mg/dL — ABNORMAL HIGH (ref 8–23)
CO2: 24 mmol/L (ref 22–32)
Calcium: 9.2 mg/dL (ref 8.9–10.3)
Chloride: 104 mmol/L (ref 98–111)
Creatinine: 1.49 mg/dL — ABNORMAL HIGH (ref 0.61–1.24)
GFR, Est AFR Am: 52 mL/min — ABNORMAL LOW (ref >60)
GFR, Estimated: 45 mL/min — ABNORMAL LOW (ref >60)
Glucose, Bld: 121 mg/dL — ABNORMAL HIGH (ref 70–99)
Potassium: 4.7 mmol/L (ref 3.5–5.1)
Sodium: 136 mmol/L (ref 135–145)
Total Bilirubin: 0.4 mg/dL (ref 0.3–1.2)
Total Protein: 6.7 g/dL (ref 6.5–8.1)

## 2018-11-13 LAB — CBC WITH DIFFERENTIAL (CANCER CENTER ONLY)
Abs Immature Granulocytes: 0.49 10*3/uL — ABNORMAL HIGH (ref 0.00–0.07)
Basophils Absolute: 0.1 10*3/uL (ref 0.0–0.1)
Basophils Relative: 0 %
Eosinophils Absolute: 0 10*3/uL (ref 0.0–0.5)
Eosinophils Relative: 0 %
HCT: 45.1 % (ref 39.0–52.0)
Hemoglobin: 13.8 g/dL (ref 13.0–17.0)
Immature Granulocytes: 1 %
Lymphocytes Relative: 85 %
Lymphs Abs: 90.5 10*3/uL — ABNORMAL HIGH (ref 0.7–4.0)
MCH: 29.7 pg (ref 26.0–34.0)
MCHC: 30.6 g/dL (ref 30.0–36.0)
MCV: 97.2 fL (ref 80.0–100.0)
Monocytes Absolute: 1.5 10*3/uL — ABNORMAL HIGH (ref 0.1–1.0)
Monocytes Relative: 1 %
Neutro Abs: 13.3 10*3/uL — ABNORMAL HIGH (ref 1.7–7.7)
Neutrophils Relative %: 13 %
Platelet Count: 34 10*3/uL — ABNORMAL LOW (ref 150–400)
RBC: 4.64 MIL/uL (ref 4.22–5.81)
RDW: 13.9 % (ref 11.5–15.5)
WBC Count: 105.8 10*3/uL (ref 4.0–10.5)
nRBC: 0 % (ref 0.0–0.2)

## 2018-11-13 NOTE — Patient Instructions (Signed)
Provided patient with his lab results printed and handouts on Rituxan and Bendamustine. Informed him that chemo education nurse will be calling for his education session on phone.

## 2018-11-13 NOTE — Telephone Encounter (Signed)
Oral Oncology Patient Advocate Encounter  Prior Authorization for [facility] has been approved.    PA# [id number] Effective dates: 11/13/18 through 06/20/19  Oral Oncology Clinic will continue to follow.   [facility] Phone [phone] Fax [phone] 11/13/2018    2:05 PM

## 2018-11-13 NOTE — Progress Notes (Signed)
START OFF PATHWAY REGIMEN - Lymphoma and CLL   [id number]:Bendamustine + Rituximab (100/375) q21 Days:   A cycle is every 21 days:     Bendamustine      Rituximab-xxxx   **Always confirm dose/schedule in your pharmacy ordering system**  Patient Characteristics: Chronic Lymphocytic Leukemia (CLL), First Line, Treatment Indicated, 17p del(-)/Unknown and ATM Mutation Negative/Unknown, Age ? 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 76 Patient Condition: Fit Patient Intent of Therapy: Non-Curative / Palliative Intent, Discussed with Patient

## 2018-11-13 NOTE — Telephone Encounter (Signed)
Oral Oncology Pharmacist Encounter  Received new referral for Calquence (acalabrutinib) for the treatment of chronic lymphocytic leukemia, planned duration until disease progression or unacceptable toxicity.  Patient has been on active surveillance since diagnosis. He is now experiencing thrombocytopenia due to his CLL as well as markedly elevated lymphocyte count. He is under evaluation to initiate systemic treatment with Calquence.  [name] is not an option at this time due to his progressive thrombocytopenia.  Patient is planned to initiate Calquence at 100 mg once daily until platelet count improves.  Plan is to increase Calquence to 100 mg by mouth twice daily (this is the target dose of Calquence) once toleration is established.  Labs from 11/13/2018 assessed, okay for treatment initiation. Platelet count today = 34k, will be monitored closely during treatment SCr=1.49, est CrCl ~ 39 mL/min, no dose reduction per manufacturer with decreased renal function, renal excretion is noted to be minimal  Current medication list in Epic reviewed, no DDIs with Calquence identified.  Prescription will be e-scribed to the [facility] for benefits analysis and approval once received from MD.  Oral Oncology Clinic will continue to follow for insurance authorization, copayment issues, initial counseling and start date.  [name], PharmD, BCPS, BCOP  11/13/2018 1:24 PM Oral Oncology Clinic [phone]

## 2018-11-13 NOTE — Telephone Encounter (Signed)
Oral Oncology Patient Advocate Encounter  Received notification from [employer] that prior authorization for Calquence is required.  PA submitted on CoverMyMeds Key [id number] Status is pending  Oral Oncology Clinic will continue to follow.  [place] [facility] [place] Phone [phone] Fax [phone] 11/13/2018    2:01 PM

## 2018-11-13 NOTE — Progress Notes (Signed)
[place] OFFICE PROGRESS NOTE   Diagnosis: CLL, thrombocytopenia  INTERVAL HISTORY:   [name] returns for a scheduled visit.  He underwent a bone marrow biopsy 11/09/2018.  He reports tolerated procedure well.  No bleeding or fever.  He has a good appetite, but is concerned that he has lost weight.  No change in the palpable lymph nodes.  No recurrent fall.  Objective:  Vital signs in last 24 hours:  Blood pressure (!) 154/86, pulse 98, temperature 98.2 F (36.8 C), temperature source Oral, resp. rate 18, height 5' 3"  (1.6 m), weight 149 lb 9.6 oz (67.9 kg), SpO2 99 %.    HEENT: No thrush or bleeding Lymphatics: 1-2 cm bilateral axillary and inguinal nodes Resp: Lungs clear bilaterally Cardio: Regular rate and rhythm GI: Nontender, no hepatosplenomegaly Vascular: No leg edema  Skin: Resolving ecchymoses of the left anterior chest wall and face  Portacath/PICC-without erythema  Lab Results:  Lab Results  Component Value Date   WBC 105.8 (HH) 11/13/2018   HGB 13.8 11/13/2018   HCT 45.1 11/13/2018   MCV 97.2 11/13/2018   PLT 34 (L) 11/13/2018   NEUTROABS 13.3 (H) 11/13/2018    CMP  Lab Results  Component Value Date   NA 136 11/13/2018   K 4.7 11/13/2018   CL 104 11/13/2018   CO2 24 11/13/2018   GLUCOSE 121 (H) 11/13/2018   BUN 25 (H) 11/13/2018   CREATININE 1.49 (H) 11/13/2018   CALCIUM 9.2 11/13/2018   PROT 6.7 11/13/2018   ALBUMIN 4.4 11/13/2018   AST 12 (L) 11/13/2018   ALT 19 11/13/2018   ALKPHOS 78 11/13/2018   BILITOT 0.4 11/13/2018   GFRNONAA 45 (L) 11/13/2018   GFRAA 52 (L) 11/13/2018    No results found for: CEA1  Lab Results  Component Value Date   INR 0.9 11/09/2018     Medications: I have reviewed the patient's current medications.   Assessment/Plan: 1. CLL ? Review of the peripheral blood smear is consistent with chronic lymphocytic leukemia ? 12/16/2014 Peripheral blood flow cytometry consistent with CLL ?  10/16/2018 MRI brain and cervical spine - Prominent bulky cervical adenopathy within the visualized neck,compatible with history of CLL. 2.thrombocytopenia-likely secondary to chronic lymphocytic leukemia versus "pseudo thrombocytopenia"due to platelet clumping; progressive thrombocytopenia June 2018  Trial of prednisone started 10/17/2018 after admission with a fall/subarachnoid hemorrhage-starting dose 60 mg daily  Prednisone tapered to 40 mg daily beginning 10/22/2018  Prednisone taper to 30 mg daily beginning 10/29/2018, tapered to off over 6 days beginning 11/13/2018  Bone marrow biopsy 11/09/2018 3.Hospitalization with sepsis/UTI with bacteremia (Enterobacter aerogenes)March 2018 4. Hospitalization for syncopal episode resulting in fall. Developed a small right subarachnoid hemorrhage.  5. MRI brain 10/16/2018 - Focal 1-2 cm osseous lesions involving the right parietal and occipital calvarium as above, indeterminate. Findings are of uncertain significance, with no definite corresponding osseous lesion seen on prior CT.    Disposition: Mr. Gilkey appears unchanged.  He has persistent thrombocytopenia.  The small palpable lymph nodes are unchanged on exam today.  He will taper the prednisone to 20 mg daily for 3 days, then 10 mg daily for 3 days, then off.  I discussed treatment options and Mr. Phineas Real.  I discussed the case with his wife by telephone.  The final bone marrow biopsy report is not available, but I communicated with the hematopathologist.  He indicates the bone marrow is extensively involved with CLL.  Megakaryocytes are present.  The thrombocytopenia is likely  secondary to CLL involving the bone marrow.  We discussed acalabrutinib and bendamustine/rituximab.  I reviewed potential toxicities associated with each of these regimens.  Mr. Sherk and his wife are more comfortable with the bendamustine/rituximab regimen.  We reviewed specific toxicities associated with  bendamustine/rituximab including the chance for nausea, diarrhea, rash, pneumonitis, and hematologic toxicity.  We discussed the allergic reaction, reactivation of hepatitis, atypical infections, pneumonitis, and CNS toxicity seen with rituximab.  He agrees to proceed.  He will attend a chemotherapy teaching class.  Mr. Koury understands the chance of developing hematologic toxicity with a baseline low platelet count and significant bone marrow involvement with CLL.  The bendamustine will be dose reduced with cycle 1.  The plan is to initiate bendamustine/rituximab on 11/21/2018.  He will return for an office visit and nadir CBC on 11/30/2018.  40 minutes were spent with the patient today.  The majority of the time was used for counseling and coordination of care.  Betsy Coder, MD  11/13/2018  1:19 PM

## 2018-11-14 ENCOUNTER — Telehealth: Payer: Self-pay | Admitting: Oncology

## 2018-11-14 LAB — HEPATITIS B SURFACE ANTIGEN: Hepatitis B Surface Ag: NEGATIVE

## 2018-11-14 LAB — HEPATITIS B CORE ANTIBODY, TOTAL: Hep B Core Total Ab: NEGATIVE

## 2018-11-14 NOTE — Telephone Encounter (Signed)
Oral Oncology Pharmacist Encounter  Received notification from MD that patient has decided to receive bendamustine and rituximab therapy for his CLL.  Referral for Calquence has been cancelled.  No further needs from [facility] identified at this time. Oral Oncology Clinic will sign off. Please let us know if we can be of assistance in the future.  [name], PharmD, BCPS, BCOP  11/14/2018 9:00 AM Oral Oncology Clinic [phone]

## 2018-11-14 NOTE — Telephone Encounter (Signed)
Scheduled appts per 5/26 los.  Added treatment to the book for approval.  The appts I was able to schedule, I spoke with the patient and the patient is aware of the appt dates and time.

## 2018-11-15 ENCOUNTER — Telehealth: Payer: Self-pay | Admitting: Oncology

## 2018-11-15 ENCOUNTER — Encounter (HOSPITAL_COMMUNITY): Payer: Self-pay | Admitting: Oncology

## 2018-11-15 ENCOUNTER — Telehealth: Payer: Self-pay | Admitting: *Deleted

## 2018-11-15 NOTE — Telephone Encounter (Signed)
Patient called saying he thinks we are rushing into treatment too soon. He is feeling very rushed. Wants to discuss with MD several things before his chemo education session on 11/20/18. 1.Wants to discuss the acalabrutinib and side effects, cost and effectiveness. 2.What are chances of remission with both therapies and how long it could last? 3. Concerned about his kidney functions and management. 4.What happens if my platelets go down? 5.Is his shortness of breath related to his CLL? His daughter, [name] called few minutes later requesting to speak with [name] in next couple days at [phone].

## 2018-11-15 NOTE — Telephone Encounter (Signed)
Informed patient that MD will have a telephone visit with him tomorrow at 11:00 if he wishes. Patient agrees and appreciates the appointment.

## 2018-11-15 NOTE — Telephone Encounter (Signed)
Called to inform patient that his treatment has been added.  Patient aware of his appt date and time.

## 2018-11-16 ENCOUNTER — Inpatient Hospital Stay (HOSPITAL_BASED_OUTPATIENT_CLINIC_OR_DEPARTMENT_OTHER): Payer: Medicare Other | Admitting: Oncology

## 2018-11-16 DIAGNOSIS — C9110 Chronic lymphocytic leukemia of B-cell type not having achieved remission: Secondary | ICD-10-CM

## 2018-11-16 DIAGNOSIS — D696 Thrombocytopenia, unspecified: Secondary | ICD-10-CM

## 2018-11-16 NOTE — Progress Notes (Addendum)
[place] [facility] OFFICE VISIT PROGRESS NOTE  I connected with [name] on 11/16/18 at 11:00 AM EDT by telephone and verified that I am speaking with the correct person using two identifiers  Other persons participating in the visit and their role in the encounter: Wife  Patient's location: Home Provider's location: Office   Diagnosis: CLL  INTERVAL HISTORY:   [name] requested a telephone visit today.  I saw him earlier this week to discuss treatment options for the CLL.  We discussed acalabrutinib and bendamustine/rituximab. The final bone marrow report confirms extensive involvement with CLL.  He denies bleeding.  No recurrent fall.  No fever.  Dyspnea has improved. He reports being diagnosed with a basal cell carcinoma at the temple last week.  Mohs surgery has been recommended.   Lab Results:  Lab Results  Component Value Date   WBC 105.8 (HH) 11/13/2018   HGB 13.8 11/13/2018   HCT 45.1 11/13/2018   MCV 97.2 11/13/2018   PLT 34 (L) 11/13/2018   NEUTROABS 13.3 (H) 11/13/2018     Medications: I have reviewed the patient's current medications.  Assessment/Plan: 1. CLL ? Review of the peripheral blood smear is consistent with chronic lymphocytic leukemia ? 12/16/2014 Peripheral blood flow cytometry consistent with CLL ? 10/16/2018 MRI brain and cervical spine - Prominent bulky cervical adenopathy within the visualized neck,compatible with history of CLL. 2.thrombocytopenia-likely secondary to chronic lymphocytic leukemia versus "pseudo thrombocytopenia"due to platelet clumping; progressive thrombocytopenia June 2018  Trial of prednisone started 10/17/2018 after admission with a fall/subarachnoid hemorrhage-starting dose 60 mg daily  Prednisone tapered to 40 mg daily beginning 10/22/2018  Prednisone taper to 30 mg daily beginning 10/29/2018, tapered to off over 6 days beginning 11/13/2018  Bone marrow biopsy  11/09/2018-hypercellular marrow with prominent involvement by CLL, abundant megakaryocytes, lymphocytes represent 82% of all cells, the cytogenetics returned normal 3.Hospitalization with sepsis/UTI with bacteremia (Enterobacter aerogenes)March 2018 4. Hospitalization for syncopal episode resulting in fall. Developed a small right subarachnoid hemorrhage.  5. MRI brain 10/16/2018 - Focal 1-2 cm osseous lesions involving the right parietal and occipital calvarium as above, indeterminate. Findings are of uncertain significance, with no definite corresponding osseous lesion seen on prior CT. 6.  History of idiopathic "anaphylactoid "reactions    Disposition: [name] has CLL with associated thrombocytopenia.  We discussed the indication for treating the CLL.  The thrombocytopenia and recent weight loss are indications for treatment. He understands the thrombocytopenia may be in part immune related, but there was not significant improvement with prednisone.  If he has ITP it is associated with CLL.  I recommend treating the CLL.  We discussed acalabrutinib and bendamustine/rituximab again today.  I reviewed potential toxicities associated with each of these agents.  [name] remains undecided on which regimen he would like to receive.  He will contact us with his decision on 11/19/2018.  He understands the increased risk of severe thrombocytopenia and bleeding with either treatment.  He requested I contact his daughter to discuss the case.   I discussed the assessment and treatment plan with the patient. The patient was provided an opportunity to ask questions and all were answered. The patient agreed with the plan and demonstrated an understanding of the instructions.   The patient was advised to call back or seek an in-person evaluation if the symptoms worsen or if the condition fails to improve as anticipated.  I provided 25 minutes of telephone, chart review, and documentation time  during this encounter, and > 50% was spent counseling as documented under my assessment & plan.  Betsy Coder ANP/GNP-BC   11/16/2018 11:12 AM Addendum: I discussed the case with his physician daughter.  We discussed the treatment options and potential toxicities associated with the above agents.  She will talk with him further over the weekend and they will let us know his decision on treatment next week.  She relates he has history of "anaphylactoid "reactions in the remote past.  She reports he underwent an allergy work-up that revealed no specific trigger.  He takes Claritin daily and has an EpiPen in the event of repeat reactions.

## 2018-11-19 ENCOUNTER — Telehealth: Payer: Self-pay | Admitting: *Deleted

## 2018-11-19 ENCOUNTER — Telehealth: Payer: Self-pay

## 2018-11-19 ENCOUNTER — Other Ambulatory Visit: Payer: Medicare Other

## 2018-11-19 ENCOUNTER — Ambulatory Visit: Payer: Medicare Other | Admitting: Nurse Practitioner

## 2018-11-19 ENCOUNTER — Telehealth: Payer: Self-pay | Admitting: Pharmacist

## 2018-11-19 ENCOUNTER — Telehealth: Payer: Self-pay | Admitting: Oncology

## 2018-11-19 DIAGNOSIS — C9110 Chronic lymphocytic leukemia of B-cell type not having achieved remission: Secondary | ICD-10-CM

## 2018-11-19 MED ORDER — ACALABRUTINIB 100 MG PO CAPS: 100.0000 mg | ORAL_CAPSULE | Freq: Every day | ORAL | 0 refills | Status: DC

## 2018-11-19 NOTE — Telephone Encounter (Signed)
Oral Oncology Pharmacist Encounter  Received new referral for Calquence (acalabrutinib) for the treatment of chronic lymphocytic leukemia, planned duration until disease progression or unacceptable toxicity.  Patient has been on active surveillance since diagnosis. He is now experiencing thrombocytopenia due to his CLL as well as markedly elevated lymphocyte count.  He was under evaluation to initiate systemic treatment with Calquence or bendamustine and rituximab.  [name] is not an option at this time due to his progressive thrombocytopenia and possible cardiac history.  Patient was planned to initiate Calquence at 100 mg once daily until platelet count improves with plan is to increase Calquence to 100 mg by mouth twice daily (this is the target dose of Calquence) once toleration is established.  On 11/14/2018 Calquence referral was stopped as patient had decided to receive IV chemotherapy.  Patient has now decided to resume Calquence referral.  Labs from 11/13/2018 assessed, okay for treatment initiation. Platelet count today = 34k, will be monitored closely during treatment SCr=1.49, est CrCl ~ 39 mL/min, no dose reduction per manufacturer with decreased renal function, renal excretion is noted to be minimal  Current medication list in Epic reviewed, no DDIs with Calquence identified.  Prescription  for Calquence 100mg  once daily has been e-scribed to the [employer] for benefits analysis and approval.  Insurance authorization for [employer] was approved on 11/13/2018.  Oral Oncology Clinic will continue to follow for copayment issues, initial counseling and start date.  [name], PharmD, BCPS, BCOP  11/19/2018  12:00 PM  Oral Oncology Clinic [phone]

## 2018-11-19 NOTE — Telephone Encounter (Signed)
Pt called to inform [name] re:  Pt has decided to NOT do infusion on Wed and Thurs this week.  Pt has opted to take oral chemo pill daily as discussed by MD. Pt's   Phone    [phone]

## 2018-11-19 NOTE — Telephone Encounter (Signed)
Per 5/29 los Follow-up as scheduled

## 2018-11-19 NOTE — Telephone Encounter (Signed)
Spoke with patient. He reports bruising continues to improve, now mostly yellow and about the size of a silver dollar (previously the diameter of an orange). Denies drainage, redness, fever/chills. Slight bump at site, has some "give" so pt is unsure if he is palpating ILR. Feels it may be a little more prominent for the past 10 days or so. Doesn't think he is able to send picture via MyChart (flip phone).  Oncology note from 11/16/18 mentions thrombocytopenia, CLL. Advised pt that I will reach out to Dr. Curt Bears for recommendations. He is agreeable to plan.

## 2018-11-19 NOTE — Telephone Encounter (Signed)
Oral Chemotherapy Pharmacist Encounter   I spoke with patient and wife, [name], for overview of: Calquence (acalabrutinib)for the treatment of chronic lymphocytic leukemia, planned durationuntil disease progression or unacceptable toxicity.  Counseled patient on administration, dosing, side effects, monitoring, drug-food interactions, safe handling, storage, and disposal.  Calquence will be initiated with 100mg  capsules to take 1 capsule by mouth once daily, with or with out food, with a glass of water.  Calquence will be increased to target dosing of 100mg  by mouth approximately 12 hours apart, with or with out food, with a glass of water, based on toleration and blood counts.  Patient knows to avoid acid suppressing agents, if needed, may seperate use of an H2RA blocker or antacid by 2 hours from Calquence admisinstration.  Calquence start date: 11/22/2018  Adverse effects include but are not limited to: headache, diarrhea, fatigue, rash, muscle pain, bruising, decreased blood counts, and altered cardiac conduction.    Reviewed importance of keeping a medication schedule and plan for any missed doses.  Medication reconciliation performed and medication/allergy list updated.  Insurance authorization for [employer] has been obtained. Test claim at the pharmacy revealed copayment $100 for 1st fill of Calquence. This will ship from the [facility] on 11/20/2018 to deliver to patient's home on 11/21/2018.  Patient informed the pharmacy will reach out 5-7 days prior to needing next fill of Calquence to coordinate continued medication acquisition to prevent break in therapy.  All questions answered.  Mr. Selvidge voiced understanding and appreciation.   They know to call the office with questions or concerns.  [name], PharmD, BCPS, BCOP  11/19/2018   3:11 PM Oral Oncology Clinic [phone]

## 2018-11-19 NOTE — Telephone Encounter (Signed)
-  Pt states he is concerned about his ILR incision site is still having some bruising. Pt states the bruised have went down in size a lot but still a small purple, yellowish bruise on his chest. The pt also states he can feel his ILR protruding some. The pt would like the nurse to give him a call. The best number to reach the pt is [phone].

## 2018-11-20 ENCOUNTER — Ambulatory Visit (INDEPENDENT_AMBULATORY_CARE_PROVIDER_SITE_OTHER): Payer: Medicare Other | Admitting: *Deleted

## 2018-11-20 ENCOUNTER — Telehealth: Payer: Self-pay | Admitting: *Deleted

## 2018-11-20 ENCOUNTER — Inpatient Hospital Stay: Payer: Medicare Other | Attending: Oncology

## 2018-11-20 ENCOUNTER — Telehealth: Payer: Self-pay | Admitting: Cardiology

## 2018-11-20 DIAGNOSIS — C9110 Chronic lymphocytic leukemia of B-cell type not having achieved remission: Secondary | ICD-10-CM | POA: Insufficient documentation

## 2018-11-20 DIAGNOSIS — R55 Syncope and collapse: Secondary | ICD-10-CM

## 2018-11-20 DIAGNOSIS — D696 Thrombocytopenia, unspecified: Secondary | ICD-10-CM | POA: Insufficient documentation

## 2018-11-20 DIAGNOSIS — Z92241 Personal history of systemic steroid therapy: Secondary | ICD-10-CM | POA: Insufficient documentation

## 2018-11-20 DIAGNOSIS — R0609 Other forms of dyspnea: Secondary | ICD-10-CM | POA: Insufficient documentation

## 2018-11-20 LAB — CUP PACEART REMOTE DEVICE CHECK
Date Time Interrogation Session: 20200602134015
Implantable Pulse Generator Implant Date: 20200430

## 2018-11-20 MED FILL — CALQUENCE 100 MG CAPSULE: 100 | 30 days supply | Qty: 30 | Fill #0

## 2018-11-20 NOTE — Telephone Encounter (Signed)
Oral Oncology Patient Advocate Encounter  Confirmed with [place] that [place] was shipped on 6/2 with a $100 copay.   [place] [facility] [place] Phone [phone] Fax [phone] 11/20/2018   10:56 AM

## 2018-11-20 NOTE — Telephone Encounter (Signed)
Spoke with pt and informed him that Dr Curt Bears recommended that he continue to monitor the bruising at pacemaker incision site and call for wound check in office if area expands or pain increases.

## 2018-11-20 NOTE — Telephone Encounter (Signed)
Pt called and LMOVM.   I called pt back. He wanted an update. I informed him of MD recommendations. He verbalized understanding. He would like a RN to call back he has additional questions that I could not answer for him.

## 2018-11-20 NOTE — Telephone Encounter (Signed)
If bruising improving, would continue to monitor. May need wound check next week if still bothersome.

## 2018-11-20 NOTE — Telephone Encounter (Signed)
Called pt & he verified that he is planning to do just acalabrutinib & not Rituxan/Bendeka.  Jesse/Phamacist talked with pt already about oral drug & pt states that it should come tomorrow.  He reports that he does not have any questions & feels that Denyse Amass did a great job explaining things.

## 2018-11-20 NOTE — Telephone Encounter (Signed)
See phone note from 11/19/18.

## 2018-11-21 ENCOUNTER — Other Ambulatory Visit: Payer: Medicare Other

## 2018-11-21 ENCOUNTER — Inpatient Hospital Stay: Payer: Medicare Other

## 2018-11-22 ENCOUNTER — Encounter (HOSPITAL_COMMUNITY): Payer: Self-pay

## 2018-11-22 ENCOUNTER — Ambulatory Visit: Payer: Medicare Other

## 2018-11-22 ENCOUNTER — Telehealth: Payer: Self-pay | Admitting: Pharmacist

## 2018-11-22 ENCOUNTER — Emergency Department (HOSPITAL_COMMUNITY)
Admission: EM | Admit: 2018-11-22 | Discharge: 2018-11-22 | Disposition: A | Discharge status: Home / Self Care | Payer: Medicare Other | Attending: Emergency Medicine | Admitting: Emergency Medicine

## 2018-11-22 ENCOUNTER — Telehealth: Payer: Self-pay | Admitting: *Deleted

## 2018-11-22 ENCOUNTER — Other Ambulatory Visit: Payer: Self-pay

## 2018-11-22 DIAGNOSIS — C9111 Chronic lymphocytic leukemia of B-cell type in remission: Secondary | ICD-10-CM | POA: Diagnosis not present

## 2018-11-22 DIAGNOSIS — Z79899 Other long term (current) drug therapy: Secondary | ICD-10-CM | POA: Diagnosis not present

## 2018-11-22 DIAGNOSIS — R0602 Shortness of breath: Secondary | ICD-10-CM | POA: Insufficient documentation

## 2018-11-22 DIAGNOSIS — I129 Hypertensive chronic kidney disease with stage 1 through stage 4 chronic kidney disease, or unspecified chronic kidney disease: Secondary | ICD-10-CM | POA: Insufficient documentation

## 2018-11-22 DIAGNOSIS — N183 Chronic kidney disease, stage 3 (moderate): Secondary | ICD-10-CM | POA: Insufficient documentation

## 2018-11-22 NOTE — Telephone Encounter (Signed)
Informed patient that [name] and the ER physician both do not think he had an allergic reaction to the drug. Not able to pinpoint specific reason for his transient increase in shortness of breath. He needs to proceed with next dose tomorrow as scheduled. Keep appointment on 11/30/2018 as scheduled.

## 2018-11-22 NOTE — Telephone Encounter (Signed)
Called from emergency department reporting that his shortness of breath has improved, adding "I'm almost back to normal".  Waiting to be seen by physician. Reports this started at 1130 after taking the calquence 100 mg at around 1030. Wants to speak with MD before he takes another dose tomorrow to be sure he can take it. Sees MD on 6/12 and asking if he needs to come in sooner.  Message forwarded to MD.

## 2018-11-22 NOTE — ED Notes (Signed)
ED Provider at bedside. 

## 2018-11-22 NOTE — ED Triage Notes (Signed)
Patient reports that he started Callquex for Chronic lymphatic leukemia/ patient states he took this med at 67 today

## 2018-11-22 NOTE — ED Provider Notes (Signed)
[facility] DEPT Provider Note   CSN: [id number] Arrival date & time: 11/22/18  1230    History   Chief Complaint Chief Complaint  Patient presents with  . Shortness of Breath  . Medication Reaction    HPI [name] is a 78 y.o. male.     The history is provided by the patient and medical records. No language interpreter was used.  Shortness of Breath   [name] is a 78 y.o. male who presents to the Emergency Department complaining of sob. He presents to the emergency department for evaluation of shortness of breath and chest tightness that began around 1130 this morning. Overall his symptoms are mild and improving. He took the first dose of Callquex for his CLL today. He denies any fevers, cough, nausea, vomiting, leg swelling or pain. He read that shortness of breath was a side effect to the medication and he is unsure if he should continue taking the medication. Symptoms are mild and improving. No known exposures to the coronavirus. He lives at home with his wife and cat. Past Medical History:  Diagnosis Date  . Acute encephalopathy 08/17/2016  . Acute renal failure superimposed on stage 3 chronic kidney disease ([place]) 10/15/2018  . AKI (acute kidney injury) ([place]) 08/16/2016  . Arthritis   . Bacteremia due to Enterobacter species 08/19/2016  . Carpal tunnel syndrome, bilateral 10/09/2017  . Chest tightness 10/15/2018  . CLL (chronic lymphocytic leukemia) ([place]) 12/16/2014  . Dyspnea 09/20/2012  . Elevated troponin 10/16/2018  . Enterobacter sepsis ([place]) 08/19/2016  . Fall 10/15/2018  . GERD (gastroesophageal reflux disease)   . Hepatitis 1966   Drug reaction after taking medication  . Hyperglycemia   . Hyperlipemia   . Hypertension   . Hypokalemia 08/16/2016  . Lung nodule seen on imaging study    bilateral lungs  . Nasal bone fracture 10/15/2018  . Right foot pain   . SAH (subarachnoid hemorrhage) ([place]) 10/15/2018  . Syncope 10/15/2018  .  Thrombocytopenia ([place]) 10/15/2018  . Urinary tract infection with hematuria     Patient Active Problem List   Diagnosis Date Noted  . Goals of care, counseling/discussion 11/13/2018  . Coronary artery disease involving native coronary artery of native heart without angina pectoris 11/01/2018  . Elevated troponin 10/16/2018  . HLD (hyperlipidemia) 10/15/2018  . Syncope 10/15/2018  . Nasal bone fracture 10/15/2018  . Chest tightness 10/15/2018  . SAH (subarachnoid hemorrhage) ([place]) 10/15/2018  . Acute renal failure superimposed on stage 3 chronic kidney disease ([place]) 10/15/2018  . Thrombocytopenia ([place]) 10/15/2018  . Fall 10/15/2018  . Carpal tunnel syndrome, bilateral 10/09/2017  . Right foot pain   . Enterobacter sepsis ([place]) 08/19/2016  . Bacteremia due to Enterobacter species 08/19/2016  . Acute encephalopathy 08/17/2016  . Hyperglycemia   . Urinary tract infection with hematuria   . Hypertension 08/16/2016  . AKI (acute kidney injury) ([place]) 08/16/2016  . Hypokalemia 08/16/2016  . CLL (chronic lymphocytic leukemia) ([place]) 12/16/2014  . Dyspnea 09/20/2012    Past Surgical History:  Procedure Laterality Date  . APPENDECTOMY    . arthroscoyp  2000   left knee  . COLONOSCOPY W/ POLYPECTOMY    . LOOP RECORDER INSERTION N/A 10/18/2018   Procedure: LOOP RECORDER INSERTION;  Surgeon: [name], MD;  Location: [facility];  Service: Cardiovascular;  Laterality: N/A;  . LUMBAR LAMINECTOMY/DECOMPRESSION MICRODISCECTOMY  06/27/2012   Procedure: LUMBAR LAMINECTOMY/DECOMPRESSION MICRODISCECTOMY 1 LEVEL;  Surgeon: Camelia Eng  Ronnald Ramp, MD;  Location: [facility];  Service: Neurosurgery;  Laterality: Bilateral;  bilateral four-five laminectony  . SKIN CANCER EXCISION  5-6 years ago   moe-s surgery- basal b  . TONSILLECTOMY          Home Medications    Prior to Admission medications   Medication Sig Start Date End Date Taking? Authorizing Provider  Acalabrutinib  (CALQUENCE) 100 MG CAPS Take 100 mg by mouth daily. 11/19/18   Ladell Pier, MD  bisacodyl (DULCOLAX) 5 MG EC tablet Take 5 mg by mouth daily as needed for moderate constipation.    [provider]  Cholecalciferol (VITAMIN D) 2000 units CAPS Take 2,000 Units by mouth daily.     [provider]  diphenhydrAMINE (BENADRYL) 25 MG tablet Take 12.5 mg by mouth at bedtime as needed.    [provider]  EPINEPHrine (EPIPEN) 0.3 mg/0.3 mL DEVI Inject 0.3 mLs (0.3 mg total) into the muscle once. Patient not taking: Reported on 11/05/2018 05/26/12   Rise Mu, PA-C  Loratadine 10 MG CAPS Take 10 mg by mouth.    [provider]  predniSONE (DELTASONE) 20 MG tablet Take 30 mg by mouth daily.    [provider]  simvastatin (ZOCOR) 40 MG tablet Take 20 mg by mouth daily.     [provider]  Tamsulosin HCl (FLOMAX) 0.4 MG CAPS Take 1 capsule (0.4 mg total) by mouth daily. 06/30/12   Jovita Gamma, MD    Family History Family History  Problem Relation Age of Onset  . Sudden death Mother 80       possible botulism  . Dementia Father   . Seizures Sister     Social History Social History   Tobacco Use  . Smoking status: Never Smoker  . Smokeless tobacco: Never Used  Substance Use Topics  . Alcohol use: Yes    Alcohol/week: 2.0 standard drinks    Types: 2 Cans of beer per week    Comment: rare 2 beers per month  . Drug use: No     Allergies   Patient has no known allergies.   Review of Systems Review of Systems  Respiratory: Positive for shortness of breath.   All other systems reviewed and are negative.    Physical Exam Updated Vital Signs BP (!) 147/82   Pulse 78   Temp 98 F (36.7 C) (Oral)   Resp 13   Ht 5\' 3"  (1.6 m)   Wt 68 kg   SpO2 99%   BMI 26.57 kg/m   Physical Exam Vitals signs and nursing note reviewed.  Constitutional:      Appearance: He is well-developed.  HENT:     Head: Normocephalic and  atraumatic.  Cardiovascular:     Rate and Rhythm: Normal rate and regular rhythm.     Heart sounds: No murmur.  Pulmonary:     Effort: Pulmonary effort is normal. No respiratory distress.     Breath sounds: Normal breath sounds.  Abdominal:     Palpations: Abdomen is soft.     Tenderness: There is no abdominal tenderness. There is no guarding or rebound.  Musculoskeletal:        General: No swelling or tenderness.  Skin:    General: Skin is warm and dry.  Neurological:     Mental Status: He is alert and oriented to person, place, and time.  Psychiatric:        Mood and Affect: Mood normal.  Behavior: Behavior normal.      ED Treatments / Results  Labs (all labs ordered are listed, but only abnormal results are displayed) Labs Reviewed - No data to display  EKG EKG Interpretation  Date/Time:  Thursday November 22 2018 13:46:50 EDT Ventricular Rate:  81 PR Interval:    QRS Duration: 91 QT Interval:  379 QTC Calculation: 440 R Axis:   80 Text Interpretation:  Sinus rhythm No significant change since 10/31/18 Confirmed by Quintella Reichert [phone]) on 11/22/2018 1:51:05 PM   Radiology No results found.  Procedures Procedures (including critical care time)  Medications Ordered in ED Medications - No data to display   Initial Impression / Assessment and Plan / ED Course  I have reviewed the triage vital signs and the nursing notes.  Pertinent labs & imaging results that were available during my care of the patient were reviewed by me and considered in my medical decision making (see chart for details).        Patient with history of CLL here for evaluation of shortness of breath after taking dose of medication earlier today. His symptoms are mild in nature and overall improving. He is in no respiratory distress in the emergency department. EKG with sinus rhythm, no significant change from prior. Discussed with patient checking labs and he declines today. Current  presentation is not consistent with ACS, CHF, pneumonia. There is no evidence of anaphylaxis based on history or examination. Discussed with patient home care for dyspnea. Discussed importance of oncology follow-up as well as return precautions.  Presentation is not c/w covid19 infection.   [name] was evaluated in Emergency Department on 11/22/2018 for the symptoms described in the history of present illness. He was evaluated in the context of the global COVID-19 pandemic, which necessitated consideration that the patient might be at risk for infection with the SARS-CoV-2 virus that causes COVID-19. Institutional protocols and algorithms that pertain to the evaluation of patients at risk for COVID-19 are in a state of rapid change based on information released by regulatory bodies including the CDC and federal and state organizations. These policies and algorithms were followed during the patient's care in the ED.   Final Clinical Impressions(s) / ED Diagnoses   Final diagnoses:  Shortness of breath    ED Discharge Orders    None       Quintella Reichert, MD 11/22/18 [phone]

## 2018-11-22 NOTE — Telephone Encounter (Signed)
Oral Oncology Pharmacist Encounter  Received call from patient stating that he took his first dose of Calquence 100 mg capsules this morning at 10:30 AM and he is now experiencing shortness of breath. Labored breathing is noted while on the phone.  Patient instructed that he needs to be seen immediately and was told to go to the emergency department.  Patient requested to be transferred to Dr. Gearldine Shown collaborative RN instead.  He was transferred and left a message for Merceda Elks, RN.  I spoke with Manuela Schwartz about urgent issue, and we are in agreement that with continued shortness of breath patient needs to be seen in the emergency department.  I returned call to patient and again instructed him to go to the emergency department. He stated that in the 5 minutes since I last spoke with him his shortness of breath was not as bad.  Patient again instructed that we are unsure whether or not he is having a medication reaction and he should be evaluated immediately, and this was not something that could occur at the cancer center at this time.  Patient stated that he wished to wait an hour to see if his breathing got better prior to making the decision of whether or not to go to the emergency department.  Patient informed that I would be documenting telephone call in the EMR. Note will be forwarded to MD and collaborative practice RN.  [name], PharmD, BCPS, BCOP  11/22/2018 11:56 AM [facility]cology [facility] [phone]

## 2018-11-23 ENCOUNTER — Encounter (HOSPITAL_COMMUNITY): Payer: Self-pay | Admitting: Oncology

## 2018-11-28 NOTE — Progress Notes (Signed)
Carelink Summary Report / Loop Recorder 

## 2018-11-30 ENCOUNTER — Inpatient Hospital Stay (HOSPITAL_BASED_OUTPATIENT_CLINIC_OR_DEPARTMENT_OTHER): Payer: Medicare Other | Admitting: Nurse Practitioner

## 2018-11-30 ENCOUNTER — Inpatient Hospital Stay: Payer: Medicare Other

## 2018-11-30 ENCOUNTER — Telehealth: Payer: Self-pay | Admitting: *Deleted

## 2018-11-30 ENCOUNTER — Encounter: Payer: Self-pay | Admitting: Nurse Practitioner

## 2018-11-30 ENCOUNTER — Other Ambulatory Visit: Payer: Self-pay

## 2018-11-30 VITALS — BP 142/78 | HR 96 | Temp 97.7°F | Resp 18 | Ht 63.0 in | Wt 152.9 lb

## 2018-11-30 DIAGNOSIS — Z92241 Personal history of systemic steroid therapy: Secondary | ICD-10-CM | POA: Diagnosis not present

## 2018-11-30 DIAGNOSIS — D696 Thrombocytopenia, unspecified: Secondary | ICD-10-CM | POA: Diagnosis not present

## 2018-11-30 DIAGNOSIS — R0609 Other forms of dyspnea: Secondary | ICD-10-CM | POA: Diagnosis not present

## 2018-11-30 DIAGNOSIS — C9110 Chronic lymphocytic leukemia of B-cell type not having achieved remission: Secondary | ICD-10-CM

## 2018-11-30 LAB — CMP (CANCER CENTER ONLY)
ALT: 10 U/L (ref 0–44)
AST: 13 U/L — ABNORMAL LOW (ref 15–41)
Albumin: 4.1 g/dL (ref 3.5–5.0)
Alkaline Phosphatase: 82 U/L (ref 38–126)
Anion gap: 9 (ref 5–15)
BUN: 24 mg/dL — ABNORMAL HIGH (ref 8–23)
CO2: 27 mmol/L (ref 22–32)
Calcium: 9.2 mg/dL (ref 8.9–10.3)
Chloride: 102 mmol/L (ref 98–111)
Creatinine: 1.56 mg/dL — ABNORMAL HIGH (ref 0.61–1.24)
GFR, Est AFR Am: 49 mL/min — ABNORMAL LOW (ref >60)
GFR, Estimated: 42 mL/min — ABNORMAL LOW (ref >60)
Glucose, Bld: 137 mg/dL — ABNORMAL HIGH (ref 70–99)
Potassium: 4.5 mmol/L (ref 3.5–5.1)
Sodium: 138 mmol/L (ref 135–145)
Total Bilirubin: 0.5 mg/dL (ref 0.3–1.2)
Total Protein: 6.8 g/dL (ref 6.5–8.1)

## 2018-11-30 LAB — CBC WITH DIFFERENTIAL (CANCER CENTER ONLY)
Abs Immature Granulocytes: 0.21 10*3/uL — ABNORMAL HIGH (ref 0.00–0.07)
Basophils Absolute: 0.1 10*3/uL (ref 0.0–0.1)
Basophils Relative: 0 %
Eosinophils Absolute: 0.2 10*3/uL (ref 0.0–0.5)
Eosinophils Relative: 0 %
HCT: 41.7 % (ref 39.0–52.0)
Hemoglobin: 12.5 g/dL — ABNORMAL LOW (ref 13.0–17.0)
Immature Granulocytes: 0 %
Lymphocytes Relative: 88 %
Lymphs Abs: 71.5 10*3/uL — ABNORMAL HIGH (ref 0.7–4.0)
MCH: 29.3 pg (ref 26.0–34.0)
MCHC: 30 g/dL (ref 30.0–36.0)
MCV: 97.9 fL (ref 80.0–100.0)
Monocytes Absolute: 1.5 10*3/uL — ABNORMAL HIGH (ref 0.1–1.0)
Monocytes Relative: 2 %
Neutro Abs: 8.1 10*3/uL — ABNORMAL HIGH (ref 1.7–7.7)
Neutrophils Relative %: 10 %
Platelet Count: 65 10*3/uL — ABNORMAL LOW (ref 150–400)
RBC: 4.26 MIL/uL (ref 4.22–5.81)
RDW: 13.4 % (ref 11.5–15.5)
WBC Count: 81.6 10*3/uL (ref 4.0–10.5)
nRBC: 0 % (ref 0.0–0.2)

## 2018-11-30 NOTE — Telephone Encounter (Signed)
Received call report from [place].  "Today's WBC = 81.6."  Results ready for review with today's scheduled provider F/U.

## 2018-11-30 NOTE — Progress Notes (Addendum)
[name] OFFICE PROGRESS NOTE   Diagnosis: CLL  INTERVAL HISTORY:   [name] returns for follow-up.  He began acalabrutinib 11/22/2018.  He was seen in the emergency department on 11/22/2018 for evaluation of shortness of breath.  Symptoms were felt to be mild in nature and improved.  Presentation was not felt to be consistent with ACS, CHF, pneumonia.  Evaluation was unremarkable.  He was discharged home.  He continues out acalabrutinib.  He has intermittent shortness of breath.  He feels the shortness of breath worsens when he is anxious.  No fever or cough.  He denies nausea/vomiting.  No mouth sores.  No diarrhea.  No rash.  He denies bleeding.  Objective:  Vital signs in last 24 hours:  Blood pressure (!) 142/78, pulse 96, temperature 97.7 F (36.5 C), temperature source Oral, resp. rate 18, height _0  (1.6 m), weight 152 lb 14.4 oz (69.4 kg), SpO2 100 %.    HEENT: No thrush or ulcers. Lymphatics: 1 cm or less bilateral axillary and inguinal lymph nodes. Resp: Lungs clear bilaterally.  No respiratory distress. Cardio: Regular rate and rhythm. GI: No hepatosplenomegaly. Vascular: No leg edema. Skin: Tiny ecchymosis at the left forearm.  Ecchymosis at the right mid back region.   Lab Results:  Lab Results  Component Value Date   WBC 81.6 (HH) 11/30/2018   HGB 12.5 (L) 11/30/2018   HCT 41.7 11/30/2018   MCV 97.9 11/30/2018   PLT 65 (L) 11/30/2018   NEUTROABS 8.1 (H) 11/30/2018    Imaging:  No results found.  Medications: I have reviewed the patient's current medications.  Assessment/Plan: 1. CLL ? Review of the peripheral blood smear is consistent with chronic lymphocytic leukemia ? 12/16/2014 Peripheral blood flow cytometry consistent with CLL ? 10/16/2018 MRI brain and cervical spine - Prominent bulky cervical adenopathy within the visualized neck,compatible with history of CLL. ? FISH analysis- gain of 13q; 17p and 11q not detected ?  Acalabrutinib 11/22/2018 2.thrombocytopenia-likely secondary to chronic lymphocytic leukemia versus "pseudo thrombocytopenia"due to platelet clumping; progressive thrombocytopenia June 2018  Trial of prednisone started 10/17/2018 after admission with a fall/subarachnoid hemorrhage-starting dose 60 mg daily  Prednisone tapered to 40 mg daily beginning 10/22/2018  Prednisone taper to 30 mg daily beginning 10/29/2018, tapered to off over 6 days beginning 11/13/2018  Bone marrow biopsy 11/09/2018-hypercellular marrow with prominent involvement by CLL, abundant megakaryocytes, lymphocytes represent 82% of all cells, the cytogenetics returned normal 3.Hospitalization with sepsis/UTI with bacteremia (Enterobacter aerogenes)March 2018 4. Hospitalization for syncopal episode resulting in fall. Developed a small right subarachnoid hemorrhage.  5. MRI brain 10/16/2018 - Focal 1-2 cm osseous lesions involving the right parietal and occipital calvarium as above, indeterminate. Findings are of uncertain significance, with no definite corresponding osseous lesion seen on prior CT. 6.  History of idiopathic "anaphylactoid "reactions   Disposition: [name] appears stable.  He began acalabrutinib 11/22/2018.  Review of the CBC from today shows improvement in the platelet count and white count.  He seems to be tolerating acalabrutinib well and will continue at the current dose.  He will return for a follow-up CBC on 12/10/2018.  He will return for lab and follow-up on 12/19/2018.  He will contact the office in the interim with any problems.  Above discussed with his wife by telephone during the office visit.  Patient seen with [name].    [name] ANP/GNP-BC   11/30/2018  2:41 PM This was a shared visit with [name].  [name] appears to be tolerating the acalabrutinib well.  The platelet count is improved.  He will continue acalabrutinib at the current dose.  We discussed the treatment plan  with his wife by telephone.  [name] Manson, MD

## 2018-12-03 ENCOUNTER — Telehealth: Payer: Self-pay | Admitting: Oncology

## 2018-12-03 NOTE — Telephone Encounter (Signed)
Scheduled per los. Called patient. No answer and no voicemail.

## 2018-12-05 ENCOUNTER — Telehealth: Payer: Self-pay

## 2018-12-05 NOTE — Telephone Encounter (Signed)
Pt has a questions about the home remote check. I informed the pt that the remote appointment is with the bedside monitor. The monitor will automatically send. He do not need to do anything for that appointment. I also told him if everything looks normal on the transmission he will not hear from [place]. We will only call if we see an episode of some sort on the transmission.  The pt had a bruise at the wound site but it is getting better. It looks like it pushing out but believes it just blood.

## 2018-12-05 NOTE — Telephone Encounter (Signed)
Spoke with patient. Bruising at ILR site continues to improve, nearly gone now. Pt reports incision seems to be well healed, denies drainage, redness, fever/chills. Reports he isn't too concerned, but wanted to follow-up with [place] due to his slow healing. Peports he feels a "perpendicular ridge" near his implant site, feels it is soft to palpation, not painful. Explained he could be feeling the loop recorder beneath his skin, but difficult to tell based on his description. Offered DC appointment tomorrow, but patient declines to come into the office at this time. His friend is coming to visit on Monday and has a smart phone. He will ask his friend to take a photo and send it in. Pt thanked me for my call, agrees to call back if any questions or concerns in the interim.

## 2018-12-10 ENCOUNTER — Inpatient Hospital Stay: Payer: Medicare Other

## 2018-12-10 ENCOUNTER — Telehealth: Payer: Self-pay | Admitting: *Deleted

## 2018-12-10 ENCOUNTER — Other Ambulatory Visit: Payer: Self-pay | Admitting: *Deleted

## 2018-12-10 ENCOUNTER — Other Ambulatory Visit: Payer: Self-pay

## 2018-12-10 DIAGNOSIS — C9110 Chronic lymphocytic leukemia of B-cell type not having achieved remission: Secondary | ICD-10-CM | POA: Diagnosis not present

## 2018-12-10 LAB — CBC WITH DIFFERENTIAL (CANCER CENTER ONLY)
Abs Immature Granulocytes: 0.24 10*3/uL — ABNORMAL HIGH (ref 0.00–0.07)
Basophils Absolute: 0.1 10*3/uL (ref 0.0–0.1)
Basophils Relative: 0 %
Eosinophils Absolute: 0.2 10*3/uL (ref 0.0–0.5)
Eosinophils Relative: 0 %
HCT: 39.3 % (ref 39.0–52.0)
Hemoglobin: 11.9 g/dL — ABNORMAL LOW (ref 13.0–17.0)
Immature Granulocytes: 0 %
Lymphocytes Relative: 88 %
Lymphs Abs: 70.3 10*3/uL — ABNORMAL HIGH (ref 0.7–4.0)
MCH: 29.8 pg (ref 26.0–34.0)
MCHC: 30.3 g/dL (ref 30.0–36.0)
MCV: 98.5 fL (ref 80.0–100.0)
Monocytes Absolute: 0.9 10*3/uL (ref 0.1–1.0)
Monocytes Relative: 1 %
Neutro Abs: 8.4 10*3/uL — ABNORMAL HIGH (ref 1.7–7.7)
Neutrophils Relative %: 11 %
Platelet Count: 71 10*3/uL — ABNORMAL LOW (ref 150–400)
RBC: 3.99 MIL/uL — ABNORMAL LOW (ref 4.22–5.81)
RDW: 13.6 % (ref 11.5–15.5)
WBC Count: 80.2 10*3/uL (ref 4.0–10.5)
nRBC: 0 % (ref 0.0–0.2)

## 2018-12-10 NOTE — Telephone Encounter (Signed)
Received call report from [place].  "Today's Wbc = 80.2."  Routing phone encouter with results.  Next scheduled provider F/U appt. 12-19-2018.

## 2018-12-10 NOTE — Telephone Encounter (Signed)
Patient notified of CBC results. Asking if he can expect platelets to increase more slowly now and will it be OK to move his 10:00 am dosing of acalabrutinib to 0730. Per [name]: Can expect more slow increase in platelets now and OK to change administration time to 0730. F/U as scheduled.

## 2018-12-15 ENCOUNTER — Other Ambulatory Visit: Payer: Self-pay | Admitting: Oncology

## 2018-12-17 ENCOUNTER — Telehealth: Payer: Self-pay | Admitting: *Deleted

## 2018-12-17 NOTE — Telephone Encounter (Signed)
Patient sent in photos of loop recorder site (see below). Recently started oral chemo drug for CLL. Pt reports bruising is now about the size of a quarter, continues to improve. Feels the "protrusion" hasn't changed in size, not painful, describes it as "kind of puffiness but semi-hard" to palpation, about 1.5" long and a little over 1/4" wide. Explained to pt that it sounds like he is describing the loop recorder itself. Denies signs/symptoms of infection, but feels site is still a little swollen.    Encouraged pt to continue monitoring as healing process could be slower due to CLL. Advised I will send this message to Dr. Curt Bears for review and recommendations. Pt is agreeable to plan and denies questions or concerns at this time.

## 2018-12-18 ENCOUNTER — Other Ambulatory Visit: Payer: Self-pay

## 2018-12-18 ENCOUNTER — Other Ambulatory Visit: Payer: Self-pay | Admitting: Oncology

## 2018-12-18 ENCOUNTER — Telehealth: Payer: Self-pay | Admitting: Oncology

## 2018-12-18 ENCOUNTER — Inpatient Hospital Stay (HOSPITAL_BASED_OUTPATIENT_CLINIC_OR_DEPARTMENT_OTHER): Payer: Medicare Other | Admitting: Oncology

## 2018-12-18 ENCOUNTER — Telehealth: Payer: Self-pay | Admitting: *Deleted

## 2018-12-18 ENCOUNTER — Inpatient Hospital Stay: Payer: Medicare Other

## 2018-12-18 VITALS — BP 135/50 | HR 62 | Temp 97.7°F | Resp 18 | Ht 63.0 in | Wt 157.0 lb

## 2018-12-18 DIAGNOSIS — Z92241 Personal history of systemic steroid therapy: Secondary | ICD-10-CM | POA: Diagnosis not present

## 2018-12-18 DIAGNOSIS — D696 Thrombocytopenia, unspecified: Secondary | ICD-10-CM

## 2018-12-18 DIAGNOSIS — C9110 Chronic lymphocytic leukemia of B-cell type not having achieved remission: Secondary | ICD-10-CM | POA: Diagnosis not present

## 2018-12-18 DIAGNOSIS — R0609 Other forms of dyspnea: Secondary | ICD-10-CM | POA: Diagnosis not present

## 2018-12-18 LAB — CMP (CANCER CENTER ONLY)
ALT: 12 U/L (ref 0–44)
AST: 13 U/L — ABNORMAL LOW (ref 15–41)
Albumin: 4.1 g/dL (ref 3.5–5.0)
Alkaline Phosphatase: 70 U/L (ref 38–126)
Anion gap: 9 (ref 5–15)
BUN: 27 mg/dL — ABNORMAL HIGH (ref 8–23)
CO2: 24 mmol/L (ref 22–32)
Calcium: 8.7 mg/dL — ABNORMAL LOW (ref 8.9–10.3)
Chloride: 106 mmol/L (ref 98–111)
Creatinine: 1.38 mg/dL — ABNORMAL HIGH (ref 0.61–1.24)
GFR, Est AFR Am: 57 mL/min — ABNORMAL LOW (ref >60)
GFR, Estimated: 49 mL/min — ABNORMAL LOW (ref >60)
Glucose, Bld: 77 mg/dL (ref 70–99)
Potassium: 4.3 mmol/L (ref 3.5–5.1)
Sodium: 139 mmol/L (ref 135–145)
Total Bilirubin: 0.3 mg/dL (ref 0.3–1.2)
Total Protein: 6.3 g/dL — ABNORMAL LOW (ref 6.5–8.1)

## 2018-12-18 LAB — CBC WITH DIFFERENTIAL (CANCER CENTER ONLY)
Abs Immature Granulocytes: 0.2 10*3/uL — ABNORMAL HIGH (ref 0.00–0.07)
Basophils Absolute: 0.1 10*3/uL (ref 0.0–0.1)
Basophils Relative: 0 %
Eosinophils Absolute: 0.2 10*3/uL (ref 0.0–0.5)
Eosinophils Relative: 0 %
HCT: 39.1 % (ref 39.0–52.0)
Hemoglobin: 11.9 g/dL — ABNORMAL LOW (ref 13.0–17.0)
Immature Granulocytes: 0 %
Lymphocytes Relative: 86 %
Lymphs Abs: 58.8 10*3/uL — ABNORMAL HIGH (ref 0.7–4.0)
MCH: 29.6 pg (ref 26.0–34.0)
MCHC: 30.4 g/dL (ref 30.0–36.0)
MCV: 97.3 fL (ref 80.0–100.0)
Monocytes Absolute: 1 10*3/uL (ref 0.1–1.0)
Monocytes Relative: 2 %
Neutro Abs: 8.6 10*3/uL — ABNORMAL HIGH (ref 1.7–7.7)
Neutrophils Relative %: 12 %
Platelet Count: 58 10*3/uL — ABNORMAL LOW (ref 150–400)
RBC: 4.02 MIL/uL — ABNORMAL LOW (ref 4.22–5.81)
RDW: 13.7 % (ref 11.5–15.5)
WBC Count: 68.9 10*3/uL (ref 4.0–10.5)
nRBC: 0 % (ref 0.0–0.2)

## 2018-12-18 NOTE — Progress Notes (Signed)
  [name] OFFICE PROGRESS NOTE   Diagnosis: CLL, thrombocytopenia  INTERVAL HISTORY:   [name] returns as scheduled.  He continues acalabrutinib.  No rash or arthralgias.  He feels well.  He continues to have exertional dyspnea.  No falls.  Objective:  Vital signs in last 24 hours:  Blood pressure (!) 135/50, pulse 62, temperature 97.7 F (36.5 C), temperature source Oral, resp. rate 18, height '5\' 3"'$  (1.6 m), weight 157 lb (71.2 kg), SpO2 100 %.    Lymphatics: Less than 1 cm left low cervical and bilateral axillary nodes.  1/2-1 centimeter bilateral inguinal nodes Resp: Lungs clear bilaterally Cardio: Regular rate and rhythm GI: No hepatosplenomegaly Vascular: No leg edema   Lab Results:  Lab Results  Component Value Date   WBC 68.9 (HH) 12/18/2018   HGB 11.9 (L) 12/18/2018   HCT 39.1 12/18/2018   MCV 97.3 12/18/2018   PLT 58 (L) 12/18/2018   NEUTROABS 8.6 (H) 12/18/2018    CMP  Lab Results  Component Value Date   NA 139 12/18/2018   K 4.3 12/18/2018   CL 106 12/18/2018   CO2 24 12/18/2018   GLUCOSE 77 12/18/2018   BUN 27 (H) 12/18/2018   CREATININE 1.38 (H) 12/18/2018   CALCIUM 8.7 (L) 12/18/2018   PROT 6.3 (L) 12/18/2018   ALBUMIN 4.1 12/18/2018   AST 13 (L) 12/18/2018   ALT 12 12/18/2018   ALKPHOS 70 12/18/2018   BILITOT 0.3 12/18/2018   GFRNONAA 49 (L) 12/18/2018   GFRAA 57 (L) 12/18/2018     Medications: I have reviewed the patient's current medications.   Assessment/Plan: 1. CLL ? Review of the peripheral blood smear is consistent with chronic lymphocytic leukemia ? 12/16/2014 Peripheral blood flow cytometry consistent with CLL ? 10/16/2018 MRI brain and cervical spine - Prominent bulky cervical adenopathy within the visualized neck,compatible with history of CLL. ? FISH analysis- gain of 13q; 17p and 11q not detected ? Acalabrutinib 11/22/2018 2.thrombocytopenia-likely secondary to chronic lymphocytic leukemia versus  "pseudo thrombocytopenia"due to platelet clumping; progressive thrombocytopenia June 2018  Trial of prednisone started 10/17/2018 after admission with a fall/subarachnoid hemorrhage-starting dose 60 mg daily  Prednisone tapered to 40 mg daily beginning 10/22/2018  Prednisone taper to 30 mg daily beginning 10/29/2018, tapered to off over 6 days beginning 11/13/2018  Bone marrow biopsy 11/09/2018-hypercellular marrow with prominent involvement by CLL, abundant megakaryocytes, lymphocytes represent 82% of all cells, the cytogenetics returned normal 3.Hospitalization with sepsis/UTI with bacteremia (Enterobacter aerogenes)March 2018 4. Hospitalization for syncopal episode resulting in fall. Developed a small right subarachnoid hemorrhage.  5. MRI brain 10/16/2018 - Focal 1-2 cm osseous lesions involving the right parietal and occipital calvarium as above, indeterminate. Findings are of uncertain significance, with no definite corresponding osseous lesion seen on prior CT. 6.  History of idiopathic "anaphylactoid "reactions    Disposition: [name] appears stable.  He is tolerating the alcohol ibrutinib well.  The platelet count remains moderately decreased.  The plan is to continue acalabrutinib at the current dose.  He will return for a CBC in 3 weeks and an office visit in 6 weeks.  He plans to schedule a pulmonary medicine appointment for evaluation of exertional dyspnea.  Betsy Coder, MD  12/18/2018  3:21 PM

## 2018-12-18 NOTE — Telephone Encounter (Signed)
Scheduled appt per 6/30 los - gave patient AVS and calender per los.

## 2018-12-18 NOTE — Telephone Encounter (Signed)
Received call report from [place].  "Today's WBC = 68.9."  Routing ph9one encounter and secure chat with results.  Scheduled provider F/U today.

## 2018-12-19 ENCOUNTER — Ambulatory Visit: Payer: Medicare Other | Admitting: Oncology

## 2018-12-19 ENCOUNTER — Other Ambulatory Visit: Payer: Medicare Other

## 2018-12-19 MED FILL — CALQUENCE 100 MG CAPSULE: 100 | 30 days supply | Qty: 30 | Fill #0

## 2018-12-24 ENCOUNTER — Ambulatory Visit (INDEPENDENT_AMBULATORY_CARE_PROVIDER_SITE_OTHER): Payer: Medicare Other | Admitting: *Deleted

## 2018-12-24 DIAGNOSIS — R55 Syncope and collapse: Secondary | ICD-10-CM | POA: Diagnosis not present

## 2018-12-24 LAB — CUP PACEART REMOTE DEVICE CHECK
Date Time Interrogation Session: 20200705141159
Implantable Pulse Generator Implant Date: 20200430

## 2018-12-26 NOTE — Telephone Encounter (Signed)
Pt states he still feels his loop recorder and would like for Raquel Sarna to give him a call back. Pt states he do wants to come into the office to be seen.

## 2018-12-26 NOTE — Telephone Encounter (Addendum)
Discussed with Dr. Curt Bears, who recommended AF Clinic f/u and in-person wound assessment. Pt and wife are agreeable to a DC appointment tomorrow morning at 8:30am and appointment with Adline Peals, PA, in AF Clinic tomorrow at 9:30am. [facility] Clinic address, parking instructions/code, and phone number given. Pt and wife verbalize understanding of instructions and thanked me for my call.      COVID-19 Pre-Screening Questions:  . In the past 7 to 10 days have you had a cough,  shortness of breath, headache, congestion, fever (100 or greater) body aches, chills, sore throat, or sudden loss of taste or sense of smell? . Have you been around anyone with known Covid 19? Marland Kitchen Have you been around anyone who is awaiting Covid 19 test results in the past 7 to 10 days? . Have you been around anyone who has been exposed to Covid 19, or has mentioned symptoms of Covid 19 within the past 7 to 10 days?   Pt answered "no" to all pre-screening questions. He is aware to come by himself to this appointment and to wear his own mask.

## 2018-12-26 NOTE — Telephone Encounter (Addendum)
Received LINQ alert for "AF" episode on 12/25/18 at 21:32, duration 2hr 42min. Reviewed with Dr. Gerome Apley AF. Spoke with pt and wife. He reports "something happened that caused me distress" at the time of the episode. Pt sat down, allowed himself to calm down, felt anxious because he didn't feel right. Symptoms abated. Used symptom activator, but will require a manual transmission for review. Has been having issues with exertional ShOB in recent weeks, has f/u with Dr. [name] on 7/16 to discuss.   Pt reports LINQ site bruising is healed. Still feels site is protruding/swollen. Denies redness, drainage, pain, fever/chills. Reports it is unchanged since we last spoke on 12/17/18. Pt is now agreeable to in-office wound check. He does not wish to wait until 7/17 to see Dr. Curt Bears. He is agreeable to trying to coordinate a DC appointment and AF Clinic appointment tomorrow. Advised I will reach out to [facility] Clinic for appointment availability and call him back this morning. Pt is agreeable to this plan.

## 2018-12-26 NOTE — Progress Notes (Addendum)
Primary Care Physician: [name], MD Primary Cardiologist: Dr [name] Primary Electrophysiologist: Dr Curt Bears Referring Physician: Dr Annitta Jersey [name] is a 78 y.o. male with a history of paroxysmal atrial fibrillation, HTN, h/o NOD by cath in 2015, HTN, HLD, GERD, CLL, chronic thrombocytopenia, follows with Heme-Onc, who presents for follow up in [facility].  The patient was initially diagnosed with atrial fibrillation on ILR after presenting to there ER on 10/16/18 with syncope. Episode lasted a little over 2 hours. He was symptomatic with a "churrning" feeling in his chest which only lasted a few minutes. He was surprised to hear that the episode lasted two hours. He does admit to dyspnea and exercise intolerance which has been chronic.   Today, he denies symptoms of palpitations, chest pain, orthopnea, PND, lower extremity edema, dizziness, presyncope, syncope, snoring, daytime somnolence, bleeding, or neurologic sequela. The patient is tolerating medications without difficulties and is otherwise without complaint today.     he has a BMI of Body mass index is 27.46 kg/m.Marland Kitchen Filed Weights   12/27/18 0940  Weight: 70.3 kg    Family History  Problem Relation Age of Onset  . Sudden death Mother 50       possible botulism  . Dementia Father   . Seizures Sister      Atrial Fibrillation Management history:  Previous antiarrhythmic drugs: none Previous cardioversions: none Previous ablations: none CHADS2VASC score: 4 Anticoagulation history: none   Past Medical History:  Diagnosis Date  . Acute encephalopathy 08/17/2016  . Acute renal failure superimposed on stage 3 chronic kidney disease ([place]) 10/15/2018  . AKI (acute kidney injury) ([place]) 08/16/2016  . Arthritis   . Bacteremia due to Enterobacter species 08/19/2016  . Carpal tunnel syndrome, bilateral 10/09/2017  . Chest tightness 10/15/2018  . CLL (chronic lymphocytic leukemia) ([place])  12/16/2014  . Dyspnea 09/20/2012  . Elevated troponin 10/16/2018  . Enterobacter sepsis ([place]) 08/19/2016  . Fall 10/15/2018  . GERD (gastroesophageal reflux disease)   . Hepatitis 1966   Drug reaction after taking medication  . Hyperglycemia   . Hyperlipemia   . Hypertension   . Hypokalemia 08/16/2016  . Lung nodule seen on imaging study    bilateral lungs  . Nasal bone fracture 10/15/2018  . Right foot pain   . SAH (subarachnoid hemorrhage) ([place]) 10/15/2018  . Syncope 10/15/2018  . Thrombocytopenia ([place]) 10/15/2018  . Urinary tract infection with hematuria    Past Surgical History:  Procedure Laterality Date  . APPENDECTOMY    . arthroscoyp  2000   left knee  . COLONOSCOPY W/ POLYPECTOMY    . LOOP RECORDER INSERTION N/A 10/18/2018   Procedure: LOOP RECORDER INSERTION;  Surgeon: [name], MD;  Location: [facility];  Service: Cardiovascular;  Laterality: N/A;  . LUMBAR LAMINECTOMY/DECOMPRESSION MICRODISCECTOMY  06/27/2012   Procedure: LUMBAR LAMINECTOMY/DECOMPRESSION MICRODISCECTOMY 1 LEVEL;  Surgeon: [name], MD;  Location: [facility];  Service: Neurosurgery;  Laterality: Bilateral;  bilateral four-five laminectony  . SKIN CANCER EXCISION  5-6 years ago   moe-s surgery- basal b  . TONSILLECTOMY      Current Outpatient Medications  Medication Sig Dispense Refill  . bisacodyl (DULCOLAX) 5 MG EC tablet Take 5 mg by mouth daily as needed for moderate constipation.    Marland Kitchen CALQUENCE 100 MG CAPS TAKE 1 TABLET (100 MG) BY MOUTH DAILY. 30 capsule 0  . Cholecalciferol (VITAMIN D) 2000 units CAPS Take 2,000  Units by mouth daily.     . Loratadine 10 MG CAPS Take 10 mg by mouth.    . metoprolol succinate (TOPROL-XL) 25 MG 24 hr tablet Take 25 mg by mouth daily.    . simvastatin (ZOCOR) 40 MG tablet Take 20 mg by mouth daily.     . Tamsulosin HCl (FLOMAX) 0.4 MG CAPS Take 1 capsule (0.4 mg total) by mouth daily. 30 capsule 0  . EPINEPHrine (EPIPEN) 0.3 mg/0.3 mL DEVI Inject  0.3 mLs (0.3 mg total) into the muscle once. (Patient not taking: Reported on 11/05/2018) 1 Device 0   No current facility-administered medications for this encounter.     No Known Allergies  Social History   Socioeconomic History  . Marital status: Married    Spouse name: [name]   . Number of children: 2  . Years of education: PhD  . Highest education level: Not on file  Occupational History  . Occupation: Retired  Scientific laboratory technician  . Financial resource strain: Not on file  . Food insecurity    Worry: Not on file    Inability: Not on file  . Transportation needs    Medical: Not on file    Non-medical: Not on file  Tobacco Use  . Smoking status: Never Smoker  . Smokeless tobacco: Never Used  Substance and Sexual Activity  . Alcohol use: Yes    Alcohol/week: 2.0 standard drinks    Types: 2 Cans of beer per week    Comment: rare 2 beers per month  . Drug use: No  . Sexual activity: Not on file  Lifestyle  . Physical activity    Days per week: Not on file    Minutes per session: Not on file  . Stress: Not on file  Relationships  . Social Herbalist on phone: Not on file    Gets together: Not on file    Attends religious service: Not on file    Active member of club or organization: Not on file    Attends meetings of clubs or organizations: Not on file    Relationship status: Not on file  . Intimate partner violence    Fear of current or ex partner: Not on file    Emotionally abused: Not on file    Physically abused: Not on file    Forced sexual activity: Not on file  Other Topics Concern  . Not on file  Social History Narrative   Lives with wife   Caffeine use: 1 cup coffee per day   Diet coke      Right handed      ROS- All systems are reviewed and negative except as per the HPI above.  Physical Exam: Vitals:   12/27/18 0940  BP: (!) 142/74  Pulse: 63  Weight: 70.3 kg  Height: 5\' 3"  (1.6 m)    GEN- The patient is well appearing elderly  male, alert and oriented x 3 today.   Head- normocephalic, atraumatic Eyes-  Sclera clear, conjunctiva pink Ears- hearing intact Oropharynx- clear Neck- supple  Lungs- Clear to ausculation bilaterally, normal work of breathing Heart- Regular rate and rhythm, no murmurs, rubs or gallops  GI- soft, NT, ND, + BS Extremities- no clubbing, cyanosis, or edema MS- no significant deformity or atrophy Skin- no rash or lesion Psych- euthymic mood, full affect Neuro- strength and sensation are intact  Wt Readings from Last 3 Encounters:  12/27/18 70.3 kg  12/18/18 71.2 kg  11/30/18 69.4  kg    EKG today demonstrates SR HR 63, PR 150, QRS 82, QTc 413  Echo 10/16/18 demonstrated   1. The left ventricle has hyperdynamic systolic function, with an ejection fraction of >65%. The cavity size was normal. Left ventricular diastolic Doppler parameters are consistent with pseudonormal.  2. There is a dynamic mid LV gradient of 25 mmHg.  3. The right ventricle has normal systolc function. There is no increase in right ventricular wall thickness.  4. No evidence of mitral valve stenosis.  5. No stenosis of the aortic valve.  Epic records are reviewed at length today  Assessment and Plan:  1. New onset paroxysmal atrial fibrillation The patient has paroxysmal atrial fibrillation.   2 hr episode noted on ILR.  General education about afib discussed and questions answered.  We discussed possibility of starting PRN CCB or AAD if his afib should become more persistent.  Continue Toprol 25 mg daily. We also discussed his stroke risk and the risks and benefits of anticoagulation. Due to his CLL and chronic thrombocytopenia, will d/w his heme-onc physician before considering anticoagulation. Patient voices understanding and is in agreement with the plan.  Lifestyle modification was discussed and encouraged including regular physical activity and weight reduction.  This patients CHA2DS2-VASc Score and  unadjusted Ischemic Stroke Rate (% per year) is equal to 4.8 % stroke rate/year from a score of 4  Above score calculated as 1 point each if present [CHF, HTN, DM, Vascular=MI/PAD/Aortic Plaque, Age if 65-74, or Male] Above score calculated as 2 points each if present [Age > 75, or Stroke/TIA/TE]   2. CAD Chronically occluded RCA, nonobstructive disease in LAD. Continue present therapy and risk factor modification. Followed by Dr [name].  3. Syncope No reoccurrence. S/p ILR.  4. HTN Recently started on metoprolol with improvement in BP. Continue present therapy.    Follow up with Dr [name] as scheduled. Will arrange follow up pending decision about anticoagulation.   Addendum: Patient OK to start anticoagulation as long as platelets remain above 50 per heme-onc. Will start Eliquis 5 mg BID. Patient to follow up in one month.   [facility] [address] [place], [place] [address] [phone] 12/27/2018 10:19 AM

## 2018-12-27 ENCOUNTER — Ambulatory Visit (HOSPITAL_COMMUNITY)
Admission: RE | Admit: 2018-12-27 | Discharge: 2018-12-27 | Disposition: A | Discharge status: Home / Self Care | Payer: Medicare Other | Source: Ambulatory Visit | Attending: Physician Assistant | Admitting: Physician Assistant

## 2018-12-27 ENCOUNTER — Ambulatory Visit (INDEPENDENT_AMBULATORY_CARE_PROVIDER_SITE_OTHER): Payer: Medicare Other | Admitting: Student

## 2018-12-27 ENCOUNTER — Other Ambulatory Visit: Payer: Self-pay

## 2018-12-27 ENCOUNTER — Encounter (HOSPITAL_COMMUNITY): Payer: Self-pay | Admitting: Physician Assistant

## 2018-12-27 VITALS — BP 142/74 | HR 63 | Ht 63.0 in | Wt 155.0 lb

## 2018-12-27 DIAGNOSIS — I480 Paroxysmal atrial fibrillation: Secondary | ICD-10-CM

## 2018-12-27 DIAGNOSIS — N183 Chronic kidney disease, stage 3 (moderate): Secondary | ICD-10-CM | POA: Diagnosis not present

## 2018-12-27 DIAGNOSIS — K219 Gastro-esophageal reflux disease without esophagitis: Secondary | ICD-10-CM | POA: Insufficient documentation

## 2018-12-27 DIAGNOSIS — E785 Hyperlipidemia, unspecified: Secondary | ICD-10-CM | POA: Diagnosis not present

## 2018-12-27 DIAGNOSIS — Z79899 Other long term (current) drug therapy: Secondary | ICD-10-CM | POA: Insufficient documentation

## 2018-12-27 DIAGNOSIS — M1990 Unspecified osteoarthritis, unspecified site: Secondary | ICD-10-CM | POA: Diagnosis not present

## 2018-12-27 DIAGNOSIS — C9110 Chronic lymphocytic leukemia of B-cell type not having achieved remission: Secondary | ICD-10-CM | POA: Insufficient documentation

## 2018-12-27 DIAGNOSIS — R55 Syncope and collapse: Secondary | ICD-10-CM

## 2018-12-27 DIAGNOSIS — I129 Hypertensive chronic kidney disease with stage 1 through stage 4 chronic kidney disease, or unspecified chronic kidney disease: Secondary | ICD-10-CM | POA: Diagnosis not present

## 2018-12-27 DIAGNOSIS — I2510 Atherosclerotic heart disease of native coronary artery without angina pectoris: Secondary | ICD-10-CM | POA: Insufficient documentation

## 2018-12-27 DIAGNOSIS — D696 Thrombocytopenia, unspecified: Secondary | ICD-10-CM | POA: Insufficient documentation

## 2018-12-27 LAB — CUP PACEART INCLINIC DEVICE CHECK
Date Time Interrogation Session: 20200709090737
Implantable Pulse Generator Implant Date: 20200430

## 2018-12-27 NOTE — Progress Notes (Signed)
Repeat ILR wound check in clinic. Wound well healed. Pt was concerned has he could palpate the device. Home monitor transmitting nightly. Pt has confirmed Afib, longest episodes 2 hrs. Sees AF clinic this am. All questions answered. Reviewed use of symptom activator. No further questions. R waves 0.94.   Legrand Como [address] [place], PA-C 12/27/2018 9:07 AM

## 2018-12-28 ENCOUNTER — Telehealth (HOSPITAL_COMMUNITY): Payer: Self-pay | Admitting: *Deleted

## 2018-12-28 ENCOUNTER — Other Ambulatory Visit (HOSPITAL_COMMUNITY): Payer: Self-pay | Admitting: *Deleted

## 2018-12-28 NOTE — Telephone Encounter (Signed)
Called with recommendations. Patient states he does not feel comfortable with this until he discusses with DR. Sherill himself. Pt instructed to call and discuss with Dr. Learta Codding and inform of [place] decision. Pt verbalized understanding.

## 2018-12-28 NOTE — Addendum Note (Signed)
Encounter addended by: Oliver Barre, PA on: 12/28/2018 8:30 AM  Actions taken: Clinical Note Signed

## 2018-12-28 NOTE — Telephone Encounter (Signed)
-----   Message from [place], [place] sent at 12/28/2018  8:30 AM EDT ----- Regarding: anticoagulation Patient OK to start Eliquis 5 mg BID as long as platelets stay above 50,000 per Dr Learta Codding.  F/u in AF clinic in one month. I addended my note to reflect these changes. Thanks

## 2018-12-31 ENCOUNTER — Other Ambulatory Visit: Payer: Self-pay | Admitting: Oncology

## 2018-12-31 ENCOUNTER — Telehealth: Payer: Self-pay | Admitting: *Deleted

## 2018-12-31 DIAGNOSIS — C9110 Chronic lymphocytic leukemia of B-cell type not having achieved remission: Secondary | ICD-10-CM

## 2018-12-31 NOTE — Telephone Encounter (Addendum)
Patient wishes to talk with [name]: concerned about the anticoagulant he was ordered by PA w/cardiology for Eliquis 5 mg bid. Reports he feels it is "shocking" that he was not warned that the cost is $500/month for this. Asking if there is anything else that is less expensive he can take. Is he able to take coumadin? Asking if his platelets go up, will he be able to take something less expensive?  He declines to begin the Eliquis until he speaks with [name]. Per [name]: He did not discuss specific drugs with cardiology. He would be OK to take Eliquis, Xarelto or Warfarin. Warfarin does involve a lot more lab testing to monitor INR, which can be labor intensive. Cardiology will need to monitor the INR and not oncology. Patient notified and sent message to cardiology PA as well.

## 2018-12-31 NOTE — Progress Notes (Signed)
Carelink Summary Report / Loop Recorder 

## 2019-01-01 ENCOUNTER — Telehealth (HOSPITAL_COMMUNITY): Payer: Self-pay | Admitting: Physician Assistant

## 2019-01-01 ENCOUNTER — Telehealth: Payer: Self-pay | Admitting: *Deleted

## 2019-01-01 ENCOUNTER — Other Ambulatory Visit: Payer: Self-pay | Admitting: Pharmacist

## 2019-01-01 DIAGNOSIS — C9110 Chronic lymphocytic leukemia of B-cell type not having achieved remission: Secondary | ICD-10-CM

## 2019-01-01 MED ORDER — CALQUENCE 100 MG PO CAPS: 100.0000 mg | ORAL_CAPSULE | Freq: Every day | ORAL | 0 refills | Status: DC

## 2019-01-01 MED FILL — CALQUENCE 100 MG CAPSULE: 100 | 30 days supply | Qty: 30 | Fill #0

## 2019-01-01 NOTE — Telephone Encounter (Signed)
Patient called regarding questions about anticoagulation. Patient concerned about cost of DOAC medications. Discussed warfarin as an option along with INR checks, food and medication interactions. Patient would like to take time to consider his options.

## 2019-01-01 NOTE — Telephone Encounter (Signed)
Calling to request conversation with [name]: Asking for guidance and an explanation as to the safety of him being on acalabrutinib and blood thinner in regards to his platelet count. States he wishes to speak with MD and not nurse.

## 2019-01-02 ENCOUNTER — Telehealth: Payer: Self-pay | Admitting: Cardiology

## 2019-01-02 ENCOUNTER — Encounter: Payer: Self-pay | Admitting: Oncology

## 2019-01-02 ENCOUNTER — Telehealth: Payer: Self-pay | Admitting: Oncology

## 2019-01-02 ENCOUNTER — Telehealth (HOSPITAL_COMMUNITY): Payer: Self-pay | Admitting: *Deleted

## 2019-01-02 NOTE — Telephone Encounter (Signed)
Patient called and dismissed [name] Fenton, PA from his careteam. Patient states he would prefer to follow up with Dr. Curt Mathews. Will request appt with Dr. Curt Mathews for further care.

## 2019-01-02 NOTE — Telephone Encounter (Signed)
Mr. Antkowiak was noted to have atrial fibrillation on a cardiac monitor.  The cardiology service has recommended anticoagulation therapy. Mr. Marling contacted me to discuss the risk of anticoagulation therapy.  He is at increased risk for bleeding with the acalabrutinib, thrombocytopenia, and anticoagulation therapy.  He will see Dr. [name] tomorrow to discuss the need for anticoagulation therapy in the setting of atrial fibrillation.  I am hopeful his platelet count will improve over the next few months.  This would lower his bleeding risk.  The acalabrutinib is associated with bleeding risk.  He will return for a CBC as scheduled on 01/08/2019.

## 2019-01-02 NOTE — Telephone Encounter (Signed)

## 2019-01-03 ENCOUNTER — Encounter: Payer: Self-pay | Admitting: Cardiology

## 2019-01-03 ENCOUNTER — Ambulatory Visit (INDEPENDENT_AMBULATORY_CARE_PROVIDER_SITE_OTHER): Payer: Medicare Other | Admitting: Cardiology

## 2019-01-03 ENCOUNTER — Other Ambulatory Visit: Payer: Self-pay

## 2019-01-03 VITALS — BP 120/60 | HR 66 | Ht 63.0 in | Wt 155.4 lb

## 2019-01-03 DIAGNOSIS — C9110 Chronic lymphocytic leukemia of B-cell type not having achieved remission: Secondary | ICD-10-CM | POA: Diagnosis not present

## 2019-01-03 DIAGNOSIS — I2510 Atherosclerotic heart disease of native coronary artery without angina pectoris: Secondary | ICD-10-CM

## 2019-01-03 DIAGNOSIS — I480 Paroxysmal atrial fibrillation: Secondary | ICD-10-CM | POA: Diagnosis not present

## 2019-01-03 DIAGNOSIS — R0602 Shortness of breath: Secondary | ICD-10-CM | POA: Diagnosis not present

## 2019-01-03 MED ORDER — METOPROLOL SUCCINATE ER 25 MG PO TB24: 25.0000 mg | ORAL_TABLET | Freq: Two times a day (BID) | ORAL | 3 refills | Status: DC

## 2019-01-03 NOTE — Progress Notes (Signed)
Cardiology Office Note:    Date:  01/03/2019   ID:  [name], DOB [birth year], MRN [id number]  PCP:  [name] Huddle, MD  Cardiologist:  [name] Furbish, MD  Electrophysiologist:  None   Referring MD: [name] Huddle, MD   No chief complaint on file. Here with shortness of breath  History of Present Illness:    [name] is a 78 y.o. male with a hx of paroxysmal atrial fibrillation hypertension chronic thrombocytopenia followed by hematology with implantable loop recorder after syncope.  This was in April 2020.  Atrial fibrillation felt as though a churning feeling lasting a few minutes.  He was previously surprised to know that his episode lasted for 2 hours.  Has been seen in the atrial fibrillation clinic.  CT of coronary showed - ADDENDUM: CT FFR was done.  1.  Distal RCA occluded.  2.  LAD disease does not appear significant (FFR 0.84 mid LAD).  3.  No significant LCx disease.  [name]  Been feeling constant dyspnea. Not gasping. Could be anxiety. Lunch with 4 friends in back yard. Felt well, comfortable. In other situations feels differently. Exercise intolerance. 5 min of walking not too steep, laboring with this. No orthopnea. Has gotten worse. [name] is wife.     Past Medical History:  Diagnosis Date  . Acute encephalopathy 08/17/2016  . Acute renal failure superimposed on stage 3 chronic kidney disease ([place]) 10/15/2018  . AKI (acute kidney injury) ([place]) 08/16/2016  . Arthritis   . Bacteremia due to Enterobacter species 08/19/2016  . Carpal tunnel syndrome, bilateral 10/09/2017  . Chest tightness 10/15/2018  . CLL (chronic lymphocytic leukemia) ([place]) 12/16/2014  . Dyspnea 09/20/2012  . Elevated troponin 10/16/2018  . Enterobacter sepsis ([place]) 08/19/2016  . Fall 10/15/2018  . GERD (gastroesophageal reflux disease)   . Hepatitis 1966   Drug reaction after taking medication  . Hyperglycemia   . Hyperlipemia   . Hypertension   . Hypokalemia 08/16/2016  .  Lung nodule seen on imaging study    bilateral lungs  . Nasal bone fracture 10/15/2018  . Right foot pain   . SAH (subarachnoid hemorrhage) ([place]) 10/15/2018  . Syncope 10/15/2018  . Thrombocytopenia ([place]) 10/15/2018  . Urinary tract infection with hematuria     Past Surgical History:  Procedure Laterality Date  . APPENDECTOMY    . arthroscoyp  2000   left knee  . COLONOSCOPY W/ POLYPECTOMY    . LOOP RECORDER INSERTION N/A 10/18/2018   Procedure: LOOP RECORDER INSERTION;  Surgeon: [name], MD;  Location: [facility];  Service: Cardiovascular;  Laterality: N/A;  . LUMBAR LAMINECTOMY/DECOMPRESSION MICRODISCECTOMY  06/27/2012   Procedure: LUMBAR LAMINECTOMY/DECOMPRESSION MICRODISCECTOMY 1 LEVEL;  Surgeon: [name], MD;  Location: [facility];  Service: Neurosurgery;  Laterality: Bilateral;  bilateral four-five laminectony  . SKIN CANCER EXCISION  5-6 years ago   moe-s surgery- basal b  . TONSILLECTOMY      Current Medications: Current Meds  Medication Sig  . Acalabrutinib (CALQUENCE) 100 MG CAPS Take 100 mg by mouth daily.  . Cholecalciferol (VITAMIN D) 2000 units CAPS Take 2,000 Units by mouth daily.   Marland Kitchen EPINEPHrine (EPIPEN) 0.3 mg/0.3 mL DEVI Inject 0.3 mLs (0.3 mg total) into the muscle once.  . Loratadine 10 MG CAPS Take 10 mg by mouth.  . metoprolol succinate (TOPROL-XL) 25 MG 24 hr tablet Take 1 tablet (25 mg total) by mouth 2 (two) times a day.  . simvastatin (  ZOCOR) 40 MG tablet Take 20 mg by mouth daily.   . Tamsulosin HCl (FLOMAX) 0.4 MG CAPS Take 1 capsule (0.4 mg total) by mouth daily.  . [DISCONTINUED] metoprolol succinate (TOPROL-XL) 25 MG 24 hr tablet Take 25 mg by mouth daily.     Allergies:   Patient has no known allergies.   Social History   Socioeconomic History  . Marital status: Married    Spouse name: [name]   . Number of children: 2  . Years of education: PhD  . Highest education level: Not on file  Occupational History  .  Occupation: Retired  Scientific laboratory technician  . Financial resource strain: Not on file  . Food insecurity    Worry: Not on file    Inability: Not on file  . Transportation needs    Medical: Not on file    Non-medical: Not on file  Tobacco Use  . Smoking status: Never Smoker  . Smokeless tobacco: Never Used  Substance and Sexual Activity  . Alcohol use: Yes    Alcohol/week: 2.0 standard drinks    Types: 2 Cans of beer per week    Comment: rare 2 beers per month  . Drug use: No  . Sexual activity: Not on file  Lifestyle  . Physical activity    Days per week: Not on file    Minutes per session: Not on file  . Stress: Not on file  Relationships  . Social Herbalist on phone: Not on file    Gets together: Not on file    Attends religious service: Not on file    Active member of club or organization: Not on file    Attends meetings of clubs or organizations: Not on file    Relationship status: Not on file  Other Topics Concern  . Not on file  Social History Narrative   Lives with wife   Caffeine use: 1 cup coffee per day   Diet coke      Right handed      Family History: The patient's family history includes Dementia in his father; Seizures in his sister; Sudden death (age of onset: 74) in his mother.  ROS:   Please see the history of present illness.     All other systems reviewed and are negative.  EKGs/Labs/Other Studies Reviewed:    The following studies were reviewed today:  ECHO 09/2018   1. The left ventricle has hyperdynamic systolic function, with an ejection fraction of >65%. The cavity size was normal. Left ventricular diastolic Doppler parameters are consistent with pseudonormal.  2. There is a dynamic mid LV gradient of 25 mmHg.  3. The right ventricle has normal systolc function. There is no increase in right ventricular wall thickness.  4. No evidence of mitral valve stenosis.  5. No stenosis of the aortic valve.  ILR- NSR  CTA chest on 10/08/2018  was without PE.  CT of coronary ADDENDUM: CT FFR was done.  1.  Distal RCA occluded.  2.  LAD disease does not appear significant (FFR 0.84 mid LAD).  3.  No significant LCx disease.  [name]  EKG:  EKG is not ordered today.    Recent Labs: 12/18/2018: ALT 12; BUN 27; Creatinine 1.38; Hemoglobin 11.9; Platelet Count 58; Potassium 4.3; Sodium 139  Recent Lipid Panel    Component Value Date/Time   CHOL 106 10/16/2018 0339   TRIG 125 10/16/2018 0339   HDL 20 (L) 10/16/2018 8768  CHOLHDL 5.3 10/16/2018 0339   VLDL 25 10/16/2018 0339   LDLCALC 61 10/16/2018 0339    Physical Exam:    VS:  BP 120/60   Pulse 66   Ht 5' 3"  (1.6 m)   Wt 155 lb 6.4 oz (70.5 kg)   SpO2 97%   BMI 27.53 kg/m     Wt Readings from Last 3 Encounters:  01/03/19 155 lb 6.4 oz (70.5 kg)  12/27/18 155 lb (70.3 kg)  12/18/18 157 lb (71.2 kg)     GEN:  Well nourished, well developed in no acute distress HEENT: Normal NECK: No JVD; No carotid bruits LYMPHATICS: No lymphadenopathy CARDIAC: RRR, no murmurs, rubs, gallops RESPIRATORY:  Clear to auscultation without rales, wheezing or rhonchi  ABDOMEN: Soft, non-tender, non-distended MUSCULOSKELETAL:  No edema; No deformity  SKIN: Warm and dry NEUROLOGIC:  Alert and oriented x 3 PSYCHIATRIC:  Normal affect, mildly anxious  ASSESSMENT:    1. Coronary artery disease involving native coronary artery of native heart without angina pectoris   2. Shortness of breath   3. Paroxysmal atrial fibrillation (HCC)   4. CLL (chronic lymphocytic leukemia) (HCC)    PLAN:    In order of problems listed above:  Paroxysmal atrial fibrillation  - Eliquis discussed with [name].  Given his platelet function, risk of bleeding possibly outweighed benefit.  His stroke risk is 4.8 %/year given his chads vasc score of 4.  He understands and takes for sensibility for this.  His wife was also present on the phone.  Appreciate visit in atrial fibrillation  clinic.  He stated that he was upset when he tried to get the prescription of Eliquis and it was $400.  CAD  -CT scan of coronary arteries reviewed.  Also CT scan of chest reviewed-no PE.  -It appears that the distal segment of the RCA is likely chronically occluded now.  On prior cardiac catheterization it was quite narrow distally small in caliber.  I discussed with him the myriad of options including having him sit down with our interventional team to see if opening up this RCA would make sense however this would require Plavix, antiplatelet and given his platelet function issues/CLL risks would likely outweigh benefits. - I think it makes sense for [place] right now to continue with medical management.  I will increase his Toprol-XL to 25 mg twice a day instead of once a day.  Hopefully this will help with relaxation of his heart as well given his hyperdynamic echocardiogram.  This is a good first step into helping him with possible anginal equivalent.  Interestingly, he did have similar symptoms back in 2014 during heart catheterization.  They may have progressed however. -Next step would be to try to add isosorbide 30 mg a day. -LAD FFR was 0.84. - Given his underlying coronary artery disease there is always risk of possible MI.  Both he and his wife are aware.  I would like for him to continue to work on exercise efforts.  Instructions have been given to slow down if anginal symptoms are observed. -I have also given him a pulmonary referral.  His wife, on the phone, was happy about this as well.  CLL/thrombocytopenia - Platelet count up and down.  Appreciate Dr. Ashok Cordia assistance.  A total of 40 minutes spent with visit, preparation of chart review of scans etc.   Medication Adjustments/Labs and Tests Ordered: Current medicines are reviewed at length with the patient today.  Concerns regarding medicines are outlined  above.  Orders Placed This Encounter  Procedures  . Ambulatory referral to  Pulmonology   Meds ordered this encounter  Medications  . metoprolol succinate (TOPROL-XL) 25 MG 24 hr tablet    Sig: Take 1 tablet (25 mg total) by mouth 2 (two) times a day.    Dispense:  180 tablet    Refill:  3    Dose increase discontinue previous RX for QD    Patient Instructions  Medication Instructions:  Please increase your Metoprolol to twice a day.  If you need a refill on your cardiac medications before your next appointment, please call your pharmacy.   You have been referred to Pulmonary for the evaluation of shortness of breath.  Follow-Up: Follow up in 2 months with Dr [name].  Thank you for choosing [facility]!!          Signed, [name] Furbish, MD  01/03/2019 12:44 PM    [place]

## 2019-01-03 NOTE — Patient Instructions (Signed)
Medication Instructions:  Please increase your Metoprolol to twice a day.  If you need a refill on your cardiac medications before your next appointment, please call your pharmacy.   You have been referred to Pulmonary for the evaluation of shortness of breath.  Follow-Up: Follow up in 2 months with Dr [name].  Thank you for choosing [facility]!!

## 2019-01-07 ENCOUNTER — Telehealth: Payer: Self-pay | Admitting: *Deleted

## 2019-01-07 NOTE — Telephone Encounter (Signed)
Called and left VM reporting he and Dr. [name] decided not to purse blood thinner at this time after discussion was told his stroke risk is 4.5%. He will reconsider if his platelet count gets better consistently. Cardiologist "doubled my BP pill in the meantime".

## 2019-01-08 ENCOUNTER — Inpatient Hospital Stay: Payer: Medicare Other | Attending: Oncology

## 2019-01-08 ENCOUNTER — Other Ambulatory Visit: Payer: Self-pay

## 2019-01-08 DIAGNOSIS — C9110 Chronic lymphocytic leukemia of B-cell type not having achieved remission: Secondary | ICD-10-CM | POA: Diagnosis not present

## 2019-01-08 LAB — CBC WITH DIFFERENTIAL (CANCER CENTER ONLY)
Abs Immature Granulocytes: 0.21 10*3/uL — ABNORMAL HIGH (ref 0.00–0.07)
Basophils Absolute: 0.1 10*3/uL (ref 0.0–0.1)
Basophils Relative: 0 %
Eosinophils Absolute: 0.3 10*3/uL (ref 0.0–0.5)
Eosinophils Relative: 0 %
HCT: 42.3 % (ref 39.0–52.0)
Hemoglobin: 12.8 g/dL — ABNORMAL LOW (ref 13.0–17.0)
Immature Granulocytes: 0 %
Lymphocytes Relative: 88 %
Lymphs Abs: 68.5 10*3/uL — ABNORMAL HIGH (ref 0.7–4.0)
MCH: 29.4 pg (ref 26.0–34.0)
MCHC: 30.3 g/dL (ref 30.0–36.0)
MCV: 97.2 fL (ref 80.0–100.0)
Monocytes Absolute: 1.1 10*3/uL — ABNORMAL HIGH (ref 0.1–1.0)
Monocytes Relative: 1 %
Neutro Abs: 8.3 10*3/uL — ABNORMAL HIGH (ref 1.7–7.7)
Neutrophils Relative %: 11 %
Platelet Count: 58 10*3/uL — ABNORMAL LOW (ref 150–400)
RBC: 4.35 MIL/uL (ref 4.22–5.81)
RDW: 13.5 % (ref 11.5–15.5)
WBC Count: 78.6 10*3/uL (ref 4.0–10.5)
nRBC: 0 % (ref 0.0–0.2)

## 2019-01-18 ENCOUNTER — Telehealth: Payer: Self-pay | Admitting: Cardiology

## 2019-01-18 NOTE — Telephone Encounter (Signed)
New message   Patient states that he is wanting to get a referral for a pulmonologist. Please advise.

## 2019-01-18 NOTE — Telephone Encounter (Signed)
Attempted to return pt's call. The phone would pick up and then hang up. Sent a [employer].

## 2019-01-25 ENCOUNTER — Ambulatory Visit (INDEPENDENT_AMBULATORY_CARE_PROVIDER_SITE_OTHER): Payer: Medicare Other | Admitting: *Deleted

## 2019-01-25 DIAGNOSIS — R55 Syncope and collapse: Secondary | ICD-10-CM | POA: Diagnosis not present

## 2019-01-25 LAB — CUP PACEART REMOTE DEVICE CHECK
Date Time Interrogation Session: 20200807130414
Implantable Pulse Generator Implant Date: 20200430

## 2019-01-28 ENCOUNTER — Telehealth: Payer: Self-pay | Admitting: Emergency Medicine

## 2019-01-28 NOTE — Telephone Encounter (Signed)
Dr Curt Bears requested pt be scheduled for appointment to discuss AF on LINQ recording. Pt hreports he has not been notified of event by DC, Transmission in [facility] viewed and pt informed that AF event

## 2019-01-29 ENCOUNTER — Other Ambulatory Visit: Payer: Self-pay

## 2019-01-29 ENCOUNTER — Telehealth: Payer: Self-pay | Admitting: Oncology

## 2019-01-29 ENCOUNTER — Inpatient Hospital Stay: Payer: Medicare Other | Attending: Oncology | Admitting: Oncology

## 2019-01-29 ENCOUNTER — Inpatient Hospital Stay: Payer: Medicare Other

## 2019-01-29 VITALS — BP 141/68 | HR 59 | Temp 97.8°F | Resp 17 | Ht 63.0 in | Wt 157.0 lb

## 2019-01-29 DIAGNOSIS — D696 Thrombocytopenia, unspecified: Secondary | ICD-10-CM | POA: Insufficient documentation

## 2019-01-29 DIAGNOSIS — R21 Rash and other nonspecific skin eruption: Secondary | ICD-10-CM | POA: Diagnosis not present

## 2019-01-29 DIAGNOSIS — I480 Paroxysmal atrial fibrillation: Secondary | ICD-10-CM | POA: Insufficient documentation

## 2019-01-29 DIAGNOSIS — C9110 Chronic lymphocytic leukemia of B-cell type not having achieved remission: Secondary | ICD-10-CM

## 2019-01-29 LAB — CMP (CANCER CENTER ONLY)
ALT: 14 U/L (ref 0–44)
AST: 14 U/L — ABNORMAL LOW (ref 15–41)
Albumin: 4.5 g/dL (ref 3.5–5.0)
Alkaline Phosphatase: 70 U/L (ref 38–126)
Anion gap: 6 (ref 5–15)
BUN: 32 mg/dL — ABNORMAL HIGH (ref 8–23)
CO2: 25 mmol/L (ref 22–32)
Calcium: 9 mg/dL (ref 8.9–10.3)
Chloride: 107 mmol/L (ref 98–111)
Creatinine: 1.41 mg/dL — ABNORMAL HIGH (ref 0.61–1.24)
GFR, Est AFR Am: 55 mL/min — ABNORMAL LOW (ref >60)
GFR, Estimated: 48 mL/min — ABNORMAL LOW (ref >60)
Glucose, Bld: 97 mg/dL (ref 70–99)
Potassium: 4.2 mmol/L (ref 3.5–5.1)
Sodium: 138 mmol/L (ref 135–145)
Total Bilirubin: 0.7 mg/dL (ref 0.3–1.2)
Total Protein: 6.7 g/dL (ref 6.5–8.1)

## 2019-01-29 LAB — CBC WITH DIFFERENTIAL (CANCER CENTER ONLY)
Abs Immature Granulocytes: 0.11 10*3/uL — ABNORMAL HIGH (ref 0.00–0.07)
Basophils Absolute: 0.1 10*3/uL (ref 0.0–0.1)
Basophils Relative: 0 %
Eosinophils Absolute: 0.2 10*3/uL (ref 0.0–0.5)
Eosinophils Relative: 0 %
HCT: 41.8 % (ref 39.0–52.0)
Hemoglobin: 13.2 g/dL (ref 13.0–17.0)
Immature Granulocytes: 0 %
Lymphocytes Relative: 84 %
Lymphs Abs: 41.4 10*3/uL — ABNORMAL HIGH (ref 0.7–4.0)
MCH: 30 pg (ref 26.0–34.0)
MCHC: 31.6 g/dL (ref 30.0–36.0)
MCV: 95 fL (ref 80.0–100.0)
Monocytes Absolute: 0.9 10*3/uL (ref 0.1–1.0)
Monocytes Relative: 2 %
Neutro Abs: 6.8 10*3/uL (ref 1.7–7.7)
Neutrophils Relative %: 14 %
Platelet Count: 72 10*3/uL — ABNORMAL LOW (ref 150–400)
RBC: 4.4 MIL/uL (ref 4.22–5.81)
RDW: 13.2 % (ref 11.5–15.5)
WBC Count: 49.5 10*3/uL — ABNORMAL HIGH (ref 4.0–10.5)
nRBC: 0 % (ref 0.0–0.2)

## 2019-01-29 NOTE — Progress Notes (Signed)
Carelink Summary Report / Loop Recorder 

## 2019-01-29 NOTE — Progress Notes (Signed)
[place] OFFICE PROGRESS NOTE   Diagnosis: CLL, thrombocytopenia  INTERVAL HISTORY:   [name] returns as scheduled.  He continues acalabrutinib.  He has a rash at the parietal scalp.  No bleeding.  No diarrhea.  He has stable exertional dyspnea.  Objective:  Vital signs in last 24 hours:  Blood pressure (!) 141/68, pulse (!) 59, temperature 97.8 F (36.6 C), temperature source Oral, resp. rate 17, height _0  (1.6 m), weight 157 lb (71.2 kg), SpO2 99 %.    HEENT: Neck without mass Lymphatics: No cervical, supraclavicular, or inguinal nodes.  Less than 1 cm bilateral axillary nodes. Resp: Lungs clear bilaterally Cardio: Regular rate and rhythm GI: No hepatosplenomegaly Vascular: No leg edema  Skin: Faint erythematous rash to mid back, plaque of hyperpigmentation at the parietal scalp    Lab Results:  Lab Results  Component Value Date   WBC 49.5 (H) 01/29/2019   HGB 13.2 01/29/2019   HCT 41.8 01/29/2019   MCV 95.0 01/29/2019   PLT 72 (L) 01/29/2019   NEUTROABS 6.8 01/29/2019    CMP  Lab Results  Component Value Date   NA 139 12/18/2018   K 4.3 12/18/2018   CL 106 12/18/2018   CO2 24 12/18/2018   GLUCOSE 77 12/18/2018   BUN 27 (H) 12/18/2018   CREATININE 1.38 (H) 12/18/2018   CALCIUM 8.7 (L) 12/18/2018   PROT 6.3 (L) 12/18/2018   ALBUMIN 4.1 12/18/2018   AST 13 (L) 12/18/2018   ALT 12 12/18/2018   ALKPHOS 70 12/18/2018   BILITOT 0.3 12/18/2018   GFRNONAA 49 (L) 12/18/2018   GFRAA 57 (L) 12/18/2018     Medications: I have reviewed the patient's current medications.   Assessment/Plan: 1. CLL ? Review of the peripheral blood smear is consistent with chronic lymphocytic leukemia ? 12/16/2014 Peripheral blood flow cytometry consistent with CLL ? 10/16/2018 MRI brain and cervical spine - Prominent bulky cervical adenopathy within the visualized neck,compatible with history of CLL. ? FISH analysis- gain of 13q; 17p and 11q not  detected ? Acalabrutinib 11/22/2018 2.thrombocytopenia-likely secondary to chronic lymphocytic leukemia versus "pseudo thrombocytopenia"due to platelet clumping; progressive thrombocytopenia June 2018  Trial of prednisone started 10/17/2018 after admission with a fall/subarachnoid hemorrhage-starting dose 60 mg daily  Prednisone tapered to 40 mg daily beginning 10/22/2018  Prednisone taper to 30 mg daily beginning 10/29/2018, tapered to off over 6 days beginning 11/13/2018  Bone marrow biopsy 11/09/2018-hypercellular marrow with prominent involvement by CLL, abundant megakaryocytes, lymphocytes represent 82% of all cells, the cytogenetics returned normal 3.Hospitalization with sepsis/UTI with bacteremia (Enterobacter aerogenes)March 2018 4. Hospitalization for syncopal episode resulting in fall. Developed a small right subarachnoid hemorrhage.  5. MRI brain 10/16/2018 - Focal 1-2 cm osseous lesions involving the right parietal and occipital calvarium as above, indeterminate. Findings are of uncertain significance, with no definite corresponding osseous lesion seen on prior CT. 6.  History of idiopathic "anaphylactoid "reactions 7.  Paroxysmal atrial fibrillation atrial fibrillation noted on a cardiac monitor 2020, not placed on anticoagulation secondary to thrombocytopenia      Disposition: [name] appears stable.  The rash over the scalp and back may be related to the acalabrutinib.  He will contact me if the rash worsens.  The lymphocytosis and thrombocytopenia have improved.  He will continue acalabrutinib at the current dose.  He will return for a lab visit in 1 month and an office visit in approximately 7 weeks.  15 minutes were spent with the patient today.  The majority of the time was used for counseling and coordination of care.  Betsy Coder, MD  01/29/2019  9:53 AM

## 2019-01-29 NOTE — Telephone Encounter (Signed)
Scheduled appt per 8/11 los - gave patient AVS and calender per los.   

## 2019-02-06 ENCOUNTER — Other Ambulatory Visit: Payer: Self-pay | Admitting: Oncology

## 2019-02-06 DIAGNOSIS — C9110 Chronic lymphocytic leukemia of B-cell type not having achieved remission: Secondary | ICD-10-CM

## 2019-02-12 ENCOUNTER — Telehealth: Payer: Self-pay | Admitting: *Deleted

## 2019-02-12 NOTE — Telephone Encounter (Addendum)
Sees Dr. Curt Bears in follow up tomorrow for his atrial fibrillation. Feels he will want to start him on anticoaguation med. Last platelet count =72. Is this high enough to begin this? IF not, what number is [name] looking for? Per [name]: Ideally wants platelets > 100 tor anticoagulation. Patient notified on 02/13/19

## 2019-02-14 ENCOUNTER — Telehealth: Payer: Self-pay | Admitting: Oncology

## 2019-02-14 MED FILL — CALQUENCE 100 MG CAPSULE: 100 | 30 days supply | Qty: 30 | Fill #0

## 2019-02-14 NOTE — Telephone Encounter (Signed)
Returned patient's phone call regarding rescheduling 09/30 appointment, patient has requested it to move to 09/28.

## 2019-02-15 ENCOUNTER — Other Ambulatory Visit: Payer: Self-pay

## 2019-02-15 ENCOUNTER — Encounter: Payer: Self-pay | Admitting: Cardiology

## 2019-02-15 ENCOUNTER — Ambulatory Visit (INDEPENDENT_AMBULATORY_CARE_PROVIDER_SITE_OTHER): Payer: Medicare Other | Admitting: Cardiology

## 2019-02-15 ENCOUNTER — Telehealth: Payer: Self-pay | Admitting: Oncology

## 2019-02-15 VITALS — BP 130/80 | HR 57 | Ht 63.0 in | Wt 158.0 lb

## 2019-02-15 DIAGNOSIS — I480 Paroxysmal atrial fibrillation: Secondary | ICD-10-CM | POA: Diagnosis not present

## 2019-02-15 NOTE — Telephone Encounter (Signed)
Returned patient's phone call regarding voicemail that was left, spoke with patient's wife and she will inform him about upcoming appointments.

## 2019-02-15 NOTE — Progress Notes (Signed)
Electrophysiology Office Note   Date:  02/15/2019   ID:  [name], Mathews 17-Mar-1941, MRN [id number]  PCP:  [name] Huddle, MD  Cardiologist:  [name] Primary Electrophysiologist:  [name] [name] Leeds, MD    No chief complaint on file.    History of Present Illness: [name] is a 78 y.o. male who is being seen today for the evaluation of syncope at the request of [name] Huddle, MD. Presenting today for electrophysiology evaluation.  History of hypertension, nonobstructive coronary artery disease, hyperlipidemia, GERD, CLL, chronic thrombocytopenia.  He had an episode of syncope associated with arachnoid hemorrhage and is now status post Linq monitor implant.  Today, he denies symptoms of palpitations, chest pain, shortness of breath, orthopnea, PND, lower extremity edema, claudication, dizziness, presyncope, syncope, bleeding, or neurologic sequela. The patient is tolerating medications without difficulties.    Past Medical History:  Diagnosis Date  . Acute encephalopathy 08/17/2016  . Acute renal failure superimposed on stage 3 chronic kidney disease ([place]) 10/15/2018  . AKI (acute kidney injury) ([place]) 08/16/2016  . Arthritis   . Bacteremia due to Enterobacter species 08/19/2016  . Carpal tunnel syndrome, bilateral 10/09/2017  . Chest tightness 10/15/2018  . CLL (chronic lymphocytic leukemia) ([place]) 12/16/2014  . Dyspnea 09/20/2012  . Elevated troponin 10/16/2018  . Enterobacter sepsis ([place]) 08/19/2016  . Fall 10/15/2018  . GERD (gastroesophageal reflux disease)   . Hepatitis 1966   Drug reaction after taking medication  . Hyperglycemia   . Hyperlipemia   . Hypertension   . Hypokalemia 08/16/2016  . Lung nodule seen on imaging study    bilateral lungs  . Nasal bone fracture 10/15/2018  . Right foot pain   . SAH (subarachnoid hemorrhage) ([place]) 10/15/2018  . Syncope 10/15/2018  . Thrombocytopenia ([place]) 10/15/2018  . Urinary tract infection with hematuria    Past Surgical  History:  Procedure Laterality Date  . APPENDECTOMY    . arthroscoyp  2000   left knee  . COLONOSCOPY W/ POLYPECTOMY    . LOOP RECORDER INSERTION N/A 10/18/2018   Procedure: LOOP RECORDER INSERTION;  Surgeon: [name], MD;  Location: [facility];  Service: Cardiovascular;  Laterality: N/A;  . LUMBAR LAMINECTOMY/DECOMPRESSION MICRODISCECTOMY  06/27/2012   Procedure: LUMBAR LAMINECTOMY/DECOMPRESSION MICRODISCECTOMY 1 LEVEL;  Surgeon: [name], MD;  Location: [facility];  Service: Neurosurgery;  Laterality: Bilateral;  bilateral four-five laminectony  . SKIN CANCER EXCISION  5-6 years ago   moe-s surgery- basal b  . TONSILLECTOMY       Current Outpatient Medications  Medication Sig Dispense Refill  . CALQUENCE 100 MG CAPS TAKE 1 TABLET (100 MG) BY MOUTH DAILY. 30 capsule 0  . Cholecalciferol (VITAMIN D) 2000 units CAPS Take 2,000 Units by mouth daily.     Marland Kitchen EPINEPHrine (EPIPEN) 0.3 mg/0.3 mL DEVI Inject 0.3 mLs (0.3 mg total) into the muscle once. 1 Device 0  . Loratadine 10 MG CAPS Take 10 mg by mouth.    . metoprolol succinate (TOPROL-XL) 25 MG 24 hr tablet Take 1 tablet (25 mg total) by mouth 2 (two) times a day. 180 tablet 3  . simvastatin (ZOCOR) 40 MG tablet Take 20 mg by mouth daily.     . Tamsulosin HCl (FLOMAX) 0.4 MG CAPS Take 1 capsule (0.4 mg total) by mouth daily. 30 capsule 0   No current facility-administered medications for this visit.     Allergies:   Patient has no known allergies.   Social  History:  The patient  reports that he has never smoked. He has never used smokeless tobacco. He reports current alcohol use of about 2.0 standard drinks of alcohol per week. He reports that he does not use drugs.   Family History:  The patient's family history includes Dementia in his father; Seizures in his sister; Sudden death (age of onset: 2) in his mother.    ROS:  Please see the history of present illness.   Otherwise, review of systems is positive  for none   All other systems are reviewed and negative.    PHYSICAL EXAM: VS:  BP 130/80   Pulse (!) 57   Ht 5\' 3"  (1.6 m)   Wt 158 lb (71.7 kg)   SpO2 99%   BMI 27.99 kg/m  , BMI Body mass index is 27.99 kg/m. GEN: Well nourished, well developed, in no acute distress  HEENT: normal  Neck: no JVD, carotid bruits, or masses Cardiac: RRR; no murmurs, rubs, or gallops,no edema  Respiratory:  clear to auscultation bilaterally, normal work of breathing GI: soft, nontender, nondistended, + BS MS: no deformity or atrophy  Skin: warm and dry, device pocket is well healed Neuro:  Strength and sensation are intact Psych: euthymic mood, full affect  EKG:  EKG is ordered today. Personal review of the ekg ordered shows SR, rate 57  Device interrogation is reviewed today in detail.  See PaceArt for details.   Recent Labs: 01/29/2019: ALT 14; BUN 32; Creatinine 1.41; Hemoglobin 13.2; Platelet Count 72; Potassium 4.2; Sodium 138    Lipid Panel     Component Value Date/Time   CHOL 106 10/16/2018 0339   TRIG 125 10/16/2018 0339   HDL 20 (L) 10/16/2018 0339   CHOLHDL 5.3 10/16/2018 0339   VLDL 25 10/16/2018 0339   LDLCALC 61 10/16/2018 0339     Wt Readings from Last 3 Encounters:  02/15/19 158 lb (71.7 kg)  01/29/19 157 lb (71.2 kg)  01/03/19 155 lb 6.4 oz (70.5 kg)      Other studies Reviewed: Additional studies/ records that were reviewed today include: TTE 10/16/18  Review of the above records today demonstrates:   1. The left ventricle has hyperdynamic systolic function, with an ejection fraction of >65%. The cavity size was normal. Left ventricular diastolic Doppler parameters are consistent with pseudonormal.  2. There is a dynamic mid LV gradient of 25 mmHg.  3. The right ventricle has normal systolc function. There is no increase in right ventricular wall thickness.  4. No evidence of mitral valve stenosis.  5. No stenosis of the aortic valve.   ASSESSMENT AND PLAN:   1.  Syncope: Status post Linq monitor implant.  Further episodes of syncope.  No changes.  2.  Coronary artery disease: Is having some shortness of breath, but it does not appear that he has having any chest pain.  He was recommended to start Imdur.  He has not wanted to start this.  No changes.  3. Paroxysmal atrial fibrillation: Currently not anticoagulated as he has had one episode.  His platelets are also low and hematology feels that this should be avoided.  No changes.  This patients CHA2DS2-VASc Score and unadjusted Ischemic Stroke Rate (% per year) is equal to 4.8 % stroke rate/year from a score of 4  Above score calculated as 1 point each if present [CHF, HTN, DM, Vascular=MI/PAD/Aortic Plaque, Age if 65-74, or Male] Above score calculated as 2 points each if present [Age > 75,  or Stroke/TIA/TE]   Current medicines are reviewed at length with the patient today.   The patient does not have concerns regarding his medicines.  The following changes were made today:  none  Labs/ tests ordered today include:  No orders of the defined types were placed in this encounter.    Disposition:   FU with Aramis Zobel 1 year  Signed, Kambrea Carrasco [name] Leeds, MD  02/15/2019 4:13 PM     [place] [address] [phone] (office) [phone] (fax)

## 2019-02-21 ENCOUNTER — Telehealth: Payer: Self-pay | Admitting: *Deleted

## 2019-02-21 NOTE — Telephone Encounter (Signed)
Called to ask if taking ASA 81 mg would help him since his platelets are too low to take anticoagulant med? Per [name]: Doubtful it will benefit him, but needs to ask cardiologist.

## 2019-02-27 ENCOUNTER — Other Ambulatory Visit: Payer: Self-pay

## 2019-02-27 ENCOUNTER — Ambulatory Visit (INDEPENDENT_AMBULATORY_CARE_PROVIDER_SITE_OTHER): Payer: Medicare Other | Admitting: *Deleted

## 2019-02-27 ENCOUNTER — Inpatient Hospital Stay: Payer: Medicare Other | Attending: Oncology

## 2019-02-27 DIAGNOSIS — R55 Syncope and collapse: Secondary | ICD-10-CM | POA: Diagnosis not present

## 2019-02-27 DIAGNOSIS — D696 Thrombocytopenia, unspecified: Secondary | ICD-10-CM | POA: Insufficient documentation

## 2019-02-27 DIAGNOSIS — Z79899 Other long term (current) drug therapy: Secondary | ICD-10-CM | POA: Insufficient documentation

## 2019-02-27 DIAGNOSIS — I480 Paroxysmal atrial fibrillation: Secondary | ICD-10-CM | POA: Diagnosis not present

## 2019-02-27 DIAGNOSIS — C9110 Chronic lymphocytic leukemia of B-cell type not having achieved remission: Secondary | ICD-10-CM | POA: Insufficient documentation

## 2019-02-27 LAB — CBC WITH DIFFERENTIAL (CANCER CENTER ONLY)
Basophils Absolute: 0 10*3/uL (ref 0.0–0.1)
Basophils Relative: 0 %
Eosinophils Absolute: 0.2 10*3/uL (ref 0.0–0.5)
Eosinophils Relative: 1 %
HCT: 43.9 % (ref 39.0–52.0)
Hemoglobin: 13.7 g/dL (ref 13.0–17.0)
Lymphocytes Relative: 84 %
Lymphs Abs: 35.8 10*3/uL — ABNORMAL HIGH (ref 0.7–4.0)
MCH: 29.7 pg (ref 26.0–34.0)
MCHC: 31.2 g/dL (ref 30.0–36.0)
MCV: 95 fL (ref 80.0–100.0)
Monocytes Absolute: 0.9 10*3/uL (ref 0.1–1.0)
Monocytes Relative: 2 %
Neutro Abs: 5.8 10*3/uL (ref 1.7–7.7)
Neutrophils Relative %: 14 %
Platelet Count: 88 10*3/uL — ABNORMAL LOW (ref 150–400)
RBC: 4.62 MIL/uL (ref 4.22–5.81)
RDW: 13.1 % (ref 11.5–15.5)
WBC Count: 42.8 10*3/uL — ABNORMAL HIGH (ref 4.0–10.5)
nRBC: 0 % (ref 0.0–0.2)

## 2019-02-27 LAB — CUP PACEART REMOTE DEVICE CHECK
Date Time Interrogation Session: 20200909161224
Implantable Pulse Generator Implant Date: 20200430

## 2019-02-28 ENCOUNTER — Telehealth: Payer: Self-pay

## 2019-02-28 NOTE — Telephone Encounter (Signed)
TC to pt per [name] to let him know that the platelet count and lymphocytosis continue to improve, continue acalabrutinib at current dose, and follow-up as scheduled. Patient verbalized understanding. No further problems or concerns at this time.

## 2019-03-01 ENCOUNTER — Telehealth: Payer: Self-pay | Admitting: Cardiology

## 2019-03-01 NOTE — Telephone Encounter (Signed)
Spoke with patient. Advised monitor may have been doing a software update. Assisted with manual transmission to confirm monitor is working--transmission successful. All questions answered. Pt aware to call back for any new concerns.

## 2019-03-01 NOTE — Telephone Encounter (Signed)
New message   Patient has a beeping in device. Please call to discuss.

## 2019-03-11 ENCOUNTER — Telehealth: Payer: Self-pay | Admitting: *Deleted

## 2019-03-11 ENCOUNTER — Other Ambulatory Visit: Payer: Self-pay | Admitting: Oncology

## 2019-03-11 DIAGNOSIS — C9110 Chronic lymphocytic leukemia of B-cell type not having achieved remission: Secondary | ICD-10-CM

## 2019-03-11 NOTE — Telephone Encounter (Signed)
Asking if OK to take Avodart while he is on the acalabrutinib?  OK per [name].

## 2019-03-13 NOTE — Progress Notes (Signed)
Carelink Summary Report / Loop Recorder 

## 2019-03-18 ENCOUNTER — Inpatient Hospital Stay (HOSPITAL_BASED_OUTPATIENT_CLINIC_OR_DEPARTMENT_OTHER): Payer: Medicare Other | Admitting: Oncology

## 2019-03-18 ENCOUNTER — Telehealth: Payer: Self-pay | Admitting: Oncology

## 2019-03-18 ENCOUNTER — Other Ambulatory Visit: Payer: Self-pay

## 2019-03-18 ENCOUNTER — Inpatient Hospital Stay: Payer: Medicare Other

## 2019-03-18 VITALS — BP 133/61 | HR 54 | Temp 98.3°F | Resp 18 | Ht 63.0 in | Wt 161.2 lb

## 2019-03-18 DIAGNOSIS — C9110 Chronic lymphocytic leukemia of B-cell type not having achieved remission: Secondary | ICD-10-CM

## 2019-03-18 LAB — CBC WITH DIFFERENTIAL (CANCER CENTER ONLY)
Abs Immature Granulocytes: 0.09 10*3/uL — ABNORMAL HIGH (ref 0.00–0.07)
Basophils Absolute: 0.1 10*3/uL (ref 0.0–0.1)
Basophils Relative: 0 %
Eosinophils Absolute: 0.2 10*3/uL (ref 0.0–0.5)
Eosinophils Relative: 1 %
HCT: 43 % (ref 39.0–52.0)
Hemoglobin: 13.7 g/dL (ref 13.0–17.0)
Immature Granulocytes: 0 %
Lymphocytes Relative: 83 %
Lymphs Abs: 31.4 10*3/uL — ABNORMAL HIGH (ref 0.7–4.0)
MCH: 29.8 pg (ref 26.0–34.0)
MCHC: 31.9 g/dL (ref 30.0–36.0)
MCV: 93.5 fL (ref 80.0–100.0)
Monocytes Absolute: 0.9 10*3/uL (ref 0.1–1.0)
Monocytes Relative: 2 %
Neutro Abs: 5.2 10*3/uL (ref 1.7–7.7)
Neutrophils Relative %: 14 %
Platelet Count: 80 10*3/uL — ABNORMAL LOW (ref 150–400)
RBC: 4.6 MIL/uL (ref 4.22–5.81)
RDW: 12.8 % (ref 11.5–15.5)
WBC Count: 37.8 10*3/uL — ABNORMAL HIGH (ref 4.0–10.5)
nRBC: 0 % (ref 0.0–0.2)

## 2019-03-18 LAB — CMP (CANCER CENTER ONLY)
ALT: 12 U/L (ref 0–44)
AST: 13 U/L — ABNORMAL LOW (ref 15–41)
Albumin: 4.2 g/dL (ref 3.5–5.0)
Alkaline Phosphatase: 73 U/L (ref 38–126)
Anion gap: 6 (ref 5–15)
BUN: 28 mg/dL — ABNORMAL HIGH (ref 8–23)
CO2: 28 mmol/L (ref 22–32)
Calcium: 8.9 mg/dL (ref 8.9–10.3)
Chloride: 104 mmol/L (ref 98–111)
Creatinine: 1.47 mg/dL — ABNORMAL HIGH (ref 0.61–1.24)
GFR, Est AFR Am: 53 mL/min — ABNORMAL LOW (ref >60)
GFR, Estimated: 45 mL/min — ABNORMAL LOW (ref >60)
Glucose, Bld: 93 mg/dL (ref 70–99)
Potassium: 4.4 mmol/L (ref 3.5–5.1)
Sodium: 138 mmol/L (ref 135–145)
Total Bilirubin: 0.5 mg/dL (ref 0.3–1.2)
Total Protein: 6 g/dL — ABNORMAL LOW (ref 6.5–8.1)

## 2019-03-18 MED FILL — CALQUENCE 100 MG CAPSULE: 100 | 30 days supply | Qty: 30 | Fill #0

## 2019-03-18 NOTE — Progress Notes (Signed)
  [facility] OFFICE PROGRESS NOTE   Diagnosis: CLL  INTERVAL HISTORY:   Mr. Saling returns as scheduled.  He feels well.  Good appetite.  No fever or night sweats.  He is walking daily.  He performs yoga once a week.  No bleeding.  No falls.  Objective:  Vital signs in last 24 hours:  Blood pressure 133/61, pulse (!) 54, temperature 98.3 F (36.8 C), temperature source Oral, resp. rate 18, height 5' 3" (1.6 m), weight 161 lb 3.2 oz (73.1 kg), SpO2 99 %.   Limited physical examination secondary to distancing with the COVID pandemic Lymphatics: No cervical, supraclavicular, axillary, or left inguinal nodes.  1 cm lateral right inguinal node. Cardio: Regular rate and rhythm GI: No hepatosplenomegaly Vascular: No leg edema   Lab Results:  Lab Results  Component Value Date   WBC 37.8 (H) 03/18/2019   HGB 13.7 03/18/2019   HCT 43.0 03/18/2019   MCV 93.5 03/18/2019   PLT 80 (L) 03/18/2019   NEUTROABS 5.2 03/18/2019    CMP  Lab Results  Component Value Date   NA 138 03/18/2019   K 4.4 03/18/2019   CL 104 03/18/2019   CO2 28 03/18/2019   GLUCOSE 93 03/18/2019   BUN 28 (H) 03/18/2019   CREATININE 1.47 (H) 03/18/2019   CALCIUM 8.9 03/18/2019   PROT 6.0 (L) 03/18/2019   ALBUMIN 4.2 03/18/2019   AST 13 (L) 03/18/2019   ALT 12 03/18/2019   ALKPHOS 73 03/18/2019   BILITOT 0.5 03/18/2019   GFRNONAA 45 (L) 03/18/2019   GFRAA 53 (L) 03/18/2019     Medications: I have reviewed the patient's current medications.   Assessment/Plan: 1. CLL ? Review of the peripheral blood smear is consistent with chronic lymphocytic leukemia ? 12/16/2014 Peripheral blood flow cytometry consistent with CLL ? 10/16/2018 MRI brain and cervical spine - Prominent bulky cervical adenopathy within the visualized neck,compatible with history of CLL. ? FISH analysis- gain of 13q; 17p and 11q not detected ? Acalabrutinib 11/22/2018 2.thrombocytopenia-likely secondary to chronic  lymphocytic leukemia versus "pseudo thrombocytopenia"due to platelet clumping; progressive thrombocytopenia June 2018  Trial of prednisone started 10/17/2018 after admission with a fall/subarachnoid hemorrhage-starting dose 60 mg daily  Prednisone tapered to 40 mg daily beginning 10/22/2018  Prednisone taper to 30 mg daily beginning 10/29/2018, tapered to off over 6 days beginning 11/13/2018  Bone marrow biopsy 11/09/2018-hypercellular marrow with prominent involvement by CLL, abundant megakaryocytes, lymphocytes represent 82% of all cells, the cytogenetics returned normal 3.Hospitalization with sepsis/UTI with bacteremia (Enterobacter aerogenes)March 2018 4. Hospitalization for syncopal episode resulting in fall. Developed a small right subarachnoid hemorrhage.  5. MRI brain 10/16/2018 - Focal 1-2 cm osseous lesions involving the right parietal and occipital calvarium as above, indeterminate. Findings are of uncertain significance, with no definite corresponding osseous lesion seen on prior CT. 6.  History of idiopathic "anaphylactoid "reactions 7.  Paroxysmal atrial fibrillation atrial fibrillation noted on a cardiac monitor 2020, not placed on anticoagulation secondary to thrombocytopenia    Disposition: Mr. Opheim appears stable.  There has been slow improvement in the lymphocytosis and thrombocytopenia.  He is tolerating the acalabrutinib well.  He will continue acalabrutinib at the current dose.  He has received an influenza vaccine.  Mr. Rogus will return for a lab visit in 6 weeks and an office visit in 3 months.  He will see cardiology this week.  Betsy Coder, MD  03/18/2019  8:30 AM

## 2019-03-18 NOTE — Telephone Encounter (Signed)
Gave patient avs report and appointments for November and December  °

## 2019-03-20 ENCOUNTER — Ambulatory Visit (INDEPENDENT_AMBULATORY_CARE_PROVIDER_SITE_OTHER): Payer: Medicare Other | Admitting: Cardiology

## 2019-03-20 ENCOUNTER — Encounter: Payer: Self-pay | Admitting: Cardiology

## 2019-03-20 ENCOUNTER — Other Ambulatory Visit: Payer: Self-pay

## 2019-03-20 ENCOUNTER — Other Ambulatory Visit: Payer: Medicare Other

## 2019-03-20 ENCOUNTER — Ambulatory Visit: Payer: Medicare Other | Admitting: Oncology

## 2019-03-20 VITALS — BP 110/60 | HR 72 | Ht 63.0 in | Wt 160.6 lb

## 2019-03-20 DIAGNOSIS — I480 Paroxysmal atrial fibrillation: Secondary | ICD-10-CM | POA: Diagnosis not present

## 2019-03-20 DIAGNOSIS — I2510 Atherosclerotic heart disease of native coronary artery without angina pectoris: Secondary | ICD-10-CM | POA: Diagnosis not present

## 2019-03-20 DIAGNOSIS — I609 Nontraumatic subarachnoid hemorrhage, unspecified: Secondary | ICD-10-CM

## 2019-03-20 DIAGNOSIS — C9110 Chronic lymphocytic leukemia of B-cell type not having achieved remission: Secondary | ICD-10-CM

## 2019-03-20 DIAGNOSIS — R55 Syncope and collapse: Secondary | ICD-10-CM

## 2019-03-20 NOTE — Patient Instructions (Signed)
Medication Instructions:  No changes If you need a refill on your cardiac medications before your next appointment, please call your pharmacy.   Lab work: none If you have labs (blood work) drawn today and your tests are completely normal, you will receive your results only by: Marland Kitchen MyChart Message (if you have MyChart) OR . A paper copy in the mail If you have any lab test that is abnormal or we need to change your treatment, we will call you to review the results.  Testing/Procedures: none  Follow-Up: At [facility], you and your health needs are our priority.  As part of our continuing mission to provide you with exceptional heart care, we have created designated Provider Care Teams.  These Care Teams include your primary Cardiologist (physician) and Advanced Practice Providers (APPs -  Physician Assistants and Nurse Practitioners) who all work together to provide you with the care you need, when you need it.  (OK FOR VIRTUAL)  You will need a follow up appointment in 6 months. (CAN BE VIRTUAL) Please call our office 2 months in advance to schedule this appointment.  You may see Candee Furbish, MD. Any Other Special Instructions Will Be Listed Below (If Applicable).

## 2019-03-20 NOTE — Progress Notes (Signed)
Cardiology Office Note:    Date:  03/20/2019   ID:  [name], DOB [birth year], MRN [id number]  PCP:  [name], MD  Cardiologist:  Candee Furbish, MD  Electrophysiologist:  [name], MD   Referring MD: [name], MD   No chief complaint on file. Here with shortness of breath  History of Present Illness:    [name] is a 78 y.o. male with a hx of paroxysmal atrial fibrillation hypertension chronic thrombocytopenia followed by hematology with implantable loop recorder after syncope.  This was in April 2020.  Atrial fibrillation felt as though a churning feeling lasting a few minutes.  He was previously surprised to know that his episode lasted for 2 hours.  Has been seen in the atrial fibrillation clinic.  CT of coronary showed - ADDENDUM: CT FFR was done.  1.  Distal RCA occluded.  2.  LAD disease does not appear significant (FFR 0.84 mid LAD).  3.  No significant LCx disease.  [name]  Been feeling constant dyspnea. Not gasping. Could be anxiety. Lunch with 4 friends in back yard. Felt well, comfortable. In other situations feels differently. Exercise intolerance. 5 min of walking not too steep, laboring with this. No orthopnea. Has gotten worse. [name] is wife.   03/20/2019 - 2 ten minutes walk outside. Extending. SOB with moderate exertion. Prior syncope 5 months ago . Linq.  No further syncope.  No atrial fibrillation currently.  Feels something in upper arms 1/10. Better. Saw Dr. Inda Merlin. ECG ok.  Increasing his exercise tolerance.  Denies any fevers chills nausea vomiting syncope bleeding  Past Medical History:  Diagnosis Date  . Acute encephalopathy 08/17/2016  . Acute renal failure superimposed on stage 3 chronic kidney disease ([place]) 10/15/2018  . AKI (acute kidney injury) ([place]) 08/16/2016  . Arthritis   . Bacteremia due to Enterobacter species 08/19/2016  . Carpal tunnel syndrome, bilateral 10/09/2017  . Chest tightness 10/15/2018  . CLL  (chronic lymphocytic leukemia) ([place]) 12/16/2014  . Dyspnea 09/20/2012  . Elevated troponin 10/16/2018  . Enterobacter sepsis ([place]) 08/19/2016  . Fall 10/15/2018  . GERD (gastroesophageal reflux disease)   . Hepatitis 1966   Drug reaction after taking medication  . Hyperglycemia   . Hyperlipemia   . Hypertension   . Hypokalemia 08/16/2016  . Lung nodule seen on imaging study    bilateral lungs  . Nasal bone fracture 10/15/2018  . Right foot pain   . SAH (subarachnoid hemorrhage) ([place]) 10/15/2018  . Syncope 10/15/2018  . Thrombocytopenia ([place]) 10/15/2018  . Urinary tract infection with hematuria     Past Surgical History:  Procedure Laterality Date  . APPENDECTOMY    . arthroscoyp  2000   left knee  . COLONOSCOPY W/ POLYPECTOMY    . LOOP RECORDER INSERTION N/A 10/18/2018   Procedure: LOOP RECORDER INSERTION;  Surgeon: [name], MD;  Location: [facility];  Service: Cardiovascular;  Laterality: N/A;  . LUMBAR LAMINECTOMY/DECOMPRESSION MICRODISCECTOMY  06/27/2012   Procedure: LUMBAR LAMINECTOMY/DECOMPRESSION MICRODISCECTOMY 1 LEVEL;  Surgeon: [name], MD;  Location: [facility];  Service: Neurosurgery;  Laterality: Bilateral;  bilateral four-five laminectony  . SKIN CANCER EXCISION  5-6 years ago   moe-s surgery- basal b  . TONSILLECTOMY      Current Medications: Current Meds  Medication Sig  . CALQUENCE 100 MG CAPS TAKE 1 TABLET (100 MG) BY MOUTH DAILY.  Marland Kitchen Cholecalciferol (VITAMIN D) 2000 units CAPS Take 2,000 Units by  mouth daily.   Marland Kitchen EPINEPHrine (EPIPEN) 0.3 mg/0.3 mL DEVI Inject 0.3 mLs (0.3 mg total) into the muscle once.  . Loratadine 10 MG CAPS Take 10 mg by mouth.  . metoprolol succinate (TOPROL-XL) 25 MG 24 hr tablet Take 25 mg by mouth daily.  . simvastatin (ZOCOR) 40 MG tablet Take 20 mg by mouth daily.   . Tamsulosin HCl (FLOMAX) 0.4 MG CAPS Take 1 capsule (0.4 mg total) by mouth daily.     Allergies:   Patient has no known allergies.   Social  History   Socioeconomic History  . Marital status: Married    Spouse name: [name]   . Number of children: 2  . Years of education: PhD  . Highest education level: Not on file  Occupational History  . Occupation: Retired  Scientific laboratory technician  . Financial resource strain: Not on file  . Food insecurity    Worry: Not on file    Inability: Not on file  . Transportation needs    Medical: Not on file    Non-medical: Not on file  Tobacco Use  . Smoking status: Never Smoker  . Smokeless tobacco: Never Used  Substance and Sexual Activity  . Alcohol use: Yes    Alcohol/week: 2.0 standard drinks    Types: 2 Cans of beer per week    Comment: rare 2 beers per month  . Drug use: No  . Sexual activity: Not on file  Lifestyle  . Physical activity    Days per week: Not on file    Minutes per session: Not on file  . Stress: Not on file  Relationships  . Social Herbalist on phone: Not on file    Gets together: Not on file    Attends religious service: Not on file    Active member of club or organization: Not on file    Attends meetings of clubs or organizations: Not on file    Relationship status: Not on file  Other Topics Concern  . Not on file  Social History Narrative   Lives with wife   Caffeine use: 1 cup coffee per day   Diet coke      Right handed      Family History: The patient's family history includes Dementia in his father; Seizures in his sister; Sudden death (age of onset: 27) in his mother.  ROS:   Please see the history of present illness.     All other systems reviewed and are negative.  EKGs/Labs/Other Studies Reviewed:    The following studies were reviewed today:  ECHO 09/2018   1. The left ventricle has hyperdynamic systolic function, with an ejection fraction of >65%. The cavity size was normal. Left ventricular diastolic Doppler parameters are consistent with pseudonormal.  2. There is a dynamic mid LV gradient of 25 mmHg.  3. The right  ventricle has normal systolc function. There is no increase in right ventricular wall thickness.  4. No evidence of mitral valve stenosis.  5. No stenosis of the aortic valve.  ILR- NSR  CTA chest on 10/08/2018 was without PE.  CT of coronary ADDENDUM: CT FFR was done.  1.  Distal RCA occluded.  2.  LAD disease does not appear significant (FFR 0.84 mid LAD).  3.  No significant LCx disease.  [name]  EKG:  EKG is not ordered today.    Recent Labs: 03/18/2019: ALT 12; BUN 28; Creatinine 1.47; Hemoglobin 13.7; Platelet Count 80; Potassium  4.4; Sodium 138  Recent Lipid Panel    Component Value Date/Time   CHOL 106 10/16/2018 0339   TRIG 125 10/16/2018 0339   HDL 20 (L) 10/16/2018 0339   CHOLHDL 5.3 10/16/2018 0339   VLDL 25 10/16/2018 0339   LDLCALC 61 10/16/2018 0339    Physical Exam:    VS:  BP 110/60   Pulse 72   Ht 5' 3"  (1.6 m)   Wt 160 lb 9.6 oz (72.8 kg)   SpO2 98%   BMI 28.45 kg/m     Wt Readings from Last 3 Encounters:  03/20/19 160 lb 9.6 oz (72.8 kg)  03/18/19 161 lb 3.2 oz (73.1 kg)  02/15/19 158 lb (71.7 kg)     GEN: Well nourished, well developed, in no acute distress  HEENT: normal  Neck: no JVD, carotid bruits, or masses Cardiac: RRR; no murmurs, rubs, or gallops,no edema  Respiratory:  clear to auscultation bilaterally, normal work of breathing GI: soft, nontender, nondistended, + BS MS: no deformity or atrophy  Skin: warm and dry, no rash Neuro:  Alert and Oriented x 3, Strength and sensation are intact Psych: euthymic mood, full affect   ASSESSMENT:    1. Paroxysmal atrial fibrillation (HCC)   2. Syncope and collapse   3. Coronary artery disease involving native coronary artery of native heart without angina pectoris   4. SAH (subarachnoid hemorrhage) ([place])   5. CLL (chronic lymphocytic leukemia) (HCC)    PLAN:    In order of problems listed above:  Paroxysmal atrial fibrillation  - Eliquis discussed with Dr.  Benay Spice.  Given his platelet function, risk of bleeding possibly outweighed benefit.  His stroke risk is 4.8 %/year given his chads vasc score of 4.  He understands.  His wife was also present on the phone for prior discussion.  Appreciated visit in atrial fibrillation clinic and Dr. Curt Bears.    -Last saw Dr. Curt Bears on 02/15/2019-An episode of syncope associated with subarachnoid hemorrhage and a Linq monitor placement.  Echo showed normal EF.  Mild dynamic LV gradient 25 mmHg, may be playing some role in his shortness of breath..  Monitor unremarkable.  Not anticoagulated with 1 prior episode of PAF.  Trying to avoid with his CLL anticoagulation.  I think a good tactic would be to only start anticoagulation if Linq monitor demonstrates further evidence of atrial fibrillation.  CAD  -CT scan of coronary arteries reviewed.  Also CT scan of chest reviewed-no PE.  -It appears that the distal segment of the RCA is likely chronically occluded now.  On prior cardiac catheterization it was quite narrow distally small in caliber.  I discussed with him the myriad of options including having him sit down with our interventional team to see if opening up this RCA would make sense however this would require Plavix, antiplatelet and given his platelet function issues/CLL risks would likely outweigh benefits. - I think it makes sense for [place] right now to continue with medical management.  Still has some persistent shortness of breath with moderate exertion but this does seem to be improving with daily exercise and walking.  On Toprol.  Taking this daily.  We had tried in the past twice a day 25.  Hopefully this will help with relaxation of his heart as well given his hyperdynamic echocardiogram.  This is a good first step into helping him with possible anginal equivalent.  Interestingly, he did have similar symptoms back in 2014 during heart catheterization.  They may  have progressed however. -Next step would be to try to  add isosorbide 30 mg a day.  At this time, we do not need to pursue this. -LAD FFR was 0.84. - Given his underlying coronary artery disease there is always risk of possible MI.  Both he and his wife are aware.  I would like for him to continue to work on exercise efforts.  Instructions have been given to slow down if anginal symptoms are observed. -I have also given him a pulmonary referral.  His wife, on the phone, was happy about this as well.  I do not see a note in epic.  CLL/thrombocytopenia - Platelet count up and down.  Appreciate Dr. Ashok Cordia assistance.  Reviewed prior clinic note from 03/18/2019.  Performing yoga.   Medication Adjustments/Labs and Tests Ordered: Current medicines are reviewed at length with the patient today.  Concerns regarding medicines are outlined above.  No orders of the defined types were placed in this encounter.  No orders of the defined types were placed in this encounter.   Patient Instructions  Medication Instructions:  No changes If you need a refill on your cardiac medications before your next appointment, please call your pharmacy.   Lab work: none If you have labs (blood work) drawn today and your tests are completely normal, you will receive your results only by: Marland Kitchen MyChart Message (if you have MyChart) OR . A paper copy in the mail If you have any lab test that is abnormal or we need to change your treatment, we will call you to review the results.  Testing/Procedures: none  Follow-Up: At [facility], you and your health needs are our priority.  As part of our continuing mission to provide you with exceptional heart care, we have created designated Provider Care Teams.  These Care Teams include your primary Cardiologist (physician) and Advanced Practice Providers (APPs -  Physician Assistants and Nurse Practitioners) who all work together to provide you with the care you need, when you need it.  (OK FOR VIRTUAL)  You will need a follow  up appointment in 6 months. (CAN BE VIRTUAL) Please call our office 2 months in advance to schedule this appointment.  You may see Candee Furbish, MD. Any Other Special Instructions Will Be Listed Below (If Applicable).       Signed, Candee Furbish, MD  03/20/2019 11:42 AM    [facility]

## 2019-04-01 ENCOUNTER — Other Ambulatory Visit: Payer: Self-pay

## 2019-04-01 ENCOUNTER — Ambulatory Visit (INDEPENDENT_AMBULATORY_CARE_PROVIDER_SITE_OTHER): Payer: Medicare Other | Admitting: *Deleted

## 2019-04-01 DIAGNOSIS — R55 Syncope and collapse: Secondary | ICD-10-CM | POA: Diagnosis not present

## 2019-04-02 LAB — CUP PACEART REMOTE DEVICE CHECK
Date Time Interrogation Session: 20201012221636
Implantable Pulse Generator Implant Date: 20200430

## 2019-04-04 ENCOUNTER — Telehealth: Payer: Self-pay | Admitting: *Deleted

## 2019-04-04 NOTE — Telephone Encounter (Addendum)
Reporting he had Mohs surgery on 10/12 to remove a squamous cell cancer. Now asking if he can have a couple teeth pulled (his wife is pressuring him to have it done)? If yes, what precautions should he take in regards to the acalabrutinib? Per [name]: Try to delay unless it is necessary to be done now. Will need CBC day prior to procedure to assess platelet count. Acalabrutinib increases his bleeding risk. Patient notified. He will hold off for now.

## 2019-04-10 NOTE — Progress Notes (Signed)
Carelink Summary Report / Loop Recorder 

## 2019-04-11 ENCOUNTER — Other Ambulatory Visit: Payer: Self-pay | Admitting: Oncology

## 2019-04-11 DIAGNOSIS — C9110 Chronic lymphocytic leukemia of B-cell type not having achieved remission: Secondary | ICD-10-CM

## 2019-04-17 MED FILL — CALQUENCE 100 MG CAPSULE: 100 | 30 days supply | Qty: 30 | Fill #0

## 2019-04-29 ENCOUNTER — Telehealth: Payer: Self-pay | Admitting: *Deleted

## 2019-04-29 ENCOUNTER — Other Ambulatory Visit: Payer: Self-pay

## 2019-04-29 ENCOUNTER — Inpatient Hospital Stay: Payer: Medicare Other | Attending: Oncology

## 2019-04-29 DIAGNOSIS — C9110 Chronic lymphocytic leukemia of B-cell type not having achieved remission: Secondary | ICD-10-CM | POA: Insufficient documentation

## 2019-04-29 LAB — CBC WITH DIFFERENTIAL (CANCER CENTER ONLY)
Abs Immature Granulocytes: 0.11 10*3/uL — ABNORMAL HIGH (ref 0.00–0.07)
Basophils Absolute: 0.1 10*3/uL (ref 0.0–0.1)
Basophils Relative: 0 %
Eosinophils Absolute: 0.2 10*3/uL (ref 0.0–0.5)
Eosinophils Relative: 1 %
HCT: 44.2 % (ref 39.0–52.0)
Hemoglobin: 14.1 g/dL (ref 13.0–17.0)
Immature Granulocytes: 0 %
Lymphocytes Relative: 75 %
Lymphs Abs: 24.6 10*3/uL — ABNORMAL HIGH (ref 0.7–4.0)
MCH: 29.1 pg (ref 26.0–34.0)
MCHC: 31.9 g/dL (ref 30.0–36.0)
MCV: 91.3 fL (ref 80.0–100.0)
Monocytes Absolute: 0.9 10*3/uL (ref 0.1–1.0)
Monocytes Relative: 3 %
Neutro Abs: 6.7 10*3/uL (ref 1.7–7.7)
Neutrophils Relative %: 21 %
Platelet Count: 80 10*3/uL — ABNORMAL LOW (ref 150–400)
RBC: 4.84 MIL/uL (ref 4.22–5.81)
RDW: 12.8 % (ref 11.5–15.5)
WBC Count: 32.6 10*3/uL — ABNORMAL HIGH (ref 4.0–10.5)
nRBC: 0 % (ref 0.0–0.2)

## 2019-04-29 NOTE — Telephone Encounter (Signed)
Patient reviewing his CBC today. Noted that WBC is better. Platelets have plateaued around 80,000. Asking if this is expected or does he anticipate the going higher at some point? Is this OK to stay around 80? Adds that he is having no side effects from the acalabrutinib.

## 2019-04-29 NOTE — Telephone Encounter (Signed)
Notified patient that [name] reviewed his labs today. States this may be where his platelet count stays. Could be toxicity from the acalabrutinib. F/U as scheduled.

## 2019-04-30 ENCOUNTER — Telehealth: Payer: Self-pay | Admitting: *Deleted

## 2019-04-30 NOTE — Telephone Encounter (Signed)
Called to request [name] call him to "discuss my situation in more detail". Wants a clearer understanding of his counts and expected recovery of his counts and his treatment. Please call tomorrow after 10:30 am.

## 2019-05-04 LAB — CUP PACEART REMOTE DEVICE CHECK
Date Time Interrogation Session: 20201114194512
Implantable Pulse Generator Implant Date: 20200430

## 2019-05-06 ENCOUNTER — Ambulatory Visit (INDEPENDENT_AMBULATORY_CARE_PROVIDER_SITE_OTHER): Payer: Medicare Other | Admitting: *Deleted

## 2019-05-06 DIAGNOSIS — R55 Syncope and collapse: Secondary | ICD-10-CM | POA: Diagnosis not present

## 2019-05-09 ENCOUNTER — Other Ambulatory Visit: Payer: Self-pay | Admitting: Oncology

## 2019-05-09 DIAGNOSIS — C9110 Chronic lymphocytic leukemia of B-cell type not having achieved remission: Secondary | ICD-10-CM

## 2019-05-14 MED FILL — CALQUENCE 100 MG CAPSULE: 100 | 30 days supply | Qty: 30 | Fill #0

## 2019-05-30 NOTE — Progress Notes (Signed)
Carelink Summary Report / Loop Recorder 

## 2019-06-06 ENCOUNTER — Ambulatory Visit (INDEPENDENT_AMBULATORY_CARE_PROVIDER_SITE_OTHER): Payer: Medicare Other | Admitting: *Deleted

## 2019-06-06 DIAGNOSIS — R55 Syncope and collapse: Secondary | ICD-10-CM

## 2019-06-07 LAB — CUP PACEART REMOTE DEVICE CHECK
Date Time Interrogation Session: 20201217171908
Implantable Pulse Generator Implant Date: 20200430

## 2019-06-10 ENCOUNTER — Inpatient Hospital Stay: Payer: Medicare Other | Attending: Oncology | Admitting: Oncology

## 2019-06-10 ENCOUNTER — Other Ambulatory Visit: Payer: Self-pay | Admitting: Oncology

## 2019-06-10 ENCOUNTER — Inpatient Hospital Stay: Payer: Medicare Other

## 2019-06-10 ENCOUNTER — Other Ambulatory Visit: Payer: Self-pay

## 2019-06-10 VITALS — BP 148/66 | HR 56 | Temp 98.0°F | Resp 20 | Wt 161.9 lb

## 2019-06-10 DIAGNOSIS — C9110 Chronic lymphocytic leukemia of B-cell type not having achieved remission: Secondary | ICD-10-CM | POA: Diagnosis not present

## 2019-06-10 DIAGNOSIS — Z79899 Other long term (current) drug therapy: Secondary | ICD-10-CM | POA: Insufficient documentation

## 2019-06-10 DIAGNOSIS — D696 Thrombocytopenia, unspecified: Secondary | ICD-10-CM | POA: Insufficient documentation

## 2019-06-10 LAB — CBC WITH DIFFERENTIAL (CANCER CENTER ONLY)
Abs Immature Granulocytes: 0.04 10*3/uL (ref 0.00–0.07)
Basophils Absolute: 0.1 10*3/uL (ref 0.0–0.1)
Basophils Relative: 1 %
Eosinophils Absolute: 0.2 10*3/uL (ref 0.0–0.5)
Eosinophils Relative: 1 %
HCT: 47.3 % (ref 39.0–52.0)
Hemoglobin: 15.1 g/dL (ref 13.0–17.0)
Immature Granulocytes: 0 %
Lymphocytes Relative: 68 %
Lymphs Abs: 14.6 10*3/uL — ABNORMAL HIGH (ref 0.7–4.0)
MCH: 30.1 pg (ref 26.0–34.0)
MCHC: 31.9 g/dL (ref 30.0–36.0)
MCV: 94.4 fL (ref 80.0–100.0)
Monocytes Absolute: 0.7 10*3/uL (ref 0.1–1.0)
Monocytes Relative: 4 %
Neutro Abs: 5.4 10*3/uL (ref 1.7–7.7)
Neutrophils Relative %: 26 %
Platelet Count: 90 10*3/uL — ABNORMAL LOW (ref 150–400)
RBC: 5.01 MIL/uL (ref 4.22–5.81)
RDW: 13 % (ref 11.5–15.5)
WBC Count: 21.1 10*3/uL — ABNORMAL HIGH (ref 4.0–10.5)
nRBC: 0 % (ref 0.0–0.2)

## 2019-06-10 LAB — CMP (CANCER CENTER ONLY)
ALT: 14 U/L (ref 0–44)
AST: 15 U/L (ref 15–41)
Albumin: 4.4 g/dL (ref 3.5–5.0)
Alkaline Phosphatase: 75 U/L (ref 38–126)
Anion gap: 10 (ref 5–15)
BUN: 27 mg/dL — ABNORMAL HIGH (ref 8–23)
CO2: 27 mmol/L (ref 22–32)
Calcium: 8.9 mg/dL (ref 8.9–10.3)
Chloride: 105 mmol/L (ref 98–111)
Creatinine: 1.48 mg/dL — ABNORMAL HIGH (ref 0.61–1.24)
GFR, Est AFR Am: 52 mL/min — ABNORMAL LOW (ref >60)
GFR, Estimated: 45 mL/min — ABNORMAL LOW (ref >60)
Glucose, Bld: 102 mg/dL — ABNORMAL HIGH (ref 70–99)
Potassium: 4.1 mmol/L (ref 3.5–5.1)
Sodium: 142 mmol/L (ref 135–145)
Total Bilirubin: 0.8 mg/dL (ref 0.3–1.2)
Total Protein: 6.4 g/dL — ABNORMAL LOW (ref 6.5–8.1)

## 2019-06-10 NOTE — Progress Notes (Signed)
[name]   Diagnosis: CLL  INTERVAL HISTORY:   [name] returns as scheduled.  He feels well.  No rash, diarrhea, fever, night sweats, or arthralgias.  No bleeding.  He is walking daily.  Objective:  Vital signs in last 24 hours:  Blood pressure (!) 148/66, pulse (!) 56, temperature 98 F (36.7 C), temperature source Temporal, resp. rate 20, weight 161 lb 14.4 oz (73.4 kg), SpO2 100 %.    Limited physical examination secondary to distancing with the Covid pandemic Lymphatics: No cervical, supraclavicular, axillary, or inguinal nodes GI: No hepatosplenomegaly Vascular: No leg edema   Lab Results:  Lab Results  Component Value Date   WBC 21.1 (H) 06/10/2019   HGB 15.1 06/10/2019   HCT 47.3 06/10/2019   MCV 94.4 06/10/2019   PLT 90 (L) 06/10/2019   NEUTROABS 5.4 06/10/2019    CMP  Lab Results  Component Value Date   NA 142 06/10/2019   K 4.1 06/10/2019   CL 105 06/10/2019   CO2 27 06/10/2019   GLUCOSE 102 (H) 06/10/2019   BUN 27 (H) 06/10/2019   CREATININE 1.48 (H) 06/10/2019   CALCIUM 8.9 06/10/2019   PROT 6.4 (L) 06/10/2019   ALBUMIN 4.4 06/10/2019   AST 15 06/10/2019   ALT 14 06/10/2019   ALKPHOS 75 06/10/2019   BILITOT 0.8 06/10/2019   GFRNONAA 45 (L) 06/10/2019   GFRAA 52 (L) 06/10/2019      Imaging:  CUP PACEART REMOTE DEVICE CHECK  Result Date: 06/07/2019 Carelink summary report received. Battery status OK. Normal device function. No new symptom episodes, tachy episodes, brady, or pause episodes. No new AF episodes. Monthly summary reports and ROV/PRN   Medications: I have reviewed the patient's current medications.   Assessment/Plan: 1. CLL ? Review of the peripheral blood smear is consistent with chronic lymphocytic leukemia ? 12/16/2014 Peripheral blood flow cytometry consistent with CLL ? 10/16/2018 MRI brain and cervical spine - Prominent bulky cervical adenopathy within the visualized  neck,compatible with history of CLL. ? FISH analysis- gain of 13q; 17p and 11q not detected ? Acalabrutinib 11/22/2018 2.thrombocytopenia-likely secondary to chronic lymphocytic leukemia versus "pseudo thrombocytopenia"due to platelet clumping; progressive thrombocytopenia June 2018  Trial of prednisone started 10/17/2018 after admission with a fall/subarachnoid hemorrhage-starting dose 60 mg daily  Prednisone tapered to 40 mg daily beginning 10/22/2018  Prednisone taper to 30 mg daily beginning 10/29/2018, tapered to off over 6 days beginning 11/13/2018  Bone marrow biopsy 11/09/2018-hypercellular marrow with prominent involvement by CLL, abundant megakaryocytes, lymphocytes represent 82% of all cells, the cytogenetics returned normal 3.Hospitalization with sepsis/UTI with bacteremia (Enterobacter aerogenes)March 2018 4. Hospitalization for syncopal episode resulting in fall. Developed a small right subarachnoid hemorrhage.  5. MRI brain 10/16/2018 - Focal 1-2 cm osseous lesions involving the right parietal and occipital calvarium as above, indeterminate. Findings are of uncertain significance, with no definite corresponding osseous lesion seen on prior CT. 6.  History of idiopathic "anaphylactoid "reactions 7.  Paroxysmal atrial fibrillation atrial fibrillation noted on a cardiac monitor 2020, not placed on anticoagulation secondary to thrombocytopenia   Disposition: [name] appears stable.  He is tolerating the acalabrutinib well.  The lymphocyte count continues to fall and the platelets are improved.  He will continue acalabrutinib at the current dose.  [name] will return for a lab visit in 2 months and an office visit in 4 months.  He will call for new symptoms.  Betsy Coder, MD  06/10/2019  8:32  AM   

## 2019-06-12 MED FILL — CALQUENCE 100 MG CAPSULE: 100 | 30 days supply | Qty: 30 | Fill #0

## 2019-06-17 ENCOUNTER — Telehealth: Payer: Self-pay | Admitting: Oncology

## 2019-06-17 NOTE — Telephone Encounter (Signed)
Returned patient's phone call regarding scheduling an appointment, patient will receive a call back once I get further help from provides nurse.

## 2019-06-18 ENCOUNTER — Telehealth: Payer: Self-pay | Admitting: Oncology

## 2019-06-18 NOTE — Telephone Encounter (Signed)
Scheduled appt per 12/29 sch message - pt is aware of appt date and time  

## 2019-07-09 ENCOUNTER — Ambulatory Visit (INDEPENDENT_AMBULATORY_CARE_PROVIDER_SITE_OTHER): Payer: Medicare PPO | Admitting: *Deleted

## 2019-07-09 DIAGNOSIS — R55 Syncope and collapse: Secondary | ICD-10-CM | POA: Diagnosis not present

## 2019-07-10 LAB — CUP PACEART REMOTE DEVICE CHECK
Date Time Interrogation Session: 20210119172623
Implantable Pulse Generator Implant Date: 20200430

## 2019-07-12 ENCOUNTER — Other Ambulatory Visit: Payer: Self-pay | Admitting: Oncology

## 2019-07-12 DIAGNOSIS — C9110 Chronic lymphocytic leukemia of B-cell type not having achieved remission: Secondary | ICD-10-CM

## 2019-07-17 MED FILL — CALQUENCE 100 MG CAPSULE: 100 | 30 days supply | Qty: 30 | Fill #0

## 2019-07-24 ENCOUNTER — Encounter: Payer: Self-pay | Admitting: *Deleted

## 2019-07-24 ENCOUNTER — Telehealth (INDEPENDENT_AMBULATORY_CARE_PROVIDER_SITE_OTHER): Payer: Medicare PPO | Admitting: Cardiology

## 2019-07-24 ENCOUNTER — Encounter: Payer: Self-pay | Admitting: Cardiology

## 2019-07-24 ENCOUNTER — Other Ambulatory Visit: Payer: Self-pay

## 2019-07-24 ENCOUNTER — Telehealth: Payer: Self-pay | Admitting: *Deleted

## 2019-07-24 VITALS — Ht 63.0 in | Wt 161.0 lb

## 2019-07-24 DIAGNOSIS — I2510 Atherosclerotic heart disease of native coronary artery without angina pectoris: Secondary | ICD-10-CM | POA: Diagnosis not present

## 2019-07-24 DIAGNOSIS — I480 Paroxysmal atrial fibrillation: Secondary | ICD-10-CM

## 2019-07-24 DIAGNOSIS — I209 Angina pectoris, unspecified: Secondary | ICD-10-CM | POA: Diagnosis not present

## 2019-07-24 DIAGNOSIS — C9110 Chronic lymphocytic leukemia of B-cell type not having achieved remission: Secondary | ICD-10-CM

## 2019-07-24 NOTE — Telephone Encounter (Signed)
Virtual Visit Pre-Appointment Phone Call  "(Name), I am calling you today to discuss your upcoming appointment. We are currently trying to limit exposure to the virus that causes COVID-19 by seeing patients at home rather than in the office."  1. "What is the BEST phone number to call the day of the visit?" - include this in appointment notes-YES  2. "Do you have or have access to (through a family member/friend) a smartphone with video capability that we can use for your visit?" a. If no - list the appointment type as a PHONE visit in appointment notes-YES  3. Confirm consent - "In the setting of the current Covid19 crisis, you are scheduled for a (phone or video) visit with your provider on (date) at (time).  Just as we do with many in-office visits, in order for you to participate in this visit, we must obtain consent.  If you'd like, I can send this to your mychart (if signed up) or email for you to review.  Otherwise, I can obtain your verbal consent now.  All virtual visits are billed to your insurance company just like a normal visit would be.  By agreeing to a virtual visit, we'd like you to understand that the technology does not allow for your provider to perform an examination, and thus may limit your provider's ability to fully assess your condition. If your provider identifies any concerns that need to be evaluated in person, we will make arrangements to do so.  Finally, though the technology is pretty good, we cannot assure that it will always work on either your or our end, and in the setting of a video visit, we may have to convert it to a phone-only visit.  In either situation, we cannot ensure that we have a secure connection.  Are you willing to proceed?" STAFF: Did the patient verbally acknowledge consent to telehealth visit? Document YES/NO here: PT GAVE VERBAL CONSENT ON PHONE FOR DR. [name] TO TREAT HIM VIA TELEPHONE VISIT ON 07/24/19  4. Advise patient to be prepared - "Two  hours prior to your appointment, go ahead and check your blood pressure, pulse, oxygen saturation, and your weight (if you have the equipment to check those) and write them all down. When your visit starts, your provider will ask you for this information. If you have an Apple Watch or Kardia device, please plan to have heart rate information ready on the day of your appointment. Please have a pen and paper handy nearby the day of the visit as well."--PT UNABLE TO PROVIDE V/S 5.  6. Inform patient they will receive a phone call 15 minutes prior to their appointment time (may be from unknown caller ID) so they should be prepared to answer-YES    TELEPHONE CALL NOTE  HEWITT ALMAND has been deemed a candidate for a follow-up tele-health visit to limit community exposure during the Covid-19 pandemic. I spoke with the patient via phone to ensure availability of phone/video source, confirm preferred email & phone number, and discuss instructions and expectations.  I reminded SHAHIN POLCZYNSKI to be prepared with any vital sign and/or heart rhythm information that could potentially be obtained via home monitoring, at the time of his visit. I reminded KYJUAN STOLLEY to expect a phone call prior to his visit.  Nuala Alpha, LPN [id number]       FULL LENGTH CONSENT FOR TELE-HEALTH VISIT   I hereby voluntarily request, consent and authorize [place] and  its employed or Journalist, newspaper, Engineer, materials, nurse practitioners or other licensed health care professionals (the Practitioner), to provide me with telemedicine health care services (the "Services") as deemed necessary by the treating Practitioner. I acknowledge and consent to receive the Services by the Practitioner via telemedicine. I understand that the telemedicine visit will involve communicating with the Practitioner through live audiovisual communication technology and the disclosure of certain medical information by  electronic transmission. I acknowledge that I have been given the opportunity to request an in-person assessment or other available alternative prior to the telemedicine visit and am voluntarily participating in the telemedicine visit.  I understand that I have the right to withhold or withdraw my consent to the use of telemedicine in the course of my care at any time, without affecting my right to future care or treatment, and that the Practitioner or I may terminate the telemedicine visit at any time. I understand that I have the right to inspect all information obtained and/or recorded in the course of the telemedicine visit and may receive copies of available information for a reasonable fee.  I understand that some of the potential risks of receiving the Services via telemedicine include:  Marland Kitchen Delay or interruption in medical evaluation due to technological equipment failure or disruption; . Information transmitted may not be sufficient (e.g. poor resolution of images) to allow for appropriate medical decision making by the Practitioner; and/or  . In rare instances, security protocols could fail, causing a breach of personal health information.  Furthermore, I acknowledge that it is my responsibility to provide information about my medical history, conditions and care that is complete and accurate to the best of my ability. I acknowledge that Practitioner's advice, recommendations, and/or decision may be based on factors not within their control, such as incomplete or inaccurate data provided by me or distortions of diagnostic images or specimens that may result from electronic transmissions. I understand that the practice of medicine is not an exact science and that Practitioner makes no warranties or guarantees regarding treatment outcomes. I acknowledge that I will receive a copy of this consent concurrently upon execution via email to the email address I last provided but may also request a printed  copy by calling the office of [facility].    I understand that my insurance will be billed for this visit.   I have read or had this consent read to me. . I understand the contents of this consent, which adequately explains the benefits and risks of the Services being provided via telemedicine.  . I have been provided ample opportunity to ask questions regarding this consent and the Services and have had my questions answered to my satisfaction. . I give my informed consent for the services to be provided through the use of telemedicine in my medical care  By participating in this telemedicine visit I agree to the above.  PT GAVE VERBAL CONSENT FOR DR. [name] TO TREAT HIM VIA TELEPHONE VISIT ON 07/24/19 AT 0840.

## 2019-07-24 NOTE — Patient Instructions (Signed)
Medication Instructions:   Your physician recommends that you continue on your current medications as directed. Please refer to the Current Medication list given to you today.  *If you need a refill on your cardiac medications before your next appointment, please call your pharmacy*    Follow-Up: At [facility], you and your health needs are our priority.  As part of our continuing mission to provide you with exceptional heart care, we have created designated Provider Care Teams.  These Care Teams include your primary Cardiologist (physician) and Advanced Practice Providers (APPs -  Physician Assistants and Nurse Practitioners) who all work together to provide you with the care you need, when you need it.  Your next appointment:   6 month(s)  The format for your next appointment:   In Person  Provider:   Candee Furbish, MD

## 2019-07-24 NOTE — Progress Notes (Signed)
Virtual Visit via Telephone Note   This visit type was conducted due to national recommendations for restrictions regarding the COVID-19 Pandemic (e.g. social distancing) in an effort to limit this patient's exposure and mitigate transmission in our community.  Due to his co-morbid illnesses, this patient is at least at moderate risk for complications without adequate follow up.  This format is felt to be most appropriate for this patient at this time.  The patient did not have access to video technology/had technical difficulties with video requiring transitioning to audio format only (telephone).  All issues noted in this document were discussed and addressed.  No physical exam could be performed with this format.  Please refer to the patient's chart for his  consent to telehealth for [facility].   Date:  07/24/2019   ID:  [name], DOB May 14, 1941, MRN [id number]  Patient Location: Home Provider Location: Home  PCP:  [name], MD  Cardiologist:  Candee Furbish, MD  Electrophysiologist:  [name], MD   Evaluation Performed:  Follow-Up Visit  Chief Complaint:  PAF  History of Present Illness:    [name] is a 79 y.o. male with PAF, CAD.  Had a right small subarachnoid hemorrhage after syncopal episode resulting in a fall.  Has not been on anticoagulation, thankfully no further episodes of PAF.  Walking well. Never misses. Yoga.   Concerns him that mildly winded with walking. 1.2/10 pressure when walking.   Data from loop fine, no AFIB.   Not aware of needing more O2  No more symptoms like syncope.   Deb wife  The patient does not have symptoms concerning for COVID-19 infection (fever, chills, cough, or new shortness of breath).    Past Medical History:  Diagnosis Date  . Acute encephalopathy 08/17/2016  . Acute renal failure superimposed on stage 3 chronic kidney disease ([place]) 10/15/2018  . AKI (acute kidney injury) ([place]) 08/16/2016  .  Arthritis   . Bacteremia due to Enterobacter species 08/19/2016  . Carpal tunnel syndrome, bilateral 10/09/2017  . Chest tightness 10/15/2018  . CLL (chronic lymphocytic leukemia) ([place]) 12/16/2014  . Dyspnea 09/20/2012  . Elevated troponin 10/16/2018  . Enterobacter sepsis ([place]) 08/19/2016  . Fall 10/15/2018  . GERD (gastroesophageal reflux disease)   . Hepatitis 1966   Drug reaction after taking medication  . Hyperglycemia   . Hyperlipemia   . Hypertension   . Hypokalemia 08/16/2016  . Lung nodule seen on imaging study    bilateral lungs  . Nasal bone fracture 10/15/2018  . Right foot pain   . SAH (subarachnoid hemorrhage) ([place]) 10/15/2018  . Syncope 10/15/2018  . Thrombocytopenia ([place]) 10/15/2018  . Urinary tract infection with hematuria    Past Surgical History:  Procedure Laterality Date  . APPENDECTOMY    . arthroscoyp  2000   left knee  . COLONOSCOPY W/ POLYPECTOMY    . LOOP RECORDER INSERTION N/A 10/18/2018   Procedure: LOOP RECORDER INSERTION;  Surgeon: [name], MD;  Location: [place] [facility];  Service: Cardiovascular;  Laterality: N/A;  . LUMBAR LAMINECTOMY/DECOMPRESSION MICRODISCECTOMY  06/27/2012   Procedure: LUMBAR LAMINECTOMY/DECOMPRESSION MICRODISCECTOMY 1 LEVEL;  Surgeon: [name], MD;  Location: [place] [facility];  Service: Neurosurgery;  Laterality: Bilateral;  bilateral four-five laminectony  . SKIN CANCER EXCISION  5-6 years ago   moe-s surgery- basal b  . TONSILLECTOMY       Current Meds  Medication Sig  . CALQUENCE 100 MG capsule TAKE  1 CAPSULE (100 MG) BY MOUTH DAILY.  Marland Kitchen Cholecalciferol (VITAMIN D) 2000 units CAPS Take 2,000 Units by mouth daily.   Marland Kitchen EPINEPHrine (EPIPEN) 0.3 mg/0.3 mL DEVI Inject 0.3 mLs (0.3 mg total) into the muscle once.  . Loratadine 10 MG CAPS Take 10 mg by mouth.  . metoprolol succinate (TOPROL-XL) 25 MG 24 hr tablet Take 25 mg by mouth daily.  . simvastatin (ZOCOR) 40 MG tablet Take 20 mg by mouth daily.      Allergies:    Patient has no known allergies.   Social History   Tobacco Use  . Smoking status: Never Smoker  . Smokeless tobacco: Never Used  Substance Use Topics  . Alcohol use: Yes    Alcohol/week: 2.0 standard drinks    Types: 2 Cans of beer per week    Comment: rare 2 beers per month  . Drug use: No     Family Hx: The patient's family history includes Dementia in his father; Seizures in his sister; Sudden death (age of onset: 48) in his mother.  ROS:   Please see the history of present illness.     All other systems reviewed and are negative.   Prior CV studies:   The following studies were reviewed today:  CT scan of coronary arteries-distal RCA occluded LAD nonsignificant, 0.84 FFR  Echo 2020-EF greater than 65% 25 mm dynamic LV gradient.  Implantable loop recorder-normal sinus rhythm  Labs/Other Tests and Data Reviewed:    EKG:  No new EKG  Recent Labs: 06/10/2019: ALT 14; BUN 27; Creatinine 1.48; Hemoglobin 15.1; Platelet Count 90; Potassium 4.1; Sodium 142   Recent Lipid Panel Lab Results  Component Value Date/Time   CHOL 106 10/16/2018 03:39 AM   TRIG 125 10/16/2018 03:39 AM   HDL 20 (L) 10/16/2018 03:39 AM   CHOLHDL 5.3 10/16/2018 03:39 AM   LDLCALC 61 10/16/2018 03:39 AM    Wt Readings from Last 3 Encounters:  07/24/19 161 lb (73 kg)  06/10/19 161 lb 14.4 oz (73.4 kg)  03/20/19 160 lb 9.6 oz (72.8 kg)     Objective:    Vital Signs:  Ht 5\' 3"  (1.6 m)   Wt 161 lb (73 kg)   BMI 28.52 kg/m    VITAL SIGNS:  reviewed weight 161 pounds General able to speak in full sentences without difficulty.  Pleasant.  ASSESSMENT & PLAN:    Paroxysmal atrial fibrillation -Implantable recorder shows no further evidence of A. fib.  Excellent.  He is not on anticoagulation because of bleeding risk, prior subarachnoid hemorrhage suffered after syncopal episode and fall.  CLL -Followed very closely by [name].  Blood work reviewed demonstrating platelet count in the  80-90 range from outside labs.  Coronary artery disease/angina -Very well controlled, Toprol 25 mg a day.  Very minimal episodes when walking, extremely like chest pressure.  Continue with exercise.  Hyperlipidemia -Simvastatin 40.  I am comfortable with him continuing this.  LDL 51 from outside labs in 2020.  ALT 14.  COVID-19 Education: The signs and symptoms of COVID-19 were discussed with the patient and how to seek care for testing (follow up with PCP or arrange E-visit).  The importance of social distancing was discussed today.  Time:   Today, I have spent 15 minutes with the patient with telehealth technology discussing the above problems.     Medication Adjustments/Labs and Tests Ordered: Current medicines are reviewed at length with the patient today.  Concerns regarding medicines are outlined  above.   Tests Ordered: No orders of the defined types were placed in this encounter.   Medication Changes: No orders of the defined types were placed in this encounter.   Follow Up:  In Person in 6 month(s)  Signed, Candee Furbish, MD  07/24/2019 9:10 AM    [place]

## 2019-08-12 ENCOUNTER — Ambulatory Visit (INDEPENDENT_AMBULATORY_CARE_PROVIDER_SITE_OTHER): Payer: Medicare PPO | Admitting: *Deleted

## 2019-08-12 ENCOUNTER — Telehealth: Payer: Self-pay | Admitting: *Deleted

## 2019-08-12 ENCOUNTER — Inpatient Hospital Stay: Payer: Medicare PPO | Attending: Oncology

## 2019-08-12 ENCOUNTER — Other Ambulatory Visit: Payer: Self-pay

## 2019-08-12 DIAGNOSIS — C9110 Chronic lymphocytic leukemia of B-cell type not having achieved remission: Secondary | ICD-10-CM | POA: Diagnosis not present

## 2019-08-12 DIAGNOSIS — R55 Syncope and collapse: Secondary | ICD-10-CM

## 2019-08-12 LAB — CUP PACEART REMOTE DEVICE CHECK
Date Time Interrogation Session: 20210222005553
Implantable Pulse Generator Implant Date: 20200430

## 2019-08-12 LAB — CMP (CANCER CENTER ONLY)
ALT: 12 U/L (ref 0–44)
AST: 13 U/L — ABNORMAL LOW (ref 15–41)
Albumin: 4.1 g/dL (ref 3.5–5.0)
Alkaline Phosphatase: 80 U/L (ref 38–126)
Anion gap: 10 (ref 5–15)
BUN: 28 mg/dL — ABNORMAL HIGH (ref 8–23)
CO2: 26 mmol/L (ref 22–32)
Calcium: 8.9 mg/dL (ref 8.9–10.3)
Chloride: 108 mmol/L (ref 98–111)
Creatinine: 1.4 mg/dL — ABNORMAL HIGH (ref 0.61–1.24)
GFR, Est AFR Am: 55 mL/min — ABNORMAL LOW (ref >60)
GFR, Estimated: 48 mL/min — ABNORMAL LOW (ref >60)
Glucose, Bld: 79 mg/dL (ref 70–99)
Potassium: 4.3 mmol/L (ref 3.5–5.1)
Sodium: 144 mmol/L (ref 135–145)
Total Bilirubin: 0.6 mg/dL (ref 0.3–1.2)
Total Protein: 6.2 g/dL — ABNORMAL LOW (ref 6.5–8.1)

## 2019-08-12 LAB — CBC WITH DIFFERENTIAL (CANCER CENTER ONLY)
Abs Immature Granulocytes: 0.05 10*3/uL (ref 0.00–0.07)
Basophils Absolute: 0.1 10*3/uL (ref 0.0–0.1)
Basophils Relative: 1 %
Eosinophils Absolute: 0.3 10*3/uL (ref 0.0–0.5)
Eosinophils Relative: 2 %
HCT: 47.5 % (ref 39.0–52.0)
Hemoglobin: 15.1 g/dL (ref 13.0–17.0)
Immature Granulocytes: 0 %
Lymphocytes Relative: 60 %
Lymphs Abs: 10.7 10*3/uL — ABNORMAL HIGH (ref 0.7–4.0)
MCH: 30.4 pg (ref 26.0–34.0)
MCHC: 31.8 g/dL (ref 30.0–36.0)
MCV: 95.8 fL (ref 80.0–100.0)
Monocytes Absolute: 0.8 10*3/uL (ref 0.1–1.0)
Monocytes Relative: 5 %
Neutro Abs: 5.7 10*3/uL (ref 1.7–7.7)
Neutrophils Relative %: 32 %
Platelet Count: 116 10*3/uL — ABNORMAL LOW (ref 150–400)
RBC: 4.96 MIL/uL (ref 4.22–5.81)
RDW: 12.7 % (ref 11.5–15.5)
WBC Count: 17.6 10*3/uL — ABNORMAL HIGH (ref 4.0–10.5)
nRBC: 0 % (ref 0.0–0.2)

## 2019-08-12 NOTE — Progress Notes (Signed)
ILR Remote 

## 2019-08-12 NOTE — Telephone Encounter (Signed)
-----   Message from Ladell Pier, MD sent at 08/12/2019  2:25 PM EST ----- Please call patient, cbc looks good, continue acalabrutinib same dose

## 2019-08-12 NOTE — Telephone Encounter (Signed)
Informed patient of lab results. Continue same dose of acalabrutinib. F/u as scheduled.

## 2019-08-15 MED FILL — CALQUENCE 100 MG CAPSULE: 100 | 30 days supply | Qty: 30 | Fill #1

## 2019-08-21 ENCOUNTER — Telehealth: Payer: Self-pay

## 2019-08-21 NOTE — Telephone Encounter (Signed)
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 I told him the new insurance company may not be covering the whole amount like before. I told him to call the insurance company and I will have the billing department give him a call back to discuss the bills.

## 2019-09-09 ENCOUNTER — Ambulatory Visit: Payer: Medicare PPO

## 2019-09-09 ENCOUNTER — Other Ambulatory Visit: Payer: Self-pay | Admitting: Oncology

## 2019-09-09 DIAGNOSIS — C9110 Chronic lymphocytic leukemia of B-cell type not having achieved remission: Secondary | ICD-10-CM

## 2019-09-09 IMAGING — CT CT HEAR MORPH WITH CTA COR WITH SCORE WITH CA WITH CONTRAST AND
1 of 8 series · 9 of 20 positions shown, 12 images · IV contrast (APPLIED)
Comparison: CTA of the chest of 10/15/2018.
COMPARISON: CTA of the chest of 10/15/2018.
COMPARISON: CTA of the chest of 10/15/2018.

Addendum:
EXAM:
OVER-READ INTERPRETATION  CT CHEST

The following report is an over-read performed by radiologist Dr.
Ellaz Matusalem [REDACTED] on 10/17/2018. This over-read
does not include interpretation of cardiac or coronary anatomy or
pathology. The coronary CTA interpretation by the cardiologist is
attached.
CLINICAL DATA: Chest pain
Cardiac CTA
MEDICATIONS:
Sub lingual nitro. 4mg x 2
TECHNIQUE: The patient was scanned on a Siemens [REDACTED]ice scanner. Gantry
rotation speed was 250 msecs. Collimation was 0.6 mm. A 100 kV
prospective scan was triggered in the ascending thoracic aorta at
35-75% of the R-R interval. Average HR during the scan was 60 bpm.
The 3D data set was interpreted on a dedicated work station using
MPR, MIP and VRT modes. A total of 80cc of contrast was used.

[Series 10: multiphase · axial · 0.39mm/px · z∈[-68,+50]mm · 9 of 2214 slices shown, 12 images]
[im 222/2214  vessel]
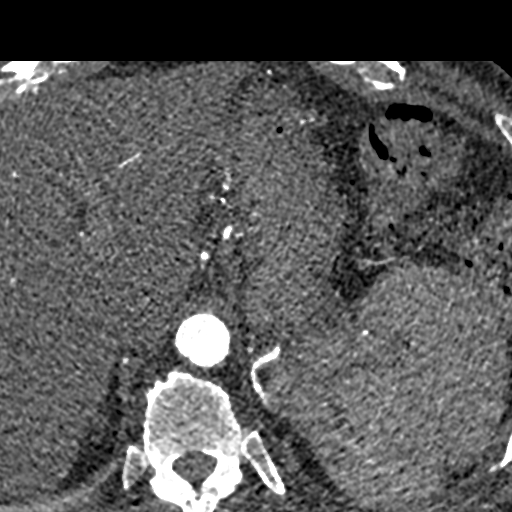
[im 222/2214  lung]
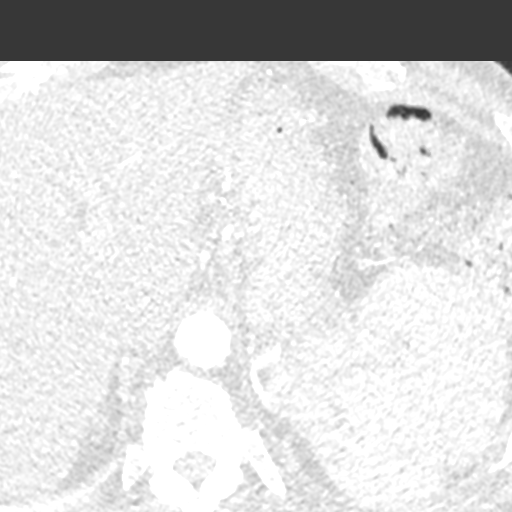
[im 443/2214  vessel]
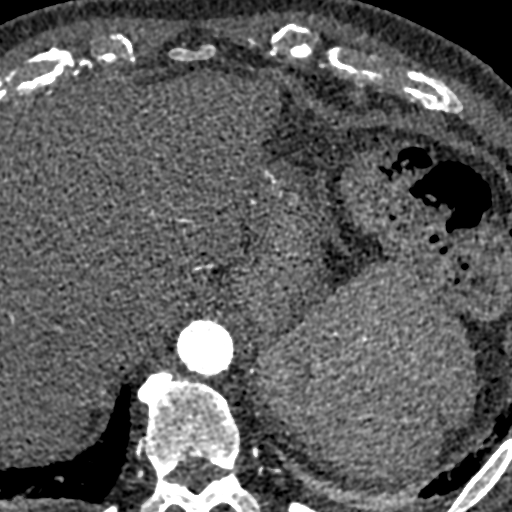
[im 664/2214  vessel]
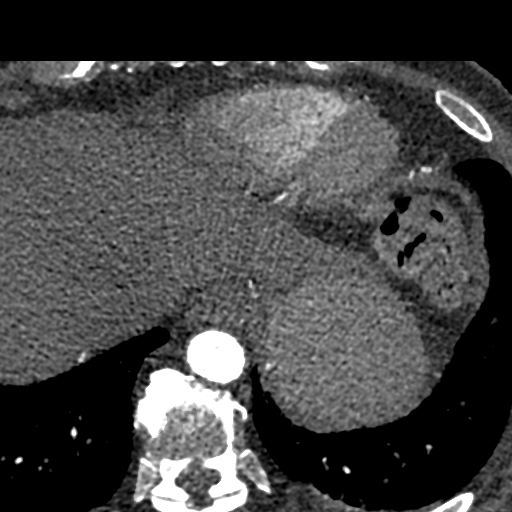
[im 886/2214  vessel]
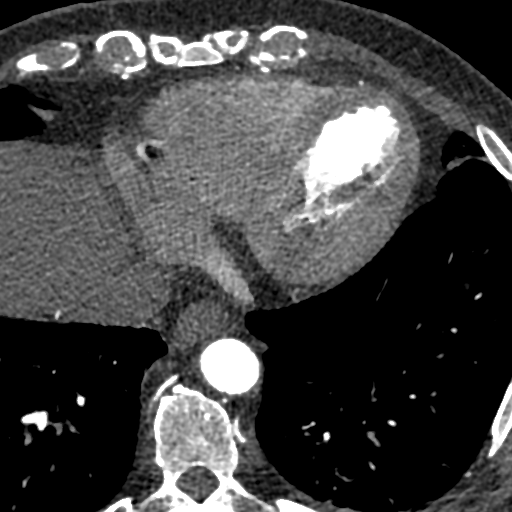
[im 1107/2214  vessel]
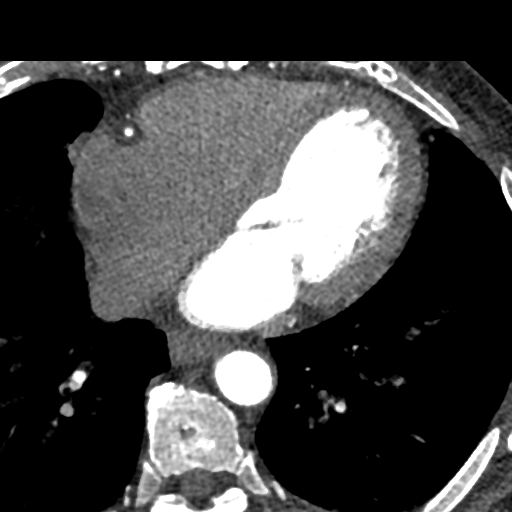
[im 1107/2214  lung]
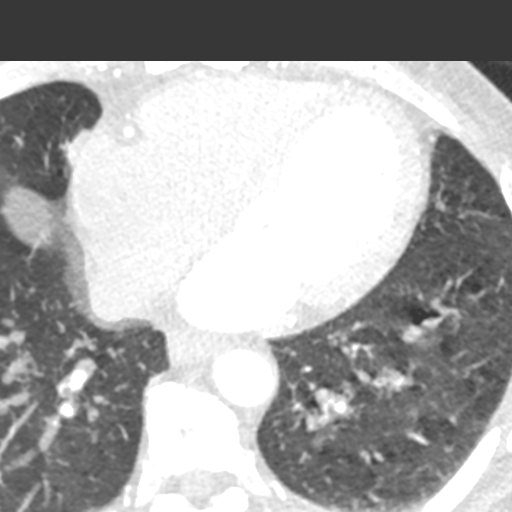
[im 1328/2214  vessel]
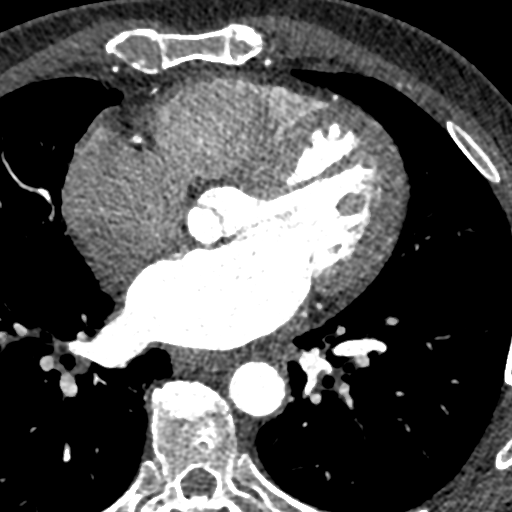
[im 1550/2214  vessel]
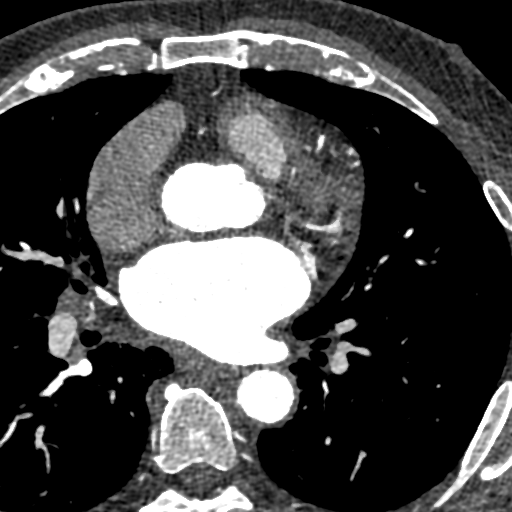
[im 1771/2214  vessel]
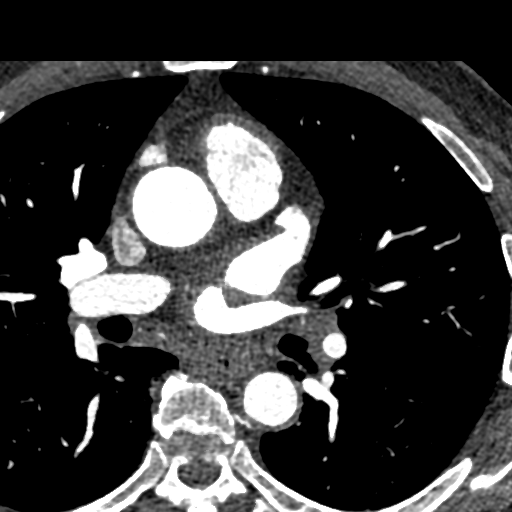
[im 1992/2214  vessel]
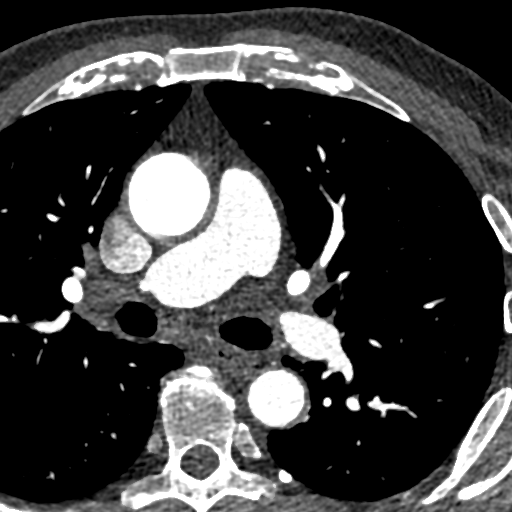
[im 1992/2214  lung]
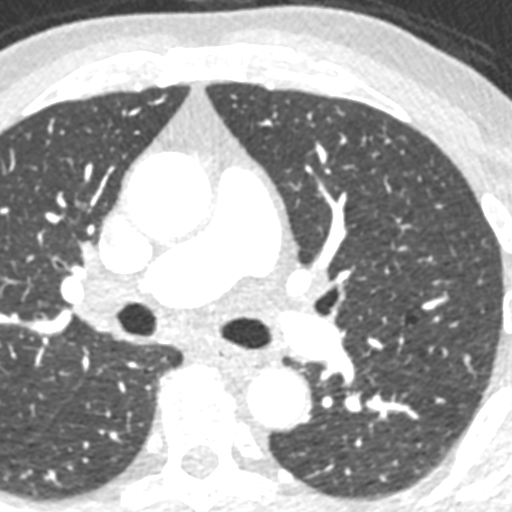

[9 of 20 positions shown; findings below may reference images not displayed]

FINDINGS: Vascular: Aortic atherosclerosis. Tortuous thoracic aorta. No
central pulmonary embolism, on this non-dedicated study.

Mediastinum/Nodes: Mediastinal and bilateral axillary adenopathy Was
detailed on recent CTA.

Lungs/Pleura: Tiny bilateral pleural effusions. Mild bibasilar
atelectasis. A left lower lobe pulmonary nodule measures 9 mm on
image [DATE] and is consistent with a benign etiology based on
presence 10 years ago.

Upper Abdomen: Splenomegaly. Normal imaged portions of the liver,
stomach, right adrenal gland.

Musculoskeletal: Moderate thoracic spondylosis.
IMPRESSION: 1.  No acute findings in the imaged extracardiac chest.
2. Chronic lymphocytic leukemia with splenomegaly and thoracic
adenopathy, as on recent CTA chest.
3. Benign left lower lobe pulmonary nodule.
4. Development of trace bilateral pleural fluid and dependent
bibasilar atelectasis.
FINDINGS: Non-cardiac: See separate report from [REDACTED].

Pulmonary veins drain normally to the left atrium.

Calcium Score: 63 Agatston units.

Coronary Arteries: Right dominant with no anomalies

LM: No plaque or stenosis.

LAD system: Calcified plaque in the proximal LAD just distal to D1,
suspect mild (<50%) stenosis.

Circumflex system: No plaque or stenosis noted. There is a moderate
OM1 that is affected by motion artifact. No calcified plaque seen
and suspect probably no significant disease.

RCA system: Distal RCA with long occluded segment. This may be
chronic given flow in PDA and NO PROB, likely from collaterals.
IMPRESSION: 1. Coronary artery calcium score 63 Agatston units. This places the
patient in the 23rd percentile for age and gender, suggesting low
risk for future cardiac events.

2. Long occluded segment in the distal RCA. The PDA and NO PROB fill
with contrast, suggesting collaterals.

3.  Mild stenosis proximal LAD.

4. Artifact obscures a portion of OM1 but doubt significant disease.

Will send study for FFR.

Mulliah Ubdhoot

ADDENDUM:
CT FFR was done.

1.  Distal RCA occluded.

2.  LAD disease does not appear significant (FFR 0.84 mid LAD).

3.  No significant LCx disease.

Oleg Galaz

*** End of Addendum ***
Addendum:
EXAM:
OVER-READ INTERPRETATION  CT CHEST

The following report is an over-read performed by radiologist Dr.
Ellaz Matusalem [REDACTED] on 10/17/2018. This over-read
does not include interpretation of cardiac or coronary anatomy or
pathology. The coronary CTA interpretation by the cardiologist is
attached.
FINDINGS: Vascular: Aortic atherosclerosis. Tortuous thoracic aorta. No
central pulmonary embolism, on this non-dedicated study.

Mediastinum/Nodes: Mediastinal and bilateral axillary adenopathy Was
detailed on recent CTA.

Lungs/Pleura: Tiny bilateral pleural effusions. Mild bibasilar
atelectasis. A left lower lobe pulmonary nodule measures 9 mm on
image [DATE] and is consistent with a benign etiology based on
presence 10 years ago.

Upper Abdomen: Splenomegaly. Normal imaged portions of the liver,
stomach, right adrenal gland.

Musculoskeletal: Moderate thoracic spondylosis.
IMPRESSION: 1.  No acute findings in the imaged extracardiac chest.
2. Chronic lymphocytic leukemia with splenomegaly and thoracic
adenopathy, as on recent CTA chest.
3. Benign left lower lobe pulmonary nodule.
4. Development of trace bilateral pleural fluid and dependent
bibasilar atelectasis.
FINDINGS: Non-cardiac: See separate report from [REDACTED].

Pulmonary veins drain normally to the left atrium.

Calcium Score: 63 Agatston units.

Coronary Arteries: Right dominant with no anomalies

LM: No plaque or stenosis.

LAD system: Calcified plaque in the proximal LAD just distal to D1,
suspect mild (<50%) stenosis.

Circumflex system: No plaque or stenosis noted. There is a moderate
OM1 that is affected by motion artifact. No calcified plaque seen
and suspect probably no significant disease.

RCA system: Distal RCA with long occluded segment. This may be
chronic given flow in PDA and NO PROB, likely from collaterals.
IMPRESSION: 1. Coronary artery calcium score 63 Agatston units. This places the
patient in the 23rd percentile for age and gender, suggesting low
risk for future cardiac events.

2. Long occluded segment in the distal RCA. The PDA and NO PROB fill
with contrast, suggesting collaterals.

3.  Mild stenosis proximal LAD.

4. Artifact obscures a portion of OM1 but doubt significant disease.

Will send study for FFR.

Mulliah Ubdhoot

*** End of Addendum ***
EXAM:
OVER-READ INTERPRETATION  CT CHEST

The following report is an over-read performed by radiologist Dr.
Ellaz Matusalem [REDACTED] on 10/17/2018. This over-read
does not include interpretation of cardiac or coronary anatomy or
pathology. The coronary CTA interpretation by the cardiologist is
attached.
FINDINGS: Vascular: Aortic atherosclerosis. Tortuous thoracic aorta. No
central pulmonary embolism, on this non-dedicated study.

Mediastinum/Nodes: Mediastinal and bilateral axillary adenopathy Was
detailed on recent CTA.

Lungs/Pleura: Tiny bilateral pleural effusions. Mild bibasilar
atelectasis. A left lower lobe pulmonary nodule measures 9 mm on
image [DATE] and is consistent with a benign etiology based on
presence 10 years ago.

Upper Abdomen: Splenomegaly. Normal imaged portions of the liver,
stomach, right adrenal gland.

Musculoskeletal: Moderate thoracic spondylosis.
IMPRESSION: 1.  No acute findings in the imaged extracardiac chest.
2. Chronic lymphocytic leukemia with splenomegaly and thoracic
adenopathy, as on recent CTA chest.
3. Benign left lower lobe pulmonary nodule.
4. Development of trace bilateral pleural fluid and dependent
bibasilar atelectasis.

## 2019-09-11 LAB — CUP PACEART REMOTE DEVICE CHECK
Date Time Interrogation Session: 20210322001528
Implantable Pulse Generator Implant Date: 20200430

## 2019-09-12 ENCOUNTER — Ambulatory Visit (INDEPENDENT_AMBULATORY_CARE_PROVIDER_SITE_OTHER): Payer: Medicare PPO | Admitting: *Deleted

## 2019-09-12 DIAGNOSIS — R55 Syncope and collapse: Secondary | ICD-10-CM | POA: Diagnosis not present

## 2019-09-12 NOTE — Progress Notes (Signed)
ILR Remote 

## 2019-09-13 MED FILL — CALQUENCE 100 MG CAPSULE: 100 | 30 days supply | Qty: 30 | Fill #0

## 2019-10-02 IMAGING — CT CT BONE MARROW BIOPSY AND ASPIRATION
1 of 2 series · 15 of 28 positions shown, 19 images · non-contrast
Comparison: none

CLINICAL DATA: History of CLL; chronic renal disease,
thrombocytopenia progressive despite prednisone

EXAM:
CT GUIDED DEEP ILIAC BONE ASPIRATION AND CORE BIOPSY
TECHNIQUE: Patient was placed prone on the CT gantry and limited axial scans
through the pelvis were obtained. Appropriate skin entry site was
identified. Skin site was marked, prepped with chlorhexidine, draped
in usual sterile fashion, and infiltrated locally with 1% lidocaine.

[Series 2: i-spiral 5.0 b40f · axial · 0.98mm/px · z∈[+1388,+1455]mm · 15 of 22 slices shown, 19 images]
[im 2/22  mediastinal]
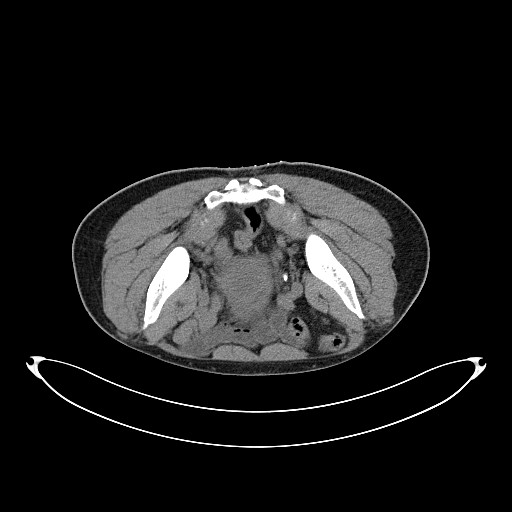
[im 2/22  lung]
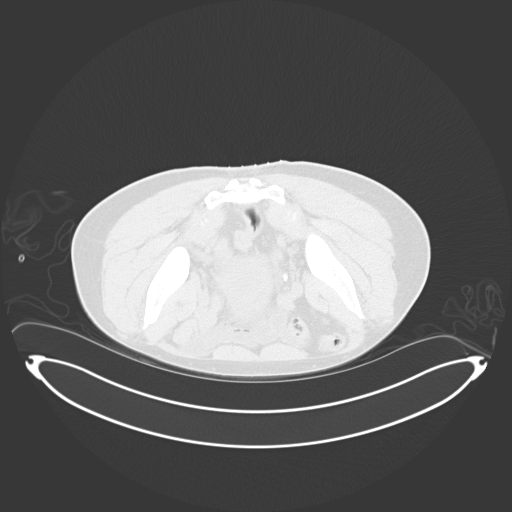
[im 4/22  lung]
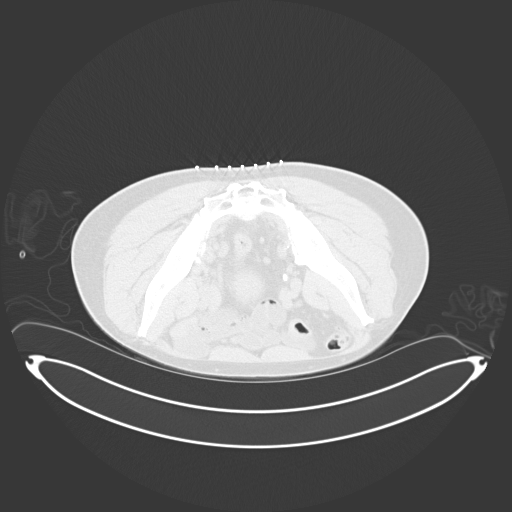
[im 5/22  lung]
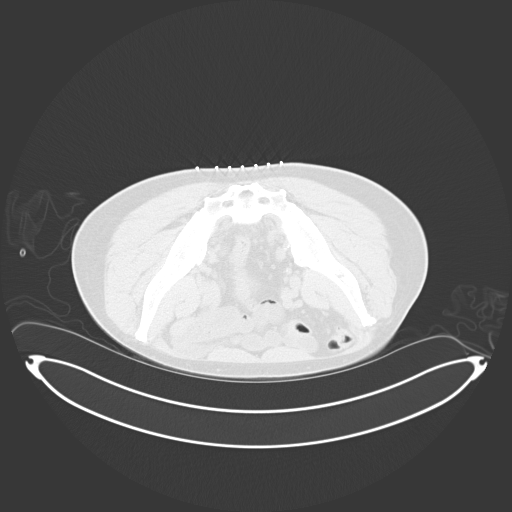
[im 6/22  lung]
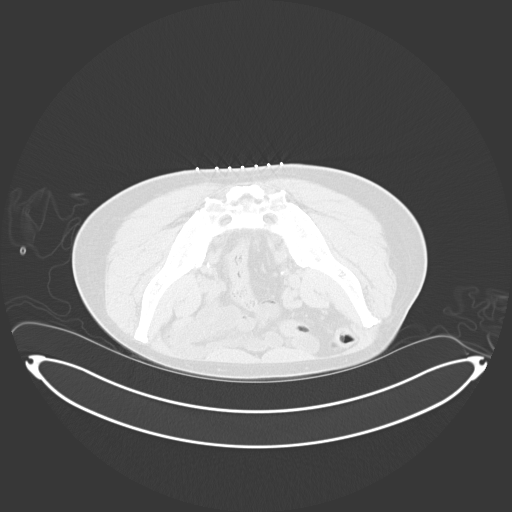
[im 8/22  mediastinal]
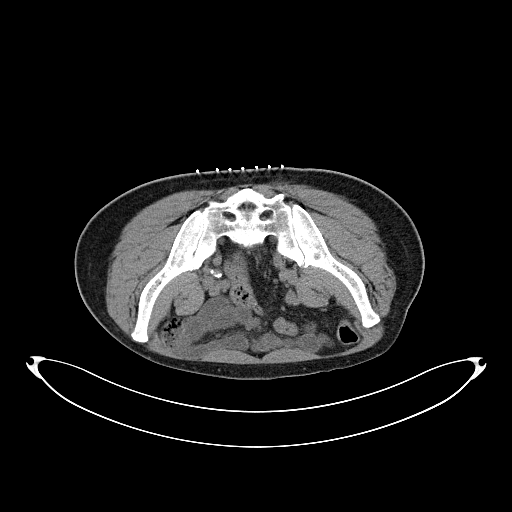
[im 8/22  lung]
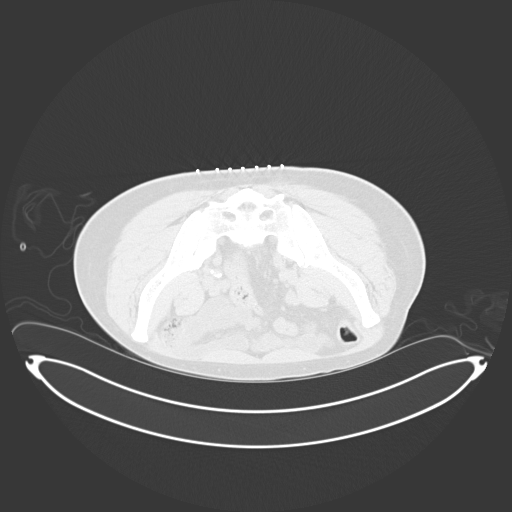
[im 9/22  lung]
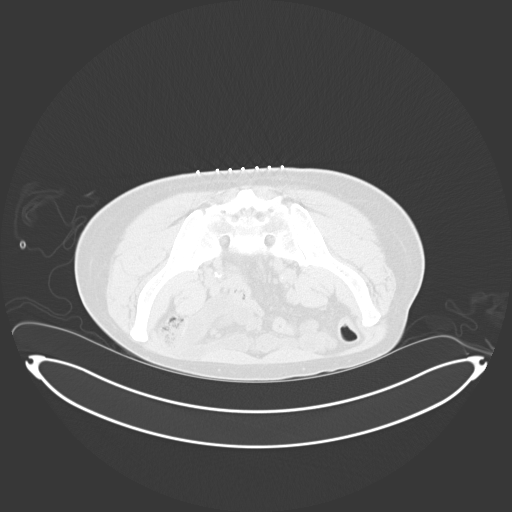
[im 10/22  lung]
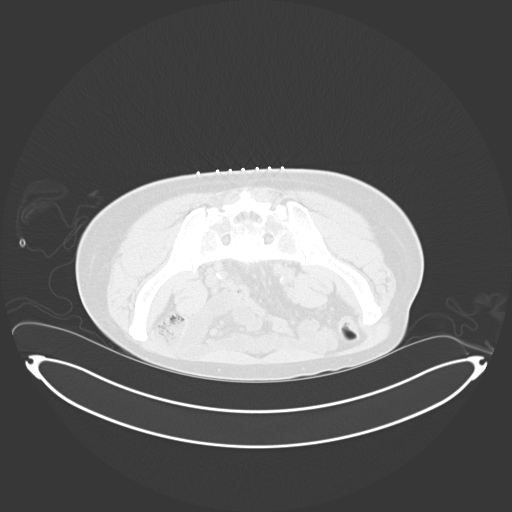
[im 12/22  lung]
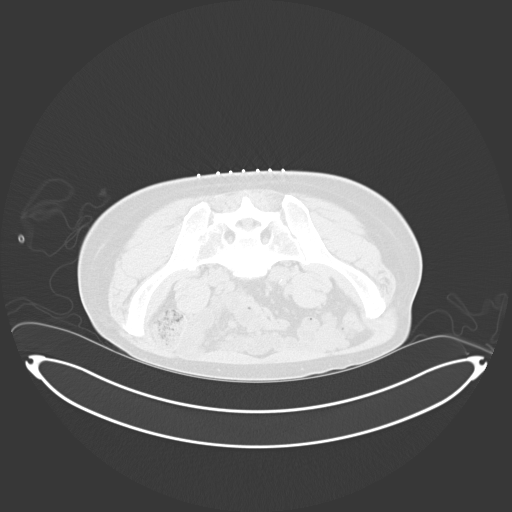
[im 13/22  mediastinal]
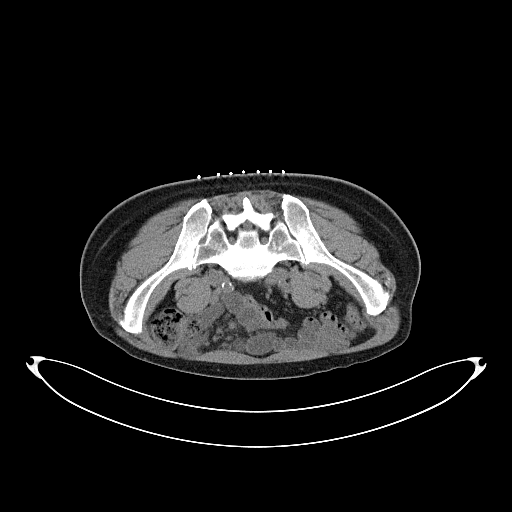
[im 13/22  lung]
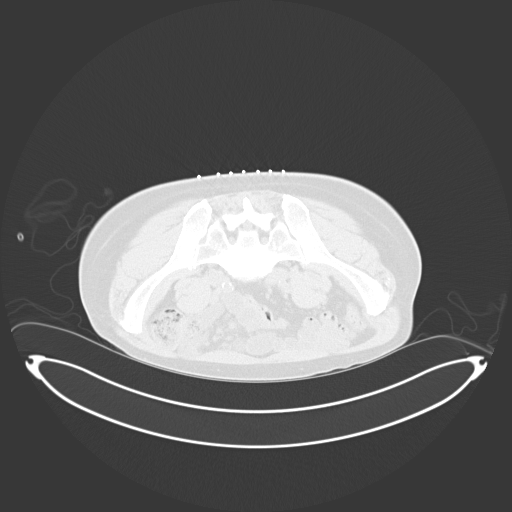
[im 14/22  lung]
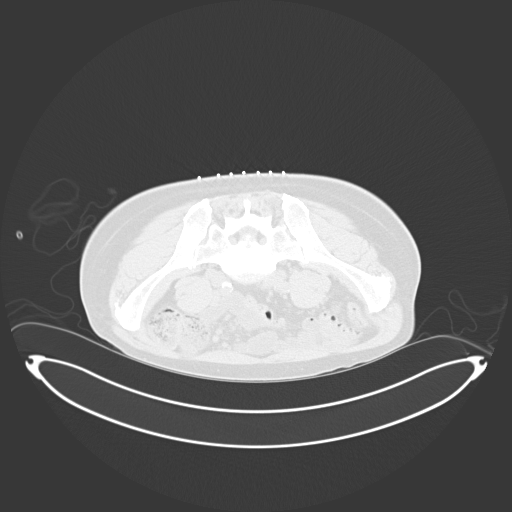
[im 16/22  lung]
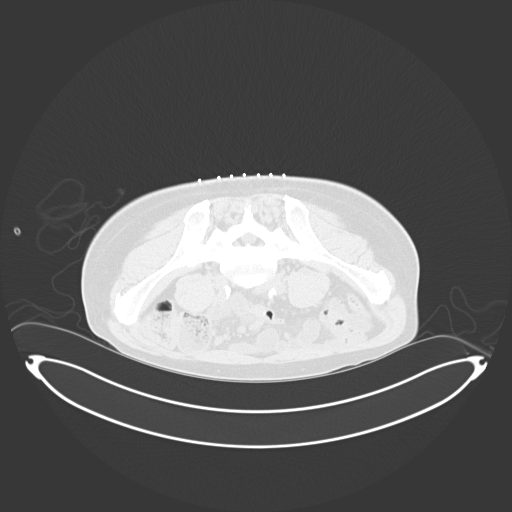
[im 17/22  lung]
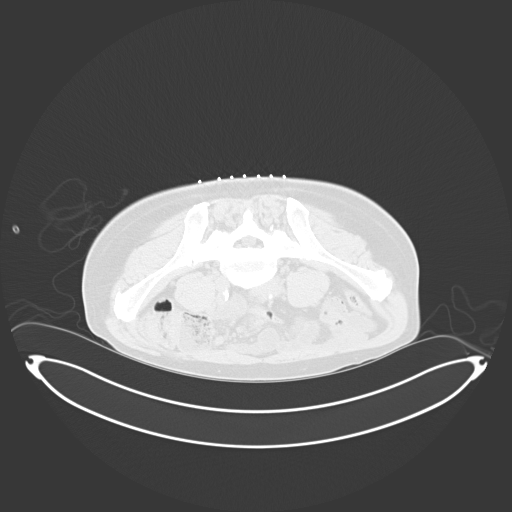
[im 18/22  mediastinal]
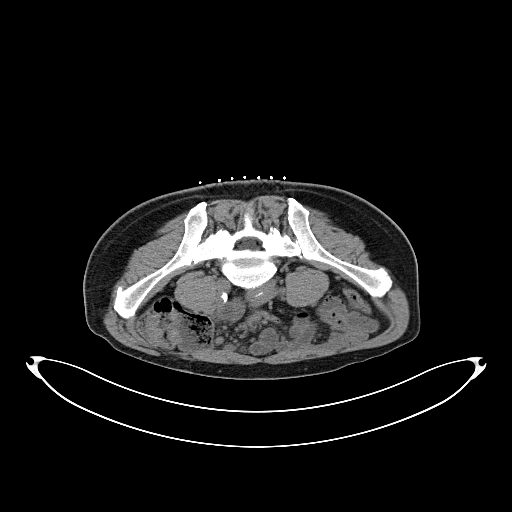
[im 18/22  lung]
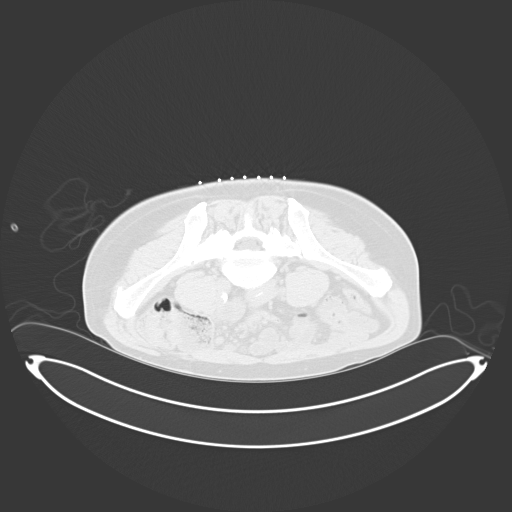
[im 20/22  lung]
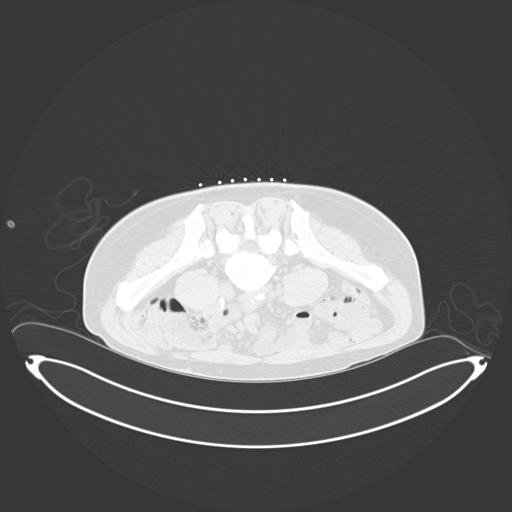
[im 21/22  lung]
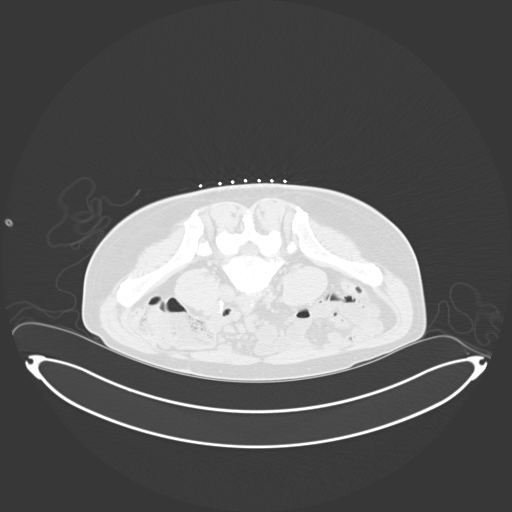

[15 of 28 positions shown; findings below may reference images not displayed]

Intravenous Fentanyl 111mcg and Versed 2mg were administered as
conscious sedation during continuous monitoring of the patient's
level of consciousness and physiological / cardiorespiratory status
by the radiology RN, with a total moderate sedation time of 10
minutes.

Under CT fluoroscopic guidance an 11-gauge Cook trocar bone needle
was advanced into the right iliac bone just lateral to the
sacroiliac joint. Once needle tip position was confirmed, core and
aspiration samples were obtained, submitted to pathology for
approval. Post procedure scans show no hematoma or fracture. Patient
tolerated procedure well.

COMPLICATIONS:
COMPLICATIONS
none
IMPRESSION: 1. Technically successful CT guided right iliac bone core and
aspiration biopsy.

## 2019-10-10 ENCOUNTER — Ambulatory Visit (INDEPENDENT_AMBULATORY_CARE_PROVIDER_SITE_OTHER): Payer: Medicare PPO | Admitting: *Deleted

## 2019-10-10 ENCOUNTER — Telehealth: Payer: Self-pay | Admitting: Oncology

## 2019-10-10 ENCOUNTER — Inpatient Hospital Stay: Payer: Medicare PPO | Attending: Oncology | Admitting: Oncology

## 2019-10-10 ENCOUNTER — Other Ambulatory Visit: Payer: Self-pay

## 2019-10-10 ENCOUNTER — Inpatient Hospital Stay: Payer: Medicare PPO

## 2019-10-10 VITALS — BP 157/81 | HR 49 | Temp 97.8°F | Resp 16 | Ht 63.0 in | Wt 163.8 lb

## 2019-10-10 DIAGNOSIS — Z87440 Personal history of urinary (tract) infections: Secondary | ICD-10-CM | POA: Insufficient documentation

## 2019-10-10 DIAGNOSIS — R55 Syncope and collapse: Secondary | ICD-10-CM | POA: Diagnosis not present

## 2019-10-10 DIAGNOSIS — C9110 Chronic lymphocytic leukemia of B-cell type not having achieved remission: Secondary | ICD-10-CM | POA: Diagnosis not present

## 2019-10-10 DIAGNOSIS — D696 Thrombocytopenia, unspecified: Secondary | ICD-10-CM | POA: Diagnosis not present

## 2019-10-10 LAB — CUP PACEART REMOTE DEVICE CHECK
Date Time Interrogation Session: 20210422010249
Implantable Pulse Generator Implant Date: 20200430

## 2019-10-10 LAB — CMP (CANCER CENTER ONLY)
ALT: 13 U/L (ref 0–44)
AST: 15 U/L (ref 15–41)
Albumin: 4.1 g/dL (ref 3.5–5.0)
Alkaline Phosphatase: 74 U/L (ref 38–126)
Anion gap: 5 (ref 5–15)
BUN: 23 mg/dL (ref 8–23)
CO2: 23 mmol/L (ref 22–32)
Calcium: 8.8 mg/dL — ABNORMAL LOW (ref 8.9–10.3)
Chloride: 110 mmol/L (ref 98–111)
Creatinine: 1.33 mg/dL — ABNORMAL HIGH (ref 0.61–1.24)
GFR, Est AFR Am: 59 mL/min — ABNORMAL LOW (ref >60)
GFR, Estimated: 51 mL/min — ABNORMAL LOW (ref >60)
Glucose, Bld: 88 mg/dL (ref 70–99)
Potassium: 4.2 mmol/L (ref 3.5–5.1)
Sodium: 138 mmol/L (ref 135–145)
Total Bilirubin: 0.9 mg/dL (ref 0.3–1.2)
Total Protein: 6.3 g/dL — ABNORMAL LOW (ref 6.5–8.1)

## 2019-10-10 LAB — CBC WITH DIFFERENTIAL (CANCER CENTER ONLY)
Abs Immature Granulocytes: 0.03 10*3/uL (ref 0.00–0.07)
Basophils Absolute: 0.1 10*3/uL (ref 0.0–0.1)
Basophils Relative: 1 %
Eosinophils Absolute: 0.2 10*3/uL (ref 0.0–0.5)
Eosinophils Relative: 2 %
HCT: 46.5 % (ref 39.0–52.0)
Hemoglobin: 14.8 g/dL (ref 13.0–17.0)
Immature Granulocytes: 0 %
Lymphocytes Relative: 54 %
Lymphs Abs: 8.2 10*3/uL — ABNORMAL HIGH (ref 0.7–4.0)
MCH: 30.3 pg (ref 26.0–34.0)
MCHC: 31.8 g/dL (ref 30.0–36.0)
MCV: 95.1 fL (ref 80.0–100.0)
Monocytes Absolute: 0.8 10*3/uL (ref 0.1–1.0)
Monocytes Relative: 5 %
Neutro Abs: 5.7 10*3/uL (ref 1.7–7.7)
Neutrophils Relative %: 38 %
Platelet Count: 100 10*3/uL — ABNORMAL LOW (ref 150–400)
RBC: 4.89 MIL/uL (ref 4.22–5.81)
RDW: 12.7 % (ref 11.5–15.5)
WBC Count: 15.1 10*3/uL — ABNORMAL HIGH (ref 4.0–10.5)
nRBC: 0 % (ref 0.0–0.2)

## 2019-10-10 NOTE — Telephone Encounter (Signed)
Scheduled per los. Gave avs and calendar  

## 2019-10-10 NOTE — Patient Instructions (Signed)
Please provide a copy of your Medical Advanced Directive to have scanned into your record 

## 2019-10-10 NOTE — Progress Notes (Signed)
[facility] OFFICE PROGRESS NOTE   Diagnosis: CLL  INTERVAL HISTORY:   [name] returns as scheduled.  He feels well.  No bleeding or falls.  He is exercising.  He continues at acalabrutinib.  Objective:  Vital signs in last 24 hours:  Blood pressure (!) 157/81, pulse (!) 49, temperature 97.8 F (36.6 C), temperature source Temporal, resp. rate 16, height 5' 3"  (1.6 m), weight 163 lb 12.8 oz (74.3 kg), SpO2 100 %.    Lymphatics: No cervical, supraclavicular, axillary, or inguinal nodes Resp: Lungs clear bilaterally Cardio: Regular rate and rhythm GI: No hepatosplenomegaly Vascular: No leg edema    Lab Results:  Lab Results  Component Value Date   WBC 15.1 (H) 10/10/2019   HGB 14.8 10/10/2019   HCT 46.5 10/10/2019   MCV 95.1 10/10/2019   PLT 100 (L) 10/10/2019   NEUTROABS 5.7 10/10/2019    CMP  Lab Results  Component Value Date   NA 144 08/12/2019   K 4.3 08/12/2019   CL 108 08/12/2019   CO2 26 08/12/2019   GLUCOSE 79 08/12/2019   BUN 28 (H) 08/12/2019   CREATININE 1.40 (H) 08/12/2019   CALCIUM 8.9 08/12/2019   PROT 6.2 (L) 08/12/2019   ALBUMIN 4.1 08/12/2019   AST 13 (L) 08/12/2019   ALT 12 08/12/2019   ALKPHOS 80 08/12/2019   BILITOT 0.6 08/12/2019   GFRNONAA 48 (L) 08/12/2019   GFRAA 55 (L) 08/12/2019     Imaging:  CUP PACEART REMOTE DEVICE CHECK  Result Date: 10/10/2019 [facility] summary report received. Battery status OK. Normal device function. No new symptom episodes, tachy episodes, brady, or pause episodes. No new AF episodes. Monthly summary reports and ROV/PRN Kathy Breach, RN, CCDS, [employer]   Medications: I have reviewed the patient's current medications.   Assessment/Plan: 1. CLL ? Review of the peripheral blood smear is consistent with chronic lymphocytic leukemia ? 12/16/2014 Peripheral blood flow cytometry consistent with CLL ? 10/16/2018 MRI brain and cervical spine - Prominent bulky cervical  adenopathy within the visualized neck,compatible with history of CLL. ? FISH analysis- gain of 13q; 17p and 11q not detected ? Acalabrutinib 11/22/2018 2.thrombocytopenia-likely secondary to chronic lymphocytic leukemia versus "pseudo thrombocytopenia"due to platelet clumping; progressive thrombocytopenia June 2018  Trial of prednisone started 10/17/2018 after admission with a fall/subarachnoid hemorrhage-starting dose 60 mg daily  Prednisone tapered to 40 mg daily beginning 10/22/2018  Prednisone taper to 30 mg daily beginning 10/29/2018, tapered to off over 6 days beginning 11/13/2018  Bone marrow biopsy 11/09/2018-hypercellular marrow with prominent involvement by CLL, abundant megakaryocytes, lymphocytes represent 82% of all cells, the cytogenetics returned normal 3.Hospitalization with sepsis/UTI with bacteremia (Enterobacter aerogenes)March 2018 4. Hospitalization for syncopal episode resulting in fall. Developed a small right subarachnoid hemorrhage.  5. MRI brain 10/16/2018 - Focal 1-2 cm osseous lesions involving the right parietal and occipital calvarium as above, indeterminate. Findings are of uncertain significance, with no definite corresponding osseous lesion seen on prior CT. 6.  History of idiopathic "anaphylactoid "reactions 7.  Paroxysmal atrial fibrillation atrial fibrillation noted on a cardiac monitor 2020, not placed on anticoagulation secondary to thrombocytopenia     Disposition: [name] appears stable.  He is tolerating the acalabrutinib well and remains in partial remission from the CLL.  He will continue acalabrutinib.  [name] will return for a lab visit in 3 months.  He will be scheduled for a 23-monthoffice visit.  GBetsy Coder MD  10/10/2019  9:00 AM

## 2019-10-11 NOTE — Progress Notes (Signed)
ILR Remote 

## 2019-10-15 MED FILL — CALQUENCE 100 MG CAPSULE: 100 | 30 days supply | Qty: 30 | Fill #1

## 2019-11-11 ENCOUNTER — Other Ambulatory Visit: Payer: Self-pay | Admitting: Oncology

## 2019-11-11 DIAGNOSIS — C9110 Chronic lymphocytic leukemia of B-cell type not having achieved remission: Secondary | ICD-10-CM

## 2019-11-11 LAB — CUP PACEART REMOTE DEVICE CHECK
Date Time Interrogation Session: 20210523023509
Implantable Pulse Generator Implant Date: 20200430

## 2019-11-12 ENCOUNTER — Ambulatory Visit (INDEPENDENT_AMBULATORY_CARE_PROVIDER_SITE_OTHER): Payer: Medicare PPO | Admitting: *Deleted

## 2019-11-12 DIAGNOSIS — R55 Syncope and collapse: Secondary | ICD-10-CM

## 2019-11-12 NOTE — Progress Notes (Signed)
Carelink Summary Report / Loop Recorder 

## 2019-11-14 MED FILL — CALQUENCE 100 MG CAPSULE: 100 | 30 days supply | Qty: 30 | Fill #0

## 2019-12-13 ENCOUNTER — Telehealth: Payer: Self-pay | Admitting: *Deleted

## 2019-12-13 NOTE — Telephone Encounter (Signed)
His daughter, who is a PCP has found a research study that states that patient's with CLL only mount 13% immunity w/COVID vaccine. He is asking Dr. Gearldine Shown opinion on this and if he should try to get a 3rd vaccine if that is possible?

## 2019-12-16 ENCOUNTER — Ambulatory Visit (INDEPENDENT_AMBULATORY_CARE_PROVIDER_SITE_OTHER): Payer: Medicare PPO | Admitting: *Deleted

## 2019-12-16 DIAGNOSIS — R55 Syncope and collapse: Secondary | ICD-10-CM | POA: Diagnosis not present

## 2019-12-16 LAB — CUP PACEART REMOTE DEVICE CHECK
Date Time Interrogation Session: 20210627235946
Implantable Pulse Generator Implant Date: 20200430

## 2019-12-16 MED FILL — CALQUENCE 100 MG CAPSULE: 100 | 30 days supply | Qty: 30 | Fill #1

## 2019-12-17 NOTE — Progress Notes (Signed)
Carelink Summary Report / Loop Recorder 

## 2019-12-19 ENCOUNTER — Telehealth: Payer: Self-pay | Admitting: *Deleted

## 2019-12-19 NOTE — Telephone Encounter (Signed)
Received vm call from pt stating that he is disappointed that Dr Benay Spice hasn't gotten back with him regarding limited effects of Covid with CLL. He states he knows there aren't enough studies but would like to talk with Dr Benay Spice about this matter.  Message to Dr Benay Spice.

## 2020-01-06 ENCOUNTER — Other Ambulatory Visit: Payer: Self-pay | Admitting: Oncology

## 2020-01-06 DIAGNOSIS — C9110 Chronic lymphocytic leukemia of B-cell type not having achieved remission: Secondary | ICD-10-CM

## 2020-01-09 ENCOUNTER — Other Ambulatory Visit: Payer: Self-pay

## 2020-01-09 ENCOUNTER — Telehealth: Payer: Self-pay

## 2020-01-09 ENCOUNTER — Inpatient Hospital Stay: Payer: Medicare PPO | Attending: Oncology

## 2020-01-09 DIAGNOSIS — D696 Thrombocytopenia, unspecified: Secondary | ICD-10-CM | POA: Insufficient documentation

## 2020-01-09 DIAGNOSIS — Z79899 Other long term (current) drug therapy: Secondary | ICD-10-CM | POA: Insufficient documentation

## 2020-01-09 DIAGNOSIS — C9110 Chronic lymphocytic leukemia of B-cell type not having achieved remission: Secondary | ICD-10-CM | POA: Diagnosis not present

## 2020-01-09 LAB — CBC WITH DIFFERENTIAL (CANCER CENTER ONLY)
Abs Immature Granulocytes: 0.03 10*3/uL (ref 0.00–0.07)
Basophils Absolute: 0.1 10*3/uL (ref 0.0–0.1)
Basophils Relative: 1 %
Eosinophils Absolute: 0.3 10*3/uL (ref 0.0–0.5)
Eosinophils Relative: 2 %
HCT: 46.7 % (ref 39.0–52.0)
Hemoglobin: 15.3 g/dL (ref 13.0–17.0)
Immature Granulocytes: 0 %
Lymphocytes Relative: 42 %
Lymphs Abs: 5.5 10*3/uL — ABNORMAL HIGH (ref 0.7–4.0)
MCH: 30.6 pg (ref 26.0–34.0)
MCHC: 32.8 g/dL (ref 30.0–36.0)
MCV: 93.4 fL (ref 80.0–100.0)
Monocytes Absolute: 0.7 10*3/uL (ref 0.1–1.0)
Monocytes Relative: 6 %
Neutro Abs: 6.4 10*3/uL (ref 1.7–7.7)
Neutrophils Relative %: 49 %
Platelet Count: 99 10*3/uL — ABNORMAL LOW (ref 150–400)
RBC: 5 MIL/uL (ref 4.22–5.81)
RDW: 12.3 % (ref 11.5–15.5)
WBC Count: 13.1 10*3/uL — ABNORMAL HIGH (ref 4.0–10.5)
nRBC: 0 % (ref 0.0–0.2)

## 2020-01-09 NOTE — Telephone Encounter (Signed)
Spoke with patient updates on lab results and new recommendations pt understands to follow up as scheduled

## 2020-01-09 NOTE — Telephone Encounter (Signed)
-----   Message from Ladell Pier, MD sent at 01/09/2020  1:04 PM EDT ----- Please call patient, hemoglobin and platelets are stable, lymphocyte count appears to be slowly declining, continue acalabrutinib, follow-up as scheduled

## 2020-01-14 MED FILL — CALQUENCE 100 MG CAPSULE: 100 | 30 days supply | Qty: 30 | Fill #0

## 2020-01-20 ENCOUNTER — Ambulatory Visit (INDEPENDENT_AMBULATORY_CARE_PROVIDER_SITE_OTHER): Payer: Medicare PPO | Admitting: *Deleted

## 2020-01-20 DIAGNOSIS — R55 Syncope and collapse: Secondary | ICD-10-CM | POA: Diagnosis not present

## 2020-01-20 LAB — CUP PACEART REMOTE DEVICE CHECK
Date Time Interrogation Session: 20210731000319
Implantable Pulse Generator Implant Date: 20200430

## 2020-01-22 NOTE — Progress Notes (Signed)
Carelink Summary Report / Loop Recorder 

## 2020-01-27 ENCOUNTER — Other Ambulatory Visit: Payer: Self-pay

## 2020-01-27 ENCOUNTER — Ambulatory Visit: Payer: Medicare PPO | Admitting: Cardiology

## 2020-01-27 ENCOUNTER — Encounter: Payer: Self-pay | Admitting: Cardiology

## 2020-01-27 VITALS — BP 122/70 | HR 54 | Ht 63.0 in | Wt 165.0 lb

## 2020-01-27 DIAGNOSIS — R55 Syncope and collapse: Secondary | ICD-10-CM | POA: Diagnosis not present

## 2020-01-27 DIAGNOSIS — I480 Paroxysmal atrial fibrillation: Secondary | ICD-10-CM

## 2020-01-27 DIAGNOSIS — C9110 Chronic lymphocytic leukemia of B-cell type not having achieved remission: Secondary | ICD-10-CM | POA: Diagnosis not present

## 2020-01-27 DIAGNOSIS — I2510 Atherosclerotic heart disease of native coronary artery without angina pectoris: Secondary | ICD-10-CM

## 2020-01-27 DIAGNOSIS — I609 Nontraumatic subarachnoid hemorrhage, unspecified: Secondary | ICD-10-CM

## 2020-01-27 NOTE — Progress Notes (Signed)
Cardiology Office Note:    Date:  01/27/2020   ID:  [name], DOB 21-Mar-1941, MRN [id number]  PCP:  [name], MD  [facility] Cardiologist:  Candee Furbish, MD  [facility] Electrophysiologist:  Will [name], MD   Referring MD: [name], MD    History of Present Illness:    [name] is a 79 y.o. male with  PAF, CAD. CLL.  Had a right small subarachnoid hemorrhage after syncopal episode resulting in a fall.  Has not been on anticoagulation, thankfully no further episodes of PAF.  Walking well. Never misses. Yoga once a week.   Concerns him that mildly winded with walking. 1.2/10 pressure when walking.  Previously had symptoms of chest pressure transiently, but none since.   Feeling stronger at end of walk. 2.51miles.   Data from loop fine, no AFIB.   No more symptoms like syncope.   We once again went over his diagnoses.  Overall denies any fevers chills nausea vomiting syncope bleeding.  CLL has been stable.  Immunocompromised.  [name] wife  Had lengthy discussion today about his diagnoses, reassurance.  Past Medical History:  Diagnosis Date  . Acute encephalopathy 08/17/2016  . Acute renal failure superimposed on stage 3 chronic kidney disease ([place]) 10/15/2018  . AKI (acute kidney injury) ([place]) 08/16/2016  . Arthritis   . Bacteremia due to Enterobacter species 08/19/2016  . Carpal tunnel syndrome, bilateral 10/09/2017  . Chest tightness 10/15/2018  . CLL (chronic lymphocytic leukemia) ([place]) 12/16/2014  . Dyspnea 09/20/2012  . Elevated troponin 10/16/2018  . Enterobacter sepsis ([place]) 08/19/2016  . Fall 10/15/2018  . GERD (gastroesophageal reflux disease)   . Hepatitis 1966   Drug reaction after taking medication  . Hyperglycemia   . Hyperlipemia   . Hypertension   . Hypokalemia 08/16/2016  . Lung nodule seen on imaging study    bilateral lungs  . Nasal bone fracture 10/15/2018  . Right foot pain   . SAH (subarachnoid hemorrhage) ([place])  10/15/2018  . Syncope 10/15/2018  . Thrombocytopenia ([place]) 10/15/2018  . Urinary tract infection with hematuria     Past Surgical History:  Procedure Laterality Date  . APPENDECTOMY    . arthroscoyp  2000   left knee  . COLONOSCOPY W/ POLYPECTOMY    . LOOP RECORDER INSERTION N/A 10/18/2018   Procedure: LOOP RECORDER INSERTION;  Surgeon: [name], MD;  Location: [place] [facility];  Service: Cardiovascular;  Laterality: N/A;  . LUMBAR LAMINECTOMY/DECOMPRESSION MICRODISCECTOMY  06/27/2012   Procedure: LUMBAR LAMINECTOMY/DECOMPRESSION MICRODISCECTOMY 1 LEVEL;  Surgeon: [name], MD;  Location: [place] [facility];  Service: Neurosurgery;  Laterality: Bilateral;  bilateral four-five laminectony  . SKIN CANCER EXCISION  5-6 years ago   moe-s surgery- basal b  . TONSILLECTOMY      Current Medications: Current Meds  Medication Sig  . CALQUENCE 100 MG capsule TAKE 1 CAPSULE (100 MG) BY MOUTH ONCE A DAY.  Marland Kitchen Cholecalciferol (VITAMIN D) 2000 units CAPS Take 2,000 Units by mouth daily.   Marland Kitchen EPINEPHrine (EPIPEN) 0.3 mg/0.3 mL DEVI Inject 0.3 mLs (0.3 mg total) into the muscle once.  . Loratadine 10 MG CAPS Take 10 mg by mouth.  . metoprolol succinate (TOPROL-XL) 25 MG 24 hr tablet Take 25 mg by mouth daily.  . simvastatin (ZOCOR) 40 MG tablet Take 20 mg by mouth daily.      Allergies:   Patient has no known allergies.   Social History   Socioeconomic  History  . Marital status: Married    Spouse name: [name]   . Number of children: 2  . Years of education: PhD  . Highest education level: Not on file  Occupational History  . Occupation: Retired  Tobacco Use  . Smoking status: Never Smoker  . Smokeless tobacco: Never Used  Vaping Use  . Vaping Use: Never used  Substance and Sexual Activity  . Alcohol use: Yes    Alcohol/week: 2.0 standard drinks    Types: 2 Cans of beer per week    Comment: rare 2 beers per month  . Drug use: No  . Sexual activity: Not on file  Other  Topics Concern  . Not on file  Social History Narrative   Lives with wife   Caffeine use: 1 cup coffee per day   Diet coke      Right handed    Social Determinants of Health   Financial Resource Strain:   . Difficulty of Paying Living Expenses:   Food Insecurity:   . Worried About Charity fundraiser in the Last Year:   . Arboriculturist in the Last Year:   Transportation Needs:   . Film/video editor (Medical):   Marland Kitchen Lack of Transportation (Non-Medical):   Physical Activity:   . Days of Exercise per Week:   . Minutes of Exercise per Session:   Stress:   . Feeling of Stress :   Social Connections:   . Frequency of Communication with Friends and Family:   . Frequency of Social Gatherings with Friends and Family:   . Attends Religious Services:   . Active Member of Clubs or Organizations:   . Attends Archivist Meetings:   Marland Kitchen Marital Status:      Family History: The patient's family history includes Dementia in his father; Seizures in his sister; Sudden death (age of onset: 8) in his mother.  ROS:   Please see the history of present illness.     All other systems reviewed and are negative.  EKGs/Labs/Other Studies Reviewed:    The following studies were reviewed today:  CT on 10/17/18 scan of coronary arteries-distal RCA occluded LAD nonsignificant, 0.84 FFR  IMPRESSION: 1. Coronary artery calcium score 63 Agatston units. This places the patient in the 23rd percentile for age and gender, suggesting low risk for future cardiac events.  2. 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 The PDA and PLV fill with contrast, suggesting collaterals.  3.  Mild stenosis proximal LAD.  4. Artifact obscures a portion of OM1 but doubt significant disease.  Echo 2020-EF greater than 65% 25 mm dynamic LV gradient.  Implantable loop recorder-normal sinus rhythm  EKG:  EKG is  ordered today.  The ekg ordered today demonstrates sinus bradycardia 54 with no other  abnormalities  Recent Labs: 10/10/2019: ALT 13; BUN 23; Creatinine 1.33; Potassium 4.2; Sodium 138 01/09/2020: Hemoglobin 15.3; Platelet Count 99  Recent Lipid Panel    Component Value Date/Time   CHOL 106 10/16/2018 0339   TRIG 125 10/16/2018 0339   HDL 20 (L) 10/16/2018 0339   CHOLHDL 5.3 10/16/2018 0339   VLDL 25 10/16/2018 0339   LDLCALC 61 10/16/2018 0339    Physical Exam:    VS:  BP 122/70   Pulse (!) 54   Ht 5\' 3"  (1.6 m)   Wt 165 lb (74.8 kg)   SpO2 97%   BMI 29.23 kg/m     Wt Readings from Last 3 Encounters:  01/27/20 165 lb (74.8 kg)  10/10/19 163 lb 12.8 oz (74.3 kg)  07/24/19 161 lb (73 kg)     GEN:  Well nourished, well developed in no acute distress HEENT: Normal NECK: No JVD; No carotid bruits LYMPHATICS: No lymphadenopathy CARDIAC: RRR, no murmurs, rubs, gallops RESPIRATORY:  Clear to auscultation without rales, wheezing or rhonchi  ABDOMEN: Soft, non-tender, non-distended MUSCULOSKELETAL:  No edema; No deformity  SKIN: Warm and dry NEUROLOGIC:  Alert and oriented x 3 PSYCHIATRIC:  Normal affect   ASSESSMENT:    1. Paroxysmal atrial fibrillation (HCC)   2. Syncope, unspecified syncope type   3. CLL (chronic lymphocytic leukemia) ([place])   4. Coronary artery disease involving native coronary artery of native heart without angina pectoris   5. SAH (subarachnoid hemorrhage) (HCC)    PLAN:    In order of problems listed above:  Paroxysmal atrial fibrillation -Implantable loop recorder data reviewed and no further evidence of atrial fibrillation noted. -This is important because he is not on anticoagulation because of bleeding risk of prior subarachnoid hemorrhage suffered after syncopal episode and fall.  CLL -Followed very closely by [name].  Blood work reviewed demonstrating platelet count in the 90-100 range from outside labs.  Coronary artery disease/angina -Very well controlled, Toprol 25 mg a day.  Minimal bradycardia. Stable. Very  minimal episodes when walking, extremely like chest pressure.  Continue with exercise.  No changes made.  Diet, exercise excellent.  CKD 3a --discussed. Try to aviod NSAIDs  Hyperlipidemia -Simvastatin 40.  I am comfortable with him continuing this.  LDL 51 from outside labs in 2020.  ALT 14.   Medication Adjustments/Labs and Tests Ordered: Current medicines are reviewed at length with the patient today.  Concerns regarding medicines are outlined above.  Orders Placed This Encounter  Procedures  . EKG 12-Lead   No orders of the defined types were placed in this encounter.   Patient Instructions  Medication Instructions:  The current medical regimen is effective;  continue present plan and medications.  *If you need a refill on your cardiac medications before your next appointment, please call your pharmacy*  Follow-Up: At [facility], you and your health needs are our priority.  As part of our continuing mission to provide you with exceptional heart care, we have created designated Provider Care Teams.  These Care Teams include your primary Cardiologist (physician) and Advanced Practice Providers (APPs -  Physician Assistants and Nurse Practitioners) who all work together to provide you with the care you need, when you need it.  We recommend signing up for the patient portal called "MyChart".  Sign up information is provided on this After Visit Summary.  MyChart is used to connect with patients for Virtual Visits (Telemedicine).  Patients are able to view lab/test results, encounter notes, upcoming appointments, etc.  Non-urgent messages can be sent to your provider as well.   To learn more about what you can do with MyChart, go to [email or web address].    Your next appointment:   6 month(s)  The format for your next appointment:   In Person  Provider:   Candee Furbish, MD  Thank you for choosing [facility]!!        Signed, Candee Furbish, MD  01/27/2020 9:52  AM    [place]

## 2020-01-27 NOTE — Patient Instructions (Signed)

## 2020-01-28 ENCOUNTER — Encounter: Payer: Self-pay | Admitting: Oncology

## 2020-01-31 ENCOUNTER — Telehealth: Payer: Self-pay

## 2020-01-31 NOTE — Telephone Encounter (Signed)
Returned phone call to patient about Covid boaster shot. Patient states that he seen that the Covid boaster shot has been approved and wants to be schedule for the boaster shot. Explained to patient that as now we do not have anything in place to give a boaster shot. Advised patient that once we had further information on the boaster we start to notify patients.

## 2020-02-10 MED FILL — CALQUENCE 100 MG CAPSULE: 100 | 30 days supply | Qty: 30 | Fill #1

## 2020-02-20 ENCOUNTER — Ambulatory Visit (INDEPENDENT_AMBULATORY_CARE_PROVIDER_SITE_OTHER): Payer: Medicare PPO | Admitting: *Deleted

## 2020-02-20 DIAGNOSIS — R55 Syncope and collapse: Secondary | ICD-10-CM | POA: Diagnosis not present

## 2020-02-20 LAB — CUP PACEART REMOTE DEVICE CHECK
Date Time Interrogation Session: 20210902001045
Implantable Pulse Generator Implant Date: 20200430

## 2020-02-21 NOTE — Progress Notes (Signed)
Carelink Summary Report / Loop Recorder 

## 2020-03-08 ENCOUNTER — Emergency Department (HOSPITAL_COMMUNITY): Payer: Medicare PPO

## 2020-03-08 ENCOUNTER — Encounter (HOSPITAL_COMMUNITY): Payer: Self-pay | Admitting: *Deleted

## 2020-03-08 ENCOUNTER — Emergency Department (HOSPITAL_COMMUNITY)
Admission: EM | Admit: 2020-03-08 | Discharge: 2020-03-08 | Disposition: A | Discharge status: Home / Self Care | Payer: Medicare PPO | Attending: Emergency Medicine | Admitting: Emergency Medicine

## 2020-03-08 ENCOUNTER — Other Ambulatory Visit: Payer: Self-pay

## 2020-03-08 DIAGNOSIS — R0602 Shortness of breath: Secondary | ICD-10-CM | POA: Insufficient documentation

## 2020-03-08 DIAGNOSIS — Z79899 Other long term (current) drug therapy: Secondary | ICD-10-CM | POA: Insufficient documentation

## 2020-03-08 DIAGNOSIS — N183 Chronic kidney disease, stage 3 unspecified: Secondary | ICD-10-CM | POA: Diagnosis not present

## 2020-03-08 DIAGNOSIS — R197 Diarrhea, unspecified: Secondary | ICD-10-CM | POA: Diagnosis not present

## 2020-03-08 DIAGNOSIS — I129 Hypertensive chronic kidney disease with stage 1 through stage 4 chronic kidney disease, or unspecified chronic kidney disease: Secondary | ICD-10-CM | POA: Diagnosis not present

## 2020-03-08 DIAGNOSIS — I2510 Atherosclerotic heart disease of native coronary artery without angina pectoris: Secondary | ICD-10-CM | POA: Diagnosis not present

## 2020-03-08 DIAGNOSIS — R112 Nausea with vomiting, unspecified: Secondary | ICD-10-CM | POA: Diagnosis present

## 2020-03-08 DIAGNOSIS — H81399 Other peripheral vertigo, unspecified ear: Secondary | ICD-10-CM | POA: Insufficient documentation

## 2020-03-08 LAB — URINALYSIS, ROUTINE W REFLEX MICROSCOPIC
Bacteria, UA: NONE SEEN
Bilirubin Urine: NEGATIVE
Glucose, UA: NEGATIVE mg/dL
Ketones, ur: NEGATIVE mg/dL
Leukocytes,Ua: NEGATIVE
Nitrite: NEGATIVE
Protein, ur: NEGATIVE mg/dL
Specific Gravity, Urine: 1.02 (ref 1.005–1.030)
pH: 5 (ref 5.0–8.0)

## 2020-03-08 LAB — CBC
HCT: 45.6 % (ref 39.0–52.0)
Hemoglobin: 15 g/dL (ref 13.0–17.0)
MCH: 31.1 pg (ref 26.0–34.0)
MCHC: 32.9 g/dL (ref 30.0–36.0)
MCV: 94.6 fL (ref 80.0–100.0)
Platelets: 133 10*3/uL — ABNORMAL LOW (ref 150–400)
RBC: 4.82 MIL/uL (ref 4.22–5.81)
RDW: 12.4 % (ref 11.5–15.5)
WBC: 17.2 10*3/uL — ABNORMAL HIGH (ref 4.0–10.5)
nRBC: 0 % (ref 0.0–0.2)

## 2020-03-08 LAB — COMPREHENSIVE METABOLIC PANEL
ALT: 14 U/L (ref 0–44)
AST: 19 U/L (ref 15–41)
Albumin: 4.4 g/dL (ref 3.5–5.0)
Alkaline Phosphatase: 66 U/L (ref 38–126)
Anion gap: 17 — ABNORMAL HIGH (ref 5–15)
BUN: 30 mg/dL — ABNORMAL HIGH (ref 8–23)
CO2: 20 mmol/L — ABNORMAL LOW (ref 22–32)
Calcium: 9.5 mg/dL (ref 8.9–10.3)
Chloride: 107 mmol/L (ref 98–111)
Creatinine, Ser: 1.42 mg/dL — ABNORMAL HIGH (ref 0.61–1.24)
GFR calc Af Amer: 54 mL/min — ABNORMAL LOW (ref >60)
GFR calc non Af Amer: 47 mL/min — ABNORMAL LOW (ref >60)
Glucose, Bld: 148 mg/dL — ABNORMAL HIGH (ref 70–99)
Potassium: 3.9 mmol/L (ref 3.5–5.1)
Sodium: 144 mmol/L (ref 135–145)
Total Bilirubin: 0.5 mg/dL (ref 0.3–1.2)
Total Protein: 6.7 g/dL (ref 6.5–8.1)

## 2020-03-08 LAB — LIPASE, BLOOD: Lipase: 27 U/L (ref 11–51)

## 2020-03-08 MED ORDER — LORAZEPAM 2 MG/ML IJ SOLN
0.5000 mg | Freq: Once | INTRAMUSCULAR | Status: AC
  Administered 2020-03-08: 0.5 mg via INTRAVENOUS
  Filled 2020-03-08: qty 1

## 2020-03-08 MED ORDER — IOHEXOL 350 MG/ML SOLN
80.0000 mL | Freq: Once | INTRAVENOUS | Status: AC | PRN
  Administered 2020-03-08: 80 mL via INTRAVENOUS

## 2020-03-08 MED ORDER — LORAZEPAM 1 MG PO TABS: 1.0000 mg | ORAL_TABLET | Freq: Three times a day (TID) | ORAL | 0 refills | Status: DC | PRN

## 2020-03-08 MED ORDER — ALBUTEROL SULFATE (2.5 MG/3ML) 0.083% IN NEBU: 5.0000 mg | INHALATION_SOLUTION | Freq: Once | RESPIRATORY_TRACT | Status: DC

## 2020-03-08 NOTE — ED Notes (Addendum)
Pt ambulated in room and hallway. Pt stated that when first standing he felt "slightly woozy". During ambulation, pt stated he felt 90% better than he did when he first arrived. Pt was steady on his feet and ambulated strongly.

## 2020-03-08 NOTE — Discharge Instructions (Signed)
The labs and CAT scan today look good.  No signs of stroke.  This is most likely an inner ear problem.  Use the medication as needed for the dizziness but if it gets worse and you can't walk or start having vision trouble, terrible headache, weakness on 1 side or trouble speaking return to the ER immediately.

## 2020-03-08 NOTE — ED Triage Notes (Addendum)
Sudden onset of N/V/D with dizziness about 5 pm. Pt hyperventilating in triage. Feels off balance  Diaphoretic and vomiting as well as pale in triage

## 2020-03-08 NOTE — ED Provider Notes (Signed)
[facility] DEPT Provider Note   CSN: [id number] Arrival date & time: 03/08/20  1732     History Chief Complaint  Patient presents with  . Dizziness  . Nausea  . Diarrhea  . Emesis  . Shortness of Breath    MAE CIANCI is a 79 y.o. male.  The history is provided by the patient and the spouse.  Dizziness Quality:  Room spinning and imbalance Severity:  Severe Onset quality:  Sudden Duration:  1 hour Timing:  Constant Progression:  Improving Chronicity:  New Context comment:  Patient reports he was sitting in his chair watching a DVD when he suddenly started feeling dizzy.  He then tried to get up and walk and the dizziness became significantly worse Relieved by:  Being still Worsened by:  Movement, turning head, sitting upright and standing up Ineffective treatments:  None tried Associated symptoms: diarrhea, nausea, shortness of breath and vomiting   Associated symptoms: no palpitations and no syncope   Associated symptoms comment:  Slight headache but no severe headache Risk factors comment:  History of bacteremia, CLL, hypertension, hyperlipidemia, subarachnoid hemorrhage after a fall, prior history of paroxysmal atrial fibrillation but not currently anticoagulated Diarrhea Associated symptoms: vomiting   Emesis Associated symptoms: diarrhea   Shortness of Breath Associated symptoms: vomiting   Associated symptoms: no syncope        Past Medical History:  Diagnosis Date  . Acute encephalopathy 08/17/2016  . Acute renal failure superimposed on stage 3 chronic kidney disease ([place]) 10/15/2018  . AKI (acute kidney injury) ([place]) 08/16/2016  . Arthritis   . Bacteremia due to Enterobacter species 08/19/2016  . Carpal tunnel syndrome, bilateral 10/09/2017  . Chest tightness 10/15/2018  . CLL (chronic lymphocytic leukemia) ([place]) 12/16/2014  . Dyspnea 09/20/2012  . Elevated troponin 10/16/2018  . Enterobacter sepsis ([place]) 08/19/2016  . Fall  10/15/2018  . GERD (gastroesophageal reflux disease)   . Hepatitis 1966   Drug reaction after taking medication  . Hyperglycemia   . Hyperlipemia   . Hypertension   . Hypokalemia 08/16/2016  . Lung nodule seen on imaging study    bilateral lungs  . Nasal bone fracture 10/15/2018  . Right foot pain   . SAH (subarachnoid hemorrhage) ([place]) 10/15/2018  . Syncope 10/15/2018  . Thrombocytopenia ([place]) 10/15/2018  . Urinary tract infection with hematuria     Patient Active Problem List   Diagnosis Date Noted  . Goals of care, counseling/discussion 11/13/2018  . Coronary artery disease involving native coronary artery of native heart without angina pectoris 11/01/2018  . Elevated troponin 10/16/2018  . HLD (hyperlipidemia) 10/15/2018  . Syncope 10/15/2018  . Nasal bone fracture 10/15/2018  . Chest tightness 10/15/2018  . SAH (subarachnoid hemorrhage) ([place]) 10/15/2018  . Acute renal failure superimposed on stage 3 chronic kidney disease ([place]) 10/15/2018  . Thrombocytopenia ([place]) 10/15/2018  . Fall 10/15/2018  . Carpal tunnel syndrome, bilateral 10/09/2017  . Right foot pain   . Enterobacter sepsis ([place]) 08/19/2016  . Bacteremia due to Enterobacter species 08/19/2016  . Acute encephalopathy 08/17/2016  . Hyperglycemia   . Urinary tract infection with hematuria   . Hypertension 08/16/2016  . AKI (acute kidney injury) ([place]) 08/16/2016  . Hypokalemia 08/16/2016  . CLL (chronic lymphocytic leukemia) ([place]) 12/16/2014  . Dyspnea 09/20/2012    Past Surgical History:  Procedure Laterality Date  . APPENDECTOMY    . arthroscoyp  2000   left knee  . COLONOSCOPY W/ POLYPECTOMY    .  LOOP RECORDER INSERTION N/A 10/18/2018   Procedure: LOOP RECORDER INSERTION;  Surgeon: [name], MD;  Location: [facility];  Service: Cardiovascular;  Laterality: N/A;  . LUMBAR LAMINECTOMY/DECOMPRESSION MICRODISCECTOMY  06/27/2012   Procedure: LUMBAR LAMINECTOMY/DECOMPRESSION MICRODISCECTOMY 1  LEVEL;  Surgeon: [name], MD;  Location: [facility];  Service: Neurosurgery;  Laterality: Bilateral;  bilateral four-five laminectony  . SKIN CANCER EXCISION  5-6 years ago   moe-s surgery- basal b  . TONSILLECTOMY         Family History  Problem Relation Age of Onset  . Sudden death Mother 26       possible botulism  . Dementia Father   . Seizures Sister     Social History   Tobacco Use  . Smoking status: Never Smoker  . Smokeless tobacco: Never Used  Vaping Use  . Vaping Use: Never used  Substance Use Topics  . Alcohol use: Yes    Alcohol/week: 2.0 standard drinks    Types: 2 Cans of beer per week    Comment: rare 2 beers per month  . Drug use: No    Home Medications Prior to Admission medications   Medication Sig Start Date End Date Taking? Authorizing Provider  CALQUENCE 100 MG capsule TAKE 1 CAPSULE (100 MG) BY MOUTH ONCE A DAY. 01/06/20   Ladell Pier, MD  Cholecalciferol (VITAMIN D) 2000 units CAPS Take 2,000 Units by mouth daily.     [provider]  EPINEPHrine (EPIPEN) 0.3 mg/0.3 mL DEVI Inject 0.3 mLs (0.3 mg total) into the muscle once. 05/26/12   Dunn, Areta Haber, PA-C  Loratadine 10 MG CAPS Take 10 mg by mouth.    [provider]  metoprolol succinate (TOPROL-XL) 25 MG 24 hr tablet Take 25 mg by mouth daily.    [provider]  simvastatin (ZOCOR) 40 MG tablet Take 20 mg by mouth daily.     [provider]    Allergies    Patient has no known allergies.  Review of Systems   Review of Systems  Respiratory: Positive for shortness of breath.   Cardiovascular: Negative for palpitations and syncope.  Gastrointestinal: Positive for diarrhea, nausea and vomiting.  Neurological: Positive for dizziness.  All other systems reviewed and are negative.   Physical Exam Updated Vital Signs BP (!) 156/69 (BP Location: Left Arm)   Pulse 62   Resp (!) 26   SpO2 97%   Physical Exam Vitals and nursing note reviewed.   Constitutional:      General: He is not in acute distress.    Appearance: He is well-developed and normal weight. He is diaphoretic.  HENT:     Head: Normocephalic and atraumatic.  Eyes:     General: No visual field deficit.    Extraocular Movements: Extraocular movements intact.     Conjunctiva/sclera: Conjunctivae normal.     Pupils: Pupils are equal, round, and reactive to light.  Cardiovascular:     Rate and Rhythm: Normal rate and regular rhythm.     Heart sounds: No murmur heard.   Pulmonary:     Effort: Pulmonary effort is normal. No respiratory distress.     Breath sounds: Normal breath sounds. No wheezing or rales.  Abdominal:     General: There is no distension.     Palpations: Abdomen is soft.     Tenderness: There is no abdominal tenderness. There is no guarding or rebound.  Musculoskeletal:  General: No tenderness. Normal range of motion.     Cervical back: Normal range of motion and neck supple.  Skin:    General: Skin is warm.     Capillary Refill: Capillary refill takes 2 to 3 seconds.     Coloration: Skin is pale.     Findings: No erythema or rash.  Neurological:     Mental Status: He is alert and oriented to person, place, and time.     Cranial Nerves: No dysarthria or facial asymmetry.     Sensory: No sensory deficit.     Motor: No weakness.     Coordination: Finger-Nose-Finger Test and Heel to [employer] normal.     Comments: Nystagmus noted in all directions but most prominent when looking to the right  Psychiatric:        Mood and Affect: Mood normal.        Behavior: Behavior normal.        Thought Content: Thought content normal.     ED Results / Procedures / Treatments   Labs (all labs ordered are listed, but only abnormal results are displayed) Labs Reviewed  COMPREHENSIVE METABOLIC PANEL - Abnormal; Notable for the following components:      Result Value   CO2 20 (*)    Glucose, Bld 148 (*)    BUN 30 (*)    Creatinine, Ser 1.42 (*)     GFR calc non Af Amer 47 (*)    GFR calc Af Amer 54 (*)    Anion gap 17 (*)    All other components within normal limits  CBC - Abnormal; Notable for the following components:   WBC 17.2 (*)    Platelets 133 (*)    All other components within normal limits  URINALYSIS, ROUTINE W REFLEX MICROSCOPIC - Abnormal; Notable for the following components:   Hgb urine dipstick MODERATE (*)    All other components within normal limits  LIPASE, BLOOD    EKG EKG Interpretation  Date/Time:  Sunday March 08 2020 17:58:25 EDT Ventricular Rate:  61 PR Interval:    QRS Duration: 84 QT Interval:  433 QTC Calculation: 437 R Axis:   78 Text Interpretation: Sinus rhythm Left atrial enlargement No significant change since last tracing Confirmed by Blanchie Dessert [phone]) on 03/08/2020 6:06:51 PM   Radiology CT Angio Head W or Wo Contrast  Result Date: 03/08/2020 CLINICAL DATA:  Dizziness and vertigo.  Vomiting. EXAM: CT ANGIOGRAPHY HEAD AND NECK TECHNIQUE: Multidetector CT imaging of the head and neck was performed using the standard protocol during bolus administration of intravenous contrast. Multiplanar CT image reconstructions and MIPs were obtained to evaluate the vascular anatomy. Carotid stenosis measurements (when applicable) are obtained utilizing NASCET criteria, using the distal internal carotid diameter as the denominator. CONTRAST:  106mL OMNIPAQUE IOHEXOL 350 MG/ML SOLN COMPARISON:  None. FINDINGS: CT HEAD FINDINGS Brain: There is no mass, hemorrhage or extra-axial collection. Posterior left convexity meningioma measures 11 mm. The size and configuration of the ventricles and extra-axial CSF spaces are normal. There is no acute or chronic infarction. The brain parenchyma is normal. Skull: The visualized skull base, calvarium and extracranial soft tissues are normal. Sinuses/Orbits: No fluid levels or advanced mucosal thickening of the visualized paranasal sinuses. No mastoid or middle ear  effusion. The orbits are normal. CTA NECK FINDINGS SKELETON: There is no bony spinal canal stenosis. No lytic or blastic lesion. OTHER NECK: Normal pharynx, larynx and major salivary glands. No cervical lymphadenopathy. Unremarkable thyroid  gland. UPPER CHEST: No pneumothorax or pleural effusion. No nodules or masses. AORTIC ARCH: There is mild calcific atherosclerosis of the aortic arch. There is no aneurysm, dissection or hemodynamically significant stenosis of the visualized portion of the aorta. Conventional 3 vessel aortic branching pattern. The visualized proximal subclavian arteries are widely patent. RIGHT CAROTID SYSTEM: Normal without aneurysm, dissection or stenosis. LEFT CAROTID SYSTEM: Normal without aneurysm, dissection or stenosis. VERTEBRAL ARTERIES: Right dominant configuration. Both origins are clearly patent. There is no dissection, occlusion or flow-limiting stenosis to the skull base (V1-V3 segments). CTA HEAD FINDINGS POSTERIOR CIRCULATION: --Vertebral arteries: Normal V4 segments. --Inferior cerebellar arteries: Normal. --Basilar artery: Normal. --Superior cerebellar arteries: Normal. --Posterior cerebral arteries (PCA): Normal. ANTERIOR CIRCULATION: --Intracranial internal carotid arteries: Atherosclerotic calcification of the internal carotid arteries at the skull base without hemodynamically significant stenosis. --Anterior cerebral arteries (ACA): Normal. Hypoplastic left A1 segment, normal variant --Middle cerebral arteries (MCA): Normal. VENOUS SINUSES: As permitted by contrast timing, patent. ANATOMIC VARIANTS: Both P comms are patent. Review of the MIP images confirms the above findings. IMPRESSION: 1. No emergent large vessel occlusion or high-grade stenosis of the head or neck. 2. Posterior left convexity meningioma measures 11 mm. Aortic Atherosclerosis (ICD10-I70.0). Electronically Signed   By: Ulyses Jarred M.D.   On: 03/08/2020 21:43   DG Chest 2 View  Result Date:  03/08/2020 CLINICAL DATA:  Shortness of breath. EXAM: CHEST - 2 VIEW COMPARISON:  Oct 31, 2018 FINDINGS: Minimal atelectasis in the left base. The heart size borderline. The hila and mediastinum are unchanged. No pulmonary nodules or masses. No convincing evidence of pneumonia. IMPRESSION: No active cardiopulmonary disease. Electronically Signed   By: Dorise Bullion III M.D   On: 03/08/2020 18:55   CT Angio Neck W and/or Wo Contrast  Result Date: 03/08/2020 CLINICAL DATA:  Dizziness and vertigo.  Vomiting. EXAM: CT ANGIOGRAPHY HEAD AND NECK TECHNIQUE: Multidetector CT imaging of the head and neck was performed using the standard protocol during bolus administration of intravenous contrast. Multiplanar CT image reconstructions and MIPs were obtained to evaluate the vascular anatomy. Carotid stenosis measurements (when applicable) are obtained utilizing NASCET criteria, using the distal internal carotid diameter as the denominator. CONTRAST:  80mL OMNIPAQUE IOHEXOL 350 MG/ML SOLN COMPARISON:  None. FINDINGS: CT HEAD FINDINGS Brain: There is no mass, hemorrhage or extra-axial collection. Posterior left convexity meningioma measures 11 mm. The size and configuration of the ventricles and extra-axial CSF spaces are normal. There is no acute or chronic infarction. The brain parenchyma is normal. Skull: The visualized skull base, calvarium and extracranial soft tissues are normal. Sinuses/Orbits: No fluid levels or advanced mucosal thickening of the visualized paranasal sinuses. No mastoid or middle ear effusion. The orbits are normal. CTA NECK FINDINGS SKELETON: There is no bony spinal canal stenosis. No lytic or blastic lesion. OTHER NECK: Normal pharynx, larynx and major salivary glands. No cervical lymphadenopathy. Unremarkable thyroid gland. UPPER CHEST: No pneumothorax or pleural effusion. No nodules or masses. AORTIC ARCH: There is mild calcific atherosclerosis of the aortic arch. There is no aneurysm,  dissection or hemodynamically significant stenosis of the visualized portion of the aorta. Conventional 3 vessel aortic branching pattern. The visualized proximal subclavian arteries are widely patent. RIGHT CAROTID SYSTEM: Normal without aneurysm, dissection or stenosis. LEFT CAROTID SYSTEM: Normal without aneurysm, dissection or stenosis. VERTEBRAL ARTERIES: Right dominant configuration. Both origins are clearly patent. There is no dissection, occlusion or flow-limiting stenosis to the skull base (V1-V3 segments). CTA HEAD FINDINGS POSTERIOR CIRCULATION: --Vertebral arteries:  Normal V4 segments. --Inferior cerebellar arteries: Normal. --Basilar artery: Normal. --Superior cerebellar arteries: Normal. --Posterior cerebral arteries (PCA): Normal. ANTERIOR CIRCULATION: --Intracranial internal carotid arteries: Atherosclerotic calcification of the internal carotid arteries at the skull base without hemodynamically significant stenosis. --Anterior cerebral arteries (ACA): Normal. Hypoplastic left A1 segment, normal variant --Middle cerebral arteries (MCA): Normal. VENOUS SINUSES: As permitted by contrast timing, patent. ANATOMIC VARIANTS: Both P comms are patent. Review of the MIP images confirms the above findings. IMPRESSION: 1. No emergent large vessel occlusion or high-grade stenosis of the head or neck. 2. Posterior left convexity meningioma measures 11 mm. Aortic Atherosclerosis (ICD10-I70.0). Electronically Signed   By: Ulyses Jarred M.D.   On: 03/08/2020 21:43    Procedures Procedures (including critical care time)  Medications Ordered in ED Medications  LORazepam (ATIVAN) injection 0.5 mg (has no administration in time range)    ED Course  I have reviewed the triage vital signs and the nursing notes.  Pertinent labs & imaging results that were available during my care of the patient were reviewed by me and considered in my medical decision making (see chart for details).    MDM  Rules/Calculators/A&P                          Pt with sx most consistent with peripheral vertigo with sudden onset of dizziness and difficulty walking and vomiting.  No systemic or infectious sx.  Normal neuro exam without weakness, cerebellar findings on exam but pt is unable to walk due to severe symptoms.  Normal vision.  Sx are reproducible with movement of the head.  No hx of Stroke but multiple risk factors.  He has no chest pain, palpitations, or abd pain at this time.  Nausea and dizziness improved when lying still with eyes closed. Will treat for peripheral vertigo and re-eval.  Imaging and labs pending.  EKG shows sinus rhythm.  7:25 PM Labs at baseline with chronically elevated white count of 17 from CLL.  CMP with Cr of 1.42 which is similar to prior.  Lipase wnl.  CXR wnl.  Waiting on head CT.  On re-eval after .5mg  of ativan pt reports symptoms are signficantly improved and he is now reading a book.  10:36 PM CTA without acute occlusive finding or signs of stroke.  Pt does have meningioma but not related.  Pt was able to ambulate without ataxia and reports 90% better.  Will d/c home with ativan prn and return precautions and pcp f/u. MDM Number of Diagnoses or Management Options   Amount and/or Complexity of Data Reviewed Clinical lab tests: ordered and reviewed Tests in the radiology section of CPT: ordered and reviewed Tests in the medicine section of CPT: ordered and reviewed Decide to obtain previous medical records or to obtain history from someone other than the patient: yes Obtain history from someone other than the patient: no Review and summarize past medical records: yes Independent visualization of images, tracings, or specimens: yes  Risk of Complications, Morbidity, and/or Mortality Presenting problems: moderate Diagnostic procedures: low Management options: low  Patient Progress Patient progress: improved     Final Clinical Impression(s) / ED  Diagnoses Final diagnoses:  Peripheral vertigo, unspecified laterality    Rx / DC Orders ED Discharge Orders         Ordered    LORazepam (ATIVAN) 1 MG tablet  Every 8 hours PRN        03/08/20 2233  Blanchie Dessert, MD 03/08/20 2238

## 2020-03-09 ENCOUNTER — Telehealth: Payer: Self-pay

## 2020-03-09 ENCOUNTER — Other Ambulatory Visit: Payer: Self-pay | Admitting: Oncology

## 2020-03-09 ENCOUNTER — Telehealth: Payer: Self-pay | Admitting: *Deleted

## 2020-03-09 DIAGNOSIS — C9110 Chronic lymphocytic leukemia of B-cell type not having achieved remission: Secondary | ICD-10-CM

## 2020-03-09 NOTE — Telephone Encounter (Signed)
Called to report that he had "terrible" positional vertigo on Sunday evening. Also his most recent platelet count was 133,000 and was pleased with this. Just wanted MD aware. [name] notified.

## 2020-03-09 NOTE — Telephone Encounter (Signed)
Pt transmission is sent. 03-09-2020. I told him the nurse will review it and give him a call back.

## 2020-03-09 NOTE — Telephone Encounter (Signed)
Patient called in stating that he had some vertigo episodes yesterday 03/08/2020. Patient is concerned and is trying to send a full transmission. Patient monitor has a black ! On the monitor and he is going to call tech support and call us back and let us know

## 2020-03-09 NOTE — Telephone Encounter (Signed)
Transmission from loop recorder reviewed. Symptom episode showed SR. Patient sent transmission due to episode of dizziness that was diagnosed in ED 03/08/20 as vertigo. He wanted to confirm that dizziness was not related to cardiac rhythm recorded by loop recorder. Reassured that no episodes or events recorded that coincides with his symptoms. Patient asymptomatic after treatment in ED and will F/U with PCP.

## 2020-03-10 ENCOUNTER — Telehealth: Payer: Self-pay

## 2020-03-10 NOTE — Telephone Encounter (Signed)
ILR symptom alert received 03/09/20. Patient states he was told to press the button, to assure it is working. Advised patient it is working, call back if he has any further questions or concerns. Verbalies understanding.

## 2020-03-12 ENCOUNTER — Telehealth: Payer: Self-pay | Admitting: Oncology

## 2020-03-12 NOTE — Telephone Encounter (Signed)
Rescheduled appointments per provider PAL Called patient who is aware of updated appointment.

## 2020-03-16 MED FILL — CALQUENCE 100 MG CAPSULE: 100 | 30 days supply | Qty: 30 | Fill #0

## 2020-03-17 ENCOUNTER — Other Ambulatory Visit: Payer: Self-pay | Admitting: Cardiology

## 2020-03-24 ENCOUNTER — Ambulatory Visit (INDEPENDENT_AMBULATORY_CARE_PROVIDER_SITE_OTHER): Payer: Medicare PPO

## 2020-03-24 DIAGNOSIS — R55 Syncope and collapse: Secondary | ICD-10-CM | POA: Diagnosis not present

## 2020-03-24 LAB — CUP PACEART REMOTE DEVICE CHECK
Date Time Interrogation Session: 20211005001820
Implantable Pulse Generator Implant Date: 20200430

## 2020-03-27 NOTE — Progress Notes (Signed)
Carelink Summary Report / Loop Recorder 

## 2020-04-09 ENCOUNTER — Ambulatory Visit: Payer: Medicare PPO | Admitting: Oncology

## 2020-04-09 ENCOUNTER — Other Ambulatory Visit: Payer: Medicare PPO

## 2020-04-13 ENCOUNTER — Inpatient Hospital Stay: Payer: Medicare PPO | Admitting: Oncology

## 2020-04-13 ENCOUNTER — Other Ambulatory Visit: Payer: Self-pay

## 2020-04-13 ENCOUNTER — Inpatient Hospital Stay: Payer: Medicare PPO | Attending: Oncology

## 2020-04-13 ENCOUNTER — Telehealth: Payer: Self-pay | Admitting: Oncology

## 2020-04-13 VITALS — BP 126/67 | HR 68 | Temp 97.6°F | Resp 16 | Ht 63.0 in | Wt 162.5 lb

## 2020-04-13 DIAGNOSIS — C9110 Chronic lymphocytic leukemia of B-cell type not having achieved remission: Secondary | ICD-10-CM

## 2020-04-13 DIAGNOSIS — I480 Paroxysmal atrial fibrillation: Secondary | ICD-10-CM | POA: Insufficient documentation

## 2020-04-13 DIAGNOSIS — D696 Thrombocytopenia, unspecified: Secondary | ICD-10-CM | POA: Insufficient documentation

## 2020-04-13 LAB — CBC WITH DIFFERENTIAL (CANCER CENTER ONLY)
Abs Immature Granulocytes: 0.03 10*3/uL (ref 0.00–0.07)
Basophils Absolute: 0.1 10*3/uL (ref 0.0–0.1)
Basophils Relative: 1 %
Eosinophils Absolute: 0.4 10*3/uL (ref 0.0–0.5)
Eosinophils Relative: 3 %
HCT: 47.1 % (ref 39.0–52.0)
Hemoglobin: 15.5 g/dL (ref 13.0–17.0)
Immature Granulocytes: 0 %
Lymphocytes Relative: 35 %
Lymphs Abs: 4.7 10*3/uL — ABNORMAL HIGH (ref 0.7–4.0)
MCH: 30.7 pg (ref 26.0–34.0)
MCHC: 32.9 g/dL (ref 30.0–36.0)
MCV: 93.3 fL (ref 80.0–100.0)
Monocytes Absolute: 0.9 10*3/uL (ref 0.1–1.0)
Monocytes Relative: 7 %
Neutro Abs: 7.2 10*3/uL (ref 1.7–7.7)
Neutrophils Relative %: 54 %
Platelet Count: 156 10*3/uL (ref 150–400)
RBC: 5.05 MIL/uL (ref 4.22–5.81)
RDW: 12.3 % (ref 11.5–15.5)
WBC Count: 13.4 10*3/uL — ABNORMAL HIGH (ref 4.0–10.5)
nRBC: 0 % (ref 0.0–0.2)

## 2020-04-13 LAB — CMP (CANCER CENTER ONLY)
ALT: 13 U/L (ref 0–44)
AST: 14 U/L — ABNORMAL LOW (ref 15–41)
Albumin: 4.2 g/dL (ref 3.5–5.0)
Alkaline Phosphatase: 82 U/L (ref 38–126)
Anion gap: 7 (ref 5–15)
BUN: 26 mg/dL — ABNORMAL HIGH (ref 8–23)
CO2: 26 mmol/L (ref 22–32)
Calcium: 9.3 mg/dL (ref 8.9–10.3)
Chloride: 107 mmol/L (ref 98–111)
Creatinine: 1.45 mg/dL — ABNORMAL HIGH (ref 0.61–1.24)
GFR, Estimated: 49 mL/min — ABNORMAL LOW (ref >60)
Glucose, Bld: 87 mg/dL (ref 70–99)
Potassium: 4.1 mmol/L (ref 3.5–5.1)
Sodium: 140 mmol/L (ref 135–145)
Total Bilirubin: 1 mg/dL (ref 0.3–1.2)
Total Protein: 6.4 g/dL — ABNORMAL LOW (ref 6.5–8.1)

## 2020-04-13 MED FILL — CALQUENCE 100 MG CAPSULE: 100 | 30 days supply | Qty: 30 | Fill #1

## 2020-04-13 NOTE — Progress Notes (Signed)
  [facility] OFFICE PROGRESS NOTE   Diagnosis: CLL  INTERVAL HISTORY:   [name] returns as scheduled.  He continues acalabrutinib.  He feels well.  No fever or night sweats.  No bleeding.  He has received the COVID-19 booster and influenza vaccines.  He walks daily.  Objective:  Vital signs in last 24 hours:  Blood pressure 126/67, pulse 68, temperature 97.6 F (36.4 C), temperature source Tympanic, resp. rate 16, height _0  (1.6 m), weight 162 lb 8 oz (73.7 kg), SpO2 100 %.    Lymphatics: No cervical, supraclavicular, axillary, or inguinal nodes Resp: Lungs clear bilaterally Cardio: Regular rate and rhythm GI: No hepatosplenomegaly Vascular: No leg edema     Lab Results:  Lab Results  Component Value Date   WBC 13.4 (H) 04/13/2020   HGB 15.5 04/13/2020   HCT 47.1 04/13/2020   MCV 93.3 04/13/2020   PLT 156 04/13/2020   NEUTROABS 7.2 04/13/2020    CMP  Lab Results  Component Value Date   NA 140 04/13/2020   K 4.1 04/13/2020   CL 107 04/13/2020   CO2 26 04/13/2020   GLUCOSE 87 04/13/2020   BUN 26 (H) 04/13/2020   CREATININE 1.45 (H) 04/13/2020   CALCIUM 9.3 04/13/2020   PROT 6.4 (L) 04/13/2020   ALBUMIN 4.2 04/13/2020   AST 14 (L) 04/13/2020   ALT 13 04/13/2020   ALKPHOS 82 04/13/2020   BILITOT 1.0 04/13/2020   GFRNONAA 49 (L) 04/13/2020   GFRAA 54 (L) 03/08/2020     Medications: I have reviewed the patient's current medications.   Assessment/Plan: 1. CLL ? Review of the peripheral blood smear is consistent with chronic lymphocytic leukemia ? 12/16/2014 Peripheral blood flow cytometry consistent with CLL ? 10/16/2018 MRI brain and cervical spine - Prominent bulky cervical adenopathy within the visualized neck,compatible with history of CLL. ? FISH analysis- gain of 13q; 17p and 11q not detected ? Acalabrutinib 11/22/2018 2.thrombocytopenia-likely secondary to chronic lymphocytic leukemia versus "pseudo thrombocytopenia"due to  platelet clumping; progressive thrombocytopenia June 2018  Trial of prednisone started 10/17/2018 after admission with a fall/subarachnoid hemorrhage-starting dose 60 mg daily  Prednisone tapered to 40 mg daily beginning 10/22/2018  Prednisone taper to 30 mg daily beginning 10/29/2018, tapered to off over 6 days beginning 11/13/2018  Bone marrow biopsy 11/09/2018-hypercellular marrow with prominent involvement by CLL, abundant megakaryocytes, lymphocytes represent 82% of all cells, the cytogenetics returned normal 3.Hospitalization with sepsis/UTI with bacteremia (Enterobacter aerogenes)March 2018 4. Hospitalization for syncopal episode resulting in fall. Developed a small right subarachnoid hemorrhage.  5. MRI brain 10/16/2018 - Focal 1-2 cm osseous lesions involving the right parietal and occipital calvarium as above, indeterminate. Findings are of uncertain significance, with no definite corresponding osseous lesion seen on prior CT. 6.  History of idiopathic "anaphylactoid "reactions 7.  Paroxysmal atrial fibrillation atrial fibrillation noted on a cardiac monitor 2020, not placed on anticoagulation secondary to thrombocytopenia      Disposition: Mr. Afzal appears stable.  He is tolerating the acalabrutinib well.  The platelet count is now in the normal range.  The lymphocyte count is lower.  He will continue acalabrutinib.  Mr. Berthold will return for a lab visit in 3 months and an office visit in 6 months.  Betsy Coder, MD  04/13/2020  9:14 AM

## 2020-04-13 NOTE — Telephone Encounter (Signed)
Scheduled per los. Gave avs and calendar  

## 2020-04-26 LAB — CUP PACEART REMOTE DEVICE CHECK
Date Time Interrogation Session: 20211107005103
Implantable Pulse Generator Implant Date: 20200430

## 2020-04-27 ENCOUNTER — Ambulatory Visit (INDEPENDENT_AMBULATORY_CARE_PROVIDER_SITE_OTHER): Payer: Medicare PPO

## 2020-04-27 DIAGNOSIS — R55 Syncope and collapse: Secondary | ICD-10-CM

## 2020-04-27 NOTE — Progress Notes (Signed)
Carelink Summary Report / Loop Recorder 

## 2020-05-05 ENCOUNTER — Other Ambulatory Visit (HOSPITAL_COMMUNITY): Payer: Self-pay | Admitting: Oncology

## 2020-05-05 ENCOUNTER — Other Ambulatory Visit: Payer: Self-pay | Admitting: Oncology

## 2020-05-05 DIAGNOSIS — C9110 Chronic lymphocytic leukemia of B-cell type not having achieved remission: Secondary | ICD-10-CM

## 2020-05-11 MED FILL — CALQUENCE 100 MG CAPSULE: 100 | 30 days supply | Qty: 30 | Fill #0

## 2020-05-29 ENCOUNTER — Telehealth: Payer: Self-pay | Admitting: *Deleted

## 2020-05-29 NOTE — Telephone Encounter (Signed)
Patient left message for [name] to inform him he has learned that [employer] now has a new infusion for the immunocompromised that is better than monoclonal antibody or regeneron. They have released 700,000 doses to the [place] and he is contacting Dr. Inda Merlin to get put on the front of the line to receive.

## 2020-06-01 ENCOUNTER — Ambulatory Visit (INDEPENDENT_AMBULATORY_CARE_PROVIDER_SITE_OTHER): Payer: Medicare PPO

## 2020-06-01 DIAGNOSIS — R55 Syncope and collapse: Secondary | ICD-10-CM

## 2020-06-01 LAB — CUP PACEART REMOTE DEVICE CHECK
Date Time Interrogation Session: 20211210014506
Implantable Pulse Generator Implant Date: 20200430

## 2020-06-15 MED FILL — CALQUENCE 100 MG CAPSULE: 100 | 30 days supply | Qty: 30 | Fill #1

## 2020-06-15 NOTE — Progress Notes (Signed)
Carelink Summary Report / Loop Recorder 

## 2020-06-23 ENCOUNTER — Other Ambulatory Visit: Payer: Self-pay | Admitting: Cardiology

## 2020-07-06 ENCOUNTER — Ambulatory Visit (INDEPENDENT_AMBULATORY_CARE_PROVIDER_SITE_OTHER): Payer: Medicare PPO

## 2020-07-06 DIAGNOSIS — R55 Syncope and collapse: Secondary | ICD-10-CM

## 2020-07-06 LAB — CUP PACEART REMOTE DEVICE CHECK
Date Time Interrogation Session: 20220112030137
Implantable Pulse Generator Implant Date: 20200430

## 2020-07-14 ENCOUNTER — Other Ambulatory Visit: Payer: Self-pay

## 2020-07-14 ENCOUNTER — Telehealth: Payer: Self-pay

## 2020-07-14 ENCOUNTER — Inpatient Hospital Stay: Payer: Medicare PPO | Attending: Internal Medicine

## 2020-07-14 DIAGNOSIS — I4891 Unspecified atrial fibrillation: Secondary | ICD-10-CM | POA: Insufficient documentation

## 2020-07-14 DIAGNOSIS — C9110 Chronic lymphocytic leukemia of B-cell type not having achieved remission: Secondary | ICD-10-CM | POA: Insufficient documentation

## 2020-07-14 DIAGNOSIS — D696 Thrombocytopenia, unspecified: Secondary | ICD-10-CM | POA: Insufficient documentation

## 2020-07-14 LAB — CBC WITH DIFFERENTIAL (CANCER CENTER ONLY)
Abs Immature Granulocytes: 0.03 10*3/uL (ref 0.00–0.07)
Basophils Absolute: 0.1 10*3/uL (ref 0.0–0.1)
Basophils Relative: 1 %
Eosinophils Absolute: 0.3 10*3/uL (ref 0.0–0.5)
Eosinophils Relative: 2 %
HCT: 46.4 % (ref 39.0–52.0)
Hemoglobin: 15.2 g/dL (ref 13.0–17.0)
Immature Granulocytes: 0 %
Lymphocytes Relative: 33 %
Lymphs Abs: 3.8 10*3/uL (ref 0.7–4.0)
MCH: 30.6 pg (ref 26.0–34.0)
MCHC: 32.8 g/dL (ref 30.0–36.0)
MCV: 93.5 fL (ref 80.0–100.0)
Monocytes Absolute: 0.8 10*3/uL (ref 0.1–1.0)
Monocytes Relative: 7 %
Neutro Abs: 6.4 10*3/uL (ref 1.7–7.7)
Neutrophils Relative %: 57 %
Platelet Count: 129 10*3/uL — ABNORMAL LOW (ref 150–400)
RBC: 4.96 MIL/uL (ref 4.22–5.81)
RDW: 12.4 % (ref 11.5–15.5)
WBC Count: 11.4 10*3/uL — ABNORMAL HIGH (ref 4.0–10.5)
nRBC: 0 % (ref 0.0–0.2)

## 2020-07-14 NOTE — Telephone Encounter (Signed)
Returned call to pt about slight drop in plts. Pt assured [name] would be made aware and if he had any concerns we will call him back. Pt verified understanding. Pt requested after [name] reviews labs.

## 2020-07-15 MED FILL — CALQUENCE 100 MG CAPSULE: 100 | 30 days supply | Qty: 30 | Fill #2

## 2020-07-17 ENCOUNTER — Telehealth: Payer: Self-pay

## 2020-07-17 NOTE — Telephone Encounter (Signed)
Returned call to pt regarding him making sure [name] saw his lab results. Reassured pt [name] would be made aware.

## 2020-07-18 NOTE — Progress Notes (Signed)
Carelink Summary Report / Loop Recorder 

## 2020-07-20 ENCOUNTER — Telehealth: Payer: Self-pay | Admitting: *Deleted

## 2020-07-20 NOTE — Telephone Encounter (Signed)
Informed patient that [name] is not concerned that his platelet count went down to 129. The count can go up and down. No need to check labs prior to his April lab/OV. [name] asking if he "gets" covid, would he qualify for the IV antibody treatment? Informed him that yes, he would and if the happens to call the office and the infusion clinic will be contacted and then they will reach out to him for interview and then schedule it.

## 2020-08-03 ENCOUNTER — Telehealth: Payer: Self-pay | Admitting: *Deleted

## 2020-08-03 ENCOUNTER — Encounter: Payer: Self-pay | Admitting: Cardiology

## 2020-08-03 ENCOUNTER — Ambulatory Visit: Payer: Medicare PPO | Admitting: Cardiology

## 2020-08-03 ENCOUNTER — Other Ambulatory Visit: Payer: Self-pay | Admitting: Nurse Practitioner

## 2020-08-03 ENCOUNTER — Other Ambulatory Visit: Payer: Self-pay

## 2020-08-03 VITALS — BP 120/60 | HR 53 | Ht 63.0 in | Wt 165.0 lb

## 2020-08-03 DIAGNOSIS — I480 Paroxysmal atrial fibrillation: Secondary | ICD-10-CM

## 2020-08-03 DIAGNOSIS — C9110 Chronic lymphocytic leukemia of B-cell type not having achieved remission: Secondary | ICD-10-CM | POA: Diagnosis not present

## 2020-08-03 DIAGNOSIS — I2510 Atherosclerotic heart disease of native coronary artery without angina pectoris: Secondary | ICD-10-CM | POA: Diagnosis not present

## 2020-08-03 NOTE — Telephone Encounter (Addendum)
Patient called to ask if/when he could receive the Evusheld injections for COVID prevention in immuncompromised patients. He is anxious to receive this treatment as soon as possible. Referral placed and called patient back to inform him and should hear something in 48 hours or so. He is now asking if he should have the [place]ld now or have the 4th COVID vaccine first, since he qualifies for this as well. Will ask provider and call him back. Informed him that [name] prefers he have the Evusheld over a 4th vaccine. Patient understands and agrees.

## 2020-08-03 NOTE — Patient Instructions (Signed)

## 2020-08-03 NOTE — Progress Notes (Signed)
Cardiology Office Note:    Date:  08/03/2020   ID:  [name], DOB 06/05/1941, MRN [id number]  PCP:  [name], MD   [place]  Cardiologist:  Candee Furbish, MD  Advanced Practice Provider:  No care team member to display Electrophysiologist:  Will [name] Leeds, MD       Referring MD: [name], MD     History of Present Illness:    [name] is a 80 y.o. male here for the follow-up of paroxysmal atrial fibrillation coronary artery disease CLL.  Previously had a small right subarachnoid hemorrhage after syncopal episode resulting in fall.  Has not been on anticoagulation.  Thankfully has not demonstrated any further episodes of paroxysmal atrial fibrillation.  He has a loop recorder in place monitoring, no atrial fibrillation noted.  Previously was enjoying yoga once a week.  He has had some concerns that he felt mildly winded when walking.  Felt some mild pressure when walking.  Overall been doing very well without any worsening symptoms.  Walks about 2-1/2 miles a day.  His CLL has been stable.  Immunocompromised.  [name] is his wife.  English major  His sisters husband is having a stent placed in [place].    Past Medical History:  Diagnosis Date  . Acute encephalopathy 08/17/2016  . Acute renal failure superimposed on stage 3 chronic kidney disease ([place]) 10/15/2018  . AKI (acute kidney injury) ([place]) 08/16/2016  . Arthritis   . Bacteremia due to Enterobacter species 08/19/2016  . Carpal tunnel syndrome, bilateral 10/09/2017  . Chest tightness 10/15/2018  . CLL (chronic lymphocytic leukemia) ([place]) 12/16/2014  . Dyspnea 09/20/2012  . Elevated troponin 10/16/2018  . Enterobacter sepsis ([place]) 08/19/2016  . Fall 10/15/2018  . GERD (gastroesophageal reflux disease)   . Hepatitis 1966   Drug reaction after taking medication  . Hyperglycemia   . Hyperlipemia   . Hypertension   . Hypokalemia 08/16/2016  . Lung nodule seen on  imaging study    bilateral lungs  . Nasal bone fracture 10/15/2018  . Right foot pain   . SAH (subarachnoid hemorrhage) ([place]) 10/15/2018  . Syncope 10/15/2018  . Thrombocytopenia ([place]) 10/15/2018  . Urinary tract infection with hematuria     Past Surgical History:  Procedure Laterality Date  . APPENDECTOMY    . arthroscoyp  2000   left knee  . COLONOSCOPY W/ POLYPECTOMY    . LOOP RECORDER INSERTION N/A 10/18/2018   Procedure: LOOP RECORDER INSERTION;  Surgeon: [name], MD;  Location: [facility];  Service: Cardiovascular;  Laterality: N/A;  . LUMBAR LAMINECTOMY/DECOMPRESSION MICRODISCECTOMY  06/27/2012   Procedure: LUMBAR LAMINECTOMY/DECOMPRESSION MICRODISCECTOMY 1 LEVEL;  Surgeon: [name], MD;  Location: [facility];  Service: Neurosurgery;  Laterality: Bilateral;  bilateral four-five laminectony  . SKIN CANCER EXCISION  5-6 years ago   moe-s surgery- basal b  . TONSILLECTOMY      Current Medications: Current Meds  Medication Sig  . acetaminophen (TYLENOL) 325 MG tablet Take 325 mg by mouth daily as needed for headache (pain).  . CALQUENCE 100 MG capsule TAKE 1 CAPSULE (100 MG) BY MOUTH ONCE A DAY.  . cholecalciferol (VITAMIN D3) 25 MCG (1000 UNIT) tablet Take 1,000 Units by mouth daily.   Marland Kitchen EPINEPHrine (EPIPEN) 0.3 mg/0.3 mL DEVI Inject 0.3 mLs (0.3 mg total) into the muscle once.  . Loratadine 10 MG CAPS Take 10 mg by mouth daily.   . metoprolol  succinate (TOPROL-XL) 25 MG 24 hr tablet Take 1 tablet (25 mg total) by mouth daily.  . simvastatin (ZOCOR) 40 MG tablet Take 20 mg by mouth daily.      Allergies:   Patient has no known allergies.   Social History   Socioeconomic History  . Marital status: Married    Spouse name: [name]   . Number of children: 2  . Years of education: PhD  . Highest education level: Not on file  Occupational History  . Occupation: Retired  Tobacco Use  . Smoking status: Never Smoker  . Smokeless tobacco: Never Used   Vaping Use  . Vaping Use: Never used  Substance and Sexual Activity  . Alcohol use: Yes    Alcohol/week: 2.0 standard drinks    Types: 2 Cans of beer per week    Comment: rare 2 beers per month  . Drug use: No  . Sexual activity: Not on file  Other Topics Concern  . Not on file  Social History Narrative   Lives with wife   Caffeine use: 1 cup coffee per day   Diet coke      Right handed    Social Determinants of Health   Financial Resource Strain: Not on file  Food Insecurity: Not on file  Transportation Needs: Not on file  Physical Activity: Not on file  Stress: Not on file  Social Connections: Not on file     Family History: The patient's family history includes Dementia in his father; Seizures in his sister; Sudden death (age of onset: 55) in his mother.  ROS:   Please see the history of present illness.     All other systems reviewed and are negative.  EKGs/Labs/Other Studies Reviewed:    The following studies were reviewed today:   10/17/18  CT scan of coronary arteries-distal RCA occluded LAD nonsignificant, 0.84 FFR   IMPRESSION: 1. Coronary artery calcium score 63 Agatston units. This places the patient in the 23rd percentile for age and gender, suggesting low risk for future cardiac events.  2. 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 The PDA and PLV fill with contrast, suggesting collaterals.  3. Mild stenosis proximal LAD.  4. Artifact obscures a portion of OM1 but doubt significant disease.  Echo 2020-EF greater than 65% 25 mm dynamic LV gradient.  Implantable loop recorder-normal sinus rhythm  EKG: prior EKG showed sinus bradycardia 54 with no other abnormalities  Recent Labs: 04/13/2020: ALT 13; BUN 26; Creatinine 1.45; Potassium 4.1; Sodium 140 07/14/2020: Hemoglobin 15.2; Platelet Count 129  Recent Lipid Panel    Component Value Date/Time   CHOL 106 10/16/2018 0339   TRIG 125 10/16/2018 0339   HDL 20 (L) 10/16/2018 0339    CHOLHDL 5.3 10/16/2018 0339   VLDL 25 10/16/2018 0339   LDLCALC 61 10/16/2018 0339     Risk Assessment/Calculations:      Physical Exam:    VS:  BP 120/60 (BP Location: Left Arm, Patient Position: Sitting, Cuff Size: Normal)   Pulse (!) 53   Ht 5\' 3"  (1.6 m)   Wt 165 lb (74.8 kg)   SpO2 98%   BMI 29.23 kg/m     Wt Readings from Last 3 Encounters:  08/03/20 165 lb (74.8 kg)  04/13/20 162 lb 8 oz (73.7 kg)  01/27/20 165 lb (74.8 kg)     GEN:  Well nourished, well developed in no acute distress HEENT: Normal NECK: No JVD; No carotid bruits LYMPHATICS: No lymphadenopathy CARDIAC: RRR,  no murmurs, rubs, gallops RESPIRATORY:  Clear to auscultation without rales, wheezing or rhonchi  ABDOMEN: Soft, non-tender, non-distended MUSCULOSKELETAL:  No edema; No deformity  SKIN: Warm and dry NEUROLOGIC:  Alert and oriented x 3 PSYCHIATRIC:  Normal affect   ASSESSMENT:    1. Paroxysmal atrial fibrillation (HCC)   2. CLL (chronic lymphocytic leukemia) ([place])   3. Coronary artery disease involving native coronary artery of native heart without angina pectoris    PLAN:    In order of problems listed above:  Paroxysmal atrial fibrillation -Implantable loop recorder data showed no further evidence of A. fib.  Excellent. -He is not on anticoagulation because of prior subarachnoid hemorrhage after fall.  CLL -[name] with oncology has been monitoring closely.  Stable.  Platelets 129,000.  Indolent course.  Coronary artery disease/angina -Well-controlled.  Coronary CT once again reviewed.  Low-dose Toprol.  He is not on aspirin currently because of prior subarachnoid hemorrhage.  Chronic kidney disease stage IIIa -Avoiding NSAIDs.  Monitor closely by Dr. Inda Merlin.  Hyperlipidemia -Simvastatin 40 mg.  Doing well.  No myalgias.  Last LDL 73 but prior to that 51.  ALT 13.  12-month follow-up   Medication Adjustments/Labs and Tests Ordered: Current medicines are reviewed at  length with the patient today.  Concerns regarding medicines are outlined above.  No orders of the defined types were placed in this encounter.  No orders of the defined types were placed in this encounter.   Patient Instructions  Medication Instructions:  The current medical regimen is effective;  continue present plan and medications.  *If you need a refill on your cardiac medications before your next appointment, please call your pharmacy*  Follow-Up: At [facility], you and your health needs are our priority.  As part of our continuing mission to provide you with exceptional heart care, we have created designated Provider Care Teams.  These Care Teams include your primary Cardiologist (physician) and Advanced Practice Providers (APPs -  Physician Assistants and Nurse Practitioners) who all work together to provide you with the care you need, when you need it.  We recommend signing up for the patient portal called "MyChart".  Sign up information is provided on this After Visit Summary.  MyChart is used to connect with patients for Virtual Visits (Telemedicine).  Patients are able to view lab/test results, encounter notes, upcoming appointments, etc.  Non-urgent messages can be sent to your provider as well.   To learn more about what you can do with MyChart, go to [email or web address].    Your next appointment:   6 month(s)  The format for your next appointment:   In Person  Provider:   Candee Furbish, MD   Thank you for choosing [facility]!!         Signed, Candee Furbish, MD  08/03/2020 10:11 AM    [place]

## 2020-08-05 ENCOUNTER — Other Ambulatory Visit (HOSPITAL_COMMUNITY): Payer: Self-pay | Admitting: Adult Health

## 2020-08-05 ENCOUNTER — Telehealth: Payer: Self-pay | Admitting: Adult Health

## 2020-08-05 ENCOUNTER — Encounter: Payer: Self-pay | Admitting: Adult Health

## 2020-08-05 DIAGNOSIS — C9110 Chronic lymphocytic leukemia of B-cell type not having achieved remission: Secondary | ICD-10-CM

## 2020-08-05 LAB — CUP PACEART REMOTE DEVICE CHECK
Date Time Interrogation Session: 20220214033202
Implantable Pulse Generator Implant Date: 20200430

## 2020-08-05 NOTE — Telephone Encounter (Signed)
Received referral for patient to be evaluated for Evusheld injection.  LMOM, my chart message sent.    Wilber Bihari, NP

## 2020-08-05 NOTE — Progress Notes (Signed)
I connected by phone with [name] on 08/05/2020, 5:29 PM to discuss the potential use of a new treatment, tixagevimab/cilgavimab, for pre-exposure prophylaxis for prevention of coronavirus disease 2019 (COVID-19) caused by the SARS-CoV-2 virus.  This patient is a 80 y.o. male that meets the FDA criteria for Emergency Use Authorization of tixagevimab/cilgavimab for pre-exposure prophylaxis of COVID-19 disease. Pt meets following criteria:  Age >12 yr and weight > 40kg  Not currently infected with SARS-CoV-2 and has no known recent exposure to an individual infected with SARS-CoV-2 AND o Who has moderate to severe immune compromise due to a medical condition or receipt of immunosuppressive medications or treatments and may not mount an adequate immune response to COVID-19 vaccination or  o Vaccination with any available COVID-19 vaccine, according to the approved or authorized schedule, is not recommended due to a history of severe adverse reaction (e.g., severe allergic reaction) to a COVID-19 vaccine(s) and/or COVID-19 vaccine component(s).  o Patient meets the following definition of mod-severe immune compromised status: 6. Other actively treated hematologic malignancies or severe congenital immunodeficiency syndromes  I have spoken and communicated the following to the patient or parent/caregiver regarding COVID monoclonal antibody treatment:  1. FDA has authorized the emergency use of tixagevimab/cilgavimab for the pre-exposure prophylaxis of COVID-19 in patients with moderate-severe immunocompromised status, who meet above EUA criteria.  2. The significant known and potential risks and benefits of COVID monoclonal antibody, and the extent to which such potential risks and benefits are unknown.  3. Information on available alternative treatments and the risks and benefits of those alternatives, including clinical trials.  4. The patient or parent/caregiver has the option to accept or  refuse COVID monoclonal antibody treatment.  After reviewing this information with the patient, agree to receive tixagevimab/cilgavimab  Scot Dock, NP, 08/05/2020, 5:29 PM

## 2020-08-06 ENCOUNTER — Inpatient Hospital Stay: Payer: Medicare PPO | Attending: Internal Medicine

## 2020-08-06 ENCOUNTER — Other Ambulatory Visit: Payer: Self-pay

## 2020-08-06 DIAGNOSIS — C9110 Chronic lymphocytic leukemia of B-cell type not having achieved remission: Secondary | ICD-10-CM | POA: Diagnosis not present

## 2020-08-06 DIAGNOSIS — Z9225 Personal history of immunosupression therapy: Secondary | ICD-10-CM | POA: Diagnosis not present

## 2020-08-06 DIAGNOSIS — Z298 Encounter for other specified prophylactic measures: Secondary | ICD-10-CM | POA: Diagnosis not present

## 2020-08-06 MED ORDER — TIXAGEVIMAB (PART OF EVUSHELD) INJECTION
150.0000 mg | Freq: Once | INTRAMUSCULAR | Status: AC
  Administered 2020-08-06: 150 mg via INTRAMUSCULAR
  Filled 2020-08-06: qty 1.5

## 2020-08-06 MED ORDER — CILGAVIMAB (PART OF EVUSHELD) INJECTION
150.0000 mg | Freq: Once | INTRAMUSCULAR | Status: AC
  Administered 2020-08-06: 150 mg via INTRAMUSCULAR
  Filled 2020-08-06: qty 1.5

## 2020-08-06 NOTE — Patient Instructions (Signed)
[facility] Discharge Instructions for Patients Receiving Chemotherapy  Today you received the following chemotherapy agents: Evuseld  To help prevent nausea and vomiting after your treatment, we encourage you to take your nausea medication as directed.   If you develop nausea and vomiting that is not controlled by your nausea medication, call the clinic.   BELOW ARE SYMPTOMS THAT SHOULD BE REPORTED IMMEDIATELY:  *FEVER GREATER THAN 100.5 F  *CHILLS WITH OR WITHOUT FEVER  NAUSEA AND VOMITING THAT IS NOT CONTROLLED WITH YOUR NAUSEA MEDICATION  *UNUSUAL SHORTNESS OF BREATH  *UNUSUAL BRUISING OR BLEEDING  TENDERNESS IN MOUTH AND THROAT WITH OR WITHOUT PRESENCE OF ULCERS  *URINARY PROBLEMS  *BOWEL PROBLEMS  UNUSUAL RASH Items with * indicate a potential emergency and should be followed up as soon as possible.  Feel free to call the clinic should you have any questions or concerns. The clinic phone number is [phone].  Please show the [facility] at check-in to the Emergency Department and triage nurse.

## 2020-08-07 ENCOUNTER — Telehealth: Payer: Self-pay | Admitting: *Deleted

## 2020-08-07 NOTE — Telephone Encounter (Signed)
Received his Evusheld injections yesterday. Asking if he would still be at high risk for complications if he were to get COVID now? Per [name]: The Evusheld has increased his immune response, but even though less, he still has risk of complications if he gets covid.

## 2020-08-10 ENCOUNTER — Other Ambulatory Visit: Payer: Self-pay | Admitting: Oncology

## 2020-08-10 ENCOUNTER — Other Ambulatory Visit (HOSPITAL_COMMUNITY): Payer: Self-pay | Admitting: Oncology

## 2020-08-10 ENCOUNTER — Ambulatory Visit (INDEPENDENT_AMBULATORY_CARE_PROVIDER_SITE_OTHER): Payer: Medicare PPO

## 2020-08-10 ENCOUNTER — Telehealth: Payer: Self-pay | Admitting: *Deleted

## 2020-08-10 ENCOUNTER — Telehealth: Payer: Self-pay | Admitting: Oncology

## 2020-08-10 DIAGNOSIS — R55 Syncope and collapse: Secondary | ICD-10-CM | POA: Diagnosis not present

## 2020-08-10 DIAGNOSIS — C9110 Chronic lymphocytic leukemia of B-cell type not having achieved remission: Secondary | ICD-10-CM

## 2020-08-10 NOTE — Telephone Encounter (Signed)
Scheduled appt per 2/21 sch msg - pt is aware of appt date and time

## 2020-08-10 NOTE — Telephone Encounter (Signed)
Patient left message requesting a conversation with [name] regarding his covid immunity after the Evusheld injections. Would like to discuss specifics of the clinical trial and how this impacts his immunity and his current status.  Requesting call at his leasure.

## 2020-08-10 NOTE — Telephone Encounter (Signed)
Per [name]: Please schedule for a video visit when MD has availability. Scheduling message sent and patient notified.

## 2020-08-11 ENCOUNTER — Inpatient Hospital Stay (HOSPITAL_BASED_OUTPATIENT_CLINIC_OR_DEPARTMENT_OTHER): Payer: Medicare PPO | Admitting: Oncology

## 2020-08-11 DIAGNOSIS — C9110 Chronic lymphocytic leukemia of B-cell type not having achieved remission: Secondary | ICD-10-CM | POA: Diagnosis not present

## 2020-08-11 NOTE — Progress Notes (Signed)
[place] OFFICE VISIT PROGRESS NOTE  I connected with [name] on 08/11/20 at  8:00 AM EST by telephone and verified that I am speaking with the correct person using two identifiers.   I discussed the limitations, risks, security and privacy concerns of performing an evaluation and management service by telemedicine and the availability of in-person appointments. I also discussed with the patient that there may be a patient responsible charge related to this service. The patient expressed understanding and agreed to proceed.    Patient's location: Home Provider's location: Office    Diagnosis: CLL  INTERVAL HISTORY:   [name] is seen today for a telehealth visit per his request.  We started as a video visit, but converted to a telephone visit as he was unable to connect on the computer.  [name] feels well.  He is walking for exercise.  No bleeding.  He continues acalabrutinib.  He received Evusheld last week.  He reports malaise following the injection.  No other apparent side effects.  He wants to discuss the indication for another Covid booster vaccine. Objective:   Lab Results:  Lab Results  Component Value Date   WBC 11.4 (H) 07/14/2020   HGB 15.2 07/14/2020   HCT 46.4 07/14/2020   MCV 93.5 07/14/2020   PLT 129 (L) 07/14/2020   NEUTROABS 6.4 07/14/2020     Medications: I have reviewed the patient's current medications.  Assessment/Plan: 1. CLL ? Review of the peripheral blood smear is consistent with chronic lymphocytic leukemia ? 12/16/2014 Peripheral blood flow cytometry consistent with CLL ? 10/16/2018 MRI brain and cervical spine - Prominent bulky cervical adenopathy within the visualized neck,compatible with history of CLL. ? FISH analysis- gain of 13q; 17p and 11q not detected ? Acalabrutinib 11/22/2018 2.thrombocytopenia-likely secondary to chronic lymphocytic leukemia versus "pseudo  thrombocytopenia"due to platelet clumping; progressive thrombocytopenia June 2018  Trial of prednisone started 10/17/2018 after admission with a fall/subarachnoid hemorrhage-starting dose 60 mg daily  Prednisone tapered to 40 mg daily beginning 10/22/2018  Prednisone taper to 30 mg daily beginning 10/29/2018, tapered to off over 6 days beginning 11/13/2018  Bone marrow biopsy 11/09/2018-hypercellular marrow with prominent involvement by CLL, abundant megakaryocytes, lymphocytes represent 82% of all cells, the cytogenetics returned normal 3.Hospitalization with sepsis/UTI with bacteremia (Enterobacter aerogenes)March 2018 4. Hospitalization for syncopal episode resulting in fall. Developed a small right subarachnoid hemorrhage.  5. MRI brain 10/16/2018 - Focal 1-2 cm osseous lesions involving the right parietal and occipital calvarium as above, indeterminate. Findings are of uncertain significance, with no definite corresponding osseous lesion seen on prior CT. 6.History of idiopathic "anaphylactoid "reactions 7.  Paroxysmal atrial fibrillation atrial fibrillation noted on a cardiac monitor 2020, not placed on anticoagulation secondary to thrombocytopenia     Disposition: [name] has CLL.  He has experienced a clinical response with acalabrutinib.  The CBC on 07/14/2020 revealed a normal hemoglobin and mild thrombocytopenia.  He will continue acalabrutinib.  He appears asymptomatic from the CLL.  [name] has received the COVID-19 vaccines and he was treated Evusheld on 08/06/2020.  I explained there is no recommendation for another Covid booster vaccine at present.  He remains immunocompromised and plans to continue social distancing and he will wear a mask.  [name] will return for an office visit as scheduled on 10/12/2020.  He understands the next office visit will take place at the [facility].   I discussed the assessment and treatment plan  with the patient. The  patient was provided an opportunity to ask questions and all were answered. The patient agreed with the plan and demonstrated an understanding of the instructions.   The patient was advised to call back or seek an in-person evaluation if the symptoms worsen or if the condition fails to improve as anticipated.  I provided 20 minutes of telephone, chart review, and documentation time during this encounter, and > 50% was spent counseling as documented under my assessment & plan.  Betsy Coder ANP/GNP-BC   08/11/2020 8:05 AM

## 2020-08-13 MED FILL — CALQUENCE 100 MG CAPSULE: 100 | 30 days supply | Qty: 30 | Fill #0

## 2020-08-14 NOTE — Progress Notes (Signed)
Carelink Summary Report / Loop Recorder 

## 2020-08-21 ENCOUNTER — Other Ambulatory Visit: Payer: Self-pay | Admitting: Physician Assistant

## 2020-08-21 DIAGNOSIS — C9110 Chronic lymphocytic leukemia of B-cell type not having achieved remission: Secondary | ICD-10-CM

## 2020-08-21 NOTE — Progress Notes (Signed)
I reviewed with [name] that we received updated FDA guidance that all patients who previously received Tixagevimab150mg /Cilgavimab150mg  should receive another 150mg  dose to equal the new updated dose of 300mg Tixagevimab/300mg  Cilgavimab.  Patient verbalized understanding and agreed to updated dose.  They will receive this dose 08/24/20 @ 9am.  Orders placed.  Nell Range PA-C

## 2020-08-24 ENCOUNTER — Inpatient Hospital Stay: Payer: Medicare PPO | Attending: Internal Medicine

## 2020-08-24 ENCOUNTER — Other Ambulatory Visit: Payer: Self-pay

## 2020-08-24 DIAGNOSIS — M779 Enthesopathy, unspecified: Secondary | ICD-10-CM | POA: Insufficient documentation

## 2020-08-24 DIAGNOSIS — D385 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: Secondary | ICD-10-CM | POA: Insufficient documentation

## 2020-08-24 DIAGNOSIS — T7840XA Allergy, unspecified, initial encounter: Secondary | ICD-10-CM | POA: Insufficient documentation

## 2020-08-24 DIAGNOSIS — R5383 Other fatigue: Secondary | ICD-10-CM | POA: Insufficient documentation

## 2020-08-24 DIAGNOSIS — N529 Male erectile dysfunction, unspecified: Secondary | ICD-10-CM | POA: Insufficient documentation

## 2020-08-24 DIAGNOSIS — Z283 Underimmunization status: Secondary | ICD-10-CM | POA: Insufficient documentation

## 2020-08-24 DIAGNOSIS — Z2839 Other underimmunization status: Secondary | ICD-10-CM | POA: Insufficient documentation

## 2020-08-24 DIAGNOSIS — I10 Essential (primary) hypertension: Secondary | ICD-10-CM | POA: Insufficient documentation

## 2020-08-24 DIAGNOSIS — M25571 Pain in right ankle and joints of right foot: Secondary | ICD-10-CM | POA: Insufficient documentation

## 2020-08-24 DIAGNOSIS — C9110 Chronic lymphocytic leukemia of B-cell type not having achieved remission: Secondary | ICD-10-CM

## 2020-08-24 DIAGNOSIS — R3129 Other microscopic hematuria: Secondary | ICD-10-CM | POA: Insufficient documentation

## 2020-08-24 DIAGNOSIS — R202 Paresthesia of skin: Secondary | ICD-10-CM | POA: Insufficient documentation

## 2020-08-24 DIAGNOSIS — R351 Nocturia: Secondary | ICD-10-CM | POA: Insufficient documentation

## 2020-08-24 DIAGNOSIS — M179 Osteoarthritis of knee, unspecified: Secondary | ICD-10-CM | POA: Insufficient documentation

## 2020-08-24 DIAGNOSIS — M545 Low back pain, unspecified: Secondary | ICD-10-CM | POA: Insufficient documentation

## 2020-08-24 DIAGNOSIS — R7303 Prediabetes: Secondary | ICD-10-CM | POA: Insufficient documentation

## 2020-08-24 DIAGNOSIS — G4700 Insomnia, unspecified: Secondary | ICD-10-CM | POA: Insufficient documentation

## 2020-08-24 DIAGNOSIS — Z0184 Encounter for antibody response examination: Secondary | ICD-10-CM | POA: Insufficient documentation

## 2020-08-24 DIAGNOSIS — H8110 Benign paroxysmal vertigo, unspecified ear: Secondary | ICD-10-CM | POA: Insufficient documentation

## 2020-08-24 DIAGNOSIS — R100 Acute abdomen: Secondary | ICD-10-CM | POA: Insufficient documentation

## 2020-08-24 DIAGNOSIS — M1710 Unilateral primary osteoarthritis, unspecified knee: Secondary | ICD-10-CM | POA: Insufficient documentation

## 2020-08-24 DIAGNOSIS — N401 Enlarged prostate with lower urinary tract symptoms: Secondary | ICD-10-CM | POA: Insufficient documentation

## 2020-08-24 DIAGNOSIS — R209 Unspecified disturbances of skin sensation: Secondary | ICD-10-CM | POA: Insufficient documentation

## 2020-08-24 DIAGNOSIS — Z86010 Personal history of colonic polyps: Secondary | ICD-10-CM | POA: Insufficient documentation

## 2020-08-24 DIAGNOSIS — E559 Vitamin D deficiency, unspecified: Secondary | ICD-10-CM | POA: Insufficient documentation

## 2020-08-24 DIAGNOSIS — Z79899 Other long term (current) drug therapy: Secondary | ICD-10-CM | POA: Insufficient documentation

## 2020-08-24 DIAGNOSIS — G43009 Migraine without aura, not intractable, without status migrainosus: Secondary | ICD-10-CM | POA: Insufficient documentation

## 2020-08-24 DIAGNOSIS — B356 Tinea cruris: Secondary | ICD-10-CM | POA: Insufficient documentation

## 2020-08-24 DIAGNOSIS — G479 Sleep disorder, unspecified: Secondary | ICD-10-CM | POA: Insufficient documentation

## 2020-08-24 DIAGNOSIS — R972 Elevated prostate specific antigen [PSA]: Secondary | ICD-10-CM | POA: Insufficient documentation

## 2020-08-24 DIAGNOSIS — W57XXXA Bitten or stung by nonvenomous insect and other nonvenomous arthropods, initial encounter: Secondary | ICD-10-CM | POA: Insufficient documentation

## 2020-08-24 DIAGNOSIS — K219 Gastro-esophageal reflux disease without esophagitis: Secondary | ICD-10-CM | POA: Insufficient documentation

## 2020-08-24 DIAGNOSIS — L259 Unspecified contact dermatitis, unspecified cause: Secondary | ICD-10-CM | POA: Insufficient documentation

## 2020-08-24 DIAGNOSIS — L820 Inflamed seborrheic keratosis: Secondary | ICD-10-CM | POA: Insufficient documentation

## 2020-08-24 DIAGNOSIS — S066X5A Traumatic subarachnoid hemorrhage with loss of consciousness greater than 24 hours with return to pre-existing conscious level, initial encounter: Secondary | ICD-10-CM | POA: Insufficient documentation

## 2020-08-24 DIAGNOSIS — J209 Acute bronchitis, unspecified: Secondary | ICD-10-CM | POA: Insufficient documentation

## 2020-08-24 HISTORY — DX: Essential (primary) hypertension: I10

## 2020-08-24 MED ORDER — CILGAVIMAB (PART OF EVUSHELD) INJECTION
150.0000 mg | Freq: Once | INTRAMUSCULAR | Status: AC
  Administered 2020-08-24: 150 mg via INTRAMUSCULAR
  Filled 2020-08-24: qty 1.5

## 2020-08-24 MED ORDER — TIXAGEVIMAB (PART OF EVUSHELD) INJECTION
150.0000 mg | Freq: Once | INTRAMUSCULAR | Status: AC
  Administered 2020-08-24: 150 mg via INTRAMUSCULAR
  Filled 2020-08-24: qty 1.5

## 2020-08-24 NOTE — Progress Notes (Signed)
Patient received second evusheld injection today. Patient monitored for 1 hour following injection. Patient had no issues with the injection and discharged in stable condition.

## 2020-09-09 MED FILL — CALQUENCE 100 MG CAPSULE: 100 | 30 days supply | Qty: 30 | Fill #1

## 2020-09-11 ENCOUNTER — Other Ambulatory Visit (HOSPITAL_COMMUNITY): Payer: Self-pay

## 2020-09-12 LAB — CUP PACEART REMOTE DEVICE CHECK
Date Time Interrogation Session: 20220319043005
Implantable Pulse Generator Implant Date: 20200430

## 2020-09-14 ENCOUNTER — Telehealth: Payer: Self-pay | Admitting: Oncology

## 2020-09-14 ENCOUNTER — Ambulatory Visit (INDEPENDENT_AMBULATORY_CARE_PROVIDER_SITE_OTHER): Payer: Medicare PPO

## 2020-09-14 DIAGNOSIS — R55 Syncope and collapse: Secondary | ICD-10-CM | POA: Diagnosis not present

## 2020-09-14 NOTE — Telephone Encounter (Signed)
I mailed updated appointment calendar to patient w/ change of location  

## 2020-09-16 ENCOUNTER — Ambulatory Visit: Payer: Medicare PPO | Admitting: Orthopaedic Surgery

## 2020-09-16 ENCOUNTER — Other Ambulatory Visit: Payer: Self-pay

## 2020-09-16 ENCOUNTER — Encounter: Payer: Self-pay | Admitting: Orthopaedic Surgery

## 2020-09-16 VITALS — Ht 63.0 in | Wt 165.0 lb

## 2020-09-16 DIAGNOSIS — M1711 Unilateral primary osteoarthritis, right knee: Secondary | ICD-10-CM

## 2020-09-16 MED ORDER — BUPIVACAINE HCL 0.25 % IJ SOLN
2.0000 mL | INTRAMUSCULAR | Status: AC | PRN
  Administered 2020-09-16: 2 mL via INTRA_ARTICULAR

## 2020-09-16 MED ORDER — LIDOCAINE HCL 1 % IJ SOLN
2.0000 mL | INTRAMUSCULAR | Status: AC | PRN
  Administered 2020-09-16: 2 mL

## 2020-09-16 NOTE — Progress Notes (Signed)
Office Visit Note   Patient: [name]           Date of Birth: 15-Feb-1941           MRN: [id number] Visit Date: 09/16/2020              Requested by: [name], MD [address] [place],  [place] [address] PCP: [name], MD   Assessment & Plan: Visit Diagnoses:  1. Unilateral primary osteoarthritis, right knee     Plan: Osteoarthritis right knee by films years ago.  Having recurrent symptoms particularly along the medial compartment.  Will inject with cortisone and monitor response.  Has had good response in the past  Follow-Up Instructions: Return if symptoms worsen or fail to improve.   Orders:  Orders Placed This Encounter  Procedures  . Large Joint Inj: R knee   No orders of the defined types were placed in this encounter.     Procedures: Large Joint Inj: R knee on 09/16/2020 10:30 AM Indications: pain and diagnostic evaluation Details: 25 G 1.5 in needle, anteromedial approach  Arthrogram: No  Medications: 2 mL lidocaine 1 %; 2 mL bupivacaine 0.25 %  12 mg betamethasone injected with Xylocaine and Marcaine the medial compartment right knee Procedure, treatment alternatives, risks and benefits explained, specific risks discussed. Consent was given by the patient. Immediately prior to procedure a time out was called to verify the correct patient, procedure, equipment, support staff and site/side marked as required. Patient was prepped and draped in the usual sterile fashion.       Clinical Data: No additional findings.   Subjective: Chief Complaint  Patient presents with  . Right Knee - Pain  Patient presents today for right knee pain. He said that he has received several cortisone injections over the years. He has been doing well. He states that he walks 70miles each morning. He has started to have some discomfort in his knee again and would like a cortisone injection. He does not take anything for pain.   HPI  Review of  Systems   Objective: Vital Signs: Ht 5\' 3"  (1.6 m)   Wt 165 lb (74.8 kg)   BMI 29.23 kg/m   Physical Exam Constitutional:      Appearance: He is well-developed.  Eyes:     Pupils: Pupils are equal, round, and reactive to light.  Pulmonary:     Effort: Pulmonary effort is normal.  Skin:    General: Skin is warm and dry.  Neurological:     Mental Status: He is alert and oriented to person, place, and time.  Psychiatric:        Behavior: Behavior normal.     Ortho Exam awake alert and oriented x3.  Comfortable sitting.  Right knee with very minimal effusion.  The knee was not warm or hot.  Some tenderness on the medial compartment diffusely but mild.  Full extension and flexion over 100 degrees without instability.  Minimal patellar crepitation but without pain with compression.  Specialty Comments:  No specialty comments available.  Imaging: No results found.   PMFS History: Patient Active Problem List   Diagnosis Date Noted  . Unilateral primary osteoarthritis, right knee 09/16/2020  . Acute abdomen 08/24/2020  . Allergy 08/24/2020  . Contact dermatitis 08/24/2020  . Microscopic hematuria 08/24/2020  . Benign paroxysmal positional vertigo 08/24/2020  . Benign prostatic hyperplasia with lower urinary tract symptoms 08/24/2020  . Bitten or stung by nonvenomous insect and  other nonvenomous arthropods, initial encounter 08/24/2020  . COVID-19 virus IgG antibody test result unknown 08/24/2020  . ED (erectile dysfunction) of organic origin 08/24/2020  . Elevated PSA 08/24/2020  . Fatigue 08/24/2020  . Gastro-esophageal reflux disease without esophagitis 08/24/2020  . Hepatitis A vaccination not up to date 08/24/2020  . History of adenomatous polyp of colon 08/24/2020  . Inflamed seborrheic keratosis 08/24/2020  . Insomnia 08/24/2020  . Low back pain 08/24/2020  . Migraine without aura, not refractory 08/24/2020  . 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 Nocturia 08/24/2020  . Osteoarthritis of knee 08/24/2020  . Other long term (current) drug therapy 08/24/2020  . Pain in right ankle and joints of right foot 08/24/2020  . Paresthesia of both hands 08/24/2020  . Prediabetes 08/24/2020  . Acute bronchitis 08/24/2020  . Tendinitis 08/24/2020  . Skin sensation disturbance 08/24/2020  . Sleep disorder 08/24/2020  . Subarachnoid hemorrhage following injury without open intracranial wound and with prolonged loss of consciousness (more than 24 hours) and return to pre-existing conscious level ([place]) 08/24/2020  . Tinea cruris 08/24/2020  . Vitamin D deficiency 08/24/2020  . Essential hypertension 08/24/2020  . Goals of care, counseling/discussion 11/13/2018  . Coronary artery disease involving native coronary artery of native heart without angina pectoris 11/01/2018  . Facial laceration 10/23/2018  . Elevated troponin 10/16/2018  . Mixed hyperlipidemia 10/15/2018  . Syncope 10/15/2018  . Nasal bone fracture 10/15/2018  . Chest tightness 10/15/2018  . SAH (subarachnoid hemorrhage) ([place]) 10/15/2018  . Acute renal failure superimposed on stage 3 chronic kidney disease ([place]) 10/15/2018  . Thrombocytopenia ([place]) 10/15/2018  . Fall 10/15/2018  . Carpal tunnel syndrome, bilateral 10/09/2017  . Right foot pain   . Enterobacter sepsis ([place]) 08/19/2016  . Bacteremia due to Enterobacter species 08/19/2016  . Acute encephalopathy 08/17/2016  . Hyperglycemia   . Urinary tract infection with hematuria   . AKI (acute kidney injury) ([facility]) 08/16/2016  . Hypokalemia 08/16/2016  . CLL (chronic lymphocytic leukemia) ([place]) 12/16/2014  . Dyspnea 09/20/2012   Past Medical History:  Diagnosis Date  . Acute encephalopathy 08/17/2016  . Acute renal failure superimposed on stage 3 chronic kidney disease ([place]) 10/15/2018  . AKI (acute kidney injury) ([facility]) 08/16/2016  . Arthritis   . Bacteremia due to Enterobacter species 08/19/2016  .  Carpal tunnel syndrome, bilateral 10/09/2017  . Chest tightness 10/15/2018  . CLL (chronic lymphocytic leukemia) ([facility]) 12/16/2014  . Dyspnea 09/20/2012  . Elevated troponin 10/16/2018  . Enterobacter sepsis ([place]) 08/19/2016  . Fall 10/15/2018  . GERD (gastroesophageal reflux disease)   . Hepatitis 1966   Drug reaction after taking medication  . Hyperglycemia   . Hyperlipemia   . Hypertension   . Hypokalemia 08/16/2016  . Lung nodule seen on imaging study    bilateral lungs  . Nasal bone fracture 10/15/2018  . Right foot pain   . SAH (subarachnoid hemorrhage) ([place]) 10/15/2018  . Syncope 10/15/2018  . Thrombocytopenia ([place]) 10/15/2018  . Urinary tract infection with hematuria     Family History  Problem Relation Age of Onset  . Sudden death Mother 45       possible botulism  . Dementia Father   . Seizures Sister     Past Surgical History:  Procedure Laterality Date  . APPENDECTOMY    . arthroscoyp  2000   left knee  . COLONOSCOPY W/ POLYPECTOMY    . LOOP RECORDER INSERTION N/A 10/18/2018   Procedure:  LOOP RECORDER INSERTION;  Surgeon: [name], MD;  Location: [facility];  Service: Cardiovascular;  Laterality: N/A;  . LUMBAR LAMINECTOMY/DECOMPRESSION MICRODISCECTOMY  06/27/2012   Procedure: LUMBAR LAMINECTOMY/DECOMPRESSION MICRODISCECTOMY 1 LEVEL;  Surgeon: [name], MD;  Location: [facility];  Service: Neurosurgery;  Laterality: Bilateral;  bilateral four-five laminectony  . SKIN CANCER EXCISION  5-6 years ago   moe-s surgery- basal b  . TONSILLECTOMY     Social History   Occupational History  . Occupation: Retired  Tobacco Use  . Smoking status: Never Smoker  . Smokeless tobacco: Never Used  Vaping Use  . Vaping Use: Never used  Substance and Sexual Activity  . Alcohol use: Yes    Alcohol/week: 2.0 standard drinks    Types: 2 Cans of beer per week    Comment: rare 2 beers per month  . Drug use: No  . Sexual activity: Not on file

## 2020-09-17 ENCOUNTER — Telehealth: Payer: Self-pay

## 2020-09-17 NOTE — Telephone Encounter (Signed)
Patient calls asking if he should get second [place] booster.  Advised him that since he has had Evischild that no rush to get just whenever he is ready. He verbalized an understanding.

## 2020-09-23 NOTE — Progress Notes (Signed)
Carelink Summary Report / Loop Recorder 

## 2020-10-06 ENCOUNTER — Other Ambulatory Visit (HOSPITAL_COMMUNITY): Payer: Self-pay

## 2020-10-06 MED FILL — Acalabrutinib Cap 100 MG: ORAL | 30 days supply | Qty: 30 | Fill #0 | Status: AC

## 2020-10-07 ENCOUNTER — Other Ambulatory Visit (HOSPITAL_COMMUNITY): Payer: Self-pay

## 2020-10-08 ENCOUNTER — Other Ambulatory Visit (HOSPITAL_COMMUNITY): Payer: Self-pay

## 2020-10-12 ENCOUNTER — Inpatient Hospital Stay: Payer: Medicare PPO

## 2020-10-12 ENCOUNTER — Other Ambulatory Visit: Payer: Medicare PPO

## 2020-10-12 ENCOUNTER — Inpatient Hospital Stay: Payer: Medicare PPO | Attending: Internal Medicine | Admitting: Oncology

## 2020-10-12 ENCOUNTER — Other Ambulatory Visit: Payer: Self-pay

## 2020-10-12 VITALS — BP 134/76 | HR 63 | Temp 97.8°F | Resp 18 | Ht 63.0 in | Wt 162.2 lb

## 2020-10-12 DIAGNOSIS — D696 Thrombocytopenia, unspecified: Secondary | ICD-10-CM | POA: Insufficient documentation

## 2020-10-12 DIAGNOSIS — Z79899 Other long term (current) drug therapy: Secondary | ICD-10-CM | POA: Insufficient documentation

## 2020-10-12 DIAGNOSIS — C9110 Chronic lymphocytic leukemia of B-cell type not having achieved remission: Secondary | ICD-10-CM | POA: Diagnosis not present

## 2020-10-12 LAB — CMP (CANCER CENTER ONLY)
ALT: 13 U/L (ref 0–44)
AST: 15 U/L (ref 15–41)
Albumin: 4.4 g/dL (ref 3.5–5.0)
Alkaline Phosphatase: 55 U/L (ref 38–126)
Anion gap: 8 (ref 5–15)
BUN: 26 mg/dL — ABNORMAL HIGH (ref 8–23)
CO2: 23 mmol/L (ref 22–32)
Calcium: 9.1 mg/dL (ref 8.9–10.3)
Chloride: 107 mmol/L (ref 98–111)
Creatinine: 1.34 mg/dL — ABNORMAL HIGH (ref 0.61–1.24)
GFR, Estimated: 54 mL/min — ABNORMAL LOW (ref >60)
Glucose, Bld: 88 mg/dL (ref 70–99)
Potassium: 4 mmol/L (ref 3.5–5.1)
Sodium: 138 mmol/L (ref 135–145)
Total Bilirubin: 0.9 mg/dL (ref 0.3–1.2)
Total Protein: 6.3 g/dL — ABNORMAL LOW (ref 6.5–8.1)

## 2020-10-12 LAB — CBC WITH DIFFERENTIAL (CANCER CENTER ONLY)
Abs Immature Granulocytes: 0.05 10*3/uL (ref 0.00–0.07)
Basophils Absolute: 0.1 10*3/uL (ref 0.0–0.1)
Basophils Relative: 1 %
Eosinophils Absolute: 0.4 10*3/uL (ref 0.0–0.5)
Eosinophils Relative: 4 %
HCT: 45.9 % (ref 39.0–52.0)
Hemoglobin: 15.3 g/dL (ref 13.0–17.0)
Immature Granulocytes: 1 %
Lymphocytes Relative: 24 %
Lymphs Abs: 2.4 10*3/uL (ref 0.7–4.0)
MCH: 31.2 pg (ref 26.0–34.0)
MCHC: 33.3 g/dL (ref 30.0–36.0)
MCV: 93.5 fL (ref 80.0–100.0)
Monocytes Absolute: 0.7 10*3/uL (ref 0.1–1.0)
Monocytes Relative: 7 %
Neutro Abs: 6.2 10*3/uL (ref 1.7–7.7)
Neutrophils Relative %: 63 %
Platelet Count: 154 10*3/uL (ref 150–400)
RBC: 4.91 MIL/uL (ref 4.22–5.81)
RDW: 12.3 % (ref 11.5–15.5)
WBC Count: 9.8 10*3/uL (ref 4.0–10.5)
nRBC: 0 % (ref 0.0–0.2)

## 2020-10-12 NOTE — Progress Notes (Signed)
  [facility] OFFICE PROGRESS NOTE   Diagnosis: CLL  INTERVAL HISTORY:   Mr. Richert returns as scheduled.  He continues acalabrutinib.  No bleeding, fever, or night sweats.  He is walking for exercise.  No recent infection.  He received Evusheld.  Objective:  Vital signs in last 24 hours:  Blood pressure 134/76, pulse 63, temperature 97.8 F (36.6 C), temperature source Tympanic, resp. rate 18, height $RemoveBe'5\' 3"'rOHNyAEfv$  (1.6 m), weight 162 lb 3.2 oz (73.6 kg), SpO2 99 %.    Lymphatics: No cervical, supraclavicular, axillary, or inguinal nodes Resp: Lungs clear bilaterally Cardio: Regular rate and rhythm GI: No hepatosplenomegaly Vascular: No leg edema    Lab Results:  Lab Results  Component Value Date   WBC 9.8 10/12/2020   HGB 15.3 10/12/2020   HCT 45.9 10/12/2020   MCV 93.5 10/12/2020   PLT 154 10/12/2020   NEUTROABS 6.2 10/12/2020    CMP  Lab Results  Component Value Date   NA 138 10/12/2020   K 4.0 10/12/2020   CL 107 10/12/2020   CO2 23 10/12/2020   GLUCOSE 88 10/12/2020   BUN 26 (H) 10/12/2020   CREATININE 1.34 (H) 10/12/2020   CALCIUM 9.1 10/12/2020   PROT 6.3 (L) 10/12/2020   ALBUMIN 4.4 10/12/2020   AST 15 10/12/2020   ALT 13 10/12/2020   ALKPHOS 55 10/12/2020   BILITOT 0.9 10/12/2020   GFRNONAA 54 (L) 10/12/2020   GFRAA 54 (L) 03/08/2020     Medications: I have reviewed the patient's current medications.   Assessment/Plan: 1. CLL ? Review of the peripheral blood smear is consistent with chronic lymphocytic leukemia ? 12/16/2014 Peripheral blood flow cytometry consistent with CLL ? 10/16/2018 MRI brain and cervical spine - Prominent bulky cervical adenopathy within the visualized neck,compatible with history of CLL. ? FISH analysis- gain of 13q; 17p and 11q not detected ? Acalabrutinib 11/22/2018 2.thrombocytopenia-likely secondary to chronic lymphocytic leukemia versus "pseudo thrombocytopenia"due to platelet clumping; progressive  thrombocytopenia June 2018  Trial of prednisone started 10/17/2018 after admission with a fall/subarachnoid hemorrhage-starting dose 60 mg daily  Prednisone tapered to 40 mg daily beginning 10/22/2018  Prednisone taper to 30 mg daily beginning 10/29/2018, tapered to off over 6 days beginning 11/13/2018  Bone marrow biopsy 11/09/2018-hypercellular marrow with prominent involvement by CLL, abundant megakaryocytes, lymphocytes represent 82% of all cells, the cytogenetics returned normal 3.Hospitalization with sepsis/UTI with bacteremia (Enterobacter aerogenes)March 2018 4. Hospitalization for syncopal episode resulting in fall. Developed a small right subarachnoid hemorrhage.  5. MRI brain 10/16/2018 - Focal 1-2 cm osseous lesions involving the right parietal and occipital calvarium as above, indeterminate. Findings are of uncertain significance, with no definite corresponding osseous lesion seen on prior CT. 6.History of idiopathic "anaphylactoid "reactions 7.  Paroxysmal atrial fibrillation atrial fibrillation noted on a cardiac monitor 2020, not placed on anticoagulation secondary to thrombocytopenia     Disposition: Mr. Cuccaro remains asymptomatic from CLL.  The lymphocyte count and platelets are normal.  He will continue acalabrutinib.  He will return for lab visit in 3 months and an office visit in 6 months.  He will schedule a 23 valent pneumococcal vaccine with Dr. Inda Merlin if he has not received this within the past 5 years.  Betsy Coder, MD  10/12/2020  9:30 AM

## 2020-10-19 ENCOUNTER — Ambulatory Visit (INDEPENDENT_AMBULATORY_CARE_PROVIDER_SITE_OTHER): Payer: Medicare PPO

## 2020-10-19 DIAGNOSIS — R55 Syncope and collapse: Secondary | ICD-10-CM

## 2020-10-19 LAB — CUP PACEART REMOTE DEVICE CHECK
Date Time Interrogation Session: 20220430232226
Implantable Pulse Generator Implant Date: 20200430

## 2020-10-30 ENCOUNTER — Telehealth: Payer: Self-pay

## 2020-10-30 ENCOUNTER — Other Ambulatory Visit: Payer: Self-pay | Admitting: Oncology

## 2020-10-30 ENCOUNTER — Other Ambulatory Visit (HOSPITAL_COMMUNITY): Payer: Self-pay

## 2020-10-30 NOTE — Telephone Encounter (Signed)
Pt left a voicemail about going on a trip for about 9 days.  He states he do not want to take the monitor because he will be going to about 4 different cities and it will be a hassle. The patient states he will be doing some errands and we can call him after 3 pm or he will call back Monday. He can be reached at [phone].

## 2020-11-02 ENCOUNTER — Other Ambulatory Visit (HOSPITAL_COMMUNITY): Payer: Self-pay

## 2020-11-02 MED ORDER — CALQUENCE 100 MG PO CAPS
ORAL_CAPSULE | ORAL | 2 refills | Status: DC
  Filled 2020-11-02: qty 30, 30d supply, fill #0
  Filled 2020-12-09: qty 30, 30d supply, fill #1
  Filled 2021-01-07: qty 30, 30d supply, fill #2

## 2020-11-03 NOTE — Telephone Encounter (Signed)
I left a message letting the patient know that we received his message. I told him our rule of thumb is if the patient is going to be gone for 7 or more days to take the monitor with them. I told him if he decides not to take the monitor it is okay because his implanted device will store the information until he return home. When he get near the monitor it will send [place] the data.

## 2020-11-06 NOTE — Progress Notes (Signed)
Carelink Summary Report / Loop Recorder 

## 2020-11-11 ENCOUNTER — Telehealth: Payer: Self-pay

## 2020-11-11 NOTE — Telephone Encounter (Signed)
TC from Pt stating he went on a trip for 7 days to visit family and forgot to take his calquence. Pt wanted to know if he should come in for lab work. He states he feels fine. Informed Pt that per Dr Benay Spice he can restart his medication and if he has any symptoms he can give a return call to the office and come in for lab work. Pt. Verbalized understanding. No further problems or concerns noted.

## 2020-11-22 LAB — CUP PACEART REMOTE DEVICE CHECK
Date Time Interrogation Session: 20220602232159
Implantable Pulse Generator Implant Date: 20200430

## 2020-11-23 ENCOUNTER — Ambulatory Visit (INDEPENDENT_AMBULATORY_CARE_PROVIDER_SITE_OTHER): Payer: Medicare PPO

## 2020-11-23 DIAGNOSIS — R55 Syncope and collapse: Secondary | ICD-10-CM

## 2020-12-09 ENCOUNTER — Other Ambulatory Visit (HOSPITAL_COMMUNITY): Payer: Self-pay

## 2020-12-14 ENCOUNTER — Other Ambulatory Visit (HOSPITAL_COMMUNITY): Payer: Self-pay

## 2020-12-14 NOTE — Progress Notes (Signed)
Carelink Summary Report / Loop Recorder 

## 2020-12-23 DIAGNOSIS — L821 Other seborrheic keratosis: Secondary | ICD-10-CM | POA: Diagnosis not present

## 2020-12-23 DIAGNOSIS — Z85828 Personal history of other malignant neoplasm of skin: Secondary | ICD-10-CM | POA: Diagnosis not present

## 2020-12-23 DIAGNOSIS — L570 Actinic keratosis: Secondary | ICD-10-CM | POA: Diagnosis not present

## 2020-12-23 DIAGNOSIS — D225 Melanocytic nevi of trunk: Secondary | ICD-10-CM | POA: Diagnosis not present

## 2020-12-23 DIAGNOSIS — L84 Corns and callosities: Secondary | ICD-10-CM | POA: Diagnosis not present

## 2020-12-23 DIAGNOSIS — D1801 Hemangioma of skin and subcutaneous tissue: Secondary | ICD-10-CM | POA: Diagnosis not present

## 2020-12-23 DIAGNOSIS — L578 Other skin changes due to chronic exposure to nonionizing radiation: Secondary | ICD-10-CM | POA: Diagnosis not present

## 2020-12-27 LAB — CUP PACEART REMOTE DEVICE CHECK
Date Time Interrogation Session: 20220705233456
Implantable Pulse Generator Implant Date: 20200430

## 2020-12-28 ENCOUNTER — Ambulatory Visit (INDEPENDENT_AMBULATORY_CARE_PROVIDER_SITE_OTHER): Payer: Medicare PPO

## 2020-12-28 DIAGNOSIS — R55 Syncope and collapse: Secondary | ICD-10-CM | POA: Diagnosis not present

## 2021-01-06 ENCOUNTER — Encounter: Payer: Self-pay | Admitting: Oncology

## 2021-01-06 ENCOUNTER — Other Ambulatory Visit: Payer: Self-pay | Admitting: *Deleted

## 2021-01-06 DIAGNOSIS — C9110 Chronic lymphocytic leukemia of B-cell type not having achieved remission: Secondary | ICD-10-CM

## 2021-01-06 NOTE — Progress Notes (Signed)
Placed referral for 6 month booster of Evusheld due in September.

## 2021-01-07 ENCOUNTER — Other Ambulatory Visit (HOSPITAL_COMMUNITY): Payer: Self-pay

## 2021-01-07 DIAGNOSIS — H5213 Myopia, bilateral: Secondary | ICD-10-CM | POA: Diagnosis not present

## 2021-01-07 DIAGNOSIS — H25813 Combined forms of age-related cataract, bilateral: Secondary | ICD-10-CM | POA: Diagnosis not present

## 2021-01-11 ENCOUNTER — Inpatient Hospital Stay: Payer: Medicare PPO | Attending: Internal Medicine

## 2021-01-11 ENCOUNTER — Other Ambulatory Visit: Payer: Self-pay

## 2021-01-11 DIAGNOSIS — C9110 Chronic lymphocytic leukemia of B-cell type not having achieved remission: Secondary | ICD-10-CM | POA: Diagnosis not present

## 2021-01-11 LAB — CBC WITH DIFFERENTIAL (CANCER CENTER ONLY)
Abs Immature Granulocytes: 0.01 10*3/uL (ref 0.00–0.07)
Basophils Absolute: 0.1 10*3/uL (ref 0.0–0.1)
Basophils Relative: 1 %
Eosinophils Absolute: 0.3 10*3/uL (ref 0.0–0.5)
Eosinophils Relative: 3 %
HCT: 46.6 % (ref 39.0–52.0)
Hemoglobin: 15.3 g/dL (ref 13.0–17.0)
Immature Granulocytes: 0 %
Lymphocytes Relative: 27 %
Lymphs Abs: 2.5 10*3/uL (ref 0.7–4.0)
MCH: 31 pg (ref 26.0–34.0)
MCHC: 32.8 g/dL (ref 30.0–36.0)
MCV: 94.5 fL (ref 80.0–100.0)
Monocytes Absolute: 0.6 10*3/uL (ref 0.1–1.0)
Monocytes Relative: 6 %
Neutro Abs: 5.7 10*3/uL (ref 1.7–7.7)
Neutrophils Relative %: 63 %
Platelet Count: 125 10*3/uL — ABNORMAL LOW (ref 150–400)
RBC: 4.93 MIL/uL (ref 4.22–5.81)
RDW: 12.6 % (ref 11.5–15.5)
WBC Count: 9 10*3/uL (ref 4.0–10.5)
nRBC: 0 % (ref 0.0–0.2)

## 2021-01-11 LAB — CMP (CANCER CENTER ONLY)
ALT: 10 U/L (ref 0–44)
AST: 14 U/L — ABNORMAL LOW (ref 15–41)
Albumin: 4.5 g/dL (ref 3.5–5.0)
Alkaline Phosphatase: 57 U/L (ref 38–126)
Anion gap: 7 (ref 5–15)
BUN: 31 mg/dL — ABNORMAL HIGH (ref 8–23)
CO2: 25 mmol/L (ref 22–32)
Calcium: 8.8 mg/dL — ABNORMAL LOW (ref 8.9–10.3)
Chloride: 108 mmol/L (ref 98–111)
Creatinine: 1.41 mg/dL — ABNORMAL HIGH (ref 0.61–1.24)
GFR, Estimated: 51 mL/min — ABNORMAL LOW (ref >60)
Glucose, Bld: 93 mg/dL (ref 70–99)
Potassium: 4.2 mmol/L (ref 3.5–5.1)
Sodium: 140 mmol/L (ref 135–145)
Total Bilirubin: 1 mg/dL (ref 0.3–1.2)
Total Protein: 6.4 g/dL — ABNORMAL LOW (ref 6.5–8.1)

## 2021-01-14 ENCOUNTER — Other Ambulatory Visit (HOSPITAL_COMMUNITY): Payer: Self-pay

## 2021-01-15 ENCOUNTER — Telehealth: Payer: Self-pay | Admitting: *Deleted

## 2021-01-15 NOTE — Telephone Encounter (Signed)
-----   Message from Ladell Pier, MD sent at 01/11/2021  4:41 PM EDT ----- Please call patient, the CBC is stable, continue acalabrutinib, follow-up as scheduled, be sure he has received his every 73-monthdose of evusheld

## 2021-01-15 NOTE — Telephone Encounter (Signed)
Notified patient of stable CBC results and to continue his acalabrutinib and f/u as scheduled. Referral has already been placed for his Evusheld dose due in September. He reports that he had acalabrutinib ordered early for a 9 day trip in May and he forgot to take the med with him, so he was 9 days without.

## 2021-01-19 DIAGNOSIS — S61551A Open bite of right wrist, initial encounter: Secondary | ICD-10-CM | POA: Diagnosis not present

## 2021-01-19 DIAGNOSIS — W5501XA Bitten by cat, initial encounter: Secondary | ICD-10-CM | POA: Diagnosis not present

## 2021-01-19 NOTE — Progress Notes (Signed)
Carelink Summary Report / Loop Recorder 

## 2021-02-01 ENCOUNTER — Ambulatory Visit (INDEPENDENT_AMBULATORY_CARE_PROVIDER_SITE_OTHER): Payer: Medicare PPO

## 2021-02-01 DIAGNOSIS — R55 Syncope and collapse: Secondary | ICD-10-CM

## 2021-02-01 LAB — CUP PACEART REMOTE DEVICE CHECK
Date Time Interrogation Session: 20220808005556
Implantable Pulse Generator Implant Date: 20200430

## 2021-02-10 ENCOUNTER — Other Ambulatory Visit: Payer: Self-pay | Admitting: Oncology

## 2021-02-10 ENCOUNTER — Other Ambulatory Visit (HOSPITAL_COMMUNITY): Payer: Self-pay

## 2021-02-10 MED ORDER — CALQUENCE 100 MG PO CAPS
ORAL_CAPSULE | ORAL | 2 refills | Status: DC
  Filled 2021-02-10: qty 30, 30d supply, fill #0
  Filled 2021-03-10: qty 30, 30d supply, fill #1

## 2021-02-15 ENCOUNTER — Other Ambulatory Visit (HOSPITAL_COMMUNITY): Payer: Self-pay

## 2021-02-18 ENCOUNTER — Encounter: Payer: Self-pay | Admitting: Oncology

## 2021-02-19 NOTE — Progress Notes (Signed)
Carelink Summary Report / Loop Recorder 

## 2021-02-22 ENCOUNTER — Encounter: Payer: Self-pay | Admitting: Oncology

## 2021-03-01 ENCOUNTER — Encounter: Payer: Self-pay | Admitting: Oncology

## 2021-03-02 ENCOUNTER — Encounter: Payer: Self-pay | Admitting: Oncology

## 2021-03-05 ENCOUNTER — Inpatient Hospital Stay: Payer: Medicare PPO

## 2021-03-08 ENCOUNTER — Ambulatory Visit (INDEPENDENT_AMBULATORY_CARE_PROVIDER_SITE_OTHER): Payer: Medicare PPO

## 2021-03-08 DIAGNOSIS — R55 Syncope and collapse: Secondary | ICD-10-CM | POA: Diagnosis not present

## 2021-03-09 LAB — CUP PACEART REMOTE DEVICE CHECK
Date Time Interrogation Session: 20220910005419
Implantable Pulse Generator Implant Date: 20200430

## 2021-03-10 ENCOUNTER — Other Ambulatory Visit (HOSPITAL_COMMUNITY): Payer: Self-pay

## 2021-03-12 NOTE — Progress Notes (Signed)
Carelink Summary Report / Loop Recorder 

## 2021-03-16 ENCOUNTER — Other Ambulatory Visit (HOSPITAL_COMMUNITY): Payer: Self-pay

## 2021-03-17 ENCOUNTER — Other Ambulatory Visit: Payer: Self-pay | Admitting: Nurse Practitioner

## 2021-03-17 ENCOUNTER — Inpatient Hospital Stay: Payer: Medicare PPO | Attending: Internal Medicine

## 2021-03-17 ENCOUNTER — Other Ambulatory Visit: Payer: Self-pay

## 2021-03-17 DIAGNOSIS — C9110 Chronic lymphocytic leukemia of B-cell type not having achieved remission: Secondary | ICD-10-CM | POA: Insufficient documentation

## 2021-03-17 DIAGNOSIS — Z298 Encounter for other specified prophylactic measures: Secondary | ICD-10-CM | POA: Diagnosis not present

## 2021-03-17 DIAGNOSIS — Z9221 Personal history of antineoplastic chemotherapy: Secondary | ICD-10-CM | POA: Insufficient documentation

## 2021-03-17 MED ORDER — CILGAVIMAB (PART OF EVUSHELD) INJECTION
300.0000 mg | Freq: Once | INTRAMUSCULAR | Status: AC
  Administered 2021-03-17: 300 mg via INTRAMUSCULAR

## 2021-03-17 MED ORDER — TIXAGEVIMAB (PART OF EVUSHELD) INJECTION
300.0000 mg | Freq: Once | INTRAMUSCULAR | Status: AC
  Administered 2021-03-17: 300 mg via INTRAMUSCULAR

## 2021-03-17 NOTE — Progress Notes (Signed)
Patient present today for Evusheld injection. Observed for 30 minutes post injection with no concerns or complaints.

## 2021-03-17 NOTE — Patient Instructions (Signed)
Tixagevimab; Cilgavimab Solutions for Injection What is this medication? TIXAGEVIMAB; CILGAVIMAB (tix a jev i mab; sil gav i mab) reduces the risk of getting COVID-19 in persons with immune system problems who may not respond properly to the COVID-19 vaccine or persons who can't receive the COVID-19 vaccine. It belongs to a group of medications called monoclonal antibodies. It may decrease the risk of developing severe symptoms of COVID-19. It may also decrease the chance of going to the hospital. This medication is not approved by the FDA. The FDA has authorized emergency use of this medication during the COVID-19 pandemic. This medicine may be used for other purposes; ask your health care provider or pharmacist if you have questions. COMMON BRAND NAME(S): EVUSHELD What should I tell my care team before I take this medication? They need to know if you have any of these conditions: any allergies any serious illness bleeding disorder have received a COVID-19 vaccine heart disease an unusual or allergic reaction to tixagevimab, cilgavimab, other medications, foods, dyes, or preservatives pregnant or trying to get pregnant breast-feeding How should I use this medication? This medication is injected into a muscle. It is given by your care team in a hospital or clinic setting. Talk to your care team about the use of this medication in children. While it may be given to children as young as 12 years, precautions do apply. Overdosage: If you think you have taken too much of this medicine contact a poison control center or emergency room at once. NOTE: This medicine is only for you. Do not share this medicine with others. What if I miss a dose? Keep appointments for follow-up doses. It is important not to miss your dose. Call your care team if you are unable to keep an appointment. What may interact with this medication? COVID-19 vaccines This list may not describe all possible interactions. Give  your health care provider a list of all the medicines, herbs, non-prescription drugs, or dietary supplements you use. Also tell them if you smoke, drink alcohol, or use illegal drugs. Some items may interact with your medicine. What should I watch for while using this medication? Your condition will be monitored carefully while you are receiving this medication. Visit your care team for regular checks on your progress. Tell your care team if your symptoms do not start to get better or if they get worse. After receiving this medication, wait at least 14 days before getting the COVID-19 vaccine. What side effects may I notice from receiving this medication? Side effects that you should report to your care team as soon as possible: Allergic reactions-skin rash, itching, hives, swelling of the face, lips, tongue, or throat Heart attack-pain or tightness in the chest, shoulders, arms, or jaw, nausea, shortness of breath, cold or clammy skin, feeling faint or lightheaded Heart failure-shortness of breath, swelling of the ankles, feet, or hands, sudden weight gain, unusual weakness or fatigue Heart rhythm changes-fast or irregular heartbeat, dizziness, feeling faint or lightheaded, chest pain, trouble breathing Side effects that usually do not require medical attention (report these to your care team if they continue or are bothersome): Cough Fatigue Headache Pain, redness, or irritation at site where injected This list may not describe all possible side effects. Call your doctor for medical advice about side effects. You may report side effects to FDA at 1-800-FDA-1088. Where should I keep my medication? This medication is given in a hospital or clinic. It will not be stored at home. NOTE: This sheet is  a summary. It may not cover all possible information. If you have questions about this medicine, talk to your doctor, pharmacist, or health care provider.  2022 Elsevier/Gold Standard (2020-05-28  16:22:16)

## 2021-03-23 ENCOUNTER — Ambulatory Visit (HOSPITAL_BASED_OUTPATIENT_CLINIC_OR_DEPARTMENT_OTHER): Payer: Medicare PPO | Admitting: Cardiology

## 2021-03-23 ENCOUNTER — Other Ambulatory Visit: Payer: Self-pay

## 2021-03-23 VITALS — BP 146/80 | HR 60 | Ht 63.0 in | Wt 165.6 lb

## 2021-03-23 DIAGNOSIS — I2510 Atherosclerotic heart disease of native coronary artery without angina pectoris: Secondary | ICD-10-CM | POA: Diagnosis not present

## 2021-03-23 DIAGNOSIS — I480 Paroxysmal atrial fibrillation: Secondary | ICD-10-CM

## 2021-03-23 DIAGNOSIS — C9110 Chronic lymphocytic leukemia of B-cell type not having achieved remission: Secondary | ICD-10-CM

## 2021-03-23 DIAGNOSIS — E782 Mixed hyperlipidemia: Secondary | ICD-10-CM

## 2021-03-23 DIAGNOSIS — R55 Syncope and collapse: Secondary | ICD-10-CM

## 2021-03-23 DIAGNOSIS — S066X5D Traumatic subarachnoid hemorrhage with loss of consciousness greater than 24 hours with return to pre-existing conscious level, subsequent encounter: Secondary | ICD-10-CM

## 2021-03-23 NOTE — Assessment & Plan Note (Signed)
Well-controlled.  No aspirin secondary to prior subarachnoid as well as CLL and low platelets at times.  Continue with low-dose Toprol current medical management.  No changes.  Prior coronary CT reviewed.  Reassurance.

## 2021-03-23 NOTE — Assessment & Plan Note (Signed)
Continue with simvastatin 20 mg a day.  No myalgias.  Last LDL 73.  Hemoglobin A1c 5.4.  Hemoglobin 15.3 creatinine 1.4

## 2021-03-23 NOTE — Progress Notes (Signed)
Cardiology Office Note:    Date:  03/23/2021   ID:  STEPHONE GUM, DOB [birth year], MRN [id number]  PCP:  [name], MD   [place]  Cardiologist:  Candee Furbish, MD  Advanced Practice Provider:  No care team member to display Electrophysiologist:  Will [name], MD      Referring MD: [name], MD    History of Present Illness:    [name] is a 80 y.o. male here for the follow-up of coronary artery disease, hypertension and hyperlipidemia.  Previously had a small right subarachnoid hemorrhage after syncopal episode resulting in fall.  Echocardiogram in 2020 showed normal pump function, hyperdynamic with minimal gradient.  Had not been on anticoagulation.  Thankfully has not demonstrated any further episodes of paroxysmal atrial fibrillation.  He has a loop recorder in place monitoring, no atrial fibrillation noted.  Previously was enjoying yoga once a week.  He had some concerns that he felt mildly winded when walking.  Felt some mild pressure when walking.  Overall was doing very well without any worsening symptoms.  Walked about 2-1/2 miles a day.  His CLL has been stable.  Immunocompromised.  [name] is his wife.  English major  His sisters' husband is having a stent placed in [place], [place].  Today: Overall he is feeling well. Since his last visit he denies feeling any episodes of atrial fibrillation. His loop recorder has been in place since May 2020.  For exercise he is walking 2 miles each morning with no exertional symptoms.  He denies any palpitations, chest pain, or shortness of breath. No lightheadedness, headaches, syncope, orthopnea, or PND. Also has no lower extremity edema.  Lately he has been taking Calquence once a day.   Past Medical History:  Diagnosis Date   Acute encephalopathy 08/17/2016   Acute renal failure superimposed on stage 3 chronic kidney disease ([place]) 10/15/2018   AKI (acute kidney injury)  ([place]) 08/16/2016   Arthritis    Bacteremia due to Enterobacter species 08/19/2016   Carpal tunnel syndrome, bilateral 10/09/2017   Chest tightness 10/15/2018   CLL (chronic lymphocytic leukemia) ([place]) 12/16/2014   Dyspnea 09/20/2012   Elevated troponin 10/16/2018   Enterobacter sepsis ([place]) 08/19/2016   Fall 10/15/2018   GERD (gastroesophageal reflux disease)    Hepatitis 1966   Drug reaction after taking medication   Hyperglycemia    Hyperlipemia    Hypertension    Hypokalemia 08/16/2016   Lung nodule seen on imaging study    bilateral lungs   Nasal bone fracture 10/15/2018   Right foot pain    SAH (subarachnoid hemorrhage) ([place]) 10/15/2018   Syncope 10/15/2018   Thrombocytopenia ([place]) 10/15/2018   Urinary tract infection with hematuria     Past Surgical History:  Procedure Laterality Date   APPENDECTOMY     arthroscoyp  2000   left knee   COLONOSCOPY W/ POLYPECTOMY     LOOP RECORDER INSERTION N/A 10/18/2018   Procedure: LOOP RECORDER INSERTION;  Surgeon: [name], MD;  Location: [facility];  Service: Cardiovascular;  Laterality: N/A;   LUMBAR LAMINECTOMY/DECOMPRESSION MICRODISCECTOMY  06/27/2012   Procedure: LUMBAR LAMINECTOMY/DECOMPRESSION MICRODISCECTOMY 1 LEVEL;  Surgeon: [name], MD;  Location: [facility];  Service: Neurosurgery;  Laterality: Bilateral;  bilateral four-five laminectony   SKIN CANCER EXCISION  5-6 years ago   moe-s surgery- basal b   TONSILLECTOMY      Current Medications: Current Meds  Medication Sig  acalabrutinib (CALQUENCE) 100 MG capsule TAKE 1 CAPSULE (100 MG) BY MOUTH ONCE A DAY.   acetaminophen (TYLENOL) 325 MG tablet Take 325 mg by mouth daily as needed for headache (pain).   cholecalciferol (VITAMIN D3) 25 MCG (1000 UNIT) tablet Take 1,000 Units by mouth daily.    EPINEPHrine (EPIPEN) 0.3 mg/0.3 mL DEVI Inject 0.3 mLs (0.3 mg total) into the muscle once.   Loratadine 10 MG CAPS Take 10 mg by mouth daily.    metoprolol  succinate (TOPROL-XL) 25 MG 24 hr tablet Take 1 tablet (25 mg total) by mouth daily.   simvastatin (ZOCOR) 40 MG tablet Take 20 mg by mouth daily.      Allergies:   Ace inhibitors   Social History   Socioeconomic History   Marital status: Married    Spouse name: [name]    Number of children: 2   Years of education: PhD   Highest education level: Not on file  Occupational History   Occupation: Retired  Tobacco Use   Smoking status: Never   Smokeless tobacco: Never  Vaping Use   Vaping Use: Never used  Substance and Sexual Activity   Alcohol use: Yes    Alcohol/week: 2.0 standard drinks    Types: 2 Cans of beer per week    Comment: rare 2 beers per month   Drug use: No   Sexual activity: Not on file  Other Topics Concern   Not on file  Social History Narrative   Lives with wife   Caffeine use: 1 cup coffee per day   Diet coke      Right handed    Social Determinants of Health   Financial Resource Strain: Not on file  Food Insecurity: Not on file  Transportation Needs: Not on file  Physical Activity: Not on file  Stress: Not on file  Social Connections: Not on file     Family History: The patient's family history includes Dementia in his father; Seizures in his sister; Sudden death (age of onset: 89) in his mother.  ROS:   Please see the history of present illness.    All other systems reviewed and are negative.  EKGs/Labs/Other Studies Reviewed:    The following studies were reviewed today:  CTA Head and Neck 03/08/2020: FINDINGS: CT HEAD FINDINGS Brain: There is no mass, hemorrhage or extra-axial collection. Posterior left convexity meningioma measures 11 mm. The size and configuration of the ventricles and extra-axial CSF spaces are normal. There is no acute or chronic infarction. The brain parenchyma is normal.   Skull: The visualized skull base, calvarium and extracranial soft tissues are normal.   Sinuses/Orbits: No fluid levels or advanced  mucosal thickening of the visualized paranasal sinuses. No mastoid or middle ear effusion. The orbits are normal.   CTA NECK FINDINGS SKELETON: There is no bony spinal canal stenosis. No lytic or blastic lesion.   OTHER NECK: Normal pharynx, larynx and major salivary glands. No cervical lymphadenopathy. Unremarkable thyroid gland.   UPPER CHEST: No pneumothorax or pleural effusion. No nodules or masses.   AORTIC ARCH: There is mild calcific atherosclerosis of the aortic arch. There is no aneurysm, dissection or hemodynamically significant stenosis of the visualized portion of the aorta. Conventional 3 vessel aortic branching pattern. The visualized proximal subclavian arteries are widely patent.   RIGHT CAROTID SYSTEM: Normal without aneurysm, dissection or stenosis.   LEFT CAROTID SYSTEM: Normal without aneurysm, dissection or stenosis.   VERTEBRAL ARTERIES: Right dominant configuration. Both origins are clearly  patent. There is no dissection, occlusion or flow-limiting stenosis to the skull base (V1-V3 segments).   CTA HEAD FINDINGS POSTERIOR CIRCULATION: --Vertebral arteries: Normal V4 segments.   --Inferior cerebellar arteries: Normal.   --Basilar artery: Normal.   --Superior cerebellar arteries: Normal.   --Posterior cerebral arteries (PCA): Normal.   ANTERIOR CIRCULATION: --Intracranial internal carotid arteries: Atherosclerotic calcification of the internal carotid arteries at the skull base without hemodynamically significant stenosis.   --Anterior cerebral arteries (ACA): Normal. Hypoplastic left A1 segment, normal variant   --Middle cerebral arteries (MCA): Normal.   VENOUS SINUSES: As permitted by contrast timing, patent.   ANATOMIC VARIANTS: Both P comms are patent.   Review of the MIP images confirms the above findings.   IMPRESSION: 1. No emergent large vessel occlusion or high-grade stenosis of the head or neck. 2. Posterior left convexity  meningioma measures 11 mm.   Aortic Atherosclerosis (ICD10-I70.0).  10/17/18  CT scan of coronary arteries-distal RCA occluded LAD nonsignificant, 0.84 FFR    IMPRESSION: 1. Coronary artery calcium score 63 Agatston units. This places the patient in the 23rd percentile for age and gender, suggesting low risk for future cardiac events.   2. 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 The PDA and PLV fill with contrast, suggesting collaterals.   3.  Mild stenosis proximal LAD.   4. Artifact obscures a portion of OM1 but doubt significant disease.   Echo 2020-EF greater than 65% 25 mm dynamic LV gradient.   Implantable loop recorder-normal sinus rhythm  EKG:  EKG is personally reviewed and interpreted. 03/23/2021: Sinus rhythm. Rate 60 bpm. 01/27/2020: sinus bradycardia 54 with no other abnormalities  Recent Labs: 01/11/2021: ALT 10; BUN 31; Creatinine 1.41; Hemoglobin 15.3; Platelet Count 125; Potassium 4.2; Sodium 140   Recent Lipid Panel    Component Value Date/Time   CHOL 106 10/16/2018 0339   TRIG 125 10/16/2018 0339   HDL 20 (L) 10/16/2018 0339   CHOLHDL 5.3 10/16/2018 0339   VLDL 25 10/16/2018 0339   LDLCALC 61 10/16/2018 0339     Risk Assessment/Calculations:      Physical Exam:     VS:  BP (!) 146/80   Pulse 60   Ht 5\' 3"  (1.6 m)   Wt 165 lb 9.6 oz (75.1 kg)   BMI 29.33 kg/m     Wt Readings from Last 3 Encounters:  03/23/21 165 lb 9.6 oz (75.1 kg)  10/12/20 162 lb 3.2 oz (73.6 kg)  09/16/20 165 lb (74.8 kg)     GEN: Well nourished, well developed in no acute distress HEENT: Normal NECK: No JVD; No carotid bruits LYMPHATICS: No lymphadenopathy CARDIAC: RRR, no murmurs, rubs, gallops RESPIRATORY:  Clear to auscultation without rales, wheezing or rhonchi  ABDOMEN: Soft, non-tender, non-distended MUSCULOSKELETAL:  No edema; No deformity  SKIN: Warm and dry NEUROLOGIC:  Alert and oriented x 3 PSYCHIATRIC:  Normal affect   ASSESSMENT:    1. Coronary  artery disease involving native coronary artery of native heart without angina pectoris   2. Subarachnoid hemorrhage following injury without open intracranial wound and with prolonged loss of consciousness (more than 24 hours) and return to pre-existing conscious level, subsequent encounter   3. CLL (chronic lymphocytic leukemia) ([employer])   4. Syncope, unspecified syncope type   5. Paroxysmal atrial fibrillation (HCC)   6. Mixed hyperlipidemia     PLAN:    In order of problems listed above: Subarachnoid hemorrhage following injury without open intracranial wound and with prolonged loss of consciousness (  more than 24 hours) and return to pre-existing conscious level ([place]) Has been stable since 2020.  Syncopal episode resulting in fall.  Small subarachnoid resolved.  Coronary artery disease involving native coronary artery of native heart without angina pectoris Well-controlled.  No aspirin secondary to prior subarachnoid as well as CLL and low platelets at times.  Continue with low-dose Toprol current medical management.  No changes.  Prior coronary CT reviewed.  Reassurance.  CLL (chronic lymphocytic leukemia) (HCC) [name] has been monitoring.  Platelet count 125 last check.  Continuing with current medication.  Syncope No further episodes since 2020.  Continue with hydration especially with exercise.  Loop recorder has been reassuring.  Paroxysmal atrial fibrillation (HCC) Implantable loop recorder carefully monitored.  No further evidence of A. fib.  He is not on anticoagulation because of prior subarachnoid hemorrhage after fall.  We discussed potential watchman device if atrial fibrillation were to return.  Mixed hyperlipidemia Continue with simvastatin 20 mg a day.  No myalgias.  Last LDL 73.  Hemoglobin A1c 5.4.  Hemoglobin 15.3 creatinine 1.4     1 year follow-up   Medication Adjustments/Labs and Tests Ordered: Current medicines are reviewed at length with the patient  today.  Concerns regarding medicines are outlined above.  Orders Placed This Encounter  Procedures   EKG 12-Lead    No orders of the defined types were placed in this encounter.   Patient Instructions  Medication Instructions:  The current medical regimen is effective;  continue present plan and medications.  *If you need a refill on your cardiac medications before your next appointment, please call your pharmacy*  Follow-Up: At [facility], you and your health needs are our priority.  As part of our continuing mission to provide you with exceptional heart care, we have created designated Provider Care Teams.  These Care Teams include your primary Cardiologist (physician) and Advanced Practice Providers (APPs -  Physician Assistants and Nurse Practitioners) who all work together to provide you with the care you need, when you need it.  We recommend signing up for the patient portal called "MyChart".  Sign up information is provided on this After Visit Summary.  MyChart is used to connect with patients for Virtual Visits (Telemedicine).  Patients are able to view lab/test results, encounter notes, upcoming appointments, etc.  Non-urgent messages can be sent to your provider as well.   To learn more about what you can do with MyChart, go to [email or web address].    Your next appointment:   1 year(s)  The format for your next appointment:   In Person  Provider:   Candee Furbish, MD   Thank you for choosing [facility]!!     I,Mathew Stumpf,acting as a scribe for Candee Furbish, MD.,have documented all relevant documentation on the behalf of Candee Furbish, MD,as directed by  Candee Furbish, MD while in the presence of Candee Furbish, MD.  I, Candee Furbish, MD, have reviewed all documentation for this visit. The documentation on 03/23/21 for the exam, diagnosis, procedures, and orders are all accurate and complete.   Signed, Candee Furbish, MD  03/23/2021 10:56 AM    [facility]

## 2021-03-23 NOTE — Assessment & Plan Note (Signed)
No further episodes since 2020.  Continue with hydration especially with exercise.  Loop recorder has been reassuring.

## 2021-03-23 NOTE — Assessment & Plan Note (Signed)
Implantable loop recorder carefully monitored.  No further evidence of A. fib.  He is not on anticoagulation because of prior subarachnoid hemorrhage after fall.  We discussed potential watchman device if atrial fibrillation were to return.

## 2021-03-23 NOTE — Patient Instructions (Signed)
Medication Instructions:  The current medical regimen is effective;  continue present plan and medications.  *If you need a refill on your cardiac medications before your next appointment, please call your pharmacy*  Follow-Up: At [facility], you and your health needs are our priority.  As part of our continuing mission to provide you with exceptional heart care, we have created designated Provider Care Teams.  These Care Teams include your primary Cardiologist (physician) and Advanced Practice Providers (APPs -  Physician Assistants and Nurse Practitioners) who all work together to provide you with the care you need, when you need it.  We recommend signing up for the patient portal called "MyChart".  Sign up information is provided on this After Visit Summary.  MyChart is used to connect with patients for Virtual Visits (Telemedicine).  Patients are able to view lab/test results, encounter notes, upcoming appointments, etc.  Non-urgent messages can be sent to your provider as well.   To learn more about what you can do with MyChart, go to [email or web address].    Your next appointment:   1 year(s)  The format for your next appointment:   In Person  Provider:   Mark Skains, MD   Thank you for choosing [facility]!!    

## 2021-03-23 NOTE — Assessment & Plan Note (Signed)
Has been stable since 2020.  Syncopal episode resulting in fall.  Small subarachnoid resolved.

## 2021-03-23 NOTE — Assessment & Plan Note (Signed)
[name] has been monitoring.  Platelet count 125 last check.  Continuing with current medication.

## 2021-03-24 ENCOUNTER — Other Ambulatory Visit: Payer: Self-pay | Admitting: Pharmacist

## 2021-03-24 ENCOUNTER — Other Ambulatory Visit (HOSPITAL_COMMUNITY): Payer: Self-pay

## 2021-03-24 DIAGNOSIS — C9110 Chronic lymphocytic leukemia of B-cell type not having achieved remission: Secondary | ICD-10-CM

## 2021-03-24 MED ORDER — CALQUENCE 100 MG PO TABS
100.0000 mg | ORAL_TABLET | Freq: Every day | ORAL | 0 refills | Status: DC
  Filled 2021-03-24: qty 60, fill #0
  Filled 2021-04-08: qty 30, 30d supply, fill #0

## 2021-03-29 DIAGNOSIS — C9110 Chronic lymphocytic leukemia of B-cell type not having achieved remission: Secondary | ICD-10-CM | POA: Diagnosis not present

## 2021-03-29 DIAGNOSIS — Z0000 Encounter for general adult medical examination without abnormal findings: Secondary | ICD-10-CM | POA: Diagnosis not present

## 2021-03-29 DIAGNOSIS — G479 Sleep disorder, unspecified: Secondary | ICD-10-CM | POA: Diagnosis not present

## 2021-03-29 DIAGNOSIS — I2510 Atherosclerotic heart disease of native coronary artery without angina pectoris: Secondary | ICD-10-CM | POA: Diagnosis not present

## 2021-03-29 DIAGNOSIS — Z79899 Other long term (current) drug therapy: Secondary | ICD-10-CM | POA: Diagnosis not present

## 2021-03-29 DIAGNOSIS — Z1389 Encounter for screening for other disorder: Secondary | ICD-10-CM | POA: Diagnosis not present

## 2021-03-29 DIAGNOSIS — R311 Benign essential microscopic hematuria: Secondary | ICD-10-CM | POA: Diagnosis not present

## 2021-03-29 DIAGNOSIS — K219 Gastro-esophageal reflux disease without esophagitis: Secondary | ICD-10-CM | POA: Diagnosis not present

## 2021-03-29 DIAGNOSIS — R972 Elevated prostate specific antigen [PSA]: Secondary | ICD-10-CM | POA: Diagnosis not present

## 2021-03-29 DIAGNOSIS — E782 Mixed hyperlipidemia: Secondary | ICD-10-CM | POA: Diagnosis not present

## 2021-03-29 DIAGNOSIS — I10 Essential (primary) hypertension: Secondary | ICD-10-CM | POA: Diagnosis not present

## 2021-03-29 DIAGNOSIS — E559 Vitamin D deficiency, unspecified: Secondary | ICD-10-CM | POA: Diagnosis not present

## 2021-03-31 ENCOUNTER — Ambulatory Visit: Payer: Medicare PPO | Admitting: Cardiology

## 2021-04-08 ENCOUNTER — Other Ambulatory Visit (HOSPITAL_COMMUNITY): Payer: Self-pay

## 2021-04-12 ENCOUNTER — Ambulatory Visit (INDEPENDENT_AMBULATORY_CARE_PROVIDER_SITE_OTHER): Payer: Medicare PPO

## 2021-04-12 DIAGNOSIS — R55 Syncope and collapse: Secondary | ICD-10-CM

## 2021-04-13 ENCOUNTER — Inpatient Hospital Stay: Payer: Medicare PPO

## 2021-04-13 ENCOUNTER — Other Ambulatory Visit: Payer: Self-pay

## 2021-04-13 ENCOUNTER — Inpatient Hospital Stay: Payer: Medicare PPO | Attending: Internal Medicine | Admitting: Oncology

## 2021-04-13 VITALS — BP 147/75 | HR 83 | Temp 97.8°F | Resp 18 | Ht 63.0 in | Wt 166.2 lb

## 2021-04-13 DIAGNOSIS — Z856 Personal history of leukemia: Secondary | ICD-10-CM | POA: Diagnosis not present

## 2021-04-13 DIAGNOSIS — C9110 Chronic lymphocytic leukemia of B-cell type not having achieved remission: Secondary | ICD-10-CM

## 2021-04-13 DIAGNOSIS — D696 Thrombocytopenia, unspecified: Secondary | ICD-10-CM | POA: Diagnosis not present

## 2021-04-13 LAB — CBC WITH DIFFERENTIAL (CANCER CENTER ONLY)
Abs Immature Granulocytes: 0.03 10*3/uL (ref 0.00–0.07)
Basophils Absolute: 0.1 10*3/uL (ref 0.0–0.1)
Basophils Relative: 1 %
Eosinophils Absolute: 0.3 10*3/uL (ref 0.0–0.5)
Eosinophils Relative: 3 %
HCT: 45.7 % (ref 39.0–52.0)
Hemoglobin: 14.9 g/dL (ref 13.0–17.0)
Immature Granulocytes: 0 %
Lymphocytes Relative: 31 %
Lymphs Abs: 2.7 10*3/uL (ref 0.7–4.0)
MCH: 30.8 pg (ref 26.0–34.0)
MCHC: 32.6 g/dL (ref 30.0–36.0)
MCV: 94.4 fL (ref 80.0–100.0)
Monocytes Absolute: 0.6 10*3/uL (ref 0.1–1.0)
Monocytes Relative: 6 %
Neutro Abs: 5.2 10*3/uL (ref 1.7–7.7)
Neutrophils Relative %: 59 %
Platelet Count: 132 10*3/uL — ABNORMAL LOW (ref 150–400)
RBC: 4.84 MIL/uL (ref 4.22–5.81)
RDW: 12.9 % (ref 11.5–15.5)
WBC Count: 8.9 10*3/uL (ref 4.0–10.5)
nRBC: 0 % (ref 0.0–0.2)

## 2021-04-13 LAB — CMP (CANCER CENTER ONLY)
ALT: 9 U/L (ref 0–44)
AST: 13 U/L — ABNORMAL LOW (ref 15–41)
Albumin: 4.3 g/dL (ref 3.5–5.0)
Alkaline Phosphatase: 62 U/L (ref 38–126)
Anion gap: 5 (ref 5–15)
BUN: 22 mg/dL (ref 8–23)
CO2: 28 mmol/L (ref 22–32)
Calcium: 9 mg/dL (ref 8.9–10.3)
Chloride: 103 mmol/L (ref 98–111)
Creatinine: 1.22 mg/dL (ref 0.61–1.24)
GFR, Estimated: >60 mL/min (ref >60)
Glucose, Bld: 93 mg/dL (ref 70–99)
Potassium: 4.1 mmol/L (ref 3.5–5.1)
Sodium: 136 mmol/L (ref 135–145)
Total Bilirubin: 0.7 mg/dL (ref 0.3–1.2)
Total Protein: 6.2 g/dL — ABNORMAL LOW (ref 6.5–8.1)

## 2021-04-13 NOTE — Progress Notes (Signed)
     [facility] OFFICE PROGRESS NOTE   Diagnosis: CLL  INTERVAL HISTORY:   [name] returns as scheduled.  He continues acalabrutinib.  No fever, bleeding, arthralgias, or recent infection.  He walks daily.  Good appetite.  No syncope events.  Objective:  Vital signs in last 24 hours:  Blood pressure (!) 147/75, pulse 83, temperature 97.8 F (36.6 C), temperature source Oral, resp. rate 18, height _0  (1.6 m), weight 166 lb 3.2 oz (75.4 kg), SpO2 99 %.    Lymphatics: No cervical, supraclavicular, axillary, or inguinal nodes Resp: Lungs clear bilaterally Cardio: Regular rate and rhythm GI: No hepatosplenomegaly Vascular: No leg edema  Lab Results:  Lab Results  Component Value Date   WBC 8.9 04/13/2021   HGB 14.9 04/13/2021   HCT 45.7 04/13/2021   MCV 94.4 04/13/2021   PLT 132 (L) 04/13/2021   NEUTROABS 5.2 04/13/2021    CMP  Lab Results  Component Value Date   NA 136 04/13/2021   K 4.1 04/13/2021   CL 103 04/13/2021   CO2 28 04/13/2021   GLUCOSE 93 04/13/2021   BUN 22 04/13/2021   CREATININE 1.22 04/13/2021   CALCIUM 9.0 04/13/2021   PROT 6.2 (L) 04/13/2021   ALBUMIN 4.3 04/13/2021   AST 13 (L) 04/13/2021   ALT 9 04/13/2021   ALKPHOS 62 04/13/2021   BILITOT 0.7 04/13/2021   GFRNONAA >60 04/13/2021   GFRAA 54 (L) 03/08/2020    Medications: I have reviewed the patient's current medications.   Assessment/Plan: CLL Review of the peripheral blood smear is consistent with chronic lymphocytic leukemia 12/16/2014 Peripheral blood flow cytometry consistent with CLL 10/16/2018 MRI brain and cervical spine - Prominent bulky cervical adenopathy within the visualized neck, compatible with history of CLL. FISH analysis- gain of 13q; 17p and 11q not detected Acalabrutinib 11/22/2018 2. thrombocytopenia-likely secondary to chronic lymphocytic leukemia versus "pseudo thrombocytopenia" due to platelet clumping; progressive thrombocytopenia June  2018 Trial of prednisone started 10/17/2018 after admission with a fall/subarachnoid hemorrhage-starting dose 60 mg daily Prednisone tapered to 40 mg daily beginning 10/22/2018 Prednisone taper to 30 mg daily beginning 10/29/2018, tapered to off over 6 days beginning 11/13/2018 Bone marrow biopsy 11/09/2018-hypercellular marrow with prominent involvement by CLL, abundant megakaryocytes, lymphocytes represent 82% of all cells, the cytogenetics returned normal 3. Hospitalization with sepsis/UTI with bacteremia (Enterobacter aerogenes) March 2018 4. Hospitalization for syncopal episode resulting in fall. Developed a small right subarachnoid hemorrhage.  5. MRI brain 10/16/2018 - Focal 1-2 cm osseous lesions involving the right parietal and occipital calvarium as above, indeterminate. Findings are of uncertain significance, with no definite corresponding osseous lesion seen on prior CT. 6.  History of idiopathic "anaphylactoid "reactions 7.  Paroxysmal atrial fibrillation atrial fibrillation noted on a cardiac monitor 2020, not placed on anticoagulation secondary to thrombocytopenia        Disposition: [name] appears stable.  He will continue acalabrutinib.  He will return for lab visit in 3 months and an office visit in 6 months.  He will be due for [place] when he is here in 6 months.  He will stay up-to-date on the influenza and pneumonia vaccines.  Betsy Coder, MD  04/13/2021  10:02 AM

## 2021-04-14 ENCOUNTER — Other Ambulatory Visit (HOSPITAL_COMMUNITY): Payer: Self-pay

## 2021-04-20 NOTE — Progress Notes (Signed)
Carelink Summary Report / Loop Recorder 

## 2021-05-04 ENCOUNTER — Ambulatory Visit (INDEPENDENT_AMBULATORY_CARE_PROVIDER_SITE_OTHER): Payer: Medicare PPO

## 2021-05-04 DIAGNOSIS — R55 Syncope and collapse: Secondary | ICD-10-CM | POA: Diagnosis not present

## 2021-05-04 LAB — CUP PACEART REMOTE DEVICE CHECK
Date Time Interrogation Session: 20221114235520
Implantable Pulse Generator Implant Date: 20200430

## 2021-05-10 ENCOUNTER — Other Ambulatory Visit: Payer: Self-pay | Admitting: Oncology

## 2021-05-10 ENCOUNTER — Other Ambulatory Visit (HOSPITAL_COMMUNITY): Payer: Self-pay

## 2021-05-10 DIAGNOSIS — C9110 Chronic lymphocytic leukemia of B-cell type not having achieved remission: Secondary | ICD-10-CM

## 2021-05-10 MED ORDER — CALQUENCE 100 MG PO TABS
100.0000 mg | ORAL_TABLET | Freq: Every day | ORAL | 0 refills | Status: DC
  Filled 2021-05-10: qty 30, 30d supply, fill #0

## 2021-05-12 NOTE — Progress Notes (Signed)
Carelink Summary Report / Loop Recorder 

## 2021-05-14 ENCOUNTER — Other Ambulatory Visit (HOSPITAL_COMMUNITY): Payer: Self-pay

## 2021-06-07 ENCOUNTER — Ambulatory Visit (INDEPENDENT_AMBULATORY_CARE_PROVIDER_SITE_OTHER): Payer: Medicare PPO

## 2021-06-07 DIAGNOSIS — R55 Syncope and collapse: Secondary | ICD-10-CM

## 2021-06-07 LAB — CUP PACEART REMOTE DEVICE CHECK
Date Time Interrogation Session: 20221217235935
Implantable Pulse Generator Implant Date: 20200430

## 2021-06-10 ENCOUNTER — Other Ambulatory Visit: Payer: Self-pay | Admitting: Oncology

## 2021-06-10 ENCOUNTER — Other Ambulatory Visit (HOSPITAL_COMMUNITY): Payer: Self-pay

## 2021-06-10 DIAGNOSIS — C9110 Chronic lymphocytic leukemia of B-cell type not having achieved remission: Secondary | ICD-10-CM

## 2021-06-10 MED ORDER — CALQUENCE 100 MG PO TABS
100.0000 mg | ORAL_TABLET | Freq: Every day | ORAL | 1 refills | Status: DC
Start: 2021-06-10 — End: 2021-08-02
  Filled 2021-06-10: qty 30, 30d supply, fill #0
  Filled 2021-07-07: qty 30, 30d supply, fill #1

## 2021-06-15 ENCOUNTER — Other Ambulatory Visit (HOSPITAL_COMMUNITY): Payer: Self-pay

## 2021-06-16 NOTE — Progress Notes (Signed)
Carelink Summary Report / Loop Recorder 

## 2021-06-29 ENCOUNTER — Inpatient Hospital Stay: Payer: Medicare PPO | Attending: Internal Medicine

## 2021-06-29 ENCOUNTER — Other Ambulatory Visit: Payer: Self-pay

## 2021-06-29 DIAGNOSIS — C9110 Chronic lymphocytic leukemia of B-cell type not having achieved remission: Secondary | ICD-10-CM

## 2021-06-29 DIAGNOSIS — D696 Thrombocytopenia, unspecified: Secondary | ICD-10-CM | POA: Diagnosis not present

## 2021-06-29 LAB — CMP (CANCER CENTER ONLY)
ALT: 10 U/L (ref 0–44)
AST: 16 U/L (ref 15–41)
Albumin: 4.4 g/dL (ref 3.5–5.0)
Alkaline Phosphatase: 55 U/L (ref 38–126)
Anion gap: 8 (ref 5–15)
BUN: 25 mg/dL — ABNORMAL HIGH (ref 8–23)
CO2: 27 mmol/L (ref 22–32)
Calcium: 9.3 mg/dL (ref 8.9–10.3)
Chloride: 105 mmol/L (ref 98–111)
Creatinine: 1.35 mg/dL — ABNORMAL HIGH (ref 0.61–1.24)
GFR, Estimated: 53 mL/min — ABNORMAL LOW (ref >60)
Glucose, Bld: 92 mg/dL (ref 70–99)
Potassium: 4.5 mmol/L (ref 3.5–5.1)
Sodium: 140 mmol/L (ref 135–145)
Total Bilirubin: 0.8 mg/dL (ref 0.3–1.2)
Total Protein: 6.5 g/dL (ref 6.5–8.1)

## 2021-06-29 LAB — CBC WITH DIFFERENTIAL (CANCER CENTER ONLY)
Abs Immature Granulocytes: 0.03 10*3/uL (ref 0.00–0.07)
Basophils Absolute: 0.1 10*3/uL (ref 0.0–0.1)
Basophils Relative: 1 %
Eosinophils Absolute: 0.3 10*3/uL (ref 0.0–0.5)
Eosinophils Relative: 3 %
HCT: 48.4 % (ref 39.0–52.0)
Hemoglobin: 15.7 g/dL (ref 13.0–17.0)
Immature Granulocytes: 0 %
Lymphocytes Relative: 27 %
Lymphs Abs: 2.3 10*3/uL (ref 0.7–4.0)
MCH: 30.5 pg (ref 26.0–34.0)
MCHC: 32.4 g/dL (ref 30.0–36.0)
MCV: 94.2 fL (ref 80.0–100.0)
Monocytes Absolute: 0.6 10*3/uL (ref 0.1–1.0)
Monocytes Relative: 7 %
Neutro Abs: 5.4 10*3/uL (ref 1.7–7.7)
Neutrophils Relative %: 62 %
Platelet Count: 127 10*3/uL — ABNORMAL LOW (ref 150–400)
RBC: 5.14 MIL/uL (ref 4.22–5.81)
RDW: 12.3 % (ref 11.5–15.5)
WBC Count: 8.6 10*3/uL (ref 4.0–10.5)
nRBC: 0 % (ref 0.0–0.2)

## 2021-07-07 ENCOUNTER — Other Ambulatory Visit (HOSPITAL_COMMUNITY): Payer: Self-pay

## 2021-07-12 ENCOUNTER — Other Ambulatory Visit (HOSPITAL_COMMUNITY): Payer: Self-pay

## 2021-07-12 ENCOUNTER — Ambulatory Visit (INDEPENDENT_AMBULATORY_CARE_PROVIDER_SITE_OTHER): Payer: Medicare PPO

## 2021-07-12 DIAGNOSIS — R55 Syncope and collapse: Secondary | ICD-10-CM

## 2021-07-12 LAB — CUP PACEART REMOTE DEVICE CHECK
Date Time Interrogation Session: 20230122234632
Implantable Pulse Generator Implant Date: 20200430

## 2021-07-22 NOTE — Progress Notes (Signed)
Carelink Summary Report / Loop Recorder 

## 2021-07-27 ENCOUNTER — Other Ambulatory Visit: Payer: Self-pay | Admitting: Cardiology

## 2021-08-02 ENCOUNTER — Other Ambulatory Visit (HOSPITAL_COMMUNITY): Payer: Self-pay

## 2021-08-02 ENCOUNTER — Other Ambulatory Visit: Payer: Self-pay | Admitting: Oncology

## 2021-08-02 DIAGNOSIS — C9110 Chronic lymphocytic leukemia of B-cell type not having achieved remission: Secondary | ICD-10-CM

## 2021-08-02 MED ORDER — CALQUENCE 100 MG PO TABS
100.0000 mg | ORAL_TABLET | Freq: Every day | ORAL | 2 refills | Status: DC
Start: 2021-08-02 — End: 2021-11-05
  Filled 2021-08-12: qty 30, 30d supply, fill #0
  Filled 2021-09-02: qty 30, 30d supply, fill #1
  Filled 2021-10-05: qty 30, 30d supply, fill #2

## 2021-08-05 DIAGNOSIS — L84 Corns and callosities: Secondary | ICD-10-CM | POA: Diagnosis not present

## 2021-08-05 DIAGNOSIS — L309 Dermatitis, unspecified: Secondary | ICD-10-CM | POA: Diagnosis not present

## 2021-08-12 ENCOUNTER — Other Ambulatory Visit (HOSPITAL_COMMUNITY): Payer: Self-pay

## 2021-08-15 LAB — CUP PACEART REMOTE DEVICE CHECK
Date Time Interrogation Session: 20230224234744
Implantable Pulse Generator Implant Date: 20200430

## 2021-08-16 ENCOUNTER — Ambulatory Visit (INDEPENDENT_AMBULATORY_CARE_PROVIDER_SITE_OTHER): Payer: Medicare PPO

## 2021-08-16 DIAGNOSIS — R55 Syncope and collapse: Secondary | ICD-10-CM

## 2021-08-19 NOTE — Progress Notes (Signed)
Carelink Summary Report / Loop Recorder 

## 2021-09-02 ENCOUNTER — Other Ambulatory Visit (HOSPITAL_COMMUNITY): Payer: Self-pay

## 2021-09-15 ENCOUNTER — Other Ambulatory Visit (HOSPITAL_COMMUNITY): Payer: Self-pay

## 2021-09-17 DIAGNOSIS — Z03818 Encounter for observation for suspected exposure to other biological agents ruled out: Secondary | ICD-10-CM | POA: Diagnosis not present

## 2021-09-17 DIAGNOSIS — J069 Acute upper respiratory infection, unspecified: Secondary | ICD-10-CM | POA: Diagnosis not present

## 2021-09-20 ENCOUNTER — Ambulatory Visit (INDEPENDENT_AMBULATORY_CARE_PROVIDER_SITE_OTHER): Payer: Medicare PPO

## 2021-09-20 DIAGNOSIS — R55 Syncope and collapse: Secondary | ICD-10-CM | POA: Diagnosis not present

## 2021-09-20 LAB — CUP PACEART REMOTE DEVICE CHECK
Date Time Interrogation Session: 20230401000647
Implantable Pulse Generator Implant Date: 20200430

## 2021-09-27 ENCOUNTER — Inpatient Hospital Stay: Payer: Medicare PPO

## 2021-09-27 ENCOUNTER — Inpatient Hospital Stay: Payer: Medicare PPO | Attending: Internal Medicine | Admitting: Oncology

## 2021-09-27 VITALS — BP 150/70 | HR 65 | Temp 98.1°F | Resp 18 | Ht 63.0 in | Wt 164.0 lb

## 2021-09-27 DIAGNOSIS — C9110 Chronic lymphocytic leukemia of B-cell type not having achieved remission: Secondary | ICD-10-CM

## 2021-09-27 DIAGNOSIS — D696 Thrombocytopenia, unspecified: Secondary | ICD-10-CM | POA: Diagnosis not present

## 2021-09-27 DIAGNOSIS — Z79899 Other long term (current) drug therapy: Secondary | ICD-10-CM | POA: Diagnosis not present

## 2021-09-27 LAB — CBC WITH DIFFERENTIAL (CANCER CENTER ONLY)
Abs Immature Granulocytes: 0.04 10*3/uL (ref 0.00–0.07)
Basophils Absolute: 0.1 10*3/uL (ref 0.0–0.1)
Basophils Relative: 1 %
Eosinophils Absolute: 0.2 10*3/uL (ref 0.0–0.5)
Eosinophils Relative: 2 %
HCT: 44.2 % (ref 39.0–52.0)
Hemoglobin: 14.4 g/dL (ref 13.0–17.0)
Immature Granulocytes: 0 %
Lymphocytes Relative: 23 %
Lymphs Abs: 2.4 10*3/uL (ref 0.7–4.0)
MCH: 30.5 pg (ref 26.0–34.0)
MCHC: 32.6 g/dL (ref 30.0–36.0)
MCV: 93.6 fL (ref 80.0–100.0)
Monocytes Absolute: 0.7 10*3/uL (ref 0.1–1.0)
Monocytes Relative: 6 %
Neutro Abs: 7.2 10*3/uL (ref 1.7–7.7)
Neutrophils Relative %: 68 %
Platelet Count: 165 10*3/uL (ref 150–400)
RBC: 4.72 MIL/uL (ref 4.22–5.81)
RDW: 12.1 % (ref 11.5–15.5)
WBC Count: 10.5 10*3/uL (ref 4.0–10.5)
nRBC: 0 % (ref 0.0–0.2)

## 2021-09-27 LAB — CMP (CANCER CENTER ONLY)
ALT: 7 U/L (ref 0–44)
AST: 12 U/L — ABNORMAL LOW (ref 15–41)
Albumin: 4.5 g/dL (ref 3.5–5.0)
Alkaline Phosphatase: 56 U/L (ref 38–126)
Anion gap: 9 (ref 5–15)
BUN: 24 mg/dL — ABNORMAL HIGH (ref 8–23)
CO2: 26 mmol/L (ref 22–32)
Calcium: 9.3 mg/dL (ref 8.9–10.3)
Chloride: 106 mmol/L (ref 98–111)
Creatinine: 1.33 mg/dL — ABNORMAL HIGH (ref 0.61–1.24)
GFR, Estimated: 54 mL/min — ABNORMAL LOW (ref >60)
Glucose, Bld: 97 mg/dL (ref 70–99)
Potassium: 4.4 mmol/L (ref 3.5–5.1)
Sodium: 141 mmol/L (ref 135–145)
Total Bilirubin: 0.8 mg/dL (ref 0.3–1.2)
Total Protein: 6.4 g/dL — ABNORMAL LOW (ref 6.5–8.1)

## 2021-09-27 NOTE — Progress Notes (Signed)
?[facility] ?OFFICE PROGRESS NOTE ? ? ?Diagnosis: CLL ? ?INTERVAL HISTORY:  ? ?[name] returns as scheduled.  He feels well.  He continues out acalabrutinib.  He recently had a 24-hour upper respiratory infection with a sore throat and chest discomfort.  He reports negative COVID testing.  He walks 2 miles daily.  No arthralgias.  No bleeding or bruising. ? ?Objective: ? ?Vital signs in last 24 hours: ? ?Blood pressure (!) 150/70, pulse 65, temperature 98.1 ?F (36.7 ?C), temperature source Oral, resp. rate 18, height 5' 3"  (1.6 m), weight 164 lb (74.4 kg), SpO2 100 %. ?  ? ?Lymphatics: No cervical, supraclavicular, axillary, or inguinal nodes ?Resp: Lungs clear bilaterally ?Cardio: Regular rate and rhythm ?GI: No hepatosplenomegaly, no mass ?Vascular: No leg edema  ? ?Lab Results: ? ?Lab Results  ?Component Value Date  ? WBC 10.5 09/27/2021  ? HGB 14.4 09/27/2021  ? HCT 44.2 09/27/2021  ? MCV 93.6 09/27/2021  ? PLT 165 09/27/2021  ? NEUTROABS 7.2 09/27/2021  ? ? ?CMP  ?Lab Results  ?Component Value Date  ? NA 141 09/27/2021  ? K 4.4 09/27/2021  ? CL 106 09/27/2021  ? CO2 26 09/27/2021  ? GLUCOSE 97 09/27/2021  ? BUN 24 (H) 09/27/2021  ? CREATININE 1.33 (H) 09/27/2021  ? CALCIUM 9.3 09/27/2021  ? PROT 6.4 (L) 09/27/2021  ? ALBUMIN 4.5 09/27/2021  ? AST 12 (L) 09/27/2021  ? ALT 7 09/27/2021  ? ALKPHOS 56 09/27/2021  ? BILITOT 0.8 09/27/2021  ? GFRNONAA 54 (L) 09/27/2021  ? GFRAA 54 (L) 03/08/2020  ? ? ?No results found for: CEA1, CEA, [id number], CA125 ? ?Lab Results  ?Component Value Date  ? INR 0.9 11/09/2018  ? LABPROT 12.5 11/09/2018  ? ? ?Imaging: ? ?No results found. ? ?Medications: I have reviewed the patient's current medications. ? ? ?Assessment/Plan: ?CLL ?Review of the peripheral blood smear is consistent with chronic lymphocytic leukemia ?12/16/2014 Peripheral blood flow cytometry consistent with CLL ?10/16/2018 MRI brain and cervical spine - Prominent bulky cervical adenopathy within the  visualized neck, compatible with history of CLL. ?FISH analysis- gain of 13q; 17p and 11q not detected ?Acalabrutinib 11/22/2018 ?2. thrombocytopenia-likely secondary to chronic lymphocytic leukemia versus "pseudo thrombocytopenia" due to platelet clumping; progressive thrombocytopenia June 2018 ?Trial of prednisone started 10/17/2018 after admission with a fall/subarachnoid hemorrhage-starting dose 60 mg daily ?Prednisone tapered to 40 mg daily beginning 10/22/2018 ?Prednisone taper to 30 mg daily beginning 10/29/2018, tapered to off over 6 days beginning 11/13/2018 ?Bone marrow biopsy 11/09/2018-hypercellular marrow with prominent involvement by CLL, abundant megakaryocytes, lymphocytes represent 82% of all cells, the cytogenetics returned normal ?3. Hospitalization with sepsis/UTI with bacteremia (Enterobacter aerogenes) March 2018 ?4. Hospitalization for syncopal episode resulting in fall. Developed a small right subarachnoid hemorrhage.  ?5. MRI brain 10/16/2018 - Focal 1-2 cm osseous lesions involving the right parietal and ?occipital calvarium as above, indeterminate. Findings are of ?uncertain significance, with no definite corresponding osseous ?lesion seen on prior CT. ?6.  History of idiopathic "anaphylactoid "reactions ?7.  Paroxysmal atrial fibrillation atrial fibrillation noted on a cardiac monitor 2020, not placed on anticoagulation secondary to thrombocytopenia ?  ?  ? ?Disposition: ?[name] appears stable.  He is in clinical remission from CLL.  He will continue acalabrutinib at the current dose.  He will return for an office and lab visit in 6 months.  He will contact us in the interim for new symptoms.  He will remain up-to-date on the influenza,  COVID-19, and pneumonia vaccines. ? ?[name] Coder, MD ? ?09/27/2021  ?11:05 AM ? ? ?

## 2021-09-30 NOTE — Progress Notes (Signed)
Carelink Summary Report / Loop Recorder 

## 2021-10-05 ENCOUNTER — Other Ambulatory Visit (HOSPITAL_COMMUNITY): Payer: Self-pay

## 2021-10-12 ENCOUNTER — Other Ambulatory Visit (HOSPITAL_COMMUNITY): Payer: Self-pay

## 2021-10-19 ENCOUNTER — Ambulatory Visit: Payer: Medicare PPO | Attending: Internal Medicine

## 2021-10-19 DIAGNOSIS — Z23 Encounter for immunization: Secondary | ICD-10-CM

## 2021-10-20 ENCOUNTER — Other Ambulatory Visit (HOSPITAL_BASED_OUTPATIENT_CLINIC_OR_DEPARTMENT_OTHER): Payer: Self-pay

## 2021-10-20 MED ORDER — PFIZER COVID-19 VAC BIVALENT 30 MCG/0.3ML IM SUSP
INTRAMUSCULAR | 0 refills | Status: DC
  Filled 2021-10-20: qty 0.3, 1d supply, fill #0

## 2021-10-20 NOTE — Progress Notes (Signed)
? ?  Covid-19 Vaccination [facility] ? ?Name:  [name]    ?MRN: [id number] ?DOB: September 03, 1940 ? ?10/20/2021 ? ?[name] was observed post Covid-19 immunization for 15 minutes without incident. He was provided with Vaccine Information Sheet and instruction to access the V-Safe system.  ? ?[name] was instructed to call 911 with any severe reactions post vaccine: ?Difficulty breathing  ?Swelling of face and throat  ?A fast heartbeat  ?A bad rash all over body  ?Dizziness and weakness  ? ? ? ?

## 2021-10-25 ENCOUNTER — Ambulatory Visit (INDEPENDENT_AMBULATORY_CARE_PROVIDER_SITE_OTHER): Payer: Medicare PPO

## 2021-10-25 DIAGNOSIS — R55 Syncope and collapse: Secondary | ICD-10-CM

## 2021-10-25 LAB — CUP PACEART REMOTE DEVICE CHECK
Date Time Interrogation Session: 20230507231744
Implantable Pulse Generator Implant Date: 20200430

## 2021-10-30 DIAGNOSIS — M7918 Myalgia, other site: Secondary | ICD-10-CM | POA: Diagnosis not present

## 2021-10-30 DIAGNOSIS — M25522 Pain in left elbow: Secondary | ICD-10-CM | POA: Diagnosis not present

## 2021-11-05 ENCOUNTER — Other Ambulatory Visit (HOSPITAL_COMMUNITY): Payer: Self-pay

## 2021-11-05 ENCOUNTER — Other Ambulatory Visit: Payer: Self-pay | Admitting: Oncology

## 2021-11-05 DIAGNOSIS — C9110 Chronic lymphocytic leukemia of B-cell type not having achieved remission: Secondary | ICD-10-CM

## 2021-11-08 ENCOUNTER — Other Ambulatory Visit (HOSPITAL_COMMUNITY): Payer: Self-pay

## 2021-11-08 MED ORDER — CALQUENCE 100 MG PO TABS
100.0000 mg | ORAL_TABLET | Freq: Every day | ORAL | 4 refills | Status: DC
  Filled 2021-11-08: qty 30, 30d supply, fill #0
  Filled 2021-11-30: qty 30, 30d supply, fill #1
  Filled 2022-01-04: qty 30, 30d supply, fill #2
  Filled 2022-02-01: qty 30, 30d supply, fill #3
  Filled 2022-02-28: qty 30, 30d supply, fill #4

## 2021-11-09 NOTE — Progress Notes (Signed)
Carelink Summary Report / Loop Recorder 

## 2021-11-11 ENCOUNTER — Other Ambulatory Visit (HOSPITAL_COMMUNITY): Payer: Self-pay

## 2021-11-12 ENCOUNTER — Other Ambulatory Visit (HOSPITAL_COMMUNITY): Payer: Self-pay

## 2021-11-29 ENCOUNTER — Ambulatory Visit (INDEPENDENT_AMBULATORY_CARE_PROVIDER_SITE_OTHER): Payer: Medicare PPO

## 2021-11-29 DIAGNOSIS — I2510 Atherosclerotic heart disease of native coronary artery without angina pectoris: Secondary | ICD-10-CM

## 2021-11-30 ENCOUNTER — Other Ambulatory Visit (HOSPITAL_COMMUNITY): Payer: Self-pay

## 2021-12-01 LAB — CUP PACEART REMOTE DEVICE CHECK
Date Time Interrogation Session: 20230609231356
Implantable Pulse Generator Implant Date: 20200430

## 2021-12-14 ENCOUNTER — Other Ambulatory Visit (HOSPITAL_COMMUNITY): Payer: Self-pay

## 2021-12-22 NOTE — Progress Notes (Signed)
Carelink Summary Report / Loop Recorder 

## 2021-12-30 ENCOUNTER — Ambulatory Visit (INDEPENDENT_AMBULATORY_CARE_PROVIDER_SITE_OTHER): Payer: Medicare PPO

## 2021-12-30 DIAGNOSIS — L249 Irritant contact dermatitis, unspecified cause: Secondary | ICD-10-CM | POA: Diagnosis not present

## 2021-12-30 DIAGNOSIS — L821 Other seborrheic keratosis: Secondary | ICD-10-CM | POA: Diagnosis not present

## 2021-12-30 DIAGNOSIS — L570 Actinic keratosis: Secondary | ICD-10-CM | POA: Diagnosis not present

## 2021-12-30 DIAGNOSIS — Z85828 Personal history of other malignant neoplasm of skin: Secondary | ICD-10-CM | POA: Diagnosis not present

## 2021-12-30 DIAGNOSIS — C4441 Basal cell carcinoma of skin of scalp and neck: Secondary | ICD-10-CM | POA: Diagnosis not present

## 2021-12-30 DIAGNOSIS — L578 Other skin changes due to chronic exposure to nonionizing radiation: Secondary | ICD-10-CM | POA: Diagnosis not present

## 2021-12-30 DIAGNOSIS — D225 Melanocytic nevi of trunk: Secondary | ICD-10-CM | POA: Diagnosis not present

## 2021-12-30 DIAGNOSIS — R55 Syncope and collapse: Secondary | ICD-10-CM

## 2021-12-30 DIAGNOSIS — D2261 Melanocytic nevi of right upper limb, including shoulder: Secondary | ICD-10-CM | POA: Diagnosis not present

## 2021-12-30 DIAGNOSIS — B079 Viral wart, unspecified: Secondary | ICD-10-CM | POA: Diagnosis not present

## 2021-12-30 DIAGNOSIS — D485 Neoplasm of uncertain behavior of skin: Secondary | ICD-10-CM | POA: Diagnosis not present

## 2021-12-30 LAB — CUP PACEART REMOTE DEVICE CHECK
Date Time Interrogation Session: 20230712232029
Implantable Pulse Generator Implant Date: 20200430

## 2022-01-04 ENCOUNTER — Other Ambulatory Visit (HOSPITAL_COMMUNITY): Payer: Self-pay

## 2022-01-10 ENCOUNTER — Other Ambulatory Visit (HOSPITAL_COMMUNITY): Payer: Self-pay

## 2022-01-14 NOTE — Progress Notes (Signed)
Carelink Summary Report / Loop Recorder 

## 2022-01-31 ENCOUNTER — Ambulatory Visit: Payer: Medicare PPO

## 2022-02-01 ENCOUNTER — Ambulatory Visit: Payer: Medicare PPO | Admitting: Physician Assistant

## 2022-02-01 ENCOUNTER — Other Ambulatory Visit (HOSPITAL_COMMUNITY): Payer: Self-pay

## 2022-02-01 ENCOUNTER — Encounter: Payer: Self-pay | Admitting: Physician Assistant

## 2022-02-01 DIAGNOSIS — M1712 Unilateral primary osteoarthritis, left knee: Secondary | ICD-10-CM

## 2022-02-01 DIAGNOSIS — M170 Bilateral primary osteoarthritis of knee: Secondary | ICD-10-CM

## 2022-02-01 LAB — CUP PACEART REMOTE DEVICE CHECK
Date Time Interrogation Session: 20230814232314
Implantable Pulse Generator Implant Date: 20200430

## 2022-02-01 MED ORDER — METHYLPREDNISOLONE ACETATE 40 MG/ML IJ SUSP
80.0000 mg | INTRAMUSCULAR | Status: AC | PRN
  Administered 2022-02-01: 80 mg via INTRA_ARTICULAR

## 2022-02-01 MED ORDER — LIDOCAINE HCL 1 % IJ SOLN
2.0000 mL | INTRAMUSCULAR | Status: AC | PRN
  Administered 2022-02-01: 2 mL

## 2022-02-01 MED ORDER — BUPIVACAINE HCL 0.25 % IJ SOLN
2.0000 mL | INTRAMUSCULAR | Status: AC | PRN
  Administered 2022-02-01: 2 mL via INTRA_ARTICULAR

## 2022-02-01 NOTE — Progress Notes (Signed)
Office Visit Note   Patient: [name]           Date of Birth: 1941-06-14           MRN: [id number] Visit Date: 02/01/2022              Requested by: [name], MD [address] [place],  [place] [address] PCP: [name], MD  Chief Complaint  Patient presents with  . Left Knee - Follow-up      HPI: Patient is a pleasant 81 year old gentleman who is a longtime patient of Dr. Durward Fortes.  He is status post left knee arthroscopy many years ago.  He has had steroid injections many times in the past and is gotten overall good results.  He is walking 2 miles a day.  He is having some pain on the medial side of the left knee.  No new injury.  Is requesting a cortisone injection if possible  Assessment & Plan: Visit Diagnoses:  1. Primary osteoarthritis of both knees     Plan: Patient was injected today.  May follow-up as needed.  We briefly discussed gel injections if this does not work well for him  Follow-Up Instructions: Return if symptoms worsen or fail to improve.   Ortho Exam  Patient is alert, oriented, no adenopathy, well-dressed, normal affect, normal respiratory effort. Examination of his left knee well-healed surgical portals.  No redness no erythema he has tenderness to palpation over the medial joint line no real tenderness over the lateral joint line.  Imaging: CUP PACEART REMOTE DEVICE CHECK  Result Date: 02/01/2022 ILR summary report received. Battery status OK. Normal device function. No new symptom, tachy, brady, or pause episodes. No new AF episodes. Monthly summary reports and ROV/PRN LA  No images are attached to the encounter.  Labs: Lab Results  Component Value Date   HGBA1C 5.4 10/16/2018   LABURIC 6.2 10/31/2018   LABURIC 4.5 08/19/2016   REPTSTATUS 08/21/2016 FINAL 08/16/2016   GRAMSTAIN No WBC Seen 03/02/2013   GRAMSTAIN No Squamous Epithelial Cells Seen 03/02/2013   GRAMSTAIN Few Gram Negative Rods 03/02/2013   CULT   08/16/2016    NO GROWTH 5 DAYS Performed at [facility], [place] [address], [place], [place] [address]    LABORGA ENTEROBACTER AEROGENES (A) 08/16/2016     Lab Results  Component Value Date   ALBUMIN 4.5 09/27/2021   ALBUMIN 4.4 06/29/2021   ALBUMIN 4.3 04/13/2021    Lab Results  Component Value Date   MG 2.1 08/19/2016   MG 1.7 08/18/2016   No results found for: "VD25OH"  No results found for: "PREALBUMIN"    Latest Ref Rng & Units 09/27/2021   10:10 AM 06/29/2021    9:00 AM 04/13/2021    9:01 AM  CBC EXTENDED  WBC 4.0 - 10.5 K/uL 10.5  8.6  8.9   RBC 4.22 - 5.81 MIL/uL 4.72  5.14  4.84   Hemoglobin 13.0 - 17.0 g/dL 14.4  15.7  14.9   HCT 39.0 - 52.0 % 44.2  48.4  45.7   Platelets 150 - 400 K/uL 165  127  132   NEUT# 1.7 - 7.7 K/uL 7.2  5.4  5.2   Lymph# 0.7 - 4.0 K/uL 2.4  2.3  2.7      There is no height or weight on file to calculate BMI.  Orders:  No orders of the defined types were placed in this encounter.  No  orders of the defined types were placed in this encounter.    Procedures: Large Joint Inj: L knee on 02/01/2022 10:19 AM Indications: pain and diagnostic evaluation Details: 25 G 1.5 in needle, anteromedial approach  Arthrogram: No  Medications: 80 mg methylPREDNISolone acetate 40 MG/ML; 2 mL lidocaine 1 %; 2 mL bupivacaine 0.25 % Outcome: tolerated well, no immediate complications Procedure, treatment alternatives, risks and benefits explained, specific risks discussed. Consent was given by the patient.    Clinical Data: No additional findings.  ROS:  All other systems negative, except as noted in the HPI. Review of Systems  Objective: Vital Signs: There were no vitals taken for this visit.  Specialty Comments:  No specialty comments available.  PMFS History: Patient Active Problem List   Diagnosis Date Noted  . Paroxysmal atrial fibrillation ([employer]) 03/23/2021  . Unilateral primary osteoarthritis, right knee 09/16/2020  .  Acute abdomen 08/24/2020  . Allergy 08/24/2020  . Contact dermatitis 08/24/2020  . Microscopic hematuria 08/24/2020  . Benign paroxysmal positional vertigo 08/24/2020  . Benign prostatic hyperplasia with lower urinary tract symptoms 08/24/2020  . Bitten or stung by nonvenomous insect and other nonvenomous arthropods, initial encounter 08/24/2020  . COVID-19 virus IgG antibody test result unknown 08/24/2020  . ED (erectile dysfunction) of organic origin 08/24/2020  . Elevated PSA 08/24/2020  . Fatigue 08/24/2020  . Gastro-esophageal reflux disease without esophagitis 08/24/2020  . Hepatitis A vaccination not up to date 08/24/2020  . History of adenomatous polyp of colon 08/24/2020  . Inflamed seborrheic keratosis 08/24/2020  . Insomnia 08/24/2020  . Low back pain 08/24/2020  . Migraine without aura, not refractory 08/24/2020  . 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 Nocturia 08/24/2020  . Osteoarthritis of knee 08/24/2020  . Other long term (current) drug therapy 08/24/2020  . Pain in right ankle and joints of right foot 08/24/2020  . Paresthesia of both hands 08/24/2020  . Prediabetes 08/24/2020  . Acute bronchitis 08/24/2020  . Tendinitis 08/24/2020  . Skin sensation disturbance 08/24/2020  . Sleep disorder 08/24/2020  . Subarachnoid hemorrhage following injury without open intracranial wound and with prolonged loss of consciousness (more than 24 hours) and return to pre-existing conscious level ([place]) 08/24/2020  . Tinea cruris 08/24/2020  . Vitamin D deficiency 08/24/2020  . Essential hypertension 08/24/2020  . Goals of care, counseling/discussion 11/13/2018  . Coronary artery disease involving native coronary artery of native heart without angina pectoris 11/01/2018  . Facial laceration 10/23/2018  . Elevated troponin 10/16/2018  . Mixed hyperlipidemia 10/15/2018  . Syncope 10/15/2018  . Nasal bone fracture 10/15/2018  . Chest tightness  10/15/2018  . SAH (subarachnoid hemorrhage) ([place]) 10/15/2018  . Thrombocytopenia ([place]) 10/15/2018  . Fall 10/15/2018  . Carpal tunnel syndrome, bilateral 10/09/2017  . Right foot pain   . Enterobacter sepsis ([place]) 08/19/2016  . Bacteremia due to Enterobacter species 08/19/2016  . Acute encephalopathy 08/17/2016  . Hyperglycemia   . Urinary tract infection with hematuria   . Hypokalemia 08/16/2016  . CLL (chronic lymphocytic leukemia) ([place]) 12/16/2014  . Dyspnea 09/20/2012   Past Medical History:  Diagnosis Date  . Acute encephalopathy 08/17/2016  . Acute renal failure superimposed on stage 3 chronic kidney disease ([place]) 10/15/2018  . AKI (acute kidney injury) ([place]) 08/16/2016  . Arthritis   . Bacteremia due to Enterobacter species 08/19/2016  . Carpal tunnel syndrome, bilateral 10/09/2017  . Chest tightness 10/15/2018  . CLL (chronic lymphocytic leukemia) ([place]) 12/16/2014  . Dyspnea 09/20/2012  .  Elevated troponin 10/16/2018  . Enterobacter sepsis ([place]) 08/19/2016  . Fall 10/15/2018  . GERD (gastroesophageal reflux disease)   . Hepatitis 1966   Drug reaction after taking medication  . Hyperglycemia   . Hyperlipemia   . Hypertension   . Hypokalemia 08/16/2016  . Lung nodule seen on imaging study    bilateral lungs  . Nasal bone fracture 10/15/2018  . Right foot pain   . SAH (subarachnoid hemorrhage) ([place]) 10/15/2018  . Syncope 10/15/2018  . Thrombocytopenia ([place]) 10/15/2018  . Urinary tract infection with hematuria     Family History  Problem Relation Age of Onset  . Sudden death Mother 86       possible botulism  . Dementia Father   . Seizures Sister     Past Surgical History:  Procedure Laterality Date  . APPENDECTOMY    . arthroscoyp  2000   left knee  . COLONOSCOPY W/ POLYPECTOMY    . LOOP RECORDER INSERTION N/A 10/18/2018   Procedure: LOOP RECORDER INSERTION;  Surgeon: [name], MD;  Location: [facility];  Service: Cardiovascular;  Laterality: N/A;  .  LUMBAR LAMINECTOMY/DECOMPRESSION MICRODISCECTOMY  06/27/2012   Procedure: LUMBAR LAMINECTOMY/DECOMPRESSION MICRODISCECTOMY 1 LEVEL;  Surgeon: [name], MD;  Location: [facility];  Service: Neurosurgery;  Laterality: Bilateral;  bilateral four-five laminectony  . SKIN CANCER EXCISION  5-6 years ago   moe-s surgery- basal b  . TONSILLECTOMY     Social History   Occupational History  . Occupation: Retired  Tobacco Use  . Smoking status: Never  . Smokeless tobacco: Never  Vaping Use  . Vaping Use: Never used  Substance and Sexual Activity  . Alcohol use: Yes    Alcohol/week: 2.0 standard drinks of alcohol    Types: 2 Cans of beer per week    Comment: rare 2 beers per month  . Drug use: No  . Sexual activity: Not on file

## 2022-02-03 DIAGNOSIS — C4441 Basal cell carcinoma of skin of scalp and neck: Secondary | ICD-10-CM | POA: Diagnosis not present

## 2022-02-09 ENCOUNTER — Other Ambulatory Visit (HOSPITAL_COMMUNITY): Payer: Self-pay

## 2022-02-15 DIAGNOSIS — S76301A Unspecified injury of muscle, fascia and tendon of the posterior muscle group at thigh level, right thigh, initial encounter: Secondary | ICD-10-CM | POA: Diagnosis not present

## 2022-02-15 DIAGNOSIS — M25561 Pain in right knee: Secondary | ICD-10-CM | POA: Diagnosis not present

## 2022-02-28 ENCOUNTER — Other Ambulatory Visit (HOSPITAL_COMMUNITY): Payer: Self-pay

## 2022-03-01 ENCOUNTER — Other Ambulatory Visit (HOSPITAL_BASED_OUTPATIENT_CLINIC_OR_DEPARTMENT_OTHER): Payer: Self-pay

## 2022-03-01 MED ORDER — INFLUENZA VAC A&B SA ADJ QUAD 0.5 ML IM PRSY
PREFILLED_SYRINGE | INTRAMUSCULAR | 0 refills | Status: DC
  Filled 2022-03-01: qty 0.5, 1d supply, fill #0

## 2022-03-01 MED ORDER — AREXVY 120 MCG/0.5ML IM SUSR
INTRAMUSCULAR | 0 refills | Status: DC
  Filled 2022-03-01: qty 0.5, 1d supply, fill #0

## 2022-03-02 ENCOUNTER — Other Ambulatory Visit (HOSPITAL_BASED_OUTPATIENT_CLINIC_OR_DEPARTMENT_OTHER): Payer: Self-pay

## 2022-03-03 ENCOUNTER — Ambulatory Visit (INDEPENDENT_AMBULATORY_CARE_PROVIDER_SITE_OTHER): Payer: Medicare PPO

## 2022-03-03 DIAGNOSIS — R55 Syncope and collapse: Secondary | ICD-10-CM | POA: Diagnosis not present

## 2022-03-07 LAB — CUP PACEART REMOTE DEVICE CHECK
Date Time Interrogation Session: 20230916232616
Implantable Pulse Generator Implant Date: 20200430

## 2022-03-09 ENCOUNTER — Other Ambulatory Visit (HOSPITAL_BASED_OUTPATIENT_CLINIC_OR_DEPARTMENT_OTHER): Payer: Self-pay

## 2022-03-10 ENCOUNTER — Other Ambulatory Visit (HOSPITAL_COMMUNITY): Payer: Self-pay

## 2022-03-10 ENCOUNTER — Other Ambulatory Visit (HOSPITAL_BASED_OUTPATIENT_CLINIC_OR_DEPARTMENT_OTHER): Payer: Self-pay

## 2022-03-11 ENCOUNTER — Other Ambulatory Visit (HOSPITAL_BASED_OUTPATIENT_CLINIC_OR_DEPARTMENT_OTHER): Payer: Self-pay

## 2022-03-11 DIAGNOSIS — M25561 Pain in right knee: Secondary | ICD-10-CM | POA: Diagnosis not present

## 2022-03-11 DIAGNOSIS — M179 Osteoarthritis of knee, unspecified: Secondary | ICD-10-CM | POA: Diagnosis not present

## 2022-03-11 DIAGNOSIS — R0609 Other forms of dyspnea: Secondary | ICD-10-CM | POA: Diagnosis not present

## 2022-03-15 DIAGNOSIS — R030 Elevated blood-pressure reading, without diagnosis of hypertension: Secondary | ICD-10-CM | POA: Diagnosis not present

## 2022-03-15 DIAGNOSIS — R0609 Other forms of dyspnea: Secondary | ICD-10-CM | POA: Diagnosis not present

## 2022-03-16 NOTE — Progress Notes (Signed)
Carelink Summary Report / Loop Recorder 

## 2022-03-28 ENCOUNTER — Other Ambulatory Visit (HOSPITAL_COMMUNITY): Payer: Self-pay

## 2022-03-29 ENCOUNTER — Encounter: Payer: Self-pay | Admitting: *Deleted

## 2022-03-29 ENCOUNTER — Inpatient Hospital Stay: Payer: Medicare PPO | Attending: Oncology

## 2022-03-29 ENCOUNTER — Inpatient Hospital Stay: Payer: Medicare PPO | Admitting: Oncology

## 2022-03-29 ENCOUNTER — Other Ambulatory Visit (HOSPITAL_COMMUNITY): Payer: Self-pay

## 2022-03-29 VITALS — BP 146/73 | HR 70 | Temp 98.2°F | Resp 18 | Ht 63.0 in | Wt 162.0 lb

## 2022-03-29 DIAGNOSIS — C9110 Chronic lymphocytic leukemia of B-cell type not having achieved remission: Secondary | ICD-10-CM | POA: Insufficient documentation

## 2022-03-29 DIAGNOSIS — Z79899 Other long term (current) drug therapy: Secondary | ICD-10-CM | POA: Insufficient documentation

## 2022-03-29 DIAGNOSIS — D696 Thrombocytopenia, unspecified: Secondary | ICD-10-CM | POA: Insufficient documentation

## 2022-03-29 LAB — CMP (CANCER CENTER ONLY)
ALT: 11 U/L (ref 0–44)
AST: 13 U/L — ABNORMAL LOW (ref 15–41)
Albumin: 4.2 g/dL (ref 3.5–5.0)
Alkaline Phosphatase: 56 U/L (ref 38–126)
Anion gap: 7 (ref 5–15)
BUN: 25 mg/dL — ABNORMAL HIGH (ref 8–23)
CO2: 26 mmol/L (ref 22–32)
Calcium: 9 mg/dL (ref 8.9–10.3)
Chloride: 107 mmol/L (ref 98–111)
Creatinine: 1.32 mg/dL — ABNORMAL HIGH (ref 0.61–1.24)
GFR, Estimated: 55 mL/min — ABNORMAL LOW (ref >60)
Glucose, Bld: 96 mg/dL (ref 70–99)
Potassium: 4.2 mmol/L (ref 3.5–5.1)
Sodium: 140 mmol/L (ref 135–145)
Total Bilirubin: 0.6 mg/dL (ref 0.3–1.2)
Total Protein: 6.7 g/dL (ref 6.5–8.1)

## 2022-03-29 LAB — CBC WITH DIFFERENTIAL (CANCER CENTER ONLY)
Abs Immature Granulocytes: 0.03 K/uL (ref 0.00–0.07)
Basophils Absolute: 0.1 K/uL (ref 0.0–0.1)
Basophils Relative: 1 %
Eosinophils Absolute: 0.2 K/uL (ref 0.0–0.5)
Eosinophils Relative: 2 %
HCT: 41.2 % (ref 39.0–52.0)
Hemoglobin: 13.7 g/dL (ref 13.0–17.0)
Immature Granulocytes: 0 %
Lymphocytes Relative: 23 %
Lymphs Abs: 2.2 K/uL (ref 0.7–4.0)
MCH: 31.2 pg (ref 26.0–34.0)
MCHC: 33.3 g/dL (ref 30.0–36.0)
MCV: 93.8 fL (ref 80.0–100.0)
Monocytes Absolute: 0.6 K/uL (ref 0.1–1.0)
Monocytes Relative: 6 %
Neutro Abs: 6.8 K/uL (ref 1.7–7.7)
Neutrophils Relative %: 68 %
Platelet Count: 128 K/uL — ABNORMAL LOW (ref 150–400)
RBC: 4.39 MIL/uL (ref 4.22–5.81)
RDW: 12.6 % (ref 11.5–15.5)
WBC Count: 9.9 K/uL (ref 4.0–10.5)
nRBC: 0 % (ref 0.0–0.2)

## 2022-03-29 NOTE — Progress Notes (Signed)
  [facility] OFFICE PROGRESS NOTE   Diagnosis: CLL  INTERVAL HISTORY:    [name] returns as scheduled.  He feels well.  He had recent bruise.  No other bleeding.  No fever.  No syncope events.  No arthralgias or rash.  Objective:  Vital signs in last 24 hours:  Blood pressure (!) 146/73, pulse 70, temperature 98.2 F (36.8 C), temperature source Oral, resp. rate 18, height _0  (1.6 m), weight 162 lb (73.5 kg), SpO2 98 %.    HEENT: No thrush or ulcers Lymphatics: No cervical, supraclavicular, axillary, or inguinal nodes Resp: Lungs clear bilaterally Cardio: Regular rate and rhythm GI: No hepatosplenomegaly Vascular: No leg edema, left lower leg is slightly larger than the right side    Lab Results:  Lab Results  Component Value Date   WBC 9.9 03/29/2022   HGB 13.7 03/29/2022   HCT 41.2 03/29/2022   MCV 93.8 03/29/2022   PLT 128 (L) 03/29/2022   NEUTROABS 6.8 03/29/2022    CMP  Lab Results  Component Value Date   NA 140 03/29/2022   K 4.2 03/29/2022   CL 107 03/29/2022   CO2 26 03/29/2022   GLUCOSE 96 03/29/2022   BUN 25 (H) 03/29/2022   CREATININE 1.32 (H) 03/29/2022   CALCIUM 9.0 03/29/2022   PROT 6.7 03/29/2022   ALBUMIN 4.2 03/29/2022   AST 13 (L) 03/29/2022   ALT 11 03/29/2022   ALKPHOS 56 03/29/2022   BILITOT 0.6 03/29/2022   GFRNONAA 55 (L) 03/29/2022   GFRAA 54 (L) 03/08/2020    Medications: I have reviewed the patient's current medications.   Assessment/Plan: CLL Review of the peripheral blood smear is consistent with chronic lymphocytic leukemia 12/16/2014 Peripheral blood flow cytometry consistent with CLL 10/16/2018 MRI brain and cervical spine - Prominent bulky cervical adenopathy within the visualized neck, compatible with history of CLL. FISH analysis- gain of 13q; 17p and 11q not detected Acalabrutinib 11/22/2018 2. thrombocytopenia-likely secondary to chronic lymphocytic leukemia versus "pseudo thrombocytopenia" due  to platelet clumping; progressive thrombocytopenia June 2018 Trial of prednisone started 10/17/2018 after admission with a fall/subarachnoid hemorrhage-starting dose 60 mg daily Prednisone tapered to 40 mg daily beginning 10/22/2018 Prednisone taper to 30 mg daily beginning 10/29/2018, tapered to off over 6 days beginning 11/13/2018 Bone marrow biopsy 11/09/2018-hypercellular marrow with prominent involvement by CLL, abundant megakaryocytes, lymphocytes represent 82% of all cells, the cytogenetics returned normal 3. Hospitalization with sepsis/UTI with bacteremia (Enterobacter aerogenes) March 2018 4. Hospitalization for syncopal episode resulting in fall. Developed a small right subarachnoid hemorrhage.  5. MRI brain 10/16/2018 - Focal 1-2 cm osseous lesions involving the right parietal and occipital calvarium as above, indeterminate. Findings are of uncertain significance, with no definite corresponding osseous lesion seen on prior CT. 6.  History of idiopathic "anaphylactoid "reactions 7.  Paroxysmal atrial fibrillation atrial fibrillation noted on a cardiac monitor 2020, not placed on anticoagulation secondary to thrombocytopenia        Disposition: [name] is stable from a hematologic standpoint.  He has chronic mild thrombocytopenia.  He will continue acalabrutinib.  He will return for a lab visit in 3 months and an office visit in 6 months.  We will be sure he is up-to-date on the pneumococcal vaccine.  Betsy Coder, MD  03/29/2022  11:15 AM

## 2022-03-30 ENCOUNTER — Other Ambulatory Visit (HOSPITAL_COMMUNITY): Payer: Self-pay

## 2022-03-30 ENCOUNTER — Other Ambulatory Visit: Payer: Self-pay | Admitting: Oncology

## 2022-03-30 DIAGNOSIS — C9110 Chronic lymphocytic leukemia of B-cell type not having achieved remission: Secondary | ICD-10-CM

## 2022-03-30 MED ORDER — CALQUENCE 100 MG PO TABS
100.0000 mg | ORAL_TABLET | Freq: Every day | ORAL | 4 refills | Status: DC
  Filled 2022-03-30: qty 30, 30d supply, fill #0
  Filled 2022-05-06: qty 30, 30d supply, fill #1
  Filled 2022-06-01: qty 30, 30d supply, fill #2
  Filled 2022-07-08: qty 30, 30d supply, fill #3
  Filled 2022-08-01: qty 30, 30d supply, fill #4

## 2022-04-04 ENCOUNTER — Encounter: Payer: Self-pay | Admitting: Cardiology

## 2022-04-04 ENCOUNTER — Ambulatory Visit (INDEPENDENT_AMBULATORY_CARE_PROVIDER_SITE_OTHER): Payer: Medicare PPO

## 2022-04-04 ENCOUNTER — Ambulatory Visit: Payer: Medicare PPO | Attending: Cardiology | Admitting: Cardiology

## 2022-04-04 VITALS — BP 130/72 | HR 59 | Ht 63.0 in | Wt 160.0 lb

## 2022-04-04 DIAGNOSIS — S066X5D Traumatic subarachnoid hemorrhage with loss of consciousness greater than 24 hours with return to pre-existing conscious level, subsequent encounter: Secondary | ICD-10-CM | POA: Diagnosis not present

## 2022-04-04 DIAGNOSIS — R55 Syncope and collapse: Secondary | ICD-10-CM

## 2022-04-04 DIAGNOSIS — I2510 Atherosclerotic heart disease of native coronary artery without angina pectoris: Secondary | ICD-10-CM

## 2022-04-04 DIAGNOSIS — E782 Mixed hyperlipidemia: Secondary | ICD-10-CM

## 2022-04-04 DIAGNOSIS — I480 Paroxysmal atrial fibrillation: Secondary | ICD-10-CM | POA: Diagnosis not present

## 2022-04-04 DIAGNOSIS — Z8679 Personal history of other diseases of the circulatory system: Secondary | ICD-10-CM | POA: Diagnosis not present

## 2022-04-04 NOTE — Progress Notes (Signed)
Cardiology Office Note:    Date:  04/04/2022   ID:  [name], DOB [birth year], MRN [id number]  PCP:  [name], MD   [place]  Cardiologist:  Candee Furbish, MD  Advanced Practice Provider:  No care team member to display Electrophysiologist:  Will [name], MD      Referring MD: [name], MD    History of Present Illness:    [name] is a 81 y.o. male here for the follow-up of coronary artery disease, hypertension, and hyperlipidemia. At his last follow-up, his labs showed Hemoglobin A1c 5.4. Hemoglobin 15.3, creatinine 1.4. Most recently, his LDL is 63.  Previously had a small right subarachnoid hemorrhage after syncopal episode resulting in fall.  Echocardiogram in 2020 showed normal pump function, hyperdynamic with minimal gradient.  Had not been on anticoagulation.  Thankfully has not demonstrated any further episodes of paroxysmal atrial fibrillation.  He has a loop recorder in place monitoring since May 2020, no atrial fibrillation noted.  Previously was enjoying yoga once a week.  He had some concerns that he felt mildly winded when walking.  Felt some mild pressure when walking.  Overall was doing very well without any worsening symptoms.  Walked about 2-1/2 miles a day.  His CLL has been stable.  Immunocompromised.  [name] is his wife.  English major.  His sisters' husband is having a stent placed in [name], [place].  At his last appointment he denied feeling any episodes of atrial fibrillation. Continued walking 2 miles each morning with no exertional symptoms. Had been taking Calquence once a day. No changes made.  Today: Overall he is feeling well. However, he does have concerns for possible shortness of breath. He notices that he is not completing a full breath, as if his lungs are not receiving as much air as they should. Lately he is concerned that this may worsen in the future. For exercise he does walk 32  minutes or a mile every morning, during which he does not need to stop to catch his breath.  He notes that his blood pressure is always best in clinic. Today in clinic his reading is 130/72.  His last known episode of atrial fibrillation was years ago following an argument. We discussed the potential removal of his loop recorder in the near future.  His CLL is in control/stable.   He denies any palpitations, chest pain, or peripheral edema. No lightheadedness, headaches, syncope, orthopnea, or PND.  He would like to see someone at the [facility] facility.  Hopefully will be traveling to [place] soon.   Past Medical History:  Diagnosis Date   Acute encephalopathy 08/17/2016   Acute renal failure superimposed on stage 3 chronic kidney disease ([name]) 10/15/2018   AKI (acute kidney injury) ([facility]) 08/16/2016   Arthritis    Bacteremia due to Enterobacter species 08/19/2016   Carpal tunnel syndrome, bilateral 10/09/2017   Chest tightness 10/15/2018   CLL (chronic lymphocytic leukemia) ([name]) 12/16/2014   Dyspnea 09/20/2012   Elevated troponin 10/16/2018   Enterobacter sepsis ([name]) 08/19/2016   Fall 10/15/2018   GERD (gastroesophageal reflux disease)    Hepatitis 1966   Drug reaction after taking medication   Hyperglycemia    Hyperlipemia    Hypertension    Hypokalemia 08/16/2016   Lung nodule seen on imaging study    bilateral lungs   Nasal bone fracture 10/15/2018   Right foot pain    SAH (subarachnoid hemorrhage) ([name]) 10/15/2018   Syncope  10/15/2018   Thrombocytopenia ([name]) 10/15/2018   Urinary tract infection with hematuria     Past Surgical History:  Procedure Laterality Date   APPENDECTOMY     arthroscoyp  2000   left knee   COLONOSCOPY W/ POLYPECTOMY     LOOP RECORDER INSERTION N/A 10/18/2018   Procedure: LOOP RECORDER INSERTION;  Surgeon: [name], MD;  Location: [facility];  Service: Cardiovascular;  Laterality: N/A;   LUMBAR LAMINECTOMY/DECOMPRESSION  MICRODISCECTOMY  06/27/2012   Procedure: LUMBAR LAMINECTOMY/DECOMPRESSION MICRODISCECTOMY 1 LEVEL;  Surgeon: [name], MD;  Location: [facility];  Service: Neurosurgery;  Laterality: Bilateral;  bilateral four-five laminectony   SKIN CANCER EXCISION  5-6 years ago   moe-s surgery- basal b   TONSILLECTOMY      Current Medications: Current Meds  Medication Sig   acalabrutinib maleate (CALQUENCE) 100 MG tablet Take 1 tablet (100 mg) by mouth daily.   acetaminophen (TYLENOL) 325 MG tablet Take 325 mg by mouth daily as needed for headache (pain).   cholecalciferol (VITAMIN D3) 25 MCG (1000 UNIT) tablet Take 1,000 Units by mouth daily.    EPINEPHrine (EPIPEN) 0.3 mg/0.3 mL DEVI Inject 0.3 mLs (0.3 mg total) into the muscle once.   metoprolol succinate (TOPROL-XL) 25 MG 24 hr tablet TAKE 1 TABLET(25 MG) BY MOUTH DAILY   simvastatin (ZOCOR) 40 MG tablet Take 20 mg by mouth daily.      Allergies:   Ace inhibitors   Social History   Socioeconomic History   Marital status: Married    Spouse name: [name]    Number of children: 2   Years of education: PhD   Highest education level: Not on file  Occupational History   Occupation: Retired  Tobacco Use   Smoking status: Never   Smokeless tobacco: Never  Vaping Use   Vaping Use: Never used  Substance and Sexual Activity   Alcohol use: Yes    Alcohol/week: 2.0 standard drinks of alcohol    Types: 2 Cans of beer per week    Comment: rare 2 beers per month   Drug use: No   Sexual activity: Not on file  Other Topics Concern   Not on file  Social History Narrative   Lives with wife   Caffeine use: 1 cup coffee per day   Diet coke      Right handed    Social Determinants of Health   Financial Resource Strain: Not on file  Food Insecurity: Not on file  Transportation Needs: Not on file  Physical Activity: Not on file  Stress: Not on file  Social Connections: Not on file     Family History: The patient's family history  includes Dementia in his father; Seizures in his sister; Sudden death (age of onset: 67) in his mother.  ROS:   Please see the history of present illness.    All other systems reviewed and are negative.  EKGs/Labs/Other Studies Reviewed:    The following studies were reviewed today:  CTA Head and Neck 03/08/2020: FINDINGS: CT HEAD FINDINGS Brain: There is no mass, hemorrhage or extra-axial collection. Posterior left convexity meningioma measures 11 mm. The size and configuration of the ventricles and extra-axial CSF spaces are normal. There is no acute or chronic infarction. The brain parenchyma is normal.   Skull: The visualized skull base, calvarium and extracranial soft tissues are normal.   Sinuses/Orbits: No fluid levels or advanced mucosal thickening of the visualized paranasal sinuses. No mastoid or middle  ear effusion. The orbits are normal.   CTA NECK FINDINGS SKELETON: There is no bony spinal canal stenosis. No lytic or blastic lesion.   OTHER NECK: Normal pharynx, larynx and major salivary glands. No cervical lymphadenopathy. Unremarkable thyroid gland.   UPPER CHEST: No pneumothorax or pleural effusion. No nodules or masses.   AORTIC ARCH: There is mild calcific atherosclerosis of the aortic arch. There is no aneurysm, dissection or hemodynamically significant stenosis of the visualized portion of the aorta. Conventional 3 vessel aortic branching pattern. The visualized proximal subclavian arteries are widely patent.   RIGHT CAROTID SYSTEM: Normal without aneurysm, dissection or stenosis.   LEFT CAROTID SYSTEM: Normal without aneurysm, dissection or stenosis.   VERTEBRAL ARTERIES: Right dominant configuration. Both origins are clearly patent. There is no dissection, occlusion or flow-limiting stenosis to the skull base (V1-V3 segments).   CTA HEAD FINDINGS POSTERIOR CIRCULATION: --Vertebral arteries: Normal V4 segments.   --Inferior cerebellar  arteries: Normal.   --Basilar artery: Normal.   --Superior cerebellar arteries: Normal.   --Posterior cerebral arteries (PCA): Normal.   ANTERIOR CIRCULATION: --Intracranial internal carotid arteries: Atherosclerotic calcification of the internal carotid arteries at the skull base without hemodynamically significant stenosis.   --Anterior cerebral arteries (ACA): Normal. Hypoplastic left A1 segment, normal variant   --Middle cerebral arteries (MCA): Normal.   VENOUS SINUSES: As permitted by contrast timing, patent.   ANATOMIC VARIANTS: Both P comms are patent.   Review of the MIP images confirms the above findings.   IMPRESSION: 1. No emergent large vessel occlusion or high-grade stenosis of the head or neck. 2. Posterior left convexity meningioma measures 11 mm.   Aortic Atherosclerosis (ICD10-I70.0).  10/17/2018  CT scan of coronary arteries-distal RCA occluded LAD nonsignificant, 0.84 FFR   IMPRESSION: 1. Coronary artery calcium score 63 Agatston units. This places the patient in the 23rd percentile for age and gender, suggesting low risk for future cardiac events.   2. 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 The PDA and PLV fill with contrast, suggesting collaterals.   3.  Mild stenosis proximal LAD.   4. Artifact obscures a portion of OM1 but doubt significant disease.   Echo 2020  1. The left ventricle has hyperdynamic systolic function, with an  ejection fraction of >65%. The cavity size was normal. Left ventricular  diastolic Doppler parameters are consistent with pseudonormal.   2. There is a dynamic mid LV gradient of 25 mmHg.   3. The right ventricle has normal systolc function. There is no increase  in right ventricular wall thickness.   4. No evidence of mitral valve stenosis.   5. No stenosis of the aortic valve.    Implantable loop recorder-normal sinus rhythm  EKG:  EKG is personally reviewed and interpreted. 04/04/2022:  Sinus bradycardia.  Rate 59 bpm. 03/23/2021: Sinus rhythm. Rate 60 bpm. 01/27/2020: sinus bradycardia 54 with no other abnormalities  Recent Labs: 03/29/2022: ALT 11; BUN 25; Creatinine 1.32; Hemoglobin 13.7; Platelet Count 128; Potassium 4.2; Sodium 140   Recent Lipid Panel    Component Value Date/Time   CHOL 106 10/16/2018 0339   TRIG 125 10/16/2018 0339   HDL 20 (L) 10/16/2018 0339   CHOLHDL 5.3 10/16/2018 0339   VLDL 25 10/16/2018 0339   LDLCALC 61 10/16/2018 0339     Risk Assessment/Calculations:      Physical Exam:     VS:  BP 130/72 (BP Location: Left Arm, Patient Position: Sitting, Cuff Size: Normal)   Pulse (!) 59   Ht  $'5\' 3"'s$  (1.6 m)   Wt 160 lb (72.6 kg)   SpO2 98%   BMI 28.34 kg/m     Wt Readings from Last 3 Encounters:  04/04/22 160 lb (72.6 kg)  03/29/22 162 lb (73.5 kg)  09/27/21 164 lb (74.4 kg)     GEN: Well nourished, well developed in no acute distress HEENT: Normal NECK: No JVD; No carotid bruits LYMPHATICS: No lymphadenopathy CARDIAC: RRR, no murmurs, rubs, gallops RESPIRATORY:  Clear to auscultation without rales, wheezing or rhonchi  ABDOMEN: Soft, non-tender, non-distended MUSCULOSKELETAL:  No edema; No deformity  SKIN: Warm and dry NEUROLOGIC:  Alert and oriented x 3 PSYCHIATRIC:  Normal affect   ASSESSMENT:    No diagnosis found.   PLAN:    In order of problems listed above:  Subarachnoid hemorrhage following injury without open intracranial wound and with prolonged loss of consciousness (more than 24 hours) and return to pre-existing conscious level ([place]) Has been stable since 2020.  Syncopal episode resulting in fall.  Small subarachnoid resolved.  No further issue.   Coronary artery disease involving native coronary artery of native heart without angina pectoris Well-controlled.  No aspirin secondary to prior subarachnoid as well as CLL and low platelets at times.  Continue with low-dose Toprol current medical management.  No changes.  Prior  coronary CT reviewed.  Reassurance.  Denies any anginal symptoms.   CLL (chronic lymphocytic leukemia) (HCC) [name] has been monitoring.  Platelet count 128 last check.  Continuing with current medication.  Stable.   Syncope No further episodes since 2020.  Continue with hydration especially with exercise.  Loop recorder has been reassuring.  No episodes of pauses or atrial fibrillation.   Paroxysmal atrial fibrillation (HCC) Implantable loop recorder carefully monitored.  No further evidence of A. fib.  He is not on anticoagulation because of prior subarachnoid hemorrhage after fall.  We discussed potential watchman device if atrial fibrillation were to return.  He has had loop recorder now for a little over 3 years.  Still functioning.  Dr. Curt Bears.   Mixed hyperlipidemia Continue with simvastatin 20 mg a day.  No myalgias.  Last LDL 63.  Hemoglobin A1c 5.4.  Hemoglobin 13.7 creatinine 1.3  Follow-up:  1 year.  He would like to follow-up at the [facility] facility.  Medication Adjustments/Labs and Tests Ordered: Current medicines are reviewed at length with the patient today.  Concerns regarding medicines are outlined above.   No orders of the defined types were placed in this encounter.  No orders of the defined types were placed in this encounter.  There are no Patient Instructions on file for this visit.   I,Mathew Stumpf,acting as a Education administrator for [employer], MD.,have documented all relevant documentation on the behalf of Candee Furbish, MD,as directed by  Candee Furbish, MD while in the presence of Candee Furbish, MD.  I, Madelin Rear, have reviewed all documentation for this visit. The documentation on 04/04/22 for the exam, diagnosis, procedures, and orders are all accurate and complete.   Waynetta Pean  04/04/2022 9:52 AM    [facility]

## 2022-04-04 NOTE — Patient Instructions (Signed)
Medication Instructions:  The current medical regimen is effective;  continue present plan and medications.  *If you need a refill on your cardiac medications before your next appointment, please call your pharmacy*  Follow-Up: At [facility], you and your health needs are our priority.  As part of our continuing mission to provide you with exceptional heart care, we have created designated Provider Care Teams.  These Care Teams include your primary Cardiologist (physician) and Advanced Practice Providers (APPs -  Physician Assistants and Nurse Practitioners) who all work together to provide you with the care you need, when you need it.  We recommend signing up for the patient portal called "MyChart".  Sign up information is provided on this After Visit Summary.  MyChart is used to connect with patients for Virtual Visits (Telemedicine).  Patients are able to view lab/test results, encounter notes, upcoming appointments, etc.  Non-urgent messages can be sent to your provider as well.   To learn more about what you can do with MyChart, go to [email or web address].    Your next appointment:   1 year(s)  The format for your next appointment:   In Person  Provider:   Mark Skains, MD      Important Information About Sugar       

## 2022-04-06 DIAGNOSIS — Z85828 Personal history of other malignant neoplasm of skin: Secondary | ICD-10-CM | POA: Diagnosis not present

## 2022-04-06 DIAGNOSIS — L578 Other skin changes due to chronic exposure to nonionizing radiation: Secondary | ICD-10-CM | POA: Diagnosis not present

## 2022-04-06 DIAGNOSIS — Z5189 Encounter for other specified aftercare: Secondary | ICD-10-CM | POA: Diagnosis not present

## 2022-04-08 LAB — CUP PACEART REMOTE DEVICE CHECK
Date Time Interrogation Session: 20231019232804
Implantable Pulse Generator Implant Date: 20200430

## 2022-04-13 ENCOUNTER — Other Ambulatory Visit (HOSPITAL_COMMUNITY): Payer: Self-pay

## 2022-04-21 NOTE — Progress Notes (Signed)
Carelink Summary Report / Loop Recorder 

## 2022-04-29 DIAGNOSIS — E782 Mixed hyperlipidemia: Secondary | ICD-10-CM | POA: Diagnosis not present

## 2022-04-29 DIAGNOSIS — Z0000 Encounter for general adult medical examination without abnormal findings: Secondary | ICD-10-CM | POA: Diagnosis not present

## 2022-04-29 DIAGNOSIS — R972 Elevated prostate specific antigen [PSA]: Secondary | ICD-10-CM | POA: Diagnosis not present

## 2022-04-29 DIAGNOSIS — I10 Essential (primary) hypertension: Secondary | ICD-10-CM | POA: Diagnosis not present

## 2022-04-29 DIAGNOSIS — E559 Vitamin D deficiency, unspecified: Secondary | ICD-10-CM | POA: Diagnosis not present

## 2022-04-29 DIAGNOSIS — I2510 Atherosclerotic heart disease of native coronary artery without angina pectoris: Secondary | ICD-10-CM | POA: Diagnosis not present

## 2022-04-29 DIAGNOSIS — G479 Sleep disorder, unspecified: Secondary | ICD-10-CM | POA: Diagnosis not present

## 2022-04-29 DIAGNOSIS — K219 Gastro-esophageal reflux disease without esophagitis: Secondary | ICD-10-CM | POA: Diagnosis not present

## 2022-04-29 DIAGNOSIS — R311 Benign essential microscopic hematuria: Secondary | ICD-10-CM | POA: Diagnosis not present

## 2022-04-29 DIAGNOSIS — C9110 Chronic lymphocytic leukemia of B-cell type not having achieved remission: Secondary | ICD-10-CM | POA: Diagnosis not present

## 2022-04-29 DIAGNOSIS — Z1331 Encounter for screening for depression: Secondary | ICD-10-CM | POA: Diagnosis not present

## 2022-05-05 ENCOUNTER — Ambulatory Visit (INDEPENDENT_AMBULATORY_CARE_PROVIDER_SITE_OTHER): Payer: Medicare PPO

## 2022-05-05 DIAGNOSIS — R55 Syncope and collapse: Secondary | ICD-10-CM

## 2022-05-05 LAB — CUP PACEART REMOTE DEVICE CHECK
Date Time Interrogation Session: 20231115231357
Implantable Pulse Generator Implant Date: 20200430

## 2022-05-06 ENCOUNTER — Other Ambulatory Visit (HOSPITAL_COMMUNITY): Payer: Self-pay

## 2022-05-09 ENCOUNTER — Other Ambulatory Visit (HOSPITAL_COMMUNITY): Payer: Self-pay

## 2022-05-25 NOTE — Progress Notes (Signed)
Carelink Summary Report / Loop Recorder 

## 2022-05-30 ENCOUNTER — Telehealth: Payer: Self-pay | Admitting: *Deleted

## 2022-05-30 NOTE — Telephone Encounter (Signed)
Patient called to inquire what is his blood type. Per transfusion ticket 09/2018, he is O+

## 2022-06-01 ENCOUNTER — Other Ambulatory Visit (HOSPITAL_COMMUNITY): Payer: Self-pay

## 2022-06-03 ENCOUNTER — Other Ambulatory Visit: Payer: Self-pay

## 2022-06-06 ENCOUNTER — Ambulatory Visit (INDEPENDENT_AMBULATORY_CARE_PROVIDER_SITE_OTHER): Payer: Medicare PPO

## 2022-06-06 DIAGNOSIS — R55 Syncope and collapse: Secondary | ICD-10-CM

## 2022-06-07 LAB — CUP PACEART REMOTE DEVICE CHECK
Date Time Interrogation Session: 20231217232408
Implantable Pulse Generator Implant Date: 20200430

## 2022-06-09 DIAGNOSIS — L570 Actinic keratosis: Secondary | ICD-10-CM | POA: Diagnosis not present

## 2022-06-09 DIAGNOSIS — L309 Dermatitis, unspecified: Secondary | ICD-10-CM | POA: Diagnosis not present

## 2022-06-14 ENCOUNTER — Other Ambulatory Visit (HOSPITAL_COMMUNITY): Payer: Self-pay

## 2022-06-23 ENCOUNTER — Telehealth: Payer: Self-pay | Admitting: *Deleted

## 2022-06-23 NOTE — Telephone Encounter (Addendum)
Asking if OK to take CBD for his knee pain while on acalabrutinib?  Per oral oncology pharmacy: One resource said no interaction, the other said that "Cannabidiol might increase levels of acalabrutinib"

## 2022-06-23 NOTE — Telephone Encounter (Signed)
Patient made aware of pharmacist's research and that [name] prefers he not take the CBD.

## 2022-06-29 ENCOUNTER — Encounter: Payer: Self-pay | Admitting: Oncology

## 2022-06-29 ENCOUNTER — Inpatient Hospital Stay: Payer: Medicare PPO | Attending: Oncology

## 2022-06-29 ENCOUNTER — Other Ambulatory Visit: Payer: Self-pay | Admitting: *Deleted

## 2022-06-29 DIAGNOSIS — C9110 Chronic lymphocytic leukemia of B-cell type not having achieved remission: Secondary | ICD-10-CM

## 2022-06-29 LAB — CBC WITH DIFFERENTIAL (CANCER CENTER ONLY)
Abs Immature Granulocytes: 0.02 10*3/uL (ref 0.00–0.07)
Basophils Absolute: 0.1 10*3/uL (ref 0.0–0.1)
Basophils Relative: 1 %
Eosinophils Absolute: 0.2 10*3/uL (ref 0.0–0.5)
Eosinophils Relative: 2 %
HCT: 45.8 % (ref 39.0–52.0)
Hemoglobin: 15.1 g/dL (ref 13.0–17.0)
Immature Granulocytes: 0 %
Lymphocytes Relative: 21 %
Lymphs Abs: 2 10*3/uL (ref 0.7–4.0)
MCH: 31.1 pg (ref 26.0–34.0)
MCHC: 33 g/dL (ref 30.0–36.0)
MCV: 94.2 fL (ref 80.0–100.0)
Monocytes Absolute: 0.6 10*3/uL (ref 0.1–1.0)
Monocytes Relative: 7 %
Neutro Abs: 6.4 10*3/uL (ref 1.7–7.7)
Neutrophils Relative %: 69 %
Platelet Count: 106 10*3/uL — ABNORMAL LOW (ref 150–400)
RBC: 4.86 MIL/uL (ref 4.22–5.81)
RDW: 12 % (ref 11.5–15.5)
WBC Count: 9.3 10*3/uL (ref 4.0–10.5)
nRBC: 0 % (ref 0.0–0.2)

## 2022-07-01 ENCOUNTER — Encounter: Payer: Self-pay | Admitting: Oncology

## 2022-07-07 ENCOUNTER — Ambulatory Visit: Payer: Medicare PPO | Attending: Cardiology

## 2022-07-07 DIAGNOSIS — R55 Syncope and collapse: Secondary | ICD-10-CM | POA: Diagnosis not present

## 2022-07-08 ENCOUNTER — Other Ambulatory Visit (HOSPITAL_COMMUNITY): Payer: Self-pay

## 2022-07-11 ENCOUNTER — Other Ambulatory Visit (HOSPITAL_COMMUNITY): Payer: Self-pay

## 2022-07-11 LAB — CUP PACEART REMOTE DEVICE CHECK
Date Time Interrogation Session: 20240119233046
Implantable Pulse Generator Implant Date: 20200430

## 2022-07-11 NOTE — Progress Notes (Signed)
Carelink Summary Report / Loop Recorder

## 2022-07-21 ENCOUNTER — Telehealth: Payer: Self-pay

## 2022-07-21 NOTE — Telephone Encounter (Signed)
ILR @ RRT   ILR reached RRT:  Patient called, discussed options to leave device in or explanted.  Patient would like to schedule appt to have loop removed. Appt scheduled and sent to precert.  Marked "I" in Paceart: done Enter note in Paceart: done Canceled future remotes: done Discontinued from website: done Entered in Speciality Comments: done  Advised if further questions arise to please call the device clinic at [phone].

## 2022-07-21 NOTE — Telephone Encounter (Signed)
ILR alert report received. Battery status show the device has reached the replacement indicator, sent to triage. Normal device function. No new symptom, tachy, brady, or pause episodes. No new AF episodes. Monthly summary reports and ROV/PRN  Kathy Breach, RN, CCDS, [employer]

## 2022-07-25 NOTE — Progress Notes (Signed)
Carelink Summary Report / Loop Recorder 

## 2022-07-27 ENCOUNTER — Inpatient Hospital Stay: Payer: Medicare PPO | Admitting: Oncology

## 2022-07-27 ENCOUNTER — Inpatient Hospital Stay: Payer: Medicare PPO | Attending: Oncology

## 2022-07-27 ENCOUNTER — Encounter: Payer: Self-pay | Admitting: Oncology

## 2022-07-27 VITALS — BP 155/79 | HR 60 | Temp 98.1°F | Resp 20 | Ht 63.0 in | Wt 165.8 lb

## 2022-07-27 DIAGNOSIS — D696 Thrombocytopenia, unspecified: Secondary | ICD-10-CM | POA: Diagnosis not present

## 2022-07-27 DIAGNOSIS — C9110 Chronic lymphocytic leukemia of B-cell type not having achieved remission: Secondary | ICD-10-CM

## 2022-07-27 LAB — CBC WITH DIFFERENTIAL (CANCER CENTER ONLY)
Abs Immature Granulocytes: 0.03 10*3/uL (ref 0.00–0.07)
Basophils Absolute: 0.1 10*3/uL (ref 0.0–0.1)
Basophils Relative: 1 %
Eosinophils Absolute: 0.3 10*3/uL (ref 0.0–0.5)
Eosinophils Relative: 2 %
HCT: 47 % (ref 39.0–52.0)
Hemoglobin: 15.5 g/dL (ref 13.0–17.0)
Immature Granulocytes: 0 %
Lymphocytes Relative: 21 %
Lymphs Abs: 2.2 10*3/uL (ref 0.7–4.0)
MCH: 30.8 pg (ref 26.0–34.0)
MCHC: 33 g/dL (ref 30.0–36.0)
MCV: 93.4 fL (ref 80.0–100.0)
Monocytes Absolute: 0.6 10*3/uL (ref 0.1–1.0)
Monocytes Relative: 6 %
Neutro Abs: 7.5 10*3/uL (ref 1.7–7.7)
Neutrophils Relative %: 70 %
Platelet Count: DECREASED 10*3/uL (ref 150–400)
RBC: 5.03 MIL/uL (ref 4.22–5.81)
RDW: 12.4 % (ref 11.5–15.5)
Smear Review: DECREASED
WBC Count: 10.7 10*3/uL — ABNORMAL HIGH (ref 4.0–10.5)
nRBC: 0 % (ref 0.0–0.2)

## 2022-07-27 NOTE — Progress Notes (Signed)
  [facility] OFFICE PROGRESS NOTE   Diagnosis: CLL  INTERVAL HISTORY:   Mr. Juday returns as scheduled.  He feels well.  He continues on acalabrutinib.  No complaint.  Objective:  Vital signs in last 24 hours:  Blood pressure (!) 155/79, pulse 60, temperature 98.1 F (36.7 C), temperature source Oral, resp. rate 20, height '5\' 3"'$  (1.6 m), weight 165 lb 12.8 oz (75.2 kg), SpO2 100 %.     Lymphatics: No cervical, supraclavicular, axillary, or inguinal nodes Resp: Lungs clear bilaterally Cardio: Regular rate and rhythm GI: No hepatosplenomegaly Vascular: No leg edema  Lab Results:  Lab Results  Component Value Date   WBC 10.7 (H) 07/27/2022   HGB 15.5 07/27/2022   HCT 47.0 07/27/2022   MCV 93.4 07/27/2022   PLT  07/27/2022    PLATELET CLUMPS NOTED ON SMEAR, COUNT APPEARS DECREASED   NEUTROABS 7.5 07/27/2022    CMP  Lab Results  Component Value Date   NA 140 03/29/2022   K 4.2 03/29/2022   CL 107 03/29/2022   CO2 26 03/29/2022   GLUCOSE 96 03/29/2022   BUN 25 (H) 03/29/2022   CREATININE 1.32 (H) 03/29/2022   CALCIUM 9.0 03/29/2022   PROT 6.7 03/29/2022   ALBUMIN 4.2 03/29/2022   AST 13 (L) 03/29/2022   ALT 11 03/29/2022   ALKPHOS 56 03/29/2022   BILITOT 0.6 03/29/2022   GFRNONAA 55 (L) 03/29/2022   GFRAA 54 (L) 03/08/2020    Medications: I have reviewed the patient's current medications.   Assessment/Plan: CLL Review of the peripheral blood smear is consistent with chronic lymphocytic leukemia 12/16/2014 Peripheral blood flow cytometry consistent with CLL 10/16/2018 MRI brain and cervical spine - Prominent bulky cervical adenopathy within the visualized neck, compatible with history of CLL. FISH analysis- gain of 13q; 17p and 11q not detected Acalabrutinib 11/22/2018 2. thrombocytopenia-likely secondary to chronic lymphocytic leukemia versus "pseudo thrombocytopenia" due to platelet clumping; progressive thrombocytopenia June 2018 Trial  of prednisone started 10/17/2018 after admission with a fall/subarachnoid hemorrhage-starting dose 60 mg daily Prednisone tapered to 40 mg daily beginning 10/22/2018 Prednisone taper to 30 mg daily beginning 10/29/2018, tapered to off over 6 days beginning 11/13/2018 Bone marrow biopsy 11/09/2018-hypercellular marrow with prominent involvement by CLL, abundant megakaryocytes, lymphocytes represent 82% of all cells, the cytogenetics returned normal 3. Hospitalization with sepsis/UTI with bacteremia (Enterobacter aerogenes) March 2018 4. Hospitalization for syncopal episode resulting in fall. Developed a small right subarachnoid hemorrhage.  5. MRI brain 10/16/2018 - Focal 1-2 cm osseous lesions involving the right parietal and occipital calvarium as above, indeterminate. Findings are of uncertain significance, with no definite corresponding osseous lesion seen on prior CT. 6.  History of idiopathic "anaphylactoid "reactions 7.  Paroxysmal atrial fibrillation atrial fibrillation noted on a cardiac monitor 2020, not placed on anticoagulation secondary to thrombocytopenia         Disposition: Mr. Barsky appears stable.  He is in clinical remission from CLL.  He has chronic mild thrombocytopenia secondary to CLL and at acalabrutinib.  The platelets are clumped on the blood smear today.  He will continue alcohol ibrutinib.  He will return for a CBC a citrate tube and blood smear in 2 weeks.  He will return for an office visit in 6 months.  Mr. Yaffe reports being up-to-date on influenza, COVID-19, and pneumonia vaccines.  Betsy Coder, MD  07/27/2022  11:25 AM

## 2022-08-01 ENCOUNTER — Other Ambulatory Visit (HOSPITAL_COMMUNITY): Payer: Self-pay

## 2022-08-02 DIAGNOSIS — L578 Other skin changes due to chronic exposure to nonionizing radiation: Secondary | ICD-10-CM | POA: Diagnosis not present

## 2022-08-02 DIAGNOSIS — L821 Other seborrheic keratosis: Secondary | ICD-10-CM | POA: Diagnosis not present

## 2022-08-02 DIAGNOSIS — D2271 Melanocytic nevi of right lower limb, including hip: Secondary | ICD-10-CM | POA: Diagnosis not present

## 2022-08-02 DIAGNOSIS — L2084 Intrinsic (allergic) eczema: Secondary | ICD-10-CM | POA: Diagnosis not present

## 2022-08-02 DIAGNOSIS — L570 Actinic keratosis: Secondary | ICD-10-CM | POA: Diagnosis not present

## 2022-08-02 DIAGNOSIS — D225 Melanocytic nevi of trunk: Secondary | ICD-10-CM | POA: Diagnosis not present

## 2022-08-02 DIAGNOSIS — Z85828 Personal history of other malignant neoplasm of skin: Secondary | ICD-10-CM | POA: Diagnosis not present

## 2022-08-02 DIAGNOSIS — Z86018 Personal history of other benign neoplasm: Secondary | ICD-10-CM | POA: Diagnosis not present

## 2022-08-02 DIAGNOSIS — D485 Neoplasm of uncertain behavior of skin: Secondary | ICD-10-CM | POA: Diagnosis not present

## 2022-08-05 ENCOUNTER — Other Ambulatory Visit: Payer: Self-pay

## 2022-08-08 ENCOUNTER — Ambulatory Visit: Payer: Medicare PPO

## 2022-08-10 ENCOUNTER — Encounter: Payer: Self-pay | Admitting: Oncology

## 2022-08-10 ENCOUNTER — Inpatient Hospital Stay: Payer: Medicare PPO

## 2022-08-10 DIAGNOSIS — D696 Thrombocytopenia, unspecified: Secondary | ICD-10-CM | POA: Diagnosis not present

## 2022-08-10 DIAGNOSIS — C9110 Chronic lymphocytic leukemia of B-cell type not having achieved remission: Secondary | ICD-10-CM | POA: Diagnosis not present

## 2022-08-10 LAB — CBC WITH DIFFERENTIAL (CANCER CENTER ONLY)
Abs Immature Granulocytes: 0.03 10*3/uL (ref 0.00–0.07)
Basophils Absolute: 0.1 10*3/uL (ref 0.0–0.1)
Basophils Relative: 1 %
Eosinophils Absolute: 0.3 10*3/uL (ref 0.0–0.5)
Eosinophils Relative: 3 %
HCT: 45.9 % (ref 39.0–52.0)
Hemoglobin: 15.3 g/dL (ref 13.0–17.0)
Immature Granulocytes: 0 %
Lymphocytes Relative: 25 %
Lymphs Abs: 2.5 10*3/uL (ref 0.7–4.0)
MCH: 31 pg (ref 26.0–34.0)
MCHC: 33.3 g/dL (ref 30.0–36.0)
MCV: 92.9 fL (ref 80.0–100.0)
Monocytes Absolute: 0.7 10*3/uL (ref 0.1–1.0)
Monocytes Relative: 7 %
Neutro Abs: 6.4 10*3/uL (ref 1.7–7.7)
Neutrophils Relative %: 64 %
Platelet Count: 129 10*3/uL — ABNORMAL LOW (ref 150–400)
RBC: 4.94 MIL/uL (ref 4.22–5.81)
RDW: 12.5 % (ref 11.5–15.5)
WBC Count: 10 10*3/uL (ref 4.0–10.5)
nRBC: 0 % (ref 0.0–0.2)

## 2022-08-10 LAB — SAVE SMEAR(SSMR), FOR PROVIDER SLIDE REVIEW

## 2022-09-06 ENCOUNTER — Other Ambulatory Visit: Payer: Self-pay | Admitting: Oncology

## 2022-09-06 ENCOUNTER — Other Ambulatory Visit (HOSPITAL_COMMUNITY): Payer: Self-pay

## 2022-09-06 DIAGNOSIS — C9110 Chronic lymphocytic leukemia of B-cell type not having achieved remission: Secondary | ICD-10-CM

## 2022-09-07 ENCOUNTER — Other Ambulatory Visit: Payer: Self-pay

## 2022-09-07 ENCOUNTER — Other Ambulatory Visit (HOSPITAL_COMMUNITY): Payer: Self-pay

## 2022-09-07 MED ORDER — CALQUENCE 100 MG PO TABS
100.0000 mg | ORAL_TABLET | Freq: Every day | ORAL | 4 refills | Status: DC
  Filled 2022-09-07: qty 30, 30d supply, fill #0
  Filled 2022-10-06: qty 30, 30d supply, fill #1
  Filled 2022-11-03: qty 30, 30d supply, fill #2
  Filled 2022-12-05: qty 30, 30d supply, fill #3
  Filled 2023-01-03: qty 30, 30d supply, fill #4

## 2022-09-08 ENCOUNTER — Other Ambulatory Visit (HOSPITAL_COMMUNITY): Payer: Self-pay

## 2022-09-09 ENCOUNTER — Encounter: Payer: Self-pay | Admitting: Cardiology

## 2022-09-09 ENCOUNTER — Ambulatory Visit: Payer: Medicare PPO | Attending: Cardiology | Admitting: Cardiology

## 2022-09-09 VITALS — BP 128/70 | HR 64 | Ht 64.0 in | Wt 164.0 lb

## 2022-09-09 DIAGNOSIS — R55 Syncope and collapse: Secondary | ICD-10-CM | POA: Diagnosis not present

## 2022-09-09 NOTE — Progress Notes (Signed)
Electrophysiology Office Note   Date:  09/09/2022   ID:  Mathews, [name] 1940-12-10, MRN [id number]  PCP:  [name], MD  Cardiologist:  [name] Primary Electrophysiologist:  [name] [name], MD    Chief Complaint  Patient presents with   Atrial Fibrillation     History of Present Illness: [name] is a 82 y.o. male who is being seen today for the evaluation of syncope at the request of [name] Huddle, MD. Presenting today for electrophysiology evaluation.  She has a history of hypertension, nonobstructive coronary artery disease, hyperlipidemia, GERD, CLL, chronic thrombocytopenia.  He had episode of syncope associated with subarachnoid hemorrhage.  He is status post ILR implant.  ILR has reached ERI.  He has had no further episodes of syncope.  He presents today for possible ILR explant.  Today, denies symptoms of palpitations, chest pain, shortness of breath, orthopnea, PND, lower extremity edema, claudication, dizziness, presyncope, syncope, bleeding, or neurologic sequela. The patient is tolerating medications without difficulties.      Past Medical History:  Diagnosis Date   Acute encephalopathy 08/17/2016   Acute renal failure superimposed on stage 3 chronic kidney disease ([facility]) 10/15/2018   AKI (acute kidney injury) ([facility]) 08/16/2016   Arthritis    Bacteremia due to Enterobacter species 08/19/2016   Carpal tunnel syndrome, bilateral 10/09/2017   Chest tightness 10/15/2018   CLL (chronic lymphocytic leukemia) ([facility]) 12/16/2014   Dyspnea 09/20/2012   Elevated troponin 10/16/2018   Enterobacter sepsis ([facility]) 08/19/2016   Fall 10/15/2018   GERD (gastroesophageal reflux disease)    Hepatitis 1966   Drug reaction after taking medication   Hyperglycemia    Hyperlipemia    Hypertension    Hypokalemia 08/16/2016   Lung nodule seen on imaging study    bilateral lungs   Nasal bone fracture 10/15/2018   Right foot pain    SAH (subarachnoid hemorrhage) ([facility])  10/15/2018   Syncope 10/15/2018   Thrombocytopenia ([facility]) 10/15/2018   Urinary tract infection with hematuria    Past Surgical History:  Procedure Laterality Date   APPENDECTOMY     arthroscoyp  2000   left knee   COLONOSCOPY W/ POLYPECTOMY     LOOP RECORDER INSERTION N/A 10/18/2018   Procedure: LOOP RECORDER INSERTION;  Surgeon: [name], MD;  Location: [facility];  Service: Cardiovascular;  Laterality: N/A;   LUMBAR LAMINECTOMY/DECOMPRESSION MICRODISCECTOMY  06/27/2012   Procedure: LUMBAR LAMINECTOMY/DECOMPRESSION MICRODISCECTOMY 1 LEVEL;  Surgeon: [name], MD;  Location: [facility];  Service: Neurosurgery;  Laterality: Bilateral;  bilateral four-five laminectony   SKIN CANCER EXCISION  5-6 years ago   moe-s surgery- basal b   TONSILLECTOMY       Current Outpatient Medications  Medication Sig Dispense Refill   acalabrutinib maleate (CALQUENCE) 100 MG tablet Take 1 tablet (100 mg) by mouth daily. 30 tablet 4   acetaminophen (TYLENOL) 325 MG tablet Take 325 mg by mouth daily as needed for headache (pain).     cholecalciferol (VITAMIN D3) 25 MCG (1000 UNIT) tablet Take 1,000 Units by mouth daily.      clobetasol cream (TEMOVATE) [id number] % Apply 1 Application topically as needed.     EPINEPHrine (EPIPEN) 0.3 mg/0.3 mL DEVI Inject 0.3 mLs (0.3 mg total) into the muscle once. 1 Device 0   metoprolol succinate (TOPROL-XL) 25 MG 24 hr tablet TAKE 1 TABLET(25 MG) BY MOUTH DAILY 90 tablet 2   simvastatin (ZOCOR) 40 MG tablet Take 20  mg by mouth daily.      No current facility-administered medications for this visit.    Allergies:   Ace inhibitors   Social History:  The patient  reports that he has never smoked. He has never used smokeless tobacco. He reports current alcohol use of about 2.0 standard drinks of alcohol per week. He reports that he does not use drugs.   Family History:  The patient's family history includes Dementia in his father; Seizures in his sister;  Sudden death (age of onset: 52) in his mother.   ROS:  Please see the history of present illness.   Otherwise, review of systems is positive for none.   All other systems are reviewed and negative.   PHYSICAL EXAM: VS:  BP 128/70 (BP Location: Left Arm)   Pulse 64   Ht 5\' 4"  (1.626 m)   Wt 164 lb (74.4 kg)   BMI 28.15 kg/m  , BMI Body mass index is 28.15 kg/m. GEN: Well nourished, well developed, in no acute distress  HEENT: normal  Neck: no JVD, carotid bruits, or masses Cardiac: RRR; no murmurs, rubs, or gallops,no edema  Respiratory:  clear to auscultation bilaterally, normal work of breathing GI: soft, nontender, nondistended, + BS MS: no deformity or atrophy  Skin: warm and dry, device site well healed Neuro:  Strength and sensation are intact Psych: euthymic mood, full affect   Recent Labs: 03/29/2022: ALT 11; BUN 25; Creatinine 1.32; Potassium 4.2; Sodium 140 08/10/2022: Hemoglobin 15.3; Platelet Count 129    Lipid Panel     Component Value Date/Time   CHOL 106 10/16/2018 0339   TRIG 125 10/16/2018 0339   HDL 20 (L) 10/16/2018 0339   CHOLHDL 5.3 10/16/2018 0339   VLDL 25 10/16/2018 0339   LDLCALC 61 10/16/2018 0339     Wt Readings from Last 3 Encounters:  09/09/22 164 lb (74.4 kg)  07/27/22 165 lb 12.8 oz (75.2 kg)  04/04/22 160 lb (72.6 kg)      Other studies Reviewed: Additional studies/ records that were reviewed today include: TTE 10/16/18  Review of the above records today demonstrates:   1. The left ventricle has hyperdynamic systolic function, with an ejection fraction of >65%. The cavity size was normal. Left ventricular diastolic Doppler parameters are consistent with pseudonormal.  2. There is a dynamic mid LV gradient of 25 mmHg.  3. The right ventricle has normal systolc function. There is no increase in right ventricular wall thickness.  4. No evidence of mitral valve stenosis.  5. No stenosis of the aortic valve.   ASSESSMENT AND  PLAN:  1.  Syncope: Status post ILR implant.  No further episodes of syncope.  ILR battery has reached ERI.  We discussed further options for ILR management.  This includes ILR explant or simply leaving it in.  This point, he would prefer to avoid explant.  Kuba Shepherd call us back if he has any further issues.  2.  Coronary disease: No current chest pain.  Continue plan per primary cardiology.  3.  Paroxysmal atrial fibrillation: Has had 1 episode.  Platelets are low and hematology feels that anticoagulation should be avoided.  No changes.  CHA2DS2-VASc of 4.   Current medicines are reviewed at length with the patient today.   The patient does not have concerns regarding his medicines.  The following changes were made today:  none  Labs/ tests ordered today include:  No orders of the defined types were placed in this encounter.  Disposition:   FU PRN year  Signed, Austan Nicholl [name], MD  09/09/2022 11:24 AM     [address] [place] [address] [phone] (office) [phone] (fax)

## 2022-09-29 ENCOUNTER — Inpatient Hospital Stay: Payer: Medicare PPO | Admitting: Oncology

## 2022-09-29 ENCOUNTER — Inpatient Hospital Stay: Payer: Medicare PPO

## 2022-09-29 ENCOUNTER — Other Ambulatory Visit (HOSPITAL_BASED_OUTPATIENT_CLINIC_OR_DEPARTMENT_OTHER): Payer: Self-pay

## 2022-09-29 MED ORDER — COMIRNATY 30 MCG/0.3ML IM SUSY
0.3000 mL | PREFILLED_SYRINGE | Freq: Every day | INTRAMUSCULAR | 0 refills | Status: DC
  Filled 2022-09-29: qty 0.3, 1d supply, fill #0

## 2022-10-03 ENCOUNTER — Other Ambulatory Visit (HOSPITAL_BASED_OUTPATIENT_CLINIC_OR_DEPARTMENT_OTHER): Payer: Self-pay

## 2022-10-06 ENCOUNTER — Other Ambulatory Visit: Payer: Self-pay

## 2022-10-06 ENCOUNTER — Other Ambulatory Visit (HOSPITAL_COMMUNITY): Payer: Self-pay

## 2022-11-03 ENCOUNTER — Other Ambulatory Visit (HOSPITAL_COMMUNITY): Payer: Self-pay

## 2022-11-07 ENCOUNTER — Other Ambulatory Visit (HOSPITAL_COMMUNITY): Payer: Self-pay

## 2022-12-02 ENCOUNTER — Other Ambulatory Visit (HOSPITAL_BASED_OUTPATIENT_CLINIC_OR_DEPARTMENT_OTHER): Payer: Self-pay

## 2022-12-05 ENCOUNTER — Other Ambulatory Visit: Payer: Self-pay

## 2022-12-09 ENCOUNTER — Other Ambulatory Visit (HOSPITAL_COMMUNITY): Payer: Self-pay

## 2022-12-17 ENCOUNTER — Other Ambulatory Visit (HOSPITAL_BASED_OUTPATIENT_CLINIC_OR_DEPARTMENT_OTHER): Payer: Self-pay

## 2022-12-20 ENCOUNTER — Telehealth: Payer: Self-pay

## 2022-12-20 NOTE — Telephone Encounter (Signed)
Spoke with pt regarding his request to receive Pemgarda at his next appt. Pt informed per pharmacy that Pemgarda is not in stock at [facility] and is not given at this location. Pt verbalizes understanding.

## 2022-12-29 ENCOUNTER — Other Ambulatory Visit (HOSPITAL_COMMUNITY): Payer: Self-pay

## 2023-01-03 ENCOUNTER — Other Ambulatory Visit (HOSPITAL_COMMUNITY): Payer: Self-pay

## 2023-01-13 ENCOUNTER — Other Ambulatory Visit: Payer: Self-pay

## 2023-01-19 ENCOUNTER — Telehealth: Payer: Self-pay | Admitting: *Deleted

## 2023-01-19 NOTE — Telephone Encounter (Signed)
Mr. Majors presented to clinic for lab/OV today and was informed his appointment is on 01/26/23. He expressed frustration that he has come to office x 2 recently and told his appointment was on different day. This RN is unable to see any trail of his appointments being changed and he does have MyChart. I apologized for the situation and asked if he wants to speak w/the office manager? He said no and appreciated nurse allowing him to vent.

## 2023-01-26 ENCOUNTER — Inpatient Hospital Stay: Payer: Medicare PPO | Admitting: Oncology

## 2023-01-26 ENCOUNTER — Encounter: Payer: Self-pay | Admitting: Oncology

## 2023-01-26 ENCOUNTER — Inpatient Hospital Stay: Payer: Medicare PPO | Attending: Oncology

## 2023-01-26 VITALS — BP 156/69 | HR 60 | Temp 98.2°F | Resp 18 | Ht 64.0 in | Wt 162.0 lb

## 2023-01-26 DIAGNOSIS — C9110 Chronic lymphocytic leukemia of B-cell type not having achieved remission: Secondary | ICD-10-CM | POA: Insufficient documentation

## 2023-01-26 DIAGNOSIS — D696 Thrombocytopenia, unspecified: Secondary | ICD-10-CM | POA: Diagnosis not present

## 2023-01-26 LAB — CMP (CANCER CENTER ONLY)
ALT: 9 U/L (ref 0–44)
AST: 14 U/L — ABNORMAL LOW (ref 15–41)
Albumin: 4.6 g/dL (ref 3.5–5.0)
Alkaline Phosphatase: 53 U/L (ref 38–126)
Anion gap: 8 (ref 5–15)
BUN: 24 mg/dL — ABNORMAL HIGH (ref 8–23)
CO2: 25 mmol/L (ref 22–32)
Calcium: 9.2 mg/dL (ref 8.9–10.3)
Chloride: 104 mmol/L (ref 98–111)
Creatinine: 1.35 mg/dL — ABNORMAL HIGH (ref 0.61–1.24)
GFR, Estimated: 53 mL/min — ABNORMAL LOW (ref >60)
Glucose, Bld: 97 mg/dL (ref 70–99)
Potassium: 4.1 mmol/L (ref 3.5–5.1)
Sodium: 137 mmol/L (ref 135–145)
Total Bilirubin: 1.1 mg/dL (ref 0.3–1.2)
Total Protein: 6.5 g/dL (ref 6.5–8.1)

## 2023-01-26 LAB — CBC WITH DIFFERENTIAL (CANCER CENTER ONLY)
Abs Immature Granulocytes: 0.03 10*3/uL (ref 0.00–0.07)
Basophils Absolute: 0.1 10*3/uL (ref 0.0–0.1)
Basophils Relative: 1 %
Eosinophils Absolute: 0.3 10*3/uL (ref 0.0–0.5)
Eosinophils Relative: 3 %
HCT: 45.7 % (ref 39.0–52.0)
Hemoglobin: 15.5 g/dL (ref 13.0–17.0)
Immature Granulocytes: 0 %
Lymphocytes Relative: 33 %
Lymphs Abs: 3.3 10*3/uL (ref 0.7–4.0)
MCH: 31.3 pg (ref 26.0–34.0)
MCHC: 33.9 g/dL (ref 30.0–36.0)
MCV: 92.1 fL (ref 80.0–100.0)
Monocytes Absolute: 0.7 10*3/uL (ref 0.1–1.0)
Monocytes Relative: 7 %
Neutro Abs: 5.8 10*3/uL (ref 1.7–7.7)
Neutrophils Relative %: 56 %
Platelet Count: 138 10*3/uL — ABNORMAL LOW (ref 150–400)
RBC: 4.96 MIL/uL (ref 4.22–5.81)
RDW: 12.3 % (ref 11.5–15.5)
WBC Count: 10.2 10*3/uL (ref 4.0–10.5)
nRBC: 0 % (ref 0.0–0.2)

## 2023-01-26 NOTE — Progress Notes (Signed)
  [facility] OFFICE PROGRESS NOTE   Diagnosis: CLL  INTERVAL HISTORY:   Mr. Golas turns as scheduled.  He feels well.  No fever, night sweats, anorexia, or bleeding.  He continues on acalabrutinib.  No syncope events.  Objective:  Vital signs in last 24 hours:  Blood pressure (!) 156/69, pulse 60, temperature 98.2 F (36.8 C), temperature source Oral, resp. rate 18, height 5\' 4"  (1.626 m), weight 162 lb (73.5 kg), SpO2 100%.    HEENT: No thrush or ulcers Lymphatics: No cervical, supraclavicular, axillary, or inguinal nodes Resp: Lungs clear bilaterally Cardio: Regular rate and rhythm GI: No hepatosplenomegaly Vascular: No leg edema   Lab Results:  Lab Results  Component Value Date   WBC 10.2 01/26/2023   HGB 15.5 01/26/2023   HCT 45.7 01/26/2023   MCV 92.1 01/26/2023   PLT 138 (L) 01/26/2023   NEUTROABS 5.8 01/26/2023    CMP  Lab Results  Component Value Date   NA 140 03/29/2022   K 4.2 03/29/2022   CL 107 03/29/2022   CO2 26 03/29/2022   GLUCOSE 96 03/29/2022   BUN 25 (H) 03/29/2022   CREATININE 1.32 (H) 03/29/2022   CALCIUM 9.0 03/29/2022   PROT 6.7 03/29/2022   ALBUMIN 4.2 03/29/2022   AST 13 (L) 03/29/2022   ALT 11 03/29/2022   ALKPHOS 56 03/29/2022   BILITOT 0.6 03/29/2022   GFRNONAA 55 (L) 03/29/2022   GFRAA 54 (L) 03/08/2020     Medications: I have reviewed the patient's current medications.   Assessment/Plan: CLL Review of the peripheral blood smear is consistent with chronic lymphocytic leukemia 12/16/2014 Peripheral blood flow cytometry consistent with CLL 10/16/2018 MRI brain and cervical spine - Prominent bulky cervical adenopathy within the visualized neck, compatible with history of CLL. FISH analysis- gain of 13q; 17p and 11q not detected Acalabrutinib 11/22/2018 2. thrombocytopenia-likely secondary to chronic lymphocytic leukemia versus "pseudo thrombocytopenia" due to platelet clumping; progressive thrombocytopenia  June 2018 Trial of prednisone started 10/17/2018 after admission with a fall/subarachnoid hemorrhage-starting dose 60 mg daily Prednisone tapered to 40 mg daily beginning 10/22/2018 Prednisone taper to 30 mg daily beginning 10/29/2018, tapered to off over 6 days beginning 11/13/2018 Bone marrow biopsy 11/09/2018-hypercellular marrow with prominent involvement by CLL, abundant megakaryocytes, lymphocytes represent 82% of all cells, the cytogenetics returned normal 3. Hospitalization with sepsis/UTI with bacteremia (Enterobacter aerogenes) March 2018 4. Hospitalization for syncopal episode resulting in fall. Developed a small right subarachnoid hemorrhage.  5. MRI brain 10/16/2018 - Focal 1-2 cm osseous lesions involving the right parietal and occipital calvarium as above, indeterminate. Findings are of uncertain significance, with no definite corresponding osseous lesion seen on prior CT. 6.  History of idiopathic "anaphylactoid "reactions 7.  Paroxysmal atrial fibrillation atrial fibrillation noted on a cardiac monitor 2020, not placed on anticoagulation secondary to thrombocytopenia         Disposition: Mr. Rihn is asymptomatic from CLL.  He is stable from a hematologic standpoint.  He will continue on acalabrutinib.  He is tolerating the acalabrutinib well.  He will remain up-to-date on influenza, pneumonia, and COVID vaccines.  He will return for an office and lab visit in 6 months.  Thornton Papas, MD  01/26/2023  10:09 AM

## 2023-02-07 ENCOUNTER — Other Ambulatory Visit (HOSPITAL_COMMUNITY): Payer: Self-pay

## 2023-02-07 ENCOUNTER — Other Ambulatory Visit: Payer: Self-pay | Admitting: Oncology

## 2023-02-07 ENCOUNTER — Other Ambulatory Visit: Payer: Self-pay

## 2023-02-07 DIAGNOSIS — D225 Melanocytic nevi of trunk: Secondary | ICD-10-CM | POA: Diagnosis not present

## 2023-02-07 DIAGNOSIS — L578 Other skin changes due to chronic exposure to nonionizing radiation: Secondary | ICD-10-CM | POA: Diagnosis not present

## 2023-02-07 DIAGNOSIS — L821 Other seborrheic keratosis: Secondary | ICD-10-CM | POA: Diagnosis not present

## 2023-02-07 DIAGNOSIS — Z86018 Personal history of other benign neoplasm: Secondary | ICD-10-CM | POA: Diagnosis not present

## 2023-02-07 DIAGNOSIS — L570 Actinic keratosis: Secondary | ICD-10-CM | POA: Diagnosis not present

## 2023-02-07 DIAGNOSIS — D485 Neoplasm of uncertain behavior of skin: Secondary | ICD-10-CM | POA: Diagnosis not present

## 2023-02-07 DIAGNOSIS — D2271 Melanocytic nevi of right lower limb, including hip: Secondary | ICD-10-CM | POA: Diagnosis not present

## 2023-02-07 DIAGNOSIS — Z85828 Personal history of other malignant neoplasm of skin: Secondary | ICD-10-CM | POA: Diagnosis not present

## 2023-02-07 DIAGNOSIS — C9110 Chronic lymphocytic leukemia of B-cell type not having achieved remission: Secondary | ICD-10-CM

## 2023-02-07 DIAGNOSIS — C44519 Basal cell carcinoma of skin of other part of trunk: Secondary | ICD-10-CM | POA: Diagnosis not present

## 2023-02-07 DIAGNOSIS — D2272 Melanocytic nevi of left lower limb, including hip: Secondary | ICD-10-CM | POA: Diagnosis not present

## 2023-02-07 MED ORDER — CALQUENCE 100 MG PO TABS
100.0000 mg | ORAL_TABLET | Freq: Every day | ORAL | 4 refills | Status: DC
Start: 2023-02-07 — End: 2023-06-27
  Filled 2023-02-07: qty 30, 30d supply, fill #0
  Filled 2023-03-10: qty 30, 30d supply, fill #1
  Filled 2023-04-04: qty 30, 30d supply, fill #2
  Filled 2023-05-02: qty 30, 30d supply, fill #3
  Filled 2023-05-31: qty 30, 30d supply, fill #4

## 2023-02-08 ENCOUNTER — Other Ambulatory Visit (HOSPITAL_COMMUNITY): Payer: Self-pay

## 2023-02-08 ENCOUNTER — Telehealth: Payer: Self-pay | Admitting: Cardiology

## 2023-02-08 NOTE — Telephone Encounter (Signed)
Pt is requesting a Provider switch from Dr. Anne Fu to Dr. Duke Salvia due to him being closer to the [facility] location.

## 2023-02-09 ENCOUNTER — Encounter: Payer: Self-pay | Admitting: Cardiology

## 2023-02-10 ENCOUNTER — Other Ambulatory Visit (HOSPITAL_COMMUNITY): Payer: Self-pay

## 2023-02-13 ENCOUNTER — Other Ambulatory Visit (HOSPITAL_COMMUNITY): Payer: Self-pay

## 2023-03-06 ENCOUNTER — Other Ambulatory Visit (HOSPITAL_BASED_OUTPATIENT_CLINIC_OR_DEPARTMENT_OTHER): Payer: Self-pay

## 2023-03-06 MED ORDER — COVID-19 MRNA VAC-TRIS(PFIZER) 30 MCG/0.3ML IM SUSY
0.3000 mL | PREFILLED_SYRINGE | Freq: Once | INTRAMUSCULAR | 0 refills | Status: AC
  Filled 2023-03-06: qty 0.3, 1d supply, fill #0

## 2023-03-09 ENCOUNTER — Other Ambulatory Visit (HOSPITAL_COMMUNITY): Payer: Self-pay

## 2023-03-10 ENCOUNTER — Other Ambulatory Visit (HOSPITAL_COMMUNITY): Payer: Self-pay

## 2023-03-16 NOTE — Telephone Encounter (Signed)
February 09, 2023 Jake Bathe, MD  to Conception Oms, MD     02/09/23  6:23 AM Ok with me  Ok per Dr Duke Salvia

## 2023-03-21 ENCOUNTER — Other Ambulatory Visit (HOSPITAL_BASED_OUTPATIENT_CLINIC_OR_DEPARTMENT_OTHER): Payer: Self-pay

## 2023-03-21 MED ORDER — FLUAD 0.5 ML IM SUSY
PREFILLED_SYRINGE | INTRAMUSCULAR | 0 refills | Status: DC
  Filled 2023-03-21: qty 0.5, 1d supply, fill #0

## 2023-03-28 DIAGNOSIS — C4441 Basal cell carcinoma of skin of scalp and neck: Secondary | ICD-10-CM | POA: Diagnosis not present

## 2023-04-04 ENCOUNTER — Other Ambulatory Visit: Payer: Self-pay

## 2023-04-04 NOTE — Progress Notes (Signed)
Specialty Pharmacy Refill Coordination Note  [name] is a 82 y.o. male contacted today regarding refills of specialty medication(s) Acalabrutinib Maleate   Patient requested Delivery   Delivery date: 04/14/23   Verified address: [address]   [place] [address]   Medication will be filled on 04/13/23.

## 2023-05-02 ENCOUNTER — Other Ambulatory Visit: Payer: Self-pay

## 2023-05-02 DIAGNOSIS — I2510 Atherosclerotic heart disease of native coronary artery without angina pectoris: Secondary | ICD-10-CM | POA: Diagnosis not present

## 2023-05-02 DIAGNOSIS — I10 Essential (primary) hypertension: Secondary | ICD-10-CM | POA: Diagnosis not present

## 2023-05-02 DIAGNOSIS — N401 Enlarged prostate with lower urinary tract symptoms: Secondary | ICD-10-CM | POA: Diagnosis not present

## 2023-05-02 DIAGNOSIS — E782 Mixed hyperlipidemia: Secondary | ICD-10-CM | POA: Diagnosis not present

## 2023-05-02 DIAGNOSIS — C9110 Chronic lymphocytic leukemia of B-cell type not having achieved remission: Secondary | ICD-10-CM | POA: Diagnosis not present

## 2023-05-02 DIAGNOSIS — R972 Elevated prostate specific antigen [PSA]: Secondary | ICD-10-CM | POA: Diagnosis not present

## 2023-05-02 DIAGNOSIS — Z23 Encounter for immunization: Secondary | ICD-10-CM | POA: Diagnosis not present

## 2023-05-02 DIAGNOSIS — Z0000 Encounter for general adult medical examination without abnormal findings: Secondary | ICD-10-CM | POA: Diagnosis not present

## 2023-05-02 DIAGNOSIS — N183 Chronic kidney disease, stage 3 unspecified: Secondary | ICD-10-CM | POA: Diagnosis not present

## 2023-05-02 DIAGNOSIS — E559 Vitamin D deficiency, unspecified: Secondary | ICD-10-CM | POA: Diagnosis not present

## 2023-05-02 DIAGNOSIS — G479 Sleep disorder, unspecified: Secondary | ICD-10-CM | POA: Diagnosis not present

## 2023-05-02 NOTE — Progress Notes (Signed)
Specialty Pharmacy Refill Coordination Note  [name] is a 82 y.o. male contacted today regarding refills of specialty medication(s) Acalabrutinib Maleate   Patient requested Delivery   Delivery date: 05/09/23   Verified address: [address] [place] [address]   Medication will be filled on 05/08/23.

## 2023-05-03 DIAGNOSIS — D696 Thrombocytopenia, unspecified: Secondary | ICD-10-CM | POA: Diagnosis not present

## 2023-05-03 DIAGNOSIS — N1831 Chronic kidney disease, stage 3a: Secondary | ICD-10-CM | POA: Diagnosis not present

## 2023-05-03 DIAGNOSIS — M79672 Pain in left foot: Secondary | ICD-10-CM | POA: Diagnosis not present

## 2023-05-03 DIAGNOSIS — I480 Paroxysmal atrial fibrillation: Secondary | ICD-10-CM | POA: Diagnosis not present

## 2023-05-11 ENCOUNTER — Encounter: Payer: Self-pay | Admitting: *Deleted

## 2023-05-16 DIAGNOSIS — I2510 Atherosclerotic heart disease of native coronary artery without angina pectoris: Secondary | ICD-10-CM | POA: Diagnosis not present

## 2023-05-16 DIAGNOSIS — R972 Elevated prostate specific antigen [PSA]: Secondary | ICD-10-CM | POA: Diagnosis not present

## 2023-05-16 DIAGNOSIS — I10 Essential (primary) hypertension: Secondary | ICD-10-CM | POA: Diagnosis not present

## 2023-05-16 DIAGNOSIS — E782 Mixed hyperlipidemia: Secondary | ICD-10-CM | POA: Diagnosis not present

## 2023-05-16 LAB — LAB REPORT - SCANNED: EGFR: 40

## 2023-05-22 ENCOUNTER — Encounter (HOSPITAL_BASED_OUTPATIENT_CLINIC_OR_DEPARTMENT_OTHER): Payer: Self-pay | Admitting: Pulmonary Disease

## 2023-05-22 ENCOUNTER — Ambulatory Visit (HOSPITAL_BASED_OUTPATIENT_CLINIC_OR_DEPARTMENT_OTHER): Payer: Medicare PPO | Admitting: Pulmonary Disease

## 2023-05-22 VITALS — BP 126/80 | HR 65 | Ht 64.0 in | Wt 162.8 lb

## 2023-05-22 DIAGNOSIS — R0602 Shortness of breath: Secondary | ICD-10-CM | POA: Diagnosis not present

## 2023-05-22 DIAGNOSIS — R29898 Other symptoms and signs involving the musculoskeletal system: Secondary | ICD-10-CM | POA: Diagnosis not present

## 2023-05-22 NOTE — Progress Notes (Signed)
Subjective:   PATIENT ID: [name] GENDER: male DOB: April 17, 1941, MRN: [id number]  Chief Complaint  Patient presents with   Consult    Having feeling like he not getting  enough air, not using any oxygen , going on for about 2 years     Reason for Visit: New consult for shortness of breath  [name] is a 82 year old male never smoker with CLL with thrombocytopenia, CKD IIIa, GERD, HTN, HLD, hx pulmonary nodules, pAF who presents for shortness of breath.   He has had shortness of breath for some time and has worsened in the last two years. His daughter is a primary care physician and wonders if this is deconditioning. He walks 1 8/10 mile daily with some shortness of breath at a slow pace. May be associated with his atrial fib but feels this is currently well controlled. Denies coughing or wheezing. No worsening of breathing at night. Denies snoring. He had asthma as a teenager and outgrew it. He denies prolonged respiratory illnesses. Denies smoker/illicit drugs.   Social History: Never smoker Retired professor - English  I have personally reviewed patient's past medical/family/social history, allergies, current medications.  Past Medical History:  Diagnosis Date   Acute encephalopathy 08/17/2016   Acute renal failure superimposed on stage 3 chronic kidney disease ([facility]) 10/15/2018   AKI (acute kidney injury) ([facility]) 08/16/2016   Arthritis    Bacteremia due to Enterobacter species 08/19/2016   Carpal tunnel syndrome, bilateral 10/09/2017   Chest tightness 10/15/2018   CLL (chronic lymphocytic leukemia) ([facility]) 12/16/2014   Dyspnea 09/20/2012   Elevated troponin 10/16/2018   Enterobacter sepsis ([facility]) 08/19/2016   Fall 10/15/2018   GERD (gastroesophageal reflux disease)    Hepatitis 1966   Drug reaction after taking medication   Hyperglycemia    Hyperlipemia    Hypertension    Hypokalemia 08/16/2016   Lung nodule seen on imaging study    bilateral lungs   Nasal bone  fracture 10/15/2018   Right foot pain    SAH (subarachnoid hemorrhage) ([facility]) 10/15/2018   Syncope 10/15/2018   Thrombocytopenia ([facility]) 10/15/2018   Urinary tract infection with hematuria      Family History  Problem Relation Age of Onset   Sudden death Mother 58       possible botulism   Dementia Father    Seizures Sister      Social History   Occupational History   Occupation: Retired  Tobacco Use   Smoking status: Never   Smokeless tobacco: Never  Vaping Use   Vaping status: Never Used  Substance and Sexual Activity   Alcohol use: Yes    Alcohol/week: 2.0 standard drinks of alcohol    Types: 2 Cans of beer per week    Comment: rare 2 beers per month   Drug use: No   Sexual activity: Not on file    Allergies  Allergen Reactions   Ace Inhibitors     Other reaction(s): cough     Outpatient Medications Prior to Visit  Medication Sig Dispense Refill   acalabrutinib maleate (CALQUENCE) 100 MG tablet Take 1 tablet (100 mg) by mouth daily. 30 tablet 4   acetaminophen (TYLENOL) 325 MG tablet Take 325 mg by mouth daily as needed for headache (pain).     cholecalciferol (VITAMIN D3) 25 MCG (1000 UNIT) tablet Take 1,000 Units by mouth daily.      clobetasol cream (TEMOVATE) 0.05 % Apply 1 Application topically as needed.  COVID-19 mRNA vaccine 2023-2024 (COMIRNATY) syringe Inject 0.3 mLs into the muscle daily. 0.3 mL 0   EPINEPHrine (EPIPEN) 0.3 mg/0.3 mL DEVI Inject 0.3 mLs (0.3 mg total) into the muscle once. 1 Device 0   influenza vaccine adjuvanted (FLUAD) 0.5 ML injection Inject into the muscle. 0.5 mL 0   metoprolol succinate (TOPROL-XL) 25 MG 24 hr tablet TAKE 1 TABLET(25 MG) BY MOUTH DAILY 90 tablet 2   olmesartan (BENICAR) 20 MG tablet Take 20 mg by mouth daily.     simvastatin (ZOCOR) 40 MG tablet Take 20 mg by mouth daily.      No facility-administered medications prior to visit.    Review of Systems  Constitutional:  Negative for chills, diaphoresis, fever,  malaise/fatigue and weight loss.  HENT:  Negative for congestion.   Respiratory:  Positive for shortness of breath. Negative for cough, hemoptysis, sputum production and wheezing.   Cardiovascular:  Negative for chest pain, palpitations and leg swelling.     Objective:   Vitals:   05/22/23 0914  BP: 126/80  Pulse: 65  SpO2: 99%  Weight: 162 lb 12.8 oz (73.8 kg)  Height: 5\' 4"  (1.626 m)   SpO2: 99 %  Physical Exam: General: Well-appearing, no acute distress HENT: [place], AT Eyes: EOMI, no scleral icterus Respiratory: Clear to auscultation bilaterally.  No crackles, wheezing or rales Cardiovascular: RRR, -M/R/G, no JVD Extremities:-Edema,-tenderness Neuro: AAO x4, CNII-XII grossly intact Psych: Normal mood, normal affect  Data Reviewed:  Imaging: CXR 03/08/20 - normal CXR CTA 10/15/18 - No PE. Lower cervical lymphadenopathy with mild/moderate mediastinal and hilar lymphadenopathy up to 12 mm. Mild chronic scarring noted in lingular and medial RML, 1 cm LLL nodule previously 7 mm in 2010, likely benign  PFT: None on file  Labs: CBC    Component Value Date/Time   WBC 10.2 01/26/2023 0922   WBC 17.2 (H) 03/08/2020 1815   RBC 4.96 01/26/2023 0922   HGB 15.5 01/26/2023 0922   HGB 14.1 06/23/2017 0758   HCT 45.7 01/26/2023 0922   HCT 42.2 06/23/2017 0758   PLT 138 (L) 01/26/2023 0922   PLT 37 Large & giant platelets (L) 06/23/2017 0758   MCV 92.1 01/26/2023 0922   MCV 92.7 06/23/2017 0758   MCH 31.3 01/26/2023 0922   MCHC 33.9 01/26/2023 0922   RDW 12.3 01/26/2023 0922   RDW 13.1 06/23/2017 0758   LYMPHSABS 3.3 01/26/2023 0922   LYMPHSABS 19.2 (H) 06/23/2017 0758   MONOABS 0.7 01/26/2023 0922   MONOABS 1.0 (H) 06/23/2017 0758   EOSABS 0.3 01/26/2023 0922   EOSABS 0.4 06/23/2017 0758   BASOSABS 0.1 01/26/2023 0922   BASOSABS 0.1 06/23/2017 0758   Absolute eos  06/23/17 - 400 01/26/23 - 300     Assessment & Plan:   Discussion: 82 year old male never smoker with  CLL with thrombocytopenia, CKD IIIa, GERD, HTN, HLD, hx pulmonary nodules, pAF who presents for shortness of breath. No environmental risks. Able to tolerate some exercise at baseline but lacks endurance and stamina for more intense exercise. Suspect deconditioning rather than underlying lung disease. Will evaluate with PFTs to rule out.   Shortness of breath Deconditioning --ORDER pulmonary function test --REFER pulmonary rehab  Health Maintenance Immunization History  Administered Date(s) Administered   Fluad Trivalent(High Dose 65+) 03/21/2023   Hepatitis A, Adult 08/16/2018   Influenza Split 04/12/2013, 03/17/2015, 04/21/2016, 02/24/2017, 03/14/2019, 03/23/2020   Influenza, High Dose Seasonal PF 03/07/2018, 03/24/2020   Influenza-Unspecified 03/22/2022   PFIZER(Purple  Top)SARS-COV-2 Vaccination 07/07/2019, 07/27/2019, 08/16/2019, 09/06/2019, 02/03/2020, 02/18/2020   Pfizer Covid-19 Vaccine Bivalent Booster 66yrs & up 10/20/2021   Pfizer(Comirnaty)Fall Seasonal Vaccine 12 years and older 09/29/2022, 03/06/2023   Pneumococcal Conjugate-13 11/15/2013   Pneumococcal Polysaccharide-23 03/28/2010   Respiratory Syncytial Virus Vaccine,Recomb Aduvanted(Arexvy) 03/09/2022   Tdap 10/15/2018   Zoster Recombinant(Shingrix) 03/21/2018, 07/20/2018   Zoster, Live 03/16/2014, 07/20/2018   CT Lung Screen - not qualified  Orders Placed This Encounter  Procedures   AMB referral to pulmonary rehabilitation    Referral Priority:   Routine    Referral Type:   Consultation    Number of Visits Requested:   1   Pulmonary function test    Standing Status:   Future    Standing Expiration Date:   05/21/2024    Order Specific Question:   Where should this test be performed?    Answer:   [facility]    Order Specific Question:   Full PFT: includes the following: basic spirometry, spirometry pre & post bronchodilator, diffusion capacity (DLCO), lung volumes    Answer:   Full PFT  No orders of the  defined types were placed in this encounter.   Return in about 3 months (around 08/20/2023) for PFT can be schedule any day. Follow-up in 3 months.  I have spent a total time of 45-minutes on the day of the appointment reviewing prior documentation, coordinating care and discussing medical diagnosis and plan with the patient/family. Imaging, labs and tests included in this note have been reviewed and interpreted independently by me.  Kareli Hossain Mechele Collin, MD [facility] 05/22/2023 9:45 AM

## 2023-05-22 NOTE — Patient Instructions (Addendum)
Shortness of breath Deconditioning --ORDER pulmonary function test. Schedule when next available --REFER pulmonary rehab. The program will contact you.

## 2023-05-24 ENCOUNTER — Telehealth (HOSPITAL_COMMUNITY): Payer: Self-pay

## 2023-05-24 ENCOUNTER — Other Ambulatory Visit: Payer: Self-pay

## 2023-05-24 DIAGNOSIS — H5213 Myopia, bilateral: Secondary | ICD-10-CM | POA: Diagnosis not present

## 2023-05-24 DIAGNOSIS — H2513 Age-related nuclear cataract, bilateral: Secondary | ICD-10-CM | POA: Diagnosis not present

## 2023-05-24 NOTE — Telephone Encounter (Signed)
Office referral recv'ed, printed and given to RN for review. 

## 2023-05-24 NOTE — Telephone Encounter (Signed)
Attempted to call patient in regards to Pulmonary Rehab - LM on VM °

## 2023-05-26 ENCOUNTER — Telehealth (HOSPITAL_COMMUNITY): Payer: Self-pay

## 2023-05-26 NOTE — Telephone Encounter (Signed)
Pt's wife Orinda Kenner and pt is interested in the Pulmonary Rehab Program. How ever he is going to call to verify as it entails exercise and she is not sure how he will feel about that. I Explained our scheduling process and went over insurance, patient verbalized understanding. Someone from our pulmonary rehab staff will contact pt at a later time.

## 2023-05-26 NOTE — Telephone Encounter (Signed)
Pt called and is interested in the Pulmonary rehab. He is worried that his knees will be a problem and he wont be able to participate. I did inform him they will take that into consideration before placing him on equipment!

## 2023-05-26 NOTE — Telephone Encounter (Signed)
Called Pt to Schedule for Pulmonary rehab. Pt is working on a time sensitive project per his wife Reece Levy who is on the [place]. He will call back around 4:15 today. I did inform them we leave around 4:30 and if he can not get through then we will call on Monday!

## 2023-05-26 NOTE — Telephone Encounter (Signed)
Pt returned PR phone call and stated he is not ready to start PR at this time. Per pt "maybe sometime in January", adv pt to contact PR when he is ready to schedule.   Closed referral

## 2023-05-30 ENCOUNTER — Other Ambulatory Visit: Payer: Self-pay

## 2023-05-31 ENCOUNTER — Other Ambulatory Visit: Payer: Self-pay

## 2023-05-31 NOTE — Progress Notes (Signed)
Specialty Pharmacy Refill Coordination Note  [name] is a 82 y.o. male contacted today regarding refills of specialty medication(s) Acalabrutinib Maleate   Patient requested Delivery   Delivery date: 06/07/23   Verified address: [address] [place] [address]   Medication will be filled on 06/06/23.

## 2023-05-31 NOTE — Progress Notes (Signed)
Specialty Pharmacy Ongoing Clinical Assessment Note  [name] is a 82 y.o. male who is being followed by the specialty pharmacy service for RxSp Oncology   Patient's specialty medication(s) reviewed today: Acalabrutinib Maleate   Missed doses in the last 4 weeks: 0   Patient/Caregiver did not have any additional questions or concerns.   Therapeutic benefit summary: Patient is achieving benefit   Adverse events/side effects summary: No adverse events/side effects   Patient's therapy is appropriate to: Continue    Goals Addressed             This Visit's Progress    Slow Disease Progression       Patient is on track. Patient will maintain adherence. Per provider note from 01/26/23 patient is hematologically stable.          Follow up:  6 months  Otto Herb Specialty Pharmacist

## 2023-06-06 ENCOUNTER — Other Ambulatory Visit: Payer: Self-pay

## 2023-06-27 ENCOUNTER — Other Ambulatory Visit: Payer: Self-pay | Admitting: Oncology

## 2023-06-27 ENCOUNTER — Other Ambulatory Visit (HOSPITAL_COMMUNITY): Payer: Self-pay | Admitting: Pharmacy Technician

## 2023-06-27 ENCOUNTER — Other Ambulatory Visit (HOSPITAL_COMMUNITY): Payer: Self-pay

## 2023-06-27 ENCOUNTER — Other Ambulatory Visit: Payer: Self-pay

## 2023-06-27 DIAGNOSIS — C9110 Chronic lymphocytic leukemia of B-cell type not having achieved remission: Secondary | ICD-10-CM

## 2023-06-27 MED ORDER — CALQUENCE 100 MG PO TABS
100.0000 mg | ORAL_TABLET | Freq: Every day | ORAL | 4 refills | Status: DC
Start: 2023-06-27 — End: 2023-12-11
  Filled 2023-06-27: qty 30, 30d supply, fill #0
  Filled 2023-08-02: qty 30, 30d supply, fill #1
  Filled 2023-08-31: qty 30, 30d supply, fill #2
  Filled 2023-10-05: qty 30, 30d supply, fill #3
  Filled 2023-11-06: qty 30, 30d supply, fill #4

## 2023-06-27 NOTE — Progress Notes (Signed)
 Specialty Pharmacy Refill Coordination Note  [name] is a 83 y.o. male contacted today regarding refills of specialty medication(s) Acalabrutinib  Maleate (Calquence )   Patient requested Delivery   Delivery date: 07/12/23   Verified address: Patient address [address]  [place]   Medication will be filled on 07/11/23.  Refill Request sent to MD; call if any delays

## 2023-06-28 DIAGNOSIS — R972 Elevated prostate specific antigen [PSA]: Secondary | ICD-10-CM | POA: Diagnosis not present

## 2023-06-30 DIAGNOSIS — I10 Essential (primary) hypertension: Secondary | ICD-10-CM | POA: Diagnosis not present

## 2023-06-30 DIAGNOSIS — N183 Chronic kidney disease, stage 3 unspecified: Secondary | ICD-10-CM | POA: Diagnosis not present

## 2023-06-30 DIAGNOSIS — N401 Enlarged prostate with lower urinary tract symptoms: Secondary | ICD-10-CM | POA: Diagnosis not present

## 2023-06-30 DIAGNOSIS — I2510 Atherosclerotic heart disease of native coronary artery without angina pectoris: Secondary | ICD-10-CM | POA: Diagnosis not present

## 2023-06-30 DIAGNOSIS — C9110 Chronic lymphocytic leukemia of B-cell type not having achieved remission: Secondary | ICD-10-CM | POA: Diagnosis not present

## 2023-06-30 DIAGNOSIS — I480 Paroxysmal atrial fibrillation: Secondary | ICD-10-CM | POA: Diagnosis not present

## 2023-06-30 DIAGNOSIS — R972 Elevated prostate specific antigen [PSA]: Secondary | ICD-10-CM | POA: Diagnosis not present

## 2023-06-30 DIAGNOSIS — R0602 Shortness of breath: Secondary | ICD-10-CM | POA: Diagnosis not present

## 2023-06-30 DIAGNOSIS — D6869 Other thrombophilia: Secondary | ICD-10-CM | POA: Diagnosis not present

## 2023-07-07 ENCOUNTER — Other Ambulatory Visit: Payer: Self-pay

## 2023-07-11 ENCOUNTER — Other Ambulatory Visit: Payer: Self-pay

## 2023-07-14 DIAGNOSIS — N183 Chronic kidney disease, stage 3 unspecified: Secondary | ICD-10-CM | POA: Diagnosis not present

## 2023-07-24 ENCOUNTER — Telehealth (HOSPITAL_COMMUNITY): Payer: Self-pay

## 2023-07-24 NOTE — Telephone Encounter (Signed)
Pt insurance is active and benefits verified through [facility]. Co-pay $15.00, DED $0.00/$0.00 met, out of pocket $4,000.00/$20.00 met, co-insurance 0%. No pre-authorization required. [facility] Medicare, 07/24/23 @ 2:40PM, [phone]

## 2023-07-24 NOTE — Telephone Encounter (Signed)
Pt called and stated he is now interested in PR. Patient will come in for orientation on 08/07/23 @ 10:30AM and will attend the 815AM exercise class.   Pensions consultant.

## 2023-07-27 DIAGNOSIS — M545 Low back pain, unspecified: Secondary | ICD-10-CM | POA: Diagnosis not present

## 2023-08-01 ENCOUNTER — Inpatient Hospital Stay: Payer: Medicare PPO

## 2023-08-01 ENCOUNTER — Other Ambulatory Visit: Payer: Medicare PPO

## 2023-08-01 ENCOUNTER — Inpatient Hospital Stay: Payer: Medicare PPO | Attending: Oncology | Admitting: Oncology

## 2023-08-01 VITALS — BP 145/68 | HR 60 | Temp 98.1°F | Resp 18 | Ht 64.0 in | Wt 164.7 lb

## 2023-08-01 DIAGNOSIS — Z79899 Other long term (current) drug therapy: Secondary | ICD-10-CM | POA: Insufficient documentation

## 2023-08-01 DIAGNOSIS — D696 Thrombocytopenia, unspecified: Secondary | ICD-10-CM | POA: Diagnosis not present

## 2023-08-01 DIAGNOSIS — I4891 Unspecified atrial fibrillation: Secondary | ICD-10-CM | POA: Diagnosis not present

## 2023-08-01 DIAGNOSIS — C9110 Chronic lymphocytic leukemia of B-cell type not having achieved remission: Secondary | ICD-10-CM | POA: Diagnosis not present

## 2023-08-01 DIAGNOSIS — Z7901 Long term (current) use of anticoagulants: Secondary | ICD-10-CM | POA: Diagnosis not present

## 2023-08-01 LAB — CMP (CANCER CENTER ONLY)
ALT: 11 U/L (ref 0–44)
AST: 14 U/L — ABNORMAL LOW (ref 15–41)
Albumin: 4.1 g/dL (ref 3.5–5.0)
Alkaline Phosphatase: 57 U/L (ref 38–126)
Anion gap: 5 (ref 5–15)
BUN: 27 mg/dL — ABNORMAL HIGH (ref 8–23)
CO2: 28 mmol/L (ref 22–32)
Calcium: 9.3 mg/dL (ref 8.9–10.3)
Chloride: 105 mmol/L (ref 98–111)
Creatinine: 1.53 mg/dL — ABNORMAL HIGH (ref 0.61–1.24)
GFR, Estimated: 45 mL/min — ABNORMAL LOW (ref >60)
Glucose, Bld: 94 mg/dL (ref 70–99)
Potassium: 4.2 mmol/L (ref 3.5–5.1)
Sodium: 138 mmol/L (ref 135–145)
Total Bilirubin: 0.7 mg/dL (ref 0.0–1.2)
Total Protein: 6.5 g/dL (ref 6.5–8.1)

## 2023-08-01 LAB — CBC WITH DIFFERENTIAL (CANCER CENTER ONLY)
Abs Immature Granulocytes: 0.04 10*3/uL (ref 0.00–0.07)
Basophils Absolute: 0.1 10*3/uL (ref 0.0–0.1)
Basophils Relative: 1 %
Eosinophils Absolute: 0.3 10*3/uL (ref 0.0–0.5)
Eosinophils Relative: 3 %
HCT: 42.2 % (ref 39.0–52.0)
Hemoglobin: 13.8 g/dL (ref 13.0–17.0)
Immature Granulocytes: 0 %
Lymphocytes Relative: 30 %
Lymphs Abs: 3.5 10*3/uL (ref 0.7–4.0)
MCH: 31.4 pg (ref 26.0–34.0)
MCHC: 32.7 g/dL (ref 30.0–36.0)
MCV: 96.1 fL (ref 80.0–100.0)
Monocytes Absolute: 0.7 10*3/uL (ref 0.1–1.0)
Monocytes Relative: 6 %
Neutro Abs: 7.1 10*3/uL (ref 1.7–7.7)
Neutrophils Relative %: 60 %
Platelet Count: 133 10*3/uL — ABNORMAL LOW (ref 150–400)
RBC: 4.39 MIL/uL (ref 4.22–5.81)
RDW: 12.8 % (ref 11.5–15.5)
WBC Count: 11.8 10*3/uL — ABNORMAL HIGH (ref 4.0–10.5)
nRBC: 0 % (ref 0.0–0.2)

## 2023-08-01 NOTE — Progress Notes (Signed)
[facility] OFFICE PROGRESS NOTE   Diagnosis: CLL  INTERVAL HISTORY:   [name] returns as scheduled.  He feels well.  No fever or night sweats.  No bleeding or arthralgias.  He walks daily.  He saw pulmonary medicine evaluate dyspnea.  He was diagnosed with deconditioning and has been referred for pulmonary rehabilitation. He has a callus between 2 toes on the right foot.  This is uncomfortable.  The callus has been present for several years.  He reports Dr. Kevan Ny shaved this area in the past. Objective:  Vital signs in last 24 hours:  Blood pressure (!) 145/68, pulse 60, temperature 98.1 F (36.7 C), temperature source Temporal, resp. rate 18, height 5\' 4"  (1.626 m), weight 164 lb 11.2 oz (74.7 kg), SpO2 100%.    Lymphatics: No cervical, supraclavicular, axillary, or inguinal nodes Resp: Lungs clear bilaterally Cardio: Regular rate and rhythm GI: No hepatomegaly Vascular: No leg edema  Skin: Nodular 1 cm callus between the right second and third toes.  No erythema or fluctuance  Portacath/PICC-without erythema  Lab Results:  Lab Results  Component Value Date   WBC 11.8 (H) 08/01/2023   HGB 13.8 08/01/2023   HCT 42.2 08/01/2023   MCV 96.1 08/01/2023   PLT 133 (L) 08/01/2023   NEUTROABS 7.1 08/01/2023    CMP  Lab Results  Component Value Date   NA 138 08/01/2023   K 4.2 08/01/2023   CL 105 08/01/2023   CO2 28 08/01/2023   GLUCOSE 94 08/01/2023   BUN 27 (H) 08/01/2023   CREATININE 1.53 (H) 08/01/2023   CALCIUM 9.3 08/01/2023   PROT 6.5 08/01/2023   ALBUMIN 4.1 08/01/2023   AST 14 (L) 08/01/2023   ALT 11 08/01/2023   ALKPHOS 57 08/01/2023   BILITOT 0.7 08/01/2023   GFRNONAA 45 (L) 08/01/2023   GFRAA 54 (L) 03/08/2020     Medications: I have reviewed the patient's current medications.   Assessment/Plan: CLL Review of the peripheral blood smear is consistent with chronic lymphocytic leukemia 12/16/2014 Peripheral blood flow cytometry  consistent with CLL 10/16/2018 MRI brain and cervical spine - Prominent bulky cervical adenopathy within the visualized neck, compatible with history of CLL. FISH analysis- gain of 13q; 17p and 11q not detected Acalabrutinib 11/22/2018 2. thrombocytopenia-likely secondary to chronic lymphocytic leukemia versus "pseudo thrombocytopenia" due to platelet clumping; progressive thrombocytopenia June 2018 Trial of prednisone started 10/17/2018 after admission with a fall/subarachnoid hemorrhage-starting dose 60 mg daily Prednisone tapered to 40 mg daily beginning 10/22/2018 Prednisone taper to 30 mg daily beginning 10/29/2018, tapered to off over 6 days beginning 11/13/2018 Bone marrow biopsy 11/09/2018-hypercellular marrow with prominent involvement by CLL, abundant megakaryocytes, lymphocytes represent 82% of all cells, the cytogenetics returned normal 3. Hospitalization with sepsis/UTI with bacteremia (Enterobacter aerogenes) March 2018 4. Hospitalization for syncopal episode resulting in fall. Developed a small right subarachnoid hemorrhage.  5. MRI brain 10/16/2018 - Focal 1-2 cm osseous lesions involving the right parietal and occipital calvarium as above, indeterminate. Findings are of uncertain significance, with no definite corresponding osseous lesion seen on prior CT. 6.  History of idiopathic "anaphylactoid "reactions 7.  Paroxysmal atrial fibrillation atrial fibrillation noted on a cardiac monitor 2020, not placed on anticoagulation secondary to thrombocytopenia 8.  Renal insufficiency          Disposition: [name] appears unchanged.  The platelet count is stable.  The hemoglobin is slightly lower, but remains in the normal range.  He is tolerating the acalabrutinib well.  He will continue acalabrutinib at the current dose.  He will return for an office and lab visit in 6 months.  He will continue follow-up with pulmonary rehab for dyspnea.  He is scheduled to see cardiology this  week.  I recommended he schedule a podiatry appointment to evaluate the right toe callus.  He has chronic renal insufficiency and is followed by Dr. Orson Aloe.   Thornton Papas, MD  08/01/2023  10:42 AM

## 2023-08-02 ENCOUNTER — Other Ambulatory Visit (HOSPITAL_COMMUNITY): Payer: Self-pay

## 2023-08-02 ENCOUNTER — Other Ambulatory Visit: Payer: Self-pay

## 2023-08-02 NOTE — Progress Notes (Signed)
Specialty Pharmacy Refill Coordination Note  [name] is a 83 y.o. male contacted today regarding refills of specialty medication(s) Acalabrutinib Maleate ([place]) Spoke with patient's wife [name]  Patient requested Delivery   Delivery date: 08/08/23   Verified address: Patient address [address]  [place]   Medication will be filled on 02.17.25.

## 2023-08-03 ENCOUNTER — Encounter (HOSPITAL_BASED_OUTPATIENT_CLINIC_OR_DEPARTMENT_OTHER): Payer: Self-pay

## 2023-08-04 ENCOUNTER — Ambulatory Visit (HOSPITAL_BASED_OUTPATIENT_CLINIC_OR_DEPARTMENT_OTHER): Payer: Medicare PPO | Admitting: Cardiovascular Disease

## 2023-08-04 VITALS — BP 142/62 | HR 56 | Ht 63.0 in | Wt 163.0 lb

## 2023-08-04 DIAGNOSIS — I480 Paroxysmal atrial fibrillation: Secondary | ICD-10-CM

## 2023-08-04 DIAGNOSIS — R0609 Other forms of dyspnea: Secondary | ICD-10-CM

## 2023-08-04 DIAGNOSIS — S066X5D Traumatic subarachnoid hemorrhage with loss of consciousness greater than 24 hours with return to pre-existing conscious level, subsequent encounter: Secondary | ICD-10-CM

## 2023-08-04 DIAGNOSIS — I10 Essential (primary) hypertension: Secondary | ICD-10-CM | POA: Diagnosis not present

## 2023-08-04 DIAGNOSIS — E782 Mixed hyperlipidemia: Secondary | ICD-10-CM | POA: Diagnosis not present

## 2023-08-04 DIAGNOSIS — I2510 Atherosclerotic heart disease of native coronary artery without angina pectoris: Secondary | ICD-10-CM

## 2023-08-04 DIAGNOSIS — C9110 Chronic lymphocytic leukemia of B-cell type not having achieved remission: Secondary | ICD-10-CM | POA: Diagnosis not present

## 2023-08-04 NOTE — Progress Notes (Signed)
 Cardiology Office Note:  .   Date:  08/23/2023  ID:  [name], DOB 04-23-1941, MRN [id number] PCP: [name] Aspen, MD  [facility] Providers Cardiologist:  [name] Si, MD Electrophysiologist:  Will Jorja Loa, MD    History of Present Illness: [name] is a 83 y.o. male with CAD, hypertension, hyperlipidemia, prior subarachnoid hemorrhage, and CLL here for follow-up.  He was previously a patient of Dr. Anne Fu and is now transferring due to location.  He had a small subarachnoid hemorrhage after an episode of syncope.  A loop recorder was implanted and no atrial fibrillation has been noted since 2020.  His last echo in 2020 revealed LVEF greater than 65% with a LVOT gradient of 25 mmHg.  When he last saw Dr. Anne Fu 03/2022 his CLL was stable.  He was walking 2 miles daily.  He did note some occasional shortness of breath.  He had a coronary CT-01/2019 that revealed occlusion of the distal RCA and nonobstructive disease in the LAD.  He has not been on aspirin due to the subarachnoid hemorrhage.  He saw Dr. Elberta Fortis 08/2022 for ILR removal but decided not to remove it.  [name] has experienced worsening shortness of breath with exertion over the past couple of years. A pulmonologist attributed it to deconditioning, and he is starting pulmonary rehabilitation. There is no associated chest pain or pressure. A CT scan four years ago showed some blockage in his heart arteries, which has been managed medically. He has not noticed any significant changes in symptoms since then.  He experienced a syncope episode on October 15, 2018, during a walk, which led to the implantation of a heart monitor. The monitor has been in place for three and a half years, showing normal results except for one brief incident of atrial fibrillation triggered by stress. He continues to walk most days, covering about a mile and three-quarters without needing to stop for breath.  His blood  pressure has been variable, with recent readings being slightly elevated. He is currently on olmesartan and metoprolol, with the latter being at a dose of 25 mg. He does not monitor his blood pressure at home and prefers not to acquire a home blood pressure cuff. He maintains a healthy diet, is conscious about salt intake, and his cholesterol levels were noted to be good in November.  He has a history of mildly abnormal kidney function, which has been stable since 2020. He plans to discuss this further with his primary care physician.     ROS:  As per HPI  Studies Reviewed: Marland Kitchen   EKG Interpretation Date/Time:  Friday August 04 2023 09:00:08 EST Ventricular Rate:  56 PR Interval:  160 QRS Duration:  94 QT Interval:  416 QTC Calculation: 401 R Axis:   54  Text Interpretation: Sinus bradycardia No significant change since last tracing Confirmed by [name] ([id number]) on 08/04/2023 9:17:53 AM     Risk Assessment/Calculations:        Physical Exam:   VS:  BP (!) 142/62   Pulse (!) 56   Ht 5\' 3"  (1.6 m)   Wt 163 lb (73.9 kg)   SpO2 99%   BMI 28.87 kg/m  , BMI Body mass index is 28.87 kg/m. GENERAL:  Well appearing HEENT: Pupils equal round and reactive, fundi not visualized, oral mucosa unremarkable NECK:  No jugular venous distention, waveform within normal limits, carotid upstroke brisk and symmetric, no bruits, no thyromegaly  LUNGS:  Clear to auscultation bilaterally HEART:  RRR.  PMI not displaced or sustained,S1 and S2 within normal limits, no S3, no S4, no clicks, no rubs, no murmurs ABD:  Flat, positive bowel sounds normal in frequency in pitch, no bruits, no rebound, no guarding, no midline pulsatile mass, no hepatomegaly, no splenomegaly EXT:  2 plus pulses throughout, no edema, no cyanosis no clubbing SKIN:  No rashes no nodules NEURO:  Cranial nerves II through XII grossly intact, motor grossly intact throughout PSYCH:  Cognitively intact, oriented to person place  and time   ASSESSMENT AND PLAN: .    # Hypertension Blood pressure elevated at today's visit (140/62). Patient is currently on Olmesartan and Metoprolol 25mg . Discussed the importance of regular blood pressure monitoring and the potential need for additional antihypertensive medication. -Encouraged patient to monitor blood pressure at home and bring readings to next visit. -Consider addition of another antihypertensive medication based on home readings.  # Shortness of Breath Patient reports worsening shortness of breath over the past couple of years. No chest pain or pressure. Patient is currently undergoing pulmonary rehab as recommended by pulmonologist. -Order exercise stress test to rule out progression of coronary artery disease as a cause for worsening shortness of breath. -Continue pulmonary rehab.  # Atrial Fibrillation Patient has a history of AFib with one brief incident noted on heart monitor over the past 3.5 years. No recent episodes reported.  He has not been on anticoagulation.  -Continue current management.  # Coronary Artery Disease Patient has known CAD which has been  managed medically. No recent chest pain or pressure reported.  However, given his exertional dyspnea we will get a stress test as above.  -Continue current management. -Consider repeat coronary CT scan if exercise stress test is abnormal.  Follow-up in 1 year, or sooner if stress test results are abnormal.      Signed, [name] Si, MD

## 2023-08-04 NOTE — Patient Instructions (Addendum)
Medication Instructions:  Your physician recommends that you continue on your current medications as directed. Please refer to the Current Medication list given to you today.   *If you need a refill on your cardiac medications before your next appointment, please call your pharmacy*  Lab Work: NONE   Testing/Procedures: Your physician has requested that you have en exercise stress myoview. For further information please visit [email or web address] Please follow instruction sheet, as given. NO METOPROLOL MORNING OF TREADMILL TEST   Follow-Up: At [facility], you and your health needs are our priority.  As part of our continuing mission to provide you with exceptional heart care, we have created designated Provider Care Teams.  These Care Teams include your primary Cardiologist (physician) and Advanced Practice Providers (APPs -  Physician Assistants and Nurse Practitioners) who all work together to provide you with the care you need, when you need it.  We recommend signing up for the patient portal called "MyChart".  Sign up information is provided on this After Visit Summary.  MyChart is used to connect with patients for Virtual Visits (Telemedicine).  Patients are able to view lab/test results, encounter notes, upcoming appointments, etc.  Non-urgent messages can be sent to your provider as well.   To learn more about what you can do with MyChart, go to [email or web address].    Your next appointment:   12 month(s)  Provider:   Chilton Si, MD or Gillian Shields, NP    Other Instructions MONITOR AND LOG YOUR BLOOD PRESSURE. FOLLOW UP WITH YOUR PRIMARY CARE REGARDING READINGS  Email for [employer] interviews: [email or web address]   Your Physician has requested you have a Myocardial Perfusion Imaging Study.   Please arrive 15 minutes prior to your appointment time for registration and insurance purposes.   The test will take approximately 3 to 4 hours to  complete; you may bring reading material. If someone comes with you to your appointment, they will need to remain in the main lobby due to limited testing space in the testing area. **If you are pregnant or breastfeeding, please notify the nuclear lab prior to your appointment**   How to prepare for your test:  - Do not eat or drink 3 hours prior to your test, except you may have water  - Do not consume products containing caffeine ( regular or decaf) 12 hours prior to your test ( coffee, chocolate, sodas or teas)  - Do bring a list of your current medications with you. If not listed below, you may take your medications as normal (Hold beta blocker- 24 hour prior for exercise myoview)   - Do wear comfortable clothes (no dresses or overalls) and walking shoes ( tennis shoes preferred), no heel or open toe shoes are allowed - Do not wear cologne, perfume, aftershave, or lotions ( deodorant is allowed)  - If these instructions are not followed, your test will be rescheduled   If you cannot keep your appointment, please provide 24 hours notice to the Nuclear Lab, to avoid a possible $50 charge to your account!

## 2023-08-07 ENCOUNTER — Encounter (HOSPITAL_COMMUNITY)
Admission: RE | Admit: 2023-08-07 | Discharge: 2023-08-07 | Disposition: A | Discharge status: Home / Self Care | Payer: Medicare PPO | Source: Ambulatory Visit | Attending: Pulmonary Disease | Admitting: Pulmonary Disease

## 2023-08-07 VITALS — BP 124/72 | HR 59 | Ht 63.0 in | Wt 165.6 lb

## 2023-08-07 DIAGNOSIS — R0602 Shortness of breath: Secondary | ICD-10-CM | POA: Diagnosis not present

## 2023-08-07 NOTE — Progress Notes (Signed)
Pulmonary Rehab Orientation Physical Assessment Note    Well appearing, A&Ox4, NAD Eyes/Ears: wears glasses, denies being HOH Lungs: Clear with no wheezes, rales, rhonchi, denies chronic cough, dyspnea on exertion Heart: Regular rate rhythm, no murmurs, no rubs, no clicks Gastrointestinal: abdomin soft, + bowel sounds in all 4 quads, denies recent weight gain or loss, endorses normal BMs Genitourinary: WNL, pt denies s/s Extremities:  +2 pulses, grip strength equal, strong, no edema, no cyanosis, no clubbing Integumentary: pt denies any rashes, open or non healing wounds Psy/Soc: pt denies any needs Assistive devices: None

## 2023-08-07 NOTE — Progress Notes (Signed)
Pulmonary Individual Treatment Plan  Patient Details  Name: [name] MRN: [id number] Date of Birth: 1941-01-17 Referring Provider:   Doristine Devoid Pulmonary Rehab Walk Test from 08/07/2023 in [facility]  Referring Provider Everardo All       Initial Encounter Date:  Flowsheet Row Pulmonary Rehab Walk Test from 08/07/2023 in [facility]  Date 08/07/23       Visit Diagnosis: Shortness of breath  Patient's Home Medications on Admission:   Current Outpatient Medications:    acalabrutinib maleate (CALQUENCE) 100 MG tablet, Take 1 tablet (100 mg) by mouth daily., Disp: 30 tablet, Rfl: 4   acetaminophen (TYLENOL) 325 MG tablet, Take 325 mg by mouth daily as needed for headache (pain)., Disp: , Rfl:    cholecalciferol (VITAMIN D3) 25 MCG (1000 UNIT) tablet, Take 1,000 Units by mouth daily. , Disp: , Rfl:    COVID-19 mRNA vaccine 2023-2024 (COMIRNATY) syringe, Inject 0.3 mLs into the muscle daily., Disp: 0.3 mL, Rfl: 0   EPINEPHrine (EPIPEN) 0.3 mg/0.3 mL DEVI, Inject 0.3 mLs (0.3 mg total) into the muscle once., Disp: 1 Device, Rfl: 0   influenza vaccine adjuvanted (FLUAD) 0.5 ML injection, Inject into the muscle., Disp: 0.5 mL, Rfl: 0   metoprolol succinate (TOPROL-XL) 25 MG 24 hr tablet, TAKE 1 TABLET(25 MG) BY MOUTH DAILY, Disp: 90 tablet, Rfl: 2   olmesartan (BENICAR) 40 MG tablet, Take 40 mg by mouth daily., Disp: , Rfl:    simvastatin (ZOCOR) 40 MG tablet, Take 20 mg by mouth daily. , Disp: , Rfl:    clobetasol cream (TEMOVATE) 0.05 %, Apply 1 Application topically as needed. (Patient not taking: Reported on 08/07/2023), Disp: , Rfl:   Past Medical History: Past Medical History:  Diagnosis Date   Acute encephalopathy 08/17/2016   Acute renal failure superimposed on stage 3 chronic kidney disease (HCC) 10/15/2018   AKI (acute kidney injury) (HCC) 08/16/2016    Arthritis    Bacteremia due to Enterobacter species 08/19/2016   Carpal tunnel syndrome, bilateral 10/09/2017   Chest tightness 10/15/2018   CLL (chronic lymphocytic leukemia) (HCC) 12/16/2014   Dyspnea 09/20/2012   Elevated troponin 10/16/2018   Enterobacter sepsis (HCC) 08/19/2016   Fall 10/15/2018   GERD (gastroesophageal reflux disease)    Hepatitis 1966   Drug reaction after taking medication   Hyperglycemia    Hyperlipemia    Hypertension    Hypokalemia 08/16/2016   Lung nodule seen on imaging study    bilateral lungs   Nasal bone fracture 10/15/2018   Right foot pain    SAH (subarachnoid hemorrhage) (HCC) 10/15/2018   Syncope 10/15/2018   Thrombocytopenia (HCC) 10/15/2018   Urinary tract infection with hematuria     Tobacco Use: Social History   Tobacco Use  Smoking Status Never  Smokeless Tobacco Never    Labs: Review Flowsheet       Latest Ref Rng & Units 02/03/2011 08/16/2016 10/16/2018  Labs for ITP Cardiac and Pulmonary Rehab  Cholestrol 0 - 200 mg/dL - - 865   LDL (calc) 0 - 99 mg/dL - - 61   HDL-C >78 mg/dL - - 20   Trlycerides <469 mg/dL - - 629   Hemoglobin B2W 4.8 - 5.6 % - - 5.4   PH, Arterial 7.350 - 7.450 - 7.501  -  PCO2 arterial 32.0 - 48.0 mmHg - 25.8  -  Bicarbonate 20.0 - 28.0  mmol/L - 20.0  -  TCO2 0 - 100 mmol/L 23  - -  Acid-base deficit 0.0 - 2.0 mmol/L - 1.7  -  O2 Saturation % - 95.3  -    Capillary Blood Glucose: Lab Results  Component Value Date   GLUCAP 79 10/19/2018   GLUCAP 90 10/19/2018   GLUCAP 88 10/16/2018   GLUCAP 101 (H) 10/16/2018     Pulmonary Assessment Scores:  Pulmonary Assessment Scores     Row Name 08/07/23 1044         ADL UCSD   ADL Phase Entry     SOB Score total 10       CAT Score   CAT Score 4       mMRC Score   mMRC Score 0             UCSD: Self-administered rating of dyspnea associated with activities of daily living (ADLs) 6-point scale (0 = "not at all" to 5 = "maximal or unable to do  because of breathlessness")  Scoring Scores range from 0 to 120.  Minimally important difference is 5 units  CAT: CAT can identify the health impairment of COPD patients and is better correlated with disease progression.  CAT has a scoring range of zero to 40. The CAT score is classified into four groups of low (less than 10), medium (10 - 20), high (21-30) and very high (31-40) based on the impact level of disease on health status. A CAT score over 10 suggests significant symptoms.  A worsening CAT score could be explained by an exacerbation, poor medication adherence, poor inhaler technique, or progression of COPD or comorbid conditions.  CAT MCID is 2 points  mMRC: mMRC (Modified Medical Research Council) Dyspnea Scale is used to assess the degree of baseline functional disability in patients of respiratory disease due to dyspnea. No minimal important difference is established. A decrease in score of 1 point or greater is considered a positive change.   Pulmonary Function Assessment:  Pulmonary Function Assessment - 08/07/23 1044       Breath   Shortness of Breath Yes;Limiting activity             Exercise Target Goals: Exercise Program Goal: Individual exercise prescription set using results from initial 6 min walk test and THRR while considering  patient's activity barriers and safety.   Exercise Prescription Goal: Initial exercise prescription builds to 30-45 minutes a day of aerobic activity, 2-3 days per week.  Home exercise guidelines will be given to patient during program as part of exercise prescription that the participant will acknowledge.  Activity Barriers & Risk Stratification:  Activity Barriers & Cardiac Risk Stratification - 08/07/23 1044       Activity Barriers & Cardiac Risk Stratification   Activity Barriers Arthritis;Muscular Weakness;Deconditioning;Joint Problems             6 Minute Walk:  6 Minute Walk     Row Name 08/07/23 1139         6  Minute Walk   Phase Initial     Distance 1410 feet     Walk Time 6 minutes     # of Rest Breaks 0     MPH 2.67     METS 2.05     RPE 9     Perceived Dyspnea  0.5     VO2 Peak 7.18     Symptoms No     Resting HR 59 bpm  Resting BP 124/72     Resting Oxygen Saturation  100 %     Exercise Oxygen Saturation  during 6 min walk 98 %     Max Ex. HR 74 bpm     Max Ex. BP 138/72     2 Minute Post BP 122/70       Interval HR   1 Minute HR 71     2 Minute HR 72     3 Minute HR 72     4 Minute HR 72     5 Minute HR 73     6 Minute HR 74     2 Minute Post HR 52     Interval Heart Rate? Yes       Interval Oxygen   Interval Oxygen? Yes     Baseline Oxygen Saturation % 100 %     1 Minute Oxygen Saturation % 99 %     1 Minute Liters of Oxygen 0 L     2 Minute Oxygen Saturation % 99 %     2 Minute Liters of Oxygen 0 L     3 Minute Oxygen Saturation % 100 %     3 Minute Liters of Oxygen 0 L     4 Minute Oxygen Saturation % 98 %     4 Minute Liters of Oxygen 0 L     5 Minute Oxygen Saturation % 100 %     5 Minute Liters of Oxygen 0 L     6 Minute Oxygen Saturation % 100 %     6 Minute Liters of Oxygen 0 L     2 Minute Post Oxygen Saturation % 100 %     2 Minute Post Liters of Oxygen 0 L              Oxygen Initial Assessment:  Oxygen Initial Assessment - 08/07/23 1043       Home Oxygen   Home Oxygen Device None    Sleep Oxygen Prescription None    Home Exercise Oxygen Prescription None    Home Resting Oxygen Prescription None      Initial 6 min Walk   Oxygen Used None      Program Oxygen Prescription   Program Oxygen Prescription None      Intervention   Short Term Goals To learn and understand importance of monitoring SPO2 with pulse oximeter and demonstrate accurate use of the pulse oximeter.;To learn and understand importance of maintaining oxygen saturations>88%;To learn and demonstrate proper pursed lip breathing techniques or other breathing techniques. ;To  learn and demonstrate proper use of respiratory medications    Long  Term Goals Verbalizes importance of monitoring SPO2 with pulse oximeter and return demonstration;Maintenance of O2 saturations>88%;Exhibits proper breathing techniques, such as pursed lip breathing or other method taught during program session;Compliance with respiratory medication;Demonstrates proper use of MDI's             Oxygen Re-Evaluation:   Oxygen Discharge (Final Oxygen Re-Evaluation):   Initial Exercise Prescription:  Initial Exercise Prescription - 08/07/23 1100       Date of Initial Exercise RX and Referring Provider   Date 08/07/23    Referring Provider Everardo All    Expected Discharge Date 11/02/23      Treadmill   MPH 1.8    Grade 0    Minutes 15      Recumbant Elliptical   Level 1    Minutes 15    METs 2  Prescription Details   Frequency (times per week) 2    Duration Progress to 30 minutes of continuous aerobic without signs/symptoms of physical distress      Intensity   THRR 40-80% of Max Heartrate 55-110    Ratings of Perceived Exertion 11-13    Perceived Dyspnea 0-4      Progression   Progression Continue to progress workloads to maintain intensity without signs/symptoms of physical distress.      Resistance Training   Training Prescription Yes    Weight blue bands    Reps 10-15             Perform Capillary Blood Glucose checks as needed.  Exercise Prescription Changes:   Exercise Comments:   Exercise Goals and Review:   Exercise Goals     Row Name 08/07/23 1151             Exercise Goals   Increase Physical Activity Yes       Intervention Provide advice, education, support and counseling about physical activity/exercise needs.;Develop an individualized exercise prescription for aerobic and resistive training based on initial evaluation findings, risk stratification, comorbidities and participant's personal goals.       Expected Outcomes Short Term:  Attend rehab on a regular basis to increase amount of physical activity.;Long Term: Exercising regularly at least 3-5 days a week.;Long Term: Add in home exercise to make exercise part of routine and to increase amount of physical activity.       Increase Strength and Stamina Yes       Intervention Provide advice, education, support and counseling about physical activity/exercise needs.;Develop an individualized exercise prescription for aerobic and resistive training based on initial evaluation findings, risk stratification, comorbidities and participant's personal goals.       Expected Outcomes Short Term: Increase workloads from initial exercise prescription for resistance, speed, and METs.;Short Term: Perform resistance training exercises routinely during rehab and add in resistance training at home;Long Term: Improve cardiorespiratory fitness, muscular endurance and strength as measured by increased METs and functional capacity ( )       Able to understand and use rate of perceived exertion (RPE) scale Yes       Intervention Provide education and explanation on how to use RPE scale       Expected Outcomes Short Term: Able to use RPE daily in rehab to express subjective intensity level;Long Term:  Able to use RPE to guide intensity level when exercising independently       Able to understand and use Dyspnea scale Yes       Intervention Provide education and explanation on how to use Dyspnea scale       Expected Outcomes Short Term: Able to use Dyspnea scale daily in rehab to express subjective sense of shortness of breath during exertion;Long Term: Able to use Dyspnea scale to guide intensity level when exercising independently       Knowledge and understanding of Target Heart Rate Range (THRR) Yes       Intervention Provide education and explanation of THRR including how the numbers were predicted and where they are located for reference       Expected Outcomes Short Term: Able to state/look up  THRR;Long Term: Able to use THRR to govern intensity when exercising independently;Short Term: Able to use daily as guideline for intensity in rehab       Understanding of Exercise Prescription Yes       Intervention Provide education, explanation, and written materials on patient's  individual exercise prescription       Expected Outcomes Short Term: Able to explain program exercise prescription;Long Term: Able to explain home exercise prescription to exercise independently                Exercise Goals Re-Evaluation :   Discharge Exercise Prescription (Final Exercise Prescription Changes):   Nutrition:  Target Goals: Understanding of nutrition guidelines, daily intake of sodium 1500mg , cholesterol 200mg , calories 30% from fat and 7% or less from saturated fats, daily to have 5 or more servings of fruits and vegetables.  Biometrics:    Nutrition Therapy Plan and Nutrition Goals:   Nutrition Assessments:  MEDIFICTS Score Key: >=70 Need to make dietary changes  40-70 Heart Healthy Diet <= 40 Therapeutic Level Cholesterol Diet   Picture Your Plate Scores: <95 Unhealthy dietary pattern with much room for improvement. 41-50 Dietary pattern unlikely to meet recommendations for good health and room for improvement. 51-60 More healthful dietary pattern, with some room for improvement.  >60 Healthy dietary pattern, although there may be some specific behaviors that could be improved.    Nutrition Goals Re-Evaluation:   Nutrition Goals Discharge (Final Nutrition Goals Re-Evaluation):   Psychosocial: Target Goals: Acknowledge presence or absence of significant depression and/or stress, maximize coping skills, provide positive support system. Participant is able to verbalize types and ability to use techniques and skills needed for reducing stress and depression.  Initial Review & Psychosocial Screening:  Initial Psych Review & Screening - 08/07/23 1038       Initial  Review   Current issues with None Identified      Family Dynamics   Good Support System? Yes    Comments spouse, daughter      Barriers   Psychosocial barriers to participate in program There are no identifiable barriers or psychosocial needs.      Screening Interventions   Interventions Encouraged to exercise             Quality of Life Scores:  Scores of 19 and below usually indicate a poorer quality of life in these areas.  A difference of  2-3 points is a clinically meaningful difference.  A difference of 2-3 points in the total score of the Quality of Life Index has been associated with significant improvement in overall quality of life, self-image, physical symptoms, and general health in studies assessing change in quality of life.  PHQ-9: Review Flowsheet       08/07/2023  Depression screen PHQ 2/9  Decreased Interest 0  Down, Depressed, Hopeless 0  PHQ - 2 Score 0  Altered sleeping 0  Tired, decreased energy 0  Change in appetite 0  Feeling bad or failure about yourself  0  Trouble concentrating 0  Moving slowly or fidgety/restless 0  Suicidal thoughts 0  PHQ-9 Score 0  Difficult doing work/chores Not difficult at all   Interpretation of Total Score  Total Score Depression Severity:  1-4 = Minimal depression, 5-9 = Mild depression, 10-14 = Moderate depression, 15-19 = Moderately severe depression, 20-27 = Severe depression   Psychosocial Evaluation and Intervention:  Psychosocial Evaluation - 08/07/23 1039       Psychosocial Evaluation & Interventions   Interventions Encouraged to exercise with the program and follow exercise prescription    Comments Maddock denies any psychosocial barriers at this time.    Expected Outcomes For Dink to participate in rehab free of psychosocial concerns.    Continue Psychosocial Services  No Follow up required  Psychosocial Re-Evaluation:   Psychosocial Discharge (Final Psychosocial  Re-Evaluation):   Education: Education Goals: Education classes will be provided on a weekly basis, covering required topics. Participant will state understanding/return demonstration of topics presented.  Learning Barriers/Preferences:  Learning Barriers/Preferences - 08/07/23 1040       Learning Barriers/Preferences   Learning Barriers Sight    Learning Preferences None             Education Topics: Know Your Numbers Group instruction that is supported by a PowerPoint presentation. Instructor discusses importance of knowing and understanding resting, exercise, and post-exercise oxygen saturation, heart rate, and blood pressure. Oxygen saturation, heart rate, blood pressure, rating of perceived exertion, and dyspnea are reviewed along with a normal range for these values.    Exercise for the Pulmonary Patient Group instruction that is supported by a PowerPoint presentation. Instructor discusses benefits of exercise, core components of exercise, frequency, duration, and intensity of an exercise routine, importance of utilizing pulse oximetry during exercise, safety while exercising, and options of places to exercise outside of rehab.    MET Level  Group instruction provided by PowerPoint, verbal discussion, and written material to support subject matter. Instructor reviews what METs are and how to increase METs.    Pulmonary Medications Verbally interactive group education provided by instructor with focus on inhaled medications and proper administration.   Anatomy and Physiology of the Respiratory System Group instruction provided by PowerPoint, verbal discussion, and written material to support subject matter. Instructor reviews respiratory cycle and anatomical components of the respiratory system and their functions. Instructor also reviews differences in obstructive and restrictive respiratory diseases with examples of each.    Oxygen Safety Group instruction provided by  PowerPoint, verbal discussion, and written material to support subject matter. There is an overview of "What is Oxygen" and "Why do we need it".  Instructor also reviews how to create a safe environment for oxygen use, the importance of using oxygen as prescribed, and the risks of noncompliance. There is a brief discussion on traveling with oxygen and resources the patient may utilize.   Oxygen Use Group instruction provided by PowerPoint, verbal discussion, and written material to discuss how supplemental oxygen is prescribed and different types of oxygen supply systems. Resources for more information are provided.    Breathing Techniques Group instruction that is supported by demonstration and informational handouts. Instructor discusses the benefits of pursed lip and diaphragmatic breathing and detailed demonstration on how to perform both.     Risk Factor Reduction Group instruction that is supported by a PowerPoint presentation. Instructor discusses the definition of a risk factor, different risk factors for pulmonary disease, and how the heart and lungs work together.   Pulmonary Diseases Group instruction provided by PowerPoint, verbal discussion, and written material to support subject matter. Instructor gives an overview of the different type of pulmonary diseases. There is also a discussion on risk factors and symptoms as well as ways to manage the diseases.   Stress and Energy Conservation Group instruction provided by PowerPoint, verbal discussion, and written material to support subject matter. Instructor gives an overview of stress and the impact it can have on the body. Instructor also reviews ways to reduce stress. There is also a discussion on energy conservation and ways to conserve energy throughout the day.   Warning Signs and Symptoms Group instruction provided by PowerPoint, verbal discussion, and written material to support subject matter. Instructor reviews warning  signs and symptoms of stroke, heart attack, cold and  flu. Instructor also reviews ways to prevent the spread of infection.   Other Education Group or individual verbal, written, or video instructions that support the educational goals of the pulmonary rehab program.    Knowledge Questionnaire Score:  Knowledge Questionnaire Score - 08/07/23 1146       Knowledge Questionnaire Score   Pre Score 10/18             Core Components/Risk Factors/Patient Goals at Admission:  Personal Goals and Risk Factors at Admission - 08/07/23 1043       Core Components/Risk Factors/Patient Goals on Admission   Improve shortness of breath with ADL's Yes    Intervention Provide education, individualized exercise plan and daily activity instruction to help decrease symptoms of SOB with activities of daily living.    Expected Outcomes Short Term: Improve cardiorespiratory fitness to achieve a reduction of symptoms when performing ADLs;Long Term: Be able to perform more ADLs without symptoms or delay the onset of symptoms             Core Components/Risk Factors/Patient Goals Review:    Core Components/Risk Factors/Patient Goals at Discharge (Final Review):    ITP Comments: Dr. Mechele Collin is Medical Director for Pulmonary Rehab at [facility].

## 2023-08-07 NOTE — Progress Notes (Signed)
[name] 83 y.o. male Pulmonary Rehab Orientation Note This patient who was referred to Pulmonary Rehab by Dr. Everardo All with the diagnosis of SOB arrived today in Cardiac and Pulmonary Rehab. She  arrived ambulatory with normal gait. He does not carry portable oxygen. Per patient, [name] uses oxygen never. Color good, skin warm and dry. Patient is oriented to time and place. Patient's medical history, psychosocial health, and medications reviewed. Psychosocial assessment reveals patient lives with spouse. [name] is currently retired. Patient hobbies include reading and spending time with friends. Patient reports his stress level is low. Areas of stress/anxiety include N/A. Patient does not exhibit signs of depression. PHQ2/9 score 0/0. [name] shows good  coping skills with positive outlook on life. Offered emotional support and reassurance. Will continue to monitor. Physical assessment performed by [name] Hart RN. Please see their orientation physical assessment note. [name] reports he  does take medications as prescribed. Patient states he  follows a regular  diet. The patient reports no specific efforts to gain or lose weight.. Patient's weight will be monitored closely. Demonstration and practice of PLB using pulse oximeter. [name] able to return demonstration satisfactorily. Safety and hand hygiene in the exercise area reviewed with patient. [name] voices understanding of the information reviewed. Department expectations discussed with patient and achievable goals were set. The patient shows enthusiasm about attending the program and we look forward to working with [name]. [name] completed a 6 min walk test today and is scheduled to begin exercise on 08/17/23 at 8:15 am.   [phone] Joya San, MS, ACSM-CEP

## 2023-08-09 ENCOUNTER — Telehealth (HOSPITAL_COMMUNITY): Payer: Self-pay | Admitting: *Deleted

## 2023-08-09 NOTE — Telephone Encounter (Signed)
Patient given detailed instructions per Myocardial Perfusion Study Information Sheet for the test on 08/13/2022 at 10:15. Patient notified to arrive 15 minutes early and that it is imperative to arrive on time for appointment to keep from having the test rescheduled.  If you need to cancel or reschedule your appointment, please call the office within 24 hours of your appointment. . Patient verbalized understanding.[name]

## 2023-08-09 NOTE — Progress Notes (Signed)
Pulmonary Individual Treatment Plan  Patient Details  Name: NASHAWN HILLOCK MRN: [id number] Date of Birth: [birth year] Referring Provider:   Doristine Devoid Pulmonary Rehab Walk Test from 08/07/2023 in [facility]  Referring Provider Everardo All       Initial Encounter Date:  Flowsheet Row Pulmonary Rehab Walk Test from 08/07/2023 in [facility]  Date 08/07/23       Visit Diagnosis: Shortness of breath  Patient's Home Medications on Admission:   Current Outpatient Medications:    acalabrutinib maleate (CALQUENCE) 100 MG tablet, Take 1 tablet (100 mg) by mouth daily., Disp: 30 tablet, Rfl: 4   acetaminophen (TYLENOL) 325 MG tablet, Take 325 mg by mouth daily as needed for headache (pain)., Disp: , Rfl:    cholecalciferol (VITAMIN D3) 25 MCG (1000 UNIT) tablet, Take 1,000 Units by mouth daily. , Disp: , Rfl:    COVID-19 mRNA vaccine 2023-2024 (COMIRNATY) syringe, Inject 0.3 mLs into the muscle daily., Disp: 0.3 mL, Rfl: 0   EPINEPHrine (EPIPEN) 0.3 mg/0.3 mL DEVI, Inject 0.3 mLs (0.3 mg total) into the muscle once., Disp: 1 Device, Rfl: 0   influenza vaccine adjuvanted (FLUAD) 0.5 ML injection, Inject into the muscle., Disp: 0.5 mL, Rfl: 0   metoprolol succinate (TOPROL-XL) 25 MG 24 hr tablet, TAKE 1 TABLET(25 MG) BY MOUTH DAILY, Disp: 90 tablet, Rfl: 2   olmesartan (BENICAR) 40 MG tablet, Take 40 mg by mouth daily., Disp: , Rfl:    simvastatin (ZOCOR) 40 MG tablet, Take 20 mg by mouth daily. , Disp: , Rfl:    clobetasol cream (TEMOVATE) 0.05 %, Apply 1 Application topically as needed. (Patient not taking: Reported on 08/07/2023), Disp: , Rfl:   Past Medical History: Past Medical History:  Diagnosis Date   Acute encephalopathy 08/17/2016   Acute renal failure superimposed on stage 3 chronic kidney disease (HCC) 10/15/2018   AKI (acute kidney injury) (HCC) 08/16/2016    Arthritis    Bacteremia due to Enterobacter species 08/19/2016   Carpal tunnel syndrome, bilateral 10/09/2017   Chest tightness 10/15/2018   CLL (chronic lymphocytic leukemia) (HCC) 12/16/2014   Dyspnea 09/20/2012   Elevated troponin 10/16/2018   Enterobacter sepsis (HCC) 08/19/2016   Fall 10/15/2018   GERD (gastroesophageal reflux disease)    Hepatitis 1966   Drug reaction after taking medication   Hyperglycemia    Hyperlipemia    Hypertension    Hypokalemia 08/16/2016   Lung nodule seen on imaging study    bilateral lungs   Nasal bone fracture 10/15/2018   Right foot pain    SAH (subarachnoid hemorrhage) (HCC) 10/15/2018   Syncope 10/15/2018   Thrombocytopenia (HCC) 10/15/2018   Urinary tract infection with hematuria     Tobacco Use: Social History   Tobacco Use  Smoking Status Never  Smokeless Tobacco Never    Labs: Review Flowsheet       Latest Ref Rng & Units 02/03/2011 08/16/2016 10/16/2018  Labs for ITP Cardiac and Pulmonary Rehab  Cholestrol 0 - 200 mg/dL - - 086   LDL (calc) 0 - 99 mg/dL - - 61   HDL-C >57 mg/dL - - 20   Trlycerides <846 mg/dL - - 962   Hemoglobin X5M 4.8 - 5.6 % - - 5.4   PH, Arterial 7.350 - 7.450 - 7.501  -  PCO2 arterial 32.0 - 48.0 mmHg - 25.8  -  Bicarbonate 20.0 - 28.0  mmol/L - 20.0  -  TCO2 0 - 100 mmol/L 23  - -  Acid-base deficit 0.0 - 2.0 mmol/L - 1.7  -  O2 Saturation % - 95.3  -    Capillary Blood Glucose: Lab Results  Component Value Date   GLUCAP 79 10/19/2018   GLUCAP 90 10/19/2018   GLUCAP 88 10/16/2018   GLUCAP 101 (H) 10/16/2018     Pulmonary Assessment Scores:  Pulmonary Assessment Scores     Row Name 08/07/23 1044         ADL UCSD   ADL Phase Entry     SOB Score total 10       CAT Score   CAT Score 4       mMRC Score   mMRC Score 0             UCSD: Self-administered rating of dyspnea associated with activities of daily living (ADLs) 6-point scale (0 = "not at all" to 5 = "maximal or unable to do  because of breathlessness")  Scoring Scores range from 0 to 120.  Minimally important difference is 5 units  CAT: CAT can identify the health impairment of COPD patients and is better correlated with disease progression.  CAT has a scoring range of zero to 40. The CAT score is classified into four groups of low (less than 10), medium (10 - 20), high (21-30) and very high (31-40) based on the impact level of disease on health status. A CAT score over 10 suggests significant symptoms.  A worsening CAT score could be explained by an exacerbation, poor medication adherence, poor inhaler technique, or progression of COPD or comorbid conditions.  CAT MCID is 2 points  mMRC: mMRC (Modified Medical Research Council) Dyspnea Scale is used to assess the degree of baseline functional disability in patients of respiratory disease due to dyspnea. No minimal important difference is established. A decrease in score of 1 point or greater is considered a positive change.   Pulmonary Function Assessment:  Pulmonary Function Assessment - 08/07/23 1044       Breath   Shortness of Breath Yes;Limiting activity             Exercise Target Goals: Exercise Program Goal: Individual exercise prescription set using results from initial 6 min walk test and THRR while considering  patient's activity barriers and safety.   Exercise Prescription Goal: Initial exercise prescription builds to 30-45 minutes a day of aerobic activity, 2-3 days per week.  Home exercise guidelines will be given to patient during program as part of exercise prescription that the participant will acknowledge.  Activity Barriers & Risk Stratification:  Activity Barriers & Cardiac Risk Stratification - 08/07/23 1044       Activity Barriers & Cardiac Risk Stratification   Activity Barriers Arthritis;Muscular Weakness;Deconditioning;Joint Problems             6 Minute Walk:  6 Minute Walk     Row Name 08/07/23 1139         6  Minute Walk   Phase Initial     Distance 1410 feet     Walk Time 6 minutes     # of Rest Breaks 0     MPH 2.67     METS 2.05     RPE 9     Perceived Dyspnea  0.5     VO2 Peak 7.18     Symptoms No     Resting HR 59 bpm  Resting BP 124/72     Resting Oxygen Saturation  100 %     Exercise Oxygen Saturation  during 6 min walk 98 %     Max Ex. HR 74 bpm     Max Ex. BP 138/72     2 Minute Post BP 122/70       Interval HR   1 Minute HR 71     2 Minute HR 72     3 Minute HR 72     4 Minute HR 72     5 Minute HR 73     6 Minute HR 74     2 Minute Post HR 52     Interval Heart Rate? Yes       Interval Oxygen   Interval Oxygen? Yes     Baseline Oxygen Saturation % 100 %     1 Minute Oxygen Saturation % 99 %     1 Minute Liters of Oxygen 0 L     2 Minute Oxygen Saturation % 99 %     2 Minute Liters of Oxygen 0 L     3 Minute Oxygen Saturation % 100 %     3 Minute Liters of Oxygen 0 L     4 Minute Oxygen Saturation % 98 %     4 Minute Liters of Oxygen 0 L     5 Minute Oxygen Saturation % 100 %     5 Minute Liters of Oxygen 0 L     6 Minute Oxygen Saturation % 100 %     6 Minute Liters of Oxygen 0 L     2 Minute Post Oxygen Saturation % 100 %     2 Minute Post Liters of Oxygen 0 L              Oxygen Initial Assessment:  Oxygen Initial Assessment - 08/07/23 1043       Home Oxygen   Home Oxygen Device None    Sleep Oxygen Prescription None    Home Exercise Oxygen Prescription None    Home Resting Oxygen Prescription None      Initial 6 min Walk   Oxygen Used None      Program Oxygen Prescription   Program Oxygen Prescription None      Intervention   Short Term Goals To learn and understand importance of monitoring SPO2 with pulse oximeter and demonstrate accurate use of the pulse oximeter.;To learn and understand importance of maintaining oxygen saturations>88%;To learn and demonstrate proper pursed lip breathing techniques or other breathing techniques. ;To  learn and demonstrate proper use of respiratory medications    Long  Term Goals Verbalizes importance of monitoring SPO2 with pulse oximeter and return demonstration;Maintenance of O2 saturations>88%;Exhibits proper breathing techniques, such as pursed lip breathing or other method taught during program session;Compliance with respiratory medication;Demonstrates proper use of MDI's             Oxygen Re-Evaluation:  Oxygen Re-Evaluation     Row Name 08/08/23 0748             Program Oxygen Prescription   Program Oxygen Prescription None         Home Oxygen   Home Oxygen Device None       Sleep Oxygen Prescription None       Home Exercise Oxygen Prescription None       Home Resting Oxygen Prescription None         Goals/Expected Outcomes  Short Term Goals To learn and understand importance of monitoring SPO2 with pulse oximeter and demonstrate accurate use of the pulse oximeter.;To learn and understand importance of maintaining oxygen saturations>88%;To learn and demonstrate proper pursed lip breathing techniques or other breathing techniques. ;To learn and demonstrate proper use of respiratory medications       Long  Term Goals Verbalizes importance of monitoring SPO2 with pulse oximeter and return demonstration;Maintenance of O2 saturations>88%;Exhibits proper breathing techniques, such as pursed lip breathing or other method taught during program session;Compliance with respiratory medication;Demonstrates proper use of MDI's       Goals/Expected Outcomes Compliance and understanding of oxygen saturation monitoring and breathing techniques to decrease shortness of breath                Oxygen Discharge (Final Oxygen Re-Evaluation):  Oxygen Re-Evaluation - 08/08/23 0748       Program Oxygen Prescription   Program Oxygen Prescription None      Home Oxygen   Home Oxygen Device None    Sleep Oxygen Prescription None    Home Exercise Oxygen Prescription None    Home  Resting Oxygen Prescription None      Goals/Expected Outcomes   Short Term Goals To learn and understand importance of monitoring SPO2 with pulse oximeter and demonstrate accurate use of the pulse oximeter.;To learn and understand importance of maintaining oxygen saturations>88%;To learn and demonstrate proper pursed lip breathing techniques or other breathing techniques. ;To learn and demonstrate proper use of respiratory medications    Long  Term Goals Verbalizes importance of monitoring SPO2 with pulse oximeter and return demonstration;Maintenance of O2 saturations>88%;Exhibits proper breathing techniques, such as pursed lip breathing or other method taught during program session;Compliance with respiratory medication;Demonstrates proper use of MDI's    Goals/Expected Outcomes Compliance and understanding of oxygen saturation monitoring and breathing techniques to decrease shortness of breath             Initial Exercise Prescription:  Initial Exercise Prescription - 08/07/23 1100       Date of Initial Exercise RX and Referring Provider   Date 08/07/23    Referring Provider Everardo All    Expected Discharge Date 11/02/23      Treadmill   MPH 1.8    Grade 0    Minutes 15      Recumbant Elliptical   Level 1    Minutes 15    METs 2      Prescription Details   Frequency (times per week) 2    Duration Progress to 30 minutes of continuous aerobic without signs/symptoms of physical distress      Intensity   THRR 40-80% of Max Heartrate 55-110    Ratings of Perceived Exertion 11-13    Perceived Dyspnea 0-4      Progression   Progression Continue to progress workloads to maintain intensity without signs/symptoms of physical distress.      Resistance Training   Training Prescription Yes    Weight blue bands    Reps 10-15             Perform Capillary Blood Glucose checks as needed.  Exercise Prescription Changes:   Exercise Comments:   Exercise Goals and  Review:   Exercise Goals     Row Name 08/07/23 1151             Exercise Goals   Increase Physical Activity Yes       Intervention Provide advice, education, support and counseling about physical activity/exercise needs.;Develop  an individualized exercise prescription for aerobic and resistive training based on initial evaluation findings, risk stratification, comorbidities and participant's personal goals.       Expected Outcomes Short Term: Attend rehab on a regular basis to increase amount of physical activity.;Long Term: Exercising regularly at least 3-5 days a week.;Long Term: Add in home exercise to make exercise part of routine and to increase amount of physical activity.       Increase Strength and Stamina Yes       Intervention Provide advice, education, support and counseling about physical activity/exercise needs.;Develop an individualized exercise prescription for aerobic and resistive training based on initial evaluation findings, risk stratification, comorbidities and participant's personal goals.       Expected Outcomes Short Term: Increase workloads from initial exercise prescription for resistance, speed, and METs.;Short Term: Perform resistance training exercises routinely during rehab and add in resistance training at home;Long Term: Improve cardiorespiratory fitness, muscular endurance and strength as measured by increased METs and functional capacity ( )       Able to understand and use rate of perceived exertion (RPE) scale Yes       Intervention Provide education and explanation on how to use RPE scale       Expected Outcomes Short Term: Able to use RPE daily in rehab to express subjective intensity level;Long Term:  Able to use RPE to guide intensity level when exercising independently       Able to understand and use Dyspnea scale Yes       Intervention Provide education and explanation on how to use Dyspnea scale       Expected Outcomes Short Term: Able to use  Dyspnea scale daily in rehab to express subjective sense of shortness of breath during exertion;Long Term: Able to use Dyspnea scale to guide intensity level when exercising independently       Knowledge and understanding of Target Heart Rate Range (THRR) Yes       Intervention Provide education and explanation of THRR including how the numbers were predicted and where they are located for reference       Expected Outcomes Short Term: Able to state/look up THRR;Long Term: Able to use THRR to govern intensity when exercising independently;Short Term: Able to use daily as guideline for intensity in rehab       Understanding of Exercise Prescription Yes       Intervention Provide education, explanation, and written materials on patient's individual exercise prescription       Expected Outcomes Short Term: Able to explain program exercise prescription;Long Term: Able to explain home exercise prescription to exercise independently                Exercise Goals Re-Evaluation :  Exercise Goals Re-Evaluation     Row Name 08/08/23 0747             Exercise Goal Re-Evaluation   Exercise Goals Review Increase Physical Activity;Able to understand and use Dyspnea scale;Understanding of Exercise Prescription;Increase Strength and Stamina;Knowledge and understanding of Target Heart Rate Range (THRR);Able to understand and use rate of perceived exertion (RPE) scale       Comments Taji is scheduled to begin exercise on 2/27. Will monitor and progress as able.       Expected Outcomes Though exercise at rehab and home, the patient will decrease shortness of breath with daily activities and feel confident in carrying out an exercise regimen at home.  Discharge Exercise Prescription (Final Exercise Prescription Changes):   Nutrition:  Target Goals: Understanding of nutrition guidelines, daily intake of sodium 1500mg , cholesterol 200mg , calories 30% from fat and 7% or less from  saturated fats, daily to have 5 or more servings of fruits and vegetables.  Biometrics:    Nutrition Therapy Plan and Nutrition Goals:   Nutrition Assessments:  MEDIFICTS Score Key: >=70 Need to make dietary changes  40-70 Heart Healthy Diet <= 40 Therapeutic Level Cholesterol Diet   Picture Your Plate Scores: <60 Unhealthy dietary pattern with much room for improvement. 41-50 Dietary pattern unlikely to meet recommendations for good health and room for improvement. 51-60 More healthful dietary pattern, with some room for improvement.  >60 Healthy dietary pattern, although there may be some specific behaviors that could be improved.    Nutrition Goals Re-Evaluation:   Nutrition Goals Discharge (Final Nutrition Goals Re-Evaluation):   Psychosocial: Target Goals: Acknowledge presence or absence of significant depression and/or stress, maximize coping skills, provide positive support system. Participant is able to verbalize types and ability to use techniques and skills needed for reducing stress and depression.  Initial Review & Psychosocial Screening:  Initial Psych Review & Screening - 08/07/23 1038       Initial Review   Current issues with None Identified      Family Dynamics   Good Support System? Yes    Comments spouse, daughter      Barriers   Psychosocial barriers to participate in program There are no identifiable barriers or psychosocial needs.      Screening Interventions   Interventions Encouraged to exercise             Quality of Life Scores:  Scores of 19 and below usually indicate a poorer quality of life in these areas.  A difference of  2-3 points is a clinically meaningful difference.  A difference of 2-3 points in the total score of the Quality of Life Index has been associated with significant improvement in overall quality of life, self-image, physical symptoms, and general health in studies assessing change in quality of  life.  PHQ-9: Review Flowsheet       08/07/2023  Depression screen PHQ 2/9  Decreased Interest 0  Down, Depressed, Hopeless 0  PHQ - 2 Score 0  Altered sleeping 0  Tired, decreased energy 0  Change in appetite 0  Feeling bad or failure about yourself  0  Trouble concentrating 0  Moving slowly or fidgety/restless 0  Suicidal thoughts 0  PHQ-9 Score 0  Difficult doing work/chores Not difficult at all   Interpretation of Total Score  Total Score Depression Severity:  1-4 = Minimal depression, 5-9 = Mild depression, 10-14 = Moderate depression, 15-19 = Moderately severe depression, 20-27 = Severe depression   Psychosocial Evaluation and Intervention:  Psychosocial Evaluation - 08/07/23 1039       Psychosocial Evaluation & Interventions   Interventions Encouraged to exercise with the program and follow exercise prescription    Comments Quanah denies any psychosocial barriers at this time.    Expected Outcomes For Rufus to participate in rehab free of psychosocial concerns.    Continue Psychosocial Services  No Follow up required             Psychosocial Re-Evaluation:  Psychosocial Re-Evaluation     Row Name 08/08/23 [phone]             Psychosocial Re-Evaluation   Current issues with None Identified  Comments No changes since orientation. Tevan is scheduled to start the program next week       Expected Outcomes For Josel to participate in rehab free of psychosocial barriers  or concerns       Interventions Encouraged to attend Pulmonary Rehabilitation for the exercise       Continue Psychosocial Services  No Follow up required                Psychosocial Discharge (Final Psychosocial Re-Evaluation):  Psychosocial Re-Evaluation - 08/08/23 3474       Psychosocial Re-Evaluation   Current issues with None Identified    Comments No changes since orientation. Atticus is scheduled to start the program next week    Expected Outcomes For Wendelin to participate in  rehab free of psychosocial barriers  or concerns    Interventions Encouraged to attend Pulmonary Rehabilitation for the exercise    Continue Psychosocial Services  No Follow up required             Education: Education Goals: Education classes will be provided on a weekly basis, covering required topics. Participant will state understanding/return demonstration of topics presented.  Learning Barriers/Preferences:  Learning Barriers/Preferences - 08/07/23 1040       Learning Barriers/Preferences   Learning Barriers Sight    Learning Preferences None             Education Topics: Know Your Numbers Group instruction that is supported by a PowerPoint presentation. Instructor discusses importance of knowing and understanding resting, exercise, and post-exercise oxygen saturation, heart rate, and blood pressure. Oxygen saturation, heart rate, blood pressure, rating of perceived exertion, and dyspnea are reviewed along with a normal range for these values.    Exercise for the Pulmonary Patient Group instruction that is supported by a PowerPoint presentation. Instructor discusses benefits of exercise, core components of exercise, frequency, duration, and intensity of an exercise routine, importance of utilizing pulse oximetry during exercise, safety while exercising, and options of places to exercise outside of rehab.    MET Level  Group instruction provided by PowerPoint, verbal discussion, and written material to support subject matter. Instructor reviews what METs are and how to increase METs.    Pulmonary Medications Verbally interactive group education provided by instructor with focus on inhaled medications and proper administration.   Anatomy and Physiology of the Respiratory System Group instruction provided by PowerPoint, verbal discussion, and written material to support subject matter. Instructor reviews respiratory cycle and anatomical components of the respiratory  system and their functions. Instructor also reviews differences in obstructive and restrictive respiratory diseases with examples of each.    Oxygen Safety Group instruction provided by PowerPoint, verbal discussion, and written material to support subject matter. There is an overview of "What is Oxygen" and "Why do we need it".  Instructor also reviews how to create a safe environment for oxygen use, the importance of using oxygen as prescribed, and the risks of noncompliance. There is a brief discussion on traveling with oxygen and resources the patient may utilize.   Oxygen Use Group instruction provided by PowerPoint, verbal discussion, and written material to discuss how supplemental oxygen is prescribed and different types of oxygen supply systems. Resources for more information are provided.    Breathing Techniques Group instruction that is supported by demonstration and informational handouts. Instructor discusses the benefits of pursed lip and diaphragmatic breathing and detailed demonstration on how to perform both.     Risk Factor Reduction Group instruction that is supported  by a PowerPoint presentation. Instructor discusses the definition of a risk factor, different risk factors for pulmonary disease, and how the heart and lungs work together.   Pulmonary Diseases Group instruction provided by PowerPoint, verbal discussion, and written material to support subject matter. Instructor gives an overview of the different type of pulmonary diseases. There is also a discussion on risk factors and symptoms as well as ways to manage the diseases.   Stress and Energy Conservation Group instruction provided by PowerPoint, verbal discussion, and written material to support subject matter. Instructor gives an overview of stress and the impact it can have on the body. Instructor also reviews ways to reduce stress. There is also a discussion on energy conservation and ways to conserve energy  throughout the day.   Warning Signs and Symptoms Group instruction provided by PowerPoint, verbal discussion, and written material to support subject matter. Instructor reviews warning signs and symptoms of stroke, heart attack, cold and flu. Instructor also reviews ways to prevent the spread of infection.   Other Education Group or individual verbal, written, or video instructions that support the educational goals of the pulmonary rehab program.    Knowledge Questionnaire Score:  Knowledge Questionnaire Score - 08/07/23 1146       Knowledge Questionnaire Score   Pre Score 10/18             Core Components/Risk Factors/Patient Goals at Admission:  Personal Goals and Risk Factors at Admission - 08/07/23 1043       Core Components/Risk Factors/Patient Goals on Admission   Improve shortness of breath with ADL's Yes    Intervention Provide education, individualized exercise plan and daily activity instruction to help decrease symptoms of SOB with activities of daily living.    Expected Outcomes Short Term: Improve cardiorespiratory fitness to achieve a reduction of symptoms when performing ADLs;Long Term: Be able to perform more ADLs without symptoms or delay the onset of symptoms             Core Components/Risk Factors/Patient Goals Review:   Goals and Risk Factor Review     Row Name 08/08/23 0920             Core Components/Risk Factors/Patient Goals Review   Personal Goals Review Improve shortness of breath with ADL's;Develop more efficient breathing techniques such as purse lipped breathing and diaphragmatic breathing and practicing self-pacing with activity.       Review Unable to assess, Donavan has not started the program yet. He is scheduled to start the program next week.       Expected Outcomes For Ammiel to improve his shortness of breath with ADLs and develop more efficient breathing techniques such as purse lipped breathing and diaphragmatic breathing; and  practicing self-pacing with activity                Core Components/Risk Factors/Patient Goals at Discharge (Final Review):   Goals and Risk Factor Review - 08/08/23 0920       Core Components/Risk Factors/Patient Goals Review   Personal Goals Review Improve shortness of breath with ADL's;Develop more efficient breathing techniques such as purse lipped breathing and diaphragmatic breathing and practicing self-pacing with activity.    Review Unable to assess, Refael has not started the program yet. He is scheduled to start the program next week.    Expected Outcomes For Milam to improve his shortness of breath with ADLs and develop more efficient breathing techniques such as purse lipped breathing and diaphragmatic breathing; and practicing self-pacing  with activity             ITP Comments: Pt is scheduled to begin exercise on 2/27. Recommend continued exercise, life style modification, education, and utilization of breathing techniques to increase stamina and strength, while also decreasing shortness of breath with exertion.  Dr. Mechele Collin is Medical Director for Pulmonary Rehab at [facility].

## 2023-08-14 ENCOUNTER — Ambulatory Visit (HOSPITAL_COMMUNITY): Payer: Medicare PPO | Attending: Cardiovascular Disease

## 2023-08-14 DIAGNOSIS — I10 Essential (primary) hypertension: Secondary | ICD-10-CM | POA: Diagnosis not present

## 2023-08-14 DIAGNOSIS — I2510 Atherosclerotic heart disease of native coronary artery without angina pectoris: Secondary | ICD-10-CM | POA: Diagnosis not present

## 2023-08-14 DIAGNOSIS — R0609 Other forms of dyspnea: Secondary | ICD-10-CM | POA: Diagnosis not present

## 2023-08-14 LAB — MYOCARDIAL PERFUSION IMAGING
Estimated workload: 1
Exercise duration (min): 1 min
Exercise duration (sec): 0 s
LV dias vol: 71 mL (ref 62–150)
LV sys vol: 23 mL
MPHR: 138 {beats}/min
Nuc Stress EF: 68 %
Peak HR: 98 {beats}/min
Percent HR: 71 %
Rest HR: 57 {beats}/min
Rest Nuclear Isotope Dose: 10.7 mCi
SDS: 4
SRS: 0
SSS: 4
ST Depression (mm): 0 mm
Stress Nuclear Isotope Dose: 32.5 mCi
TID: 1.06

## 2023-08-14 MED ORDER — TECHNETIUM TC 99M TETROFOSMIN IV KIT
10.7000 | PACK | Freq: Once | INTRAVENOUS | Status: AC | PRN
  Administered 2023-08-14: 10.7 via INTRAVENOUS

## 2023-08-14 MED ORDER — TECHNETIUM TC 99M TETROFOSMIN IV KIT
32.5000 | PACK | Freq: Once | INTRAVENOUS | Status: AC | PRN
  Administered 2023-08-14: 32.5 via INTRAVENOUS

## 2023-08-14 MED ORDER — REGADENOSON 0.4 MG/5ML IV SOLN
0.4000 mg | Freq: Once | INTRAVENOUS | Status: AC
  Administered 2023-08-14: 0.4 mg via INTRAVENOUS

## 2023-08-15 ENCOUNTER — Encounter (HOSPITAL_BASED_OUTPATIENT_CLINIC_OR_DEPARTMENT_OTHER): Payer: Medicare PPO

## 2023-08-15 ENCOUNTER — Ambulatory Visit (HOSPITAL_BASED_OUTPATIENT_CLINIC_OR_DEPARTMENT_OTHER): Payer: Medicare PPO | Admitting: Pulmonary Disease

## 2023-08-17 ENCOUNTER — Encounter (HOSPITAL_COMMUNITY)
Admission: RE | Admit: 2023-08-17 | Discharge: 2023-08-17 | Disposition: A | Discharge status: Home / Self Care | Payer: Medicare PPO | Source: Ambulatory Visit | Attending: Pulmonary Disease

## 2023-08-17 DIAGNOSIS — R0602 Shortness of breath: Secondary | ICD-10-CM | POA: Diagnosis not present

## 2023-08-17 NOTE — Progress Notes (Signed)
 Daily Session Note  Patient Details  Name: PANCHO RUSHING MRN: [id number] Date of Birth: November 27, 1940 Referring Provider:   Doristine Devoid Pulmonary Rehab Walk Test from 08/07/2023 in [facility]  Referring Provider Everardo All       Encounter Date: 08/17/2023  Check In:  Session Check In - 08/17/23 6213       Check-In   Supervising physician immediately available to respond to emergencies [facility] MD immediately available    Physician(s) Bernadene Person, NP    Location [facility]    Staff Present Essie Hart, RN, Doris Cheadle, MS, ACSM-CEP, Exercise Physiologist;Randi Dionisio Paschal, ACSM-CEP, Exercise Physiologist;Samantha Belarus, [place], Debbora Dus, [place]    Virtual Visit No    Medication changes reported     No    Fall or balance concerns reported    No    Tobacco Cessation No Change    Warm-up and Cool-down Performed as group-led instruction    Resistance Training Performed Yes    VAD Patient? No    PAD/SET Patient? No      Pain Assessment   Currently in Pain? No/denies             Capillary Blood Glucose: No results found for this or any previous visit (from the past 24 hours).    Social History   Tobacco Use  Smoking Status Never  Smokeless Tobacco Never    Goals Met:  Proper associated with RPD/PD & O2 Sat Independence with exercise equipment Exercise tolerated well No report of concerns or symptoms today Strength training completed today  Goals Unmet:  Not Applicable  Comments: Service time is from 0807 to 0933.    Dr. Mechele Collin is Medical Director for Pulmonary Rehab at [facility].

## 2023-08-22 ENCOUNTER — Encounter (HOSPITAL_COMMUNITY)
Admission: RE | Admit: 2023-08-22 | Discharge: 2023-08-22 | Disposition: A | Discharge status: Home / Self Care | Payer: Medicare PPO | Source: Ambulatory Visit | Attending: Pulmonary Disease | Admitting: Pulmonary Disease

## 2023-08-22 VITALS — Wt 163.4 lb

## 2023-08-22 DIAGNOSIS — R0602 Shortness of breath: Secondary | ICD-10-CM | POA: Insufficient documentation

## 2023-08-22 NOTE — Progress Notes (Signed)
 Daily Session Note  Patient Details  Name: [name] MRN: [id number] Date of Birth: [birth year] Referring Provider:   Doristine Devoid Pulmonary Rehab Walk Test from 08/07/2023 in [facility]  Referring Provider Everardo All       Encounter Date: 08/22/2023  Check In:  Session Check In - 08/22/23 0819       Check-In   Supervising physician immediately available to respond to emergencies [facility] immediately available    Physician(s) Bernadene Person, NP    [facility]    Staff Present Essie Hart, RN, Doris Cheadle, MS, ACSM-CEP, Exercise Physiologist;Velena Keegan Dionisio Paschal, ACSM-CEP, Exercise Physiologist;Samantha Belarus, [place], Debbora Dus, [place]    Virtual Visit No    Medication changes reported     No    Fall or balance concerns reported    No    Tobacco Cessation No Change    Warm-up and Cool-down Performed as group-led instruction    Resistance Training Performed Yes    VAD Patient? No    PAD/SET Patient? No      Pain Assessment   Currently in Pain? No/denies    Multiple Pain Sites No             Capillary Blood Glucose: No results found for this or any previous visit (from the past 24 hours).   Exercise Prescription Changes - 08/22/23 0900       Response to Exercise   Blood Pressure (Admit) 136/80    Blood Pressure (Exercise) 130/66    Blood Pressure (Exit) 112/66    Heart Rate (Admit) 51 bpm    Heart Rate (Exercise) 72 bpm    Heart Rate (Exit) 59 bpm    Oxygen Saturation (Admit) 100 %    Oxygen Saturation (Exercise) 98 %    Oxygen Saturation (Exit) 97 %    Rating of Perceived Exertion (Exercise) 13    Perceived Dyspnea (Exercise) 2.3    Duration Continue with 30 min of aerobic exercise without signs/symptoms of physical distress.    Intensity THRR unchanged      Progression   Progression Continue to progress workloads to maintain intensity without signs/symptoms of physical  distress.    Average METs 2.9      Resistance Training   Training Prescription Yes    Weight blue bands    Reps 10-15    Time 10 Minutes      Treadmill   MPH 1.8    Grade 0    Minutes 15    METs 2.2      Recumbant Elliptical   Level 1    RPM 50    Minutes 15    METs 2.9             Social History   Tobacco Use  Smoking Status Never  Smokeless Tobacco Never    Goals Met:  Exercise tolerated well No report of concerns or symptoms today Strength training completed today  Goals Unmet:  Not Applicable  Comments: Service time is from 0813 to [phone].    Dr. Mechele Collin is Medical Director for Pulmonary Rehab at [facility].

## 2023-08-23 ENCOUNTER — Encounter (HOSPITAL_BASED_OUTPATIENT_CLINIC_OR_DEPARTMENT_OTHER): Payer: Self-pay | Admitting: Cardiovascular Disease

## 2023-08-24 ENCOUNTER — Encounter (HOSPITAL_COMMUNITY)
Admission: RE | Admit: 2023-08-24 | Discharge: 2023-08-24 | Disposition: A | Discharge status: Home / Self Care | Payer: Medicare PPO | Source: Ambulatory Visit | Attending: Pulmonary Disease | Admitting: Pulmonary Disease

## 2023-08-24 DIAGNOSIS — R0602 Shortness of breath: Secondary | ICD-10-CM

## 2023-08-24 NOTE — Progress Notes (Signed)
 Daily Session Note  Patient Details  Name: [name] MRN: [id number] Date of Birth: 11/15/1940 Referring Provider:   Doristine Devoid Pulmonary Rehab Walk Test from 08/07/2023 in [facility] for [facility], Vascular, & Lung Health  Referring Provider Everardo All       Encounter Date: 08/24/2023  Check In:  Session Check In - 08/24/23 4098       Check-In   Supervising physician immediately available to respond to emergencies [facility] immediately available    Physician(s) Robin Searing, NP    [facility]    Staff Present Essie Hart, RN, Doris Cheadle, MS, ACSM-CEP, Exercise Physiologist;Kielee Care Dionisio Paschal, ACSM-CEP, Exercise Physiologist;Casey Katrinka Blazing, RT    Virtual Visit No    Medication changes reported     No    Fall or balance concerns reported    No    Tobacco Cessation No Change    Warm-up and Cool-down Performed as group-led instruction    Resistance Training Performed Yes    VAD Patient? No    PAD/SET Patient? No      Pain Assessment   Currently in Pain? No/denies             Capillary Blood Glucose: No results found for this or any previous visit (from the past 24 hours).    Social History   Tobacco Use  Smoking Status Never  Smokeless Tobacco Never    Goals Met:  Exercise tolerated well No report of concerns or symptoms today Strength training completed today  Goals Unmet:  Not Applicable  Comments: Service time is from 0811 to 0935.    Dr. Mechele Collin is Medical Director for Pulmonary Rehab at [facility].

## 2023-08-28 ENCOUNTER — Encounter (HOSPITAL_BASED_OUTPATIENT_CLINIC_OR_DEPARTMENT_OTHER): Payer: Self-pay

## 2023-08-28 ENCOUNTER — Telehealth: Payer: Self-pay | Admitting: Cardiovascular Disease

## 2023-08-28 NOTE — Telephone Encounter (Signed)
 Called and spoke to patient. DOB verified.  Patient's concerns:  - ultrasound tech advised pt that results would be available in 2 days; upset that it has been two weeks since stress test.  - would like to schedule yearly appt.  Nurse's Recommendations:  - printed out stress test for Dr. Duke Salvia for review.  - informed pt that stress test was normal.  - Recall placed for yearly f/u, pt made aware of recall.  Patient verbalized understanding and was very thankful for call.

## 2023-08-28 NOTE — Telephone Encounter (Signed)
 Pt is wanting a call back with the result of his stress test. He is up set that no one has called him with the results for two weeks and was told someone would let him know results in 2 days

## 2023-08-28 NOTE — Telephone Encounter (Signed)
 Printed for MD review

## 2023-08-29 ENCOUNTER — Encounter (HOSPITAL_COMMUNITY)
Admission: RE | Admit: 2023-08-29 | Discharge: 2023-08-29 | Disposition: A | Discharge status: Home / Self Care | Payer: Medicare PPO | Source: Ambulatory Visit | Attending: Pulmonary Disease | Admitting: Pulmonary Disease

## 2023-08-29 DIAGNOSIS — R0602 Shortness of breath: Secondary | ICD-10-CM

## 2023-08-29 NOTE — Progress Notes (Signed)
 Daily Session Note  Patient Details  Name: [name] MRN: [id number] Date of Birth: 02-03-1941 Referring Provider:   Doristine Devoid Pulmonary Rehab Walk Test from 08/07/2023 in [facility], [facility], [facility]  Referring Provider Everardo All       Encounter Date: 08/29/2023  Check In:  Session Check In - 08/29/23 0835       Check-In   Supervising physician immediately available to respond to emergencies [facility] MD immediately available    Physician(s) Tereso Newcomer, PA    [facility]    Staff Present Raford Pitcher, MS, ACSM-CEP, Exercise Physiologist;Randi Dionisio Paschal, ACSM-CEP, Exercise Physiologist;Katlen Seyer Katrinka Blazing, RT    Virtual Visit No    Medication changes reported     No    Fall or balance concerns reported    No    Tobacco Cessation No Change    Warm-up and Cool-down Performed as group-led instruction    Resistance Training Performed Yes    VAD Patient? No    PAD/SET Patient? No      Pain Assessment   Currently in Pain? No/denies    Multiple Pain Sites No             Capillary Blood Glucose: No results found for this or any previous visit (from the past 24 hours).    Social History   Tobacco Use  Smoking Status Never  Smokeless Tobacco Never    Goals Met:  Proper associated with RPD/PD & O2 Sat Independence with exercise equipment Exercise tolerated well No report of concerns or symptoms today Strength training completed today  Goals Unmet:  Not Applicable  Comments: Service time is from 0811 to 0927.    Dr. Mechele Collin is Medical Director for Pulmonary Rehab at [facility].

## 2023-08-30 DIAGNOSIS — D485 Neoplasm of uncertain behavior of skin: Secondary | ICD-10-CM | POA: Diagnosis not present

## 2023-08-30 DIAGNOSIS — L821 Other seborrheic keratosis: Secondary | ICD-10-CM | POA: Diagnosis not present

## 2023-08-30 DIAGNOSIS — L578 Other skin changes due to chronic exposure to nonionizing radiation: Secondary | ICD-10-CM | POA: Diagnosis not present

## 2023-08-30 DIAGNOSIS — D2272 Melanocytic nevi of left lower limb, including hip: Secondary | ICD-10-CM | POA: Diagnosis not present

## 2023-08-30 DIAGNOSIS — Z86018 Personal history of other benign neoplasm: Secondary | ICD-10-CM | POA: Diagnosis not present

## 2023-08-30 DIAGNOSIS — D225 Melanocytic nevi of trunk: Secondary | ICD-10-CM | POA: Diagnosis not present

## 2023-08-30 DIAGNOSIS — L905 Scar conditions and fibrosis of skin: Secondary | ICD-10-CM | POA: Diagnosis not present

## 2023-08-30 DIAGNOSIS — L309 Dermatitis, unspecified: Secondary | ICD-10-CM | POA: Diagnosis not present

## 2023-08-30 DIAGNOSIS — Z85828 Personal history of other malignant neoplasm of skin: Secondary | ICD-10-CM | POA: Diagnosis not present

## 2023-08-30 DIAGNOSIS — D2271 Melanocytic nevi of right lower limb, including hip: Secondary | ICD-10-CM | POA: Diagnosis not present

## 2023-08-30 DIAGNOSIS — C4441 Basal cell carcinoma of skin of scalp and neck: Secondary | ICD-10-CM | POA: Diagnosis not present

## 2023-08-31 ENCOUNTER — Other Ambulatory Visit (HOSPITAL_COMMUNITY): Payer: Self-pay

## 2023-08-31 ENCOUNTER — Encounter (HOSPITAL_COMMUNITY)
Admission: RE | Admit: 2023-08-31 | Discharge: 2023-08-31 | Disposition: A | Discharge status: Home / Self Care | Payer: Medicare PPO | Source: Ambulatory Visit | Attending: Pulmonary Disease | Admitting: Pulmonary Disease

## 2023-08-31 ENCOUNTER — Other Ambulatory Visit: Payer: Self-pay

## 2023-08-31 DIAGNOSIS — R0602 Shortness of breath: Secondary | ICD-10-CM | POA: Diagnosis not present

## 2023-08-31 NOTE — Progress Notes (Signed)
 Specialty Pharmacy Refill Coordination Note  [name] is a 83 y.o. male contacted today regarding refills of specialty medication(s) Acalabrutinib Maleate (Calquence)   Patient requested Delivery   Delivery date: 09/07/23   Verified address: [address] [name] [address]   Medication will be filled on 09/06/23.

## 2023-08-31 NOTE — Progress Notes (Signed)
 Daily Session Note  Patient Details  Name: [name] MRN: [id number] Date of Birth: 05-02-1941 Referring Provider:   Doristine Devoid Pulmonary Rehab Walk Test from 08/07/2023 in [facility] for [facility], Vascular, & Lung [facility]  Referring Provider Everardo All       Encounter Date: 08/31/2023  Check In:  Session Check In - 08/31/23 0847       Check-In   Supervising physician immediately available to respond to emergencies [facility] immediately available    Physician(s) Oris Drone, NP    [facility]    Staff Present Raford Pitcher, MS, ACSM-CEP, Exercise Physiologist;Randi Dionisio Paschal, ACSM-CEP, Exercise Physiologist;Miracle Criado Hermine Messick Belarus, RD, LDN    Virtual Visit No    Medication changes reported     No    Fall or balance concerns reported    No    Tobacco Cessation No Change    Warm-up and Cool-down Performed as group-led instruction    Resistance Training Performed Yes    VAD Patient? No    PAD/SET Patient? No      Pain Assessment   Currently in Pain? No/denies    Pain Score 0-No pain    Multiple Pain Sites No             Capillary Blood Glucose: No results found for this or any previous visit (from the past 24 hours).    Social History   Tobacco Use  Smoking Status Never  Smokeless Tobacco Never    Goals Met:  Proper associated with RPD/PD & O2 Sat Independence with exercise equipment Exercise tolerated well No report of concerns or symptoms today Strength training completed today  Goals Unmet:  Not Applicable  Comments: Service time is from 0813 to 0926.    Dr. Mechele Collin is Medical Director for Pulmonary Rehab at [facility].

## 2023-09-05 ENCOUNTER — Encounter (HOSPITAL_COMMUNITY)
Admission: RE | Admit: 2023-09-05 | Discharge: 2023-09-05 | Disposition: A | Discharge status: Home / Self Care | Payer: Medicare PPO | Source: Ambulatory Visit | Attending: Pulmonary Disease | Admitting: Pulmonary Disease

## 2023-09-05 VITALS — Wt 163.8 lb

## 2023-09-05 DIAGNOSIS — R0602 Shortness of breath: Secondary | ICD-10-CM

## 2023-09-05 NOTE — Progress Notes (Signed)
 Daily Session Note  Patient Details  Name: [name] MRN: [id number] Date of Birth: 02-Apr-1941 Referring Provider:   Doristine Devoid Pulmonary Rehab Walk Test from 08/07/2023 in [facility] for Heart, Vascular, & Lung Health  Referring Provider Everardo All       Encounter Date: 09/05/2023  Check In:  Session Check In - 09/05/23 4098       Check-In   Supervising physician immediately available to respond to emergencies [facility] immediately available    Physician(s) Oris Drone, NP    Location [facility]    Staff Present Raford Pitcher, MS, ACSM-CEP, Exercise Physiologist;Randi Dionisio Paschal, ACSM-CEP, Exercise Physiologist;Jozlynn Plaia Synthia Innocent, RN, BSN    Virtual Visit No    Medication changes reported     No    Fall or balance concerns reported    No    Tobacco Cessation No Change    Warm-up and Cool-down Performed as group-led instruction    Resistance Training Performed Yes    VAD Patient? No    PAD/SET Patient? No      Pain Assessment   Currently in Pain? No/denies             Capillary Blood Glucose: No results found for this or any previous visit (from the past 24 hours).   Exercise Prescription Changes - 09/05/23 0900       Response to Exercise   Blood Pressure (Admit) 108/66    Blood Pressure (Exercise) 132/60    Blood Pressure (Exit) 100/64    Heart Rate (Admit) 50 bpm    Heart Rate (Exercise) 73 bpm    Heart Rate (Exit) 57 bpm    Oxygen Saturation (Admit) 98 %    Oxygen Saturation (Exercise) 98 %    Oxygen Saturation (Exit) 98 %    Rating of Perceived Exertion (Exercise) 12.5    Perceived Dyspnea (Exercise) 2    Duration Continue with 30 min of aerobic exercise without signs/symptoms of physical distress.    Intensity THRR unchanged      Progression   Progression Continue to progress workloads to maintain intensity without signs/symptoms of physical distress.    Average METs 3.5      Resistance  Training   Training Prescription Yes    Weight blue bands    Reps 10-15    Time 10 Minutes      Recumbant Elliptical   Level 2    Minutes 15    METs 3.5      Track   Laps 13.5    Minutes 15    METs 3             Social History   Tobacco Use  Smoking Status Never  Smokeless Tobacco Never    Goals Met:  Proper associated with RPD/PD & O2 Sat Independence with exercise equipment Exercise tolerated well No report of concerns or symptoms today Strength training completed today  Goals Unmet:  Not Applicable  Comments: Service time is from 0812 to 0919.    Dr. Mechele Collin is Medical Director for Pulmonary Rehab at [facility].

## 2023-09-06 ENCOUNTER — Other Ambulatory Visit: Payer: Self-pay

## 2023-09-06 NOTE — Progress Notes (Signed)
 Pulmonary Individual Treatment Plan  Patient Details  Name: [name] MRN: [id number] Date of Birth: 12-03-1940 Referring Provider:   Doristine Devoid Pulmonary Rehab Walk Test from 08/07/2023 in [facility]  Referring Provider Everardo All       Initial Encounter Date:  Flowsheet Row Pulmonary Rehab Walk Test from 08/07/2023 in [facility]  Date 08/07/23       Visit Diagnosis: Shortness of breath  Patient's Home Medications on Admission:   Current Outpatient Medications:    acalabrutinib maleate (CALQUENCE) 100 MG tablet, Take 1 tablet (100 mg) by mouth daily., Disp: 30 tablet, Rfl: 4   acetaminophen (TYLENOL) 325 MG tablet, Take 325 mg by mouth daily as needed for headache (pain)., Disp: , Rfl:    cholecalciferol (VITAMIN D3) 25 MCG (1000 UNIT) tablet, Take 1,000 Units by mouth daily. , Disp: , Rfl:    clobetasol cream (TEMOVATE) 0.05 %, Apply 1 Application topically as needed. (Patient not taking: Reported on 08/07/2023), Disp: , Rfl:    COVID-19 mRNA vaccine 2023-2024 (COMIRNATY) syringe, Inject 0.3 mLs into the muscle daily., Disp: 0.3 mL, Rfl: 0   EPINEPHrine (EPIPEN) 0.3 mg/0.3 mL DEVI, Inject 0.3 mLs (0.3 mg total) into the muscle once., Disp: 1 Device, Rfl: 0   influenza vaccine adjuvanted (FLUAD) 0.5 ML injection, Inject into the muscle., Disp: 0.5 mL, Rfl: 0   metoprolol succinate (TOPROL-XL) 25 MG 24 hr tablet, TAKE 1 TABLET(25 MG) BY MOUTH DAILY, Disp: 90 tablet, Rfl: 2   olmesartan (BENICAR) 40 MG tablet, Take 40 mg by mouth daily., Disp: , Rfl:    simvastatin (ZOCOR) 40 MG tablet, Take 20 mg by mouth daily. , Disp: , Rfl:   Past Medical History: Past Medical History:  Diagnosis Date   Acute encephalopathy 08/17/2016   Acute renal failure superimposed on stage 3 chronic kidney disease (HCC) 10/15/2018   AKI (acute kidney injury) (HCC) 08/16/2016    Arthritis    Bacteremia due to Enterobacter species 08/19/2016   Carpal tunnel syndrome, bilateral 10/09/2017   Chest tightness 10/15/2018   CLL (chronic lymphocytic leukemia) (HCC) 12/16/2014   Dyspnea 09/20/2012   Elevated troponin 10/16/2018   Enterobacter sepsis (HCC) 08/19/2016   Fall 10/15/2018   GERD (gastroesophageal reflux disease)    Hepatitis 1966   Drug reaction after taking medication   Hyperglycemia    Hyperlipemia    Hypertension    Hypokalemia 08/16/2016   Lung nodule seen on imaging study    bilateral lungs   Nasal bone fracture 10/15/2018   Right foot pain    SAH (subarachnoid hemorrhage) (HCC) 10/15/2018   Syncope 10/15/2018   Thrombocytopenia (HCC) 10/15/2018   Urinary tract infection with hematuria     Tobacco Use: Social History   Tobacco Use  Smoking Status Never  Smokeless Tobacco Never    Labs: Review Flowsheet       Latest Ref Rng & Units 02/03/2011 08/16/2016 10/16/2018  Labs for ITP Cardiac and Pulmonary Rehab  Cholestrol 0 - 200 mg/dL - - 725   LDL (calc) 0 - 99 mg/dL - - 61   HDL-C >36 mg/dL - - 20   Trlycerides <644 mg/dL - - 034   Hemoglobin V4Q 4.8 - 5.6 % - - 5.4   PH, Arterial 7.350 - 7.450 - 7.501  -  PCO2 arterial 32.0 - 48.0 mmHg - 25.8  -  Bicarbonate 20.0 - 28.0  mmol/L - 20.0  -  TCO2 0 - 100 mmol/L 23  - -  Acid-base deficit 0.0 - 2.0 mmol/L - 1.7  -  O2 Saturation % - 95.3  -    Capillary Blood Glucose: Lab Results  Component Value Date   GLUCAP 79 10/19/2018   GLUCAP 90 10/19/2018   GLUCAP 88 10/16/2018   GLUCAP 101 (H) 10/16/2018     Pulmonary Assessment Scores:  Pulmonary Assessment Scores     Row Name 08/07/23 1044         ADL UCSD   ADL Phase Entry     SOB Score total 10       CAT Score   CAT Score 4       mMRC Score   mMRC Score 0             UCSD: Self-administered rating of dyspnea associated with activities of daily living (ADLs) 6-point scale (0 = "not at all" to 5 = "maximal or unable to do  because of breathlessness")  Scoring Scores range from 0 to 120.  Minimally important difference is 5 units  CAT: CAT can identify the health impairment of COPD patients and is better correlated with disease progression.  CAT has a scoring range of zero to 40. The CAT score is classified into four groups of low (less than 10), medium (10 - 20), high (21-30) and very high (31-40) based on the impact level of disease on health status. A CAT score over 10 suggests significant symptoms.  A worsening CAT score could be explained by an exacerbation, poor medication adherence, poor inhaler technique, or progression of COPD or comorbid conditions.  CAT MCID is 2 points  mMRC: mMRC (Modified Medical Research Council) Dyspnea Scale is used to assess the degree of baseline functional disability in patients of respiratory disease due to dyspnea. No minimal important difference is established. A decrease in score of 1 point or greater is considered a positive change.   Pulmonary Function Assessment:  Pulmonary Function Assessment - 08/07/23 1044       Breath   Shortness of Breath Yes;Limiting activity             Exercise Target Goals: Exercise Program Goal: Individual exercise prescription set using results from initial 6 min walk test and THRR while considering  patient's activity barriers and safety.   Exercise Prescription Goal: Initial exercise prescription builds to 30-45 minutes a day of aerobic activity, 2-3 days per week.  Home exercise guidelines will be given to patient during program as part of exercise prescription that the participant will acknowledge.  Activity Barriers & Risk Stratification:  Activity Barriers & Cardiac Risk Stratification - 08/07/23 1044       Activity Barriers & Cardiac Risk Stratification   Activity Barriers Arthritis;Muscular Weakness;Deconditioning;Joint Problems             6 Minute Walk:  6 Minute Walk     Row Name 08/07/23 1139         6  Minute Walk   Phase Initial     Distance 1410 feet     Walk Time 6 minutes     # of Rest Breaks 0     MPH 2.67     METS 2.05     RPE 9     Perceived Dyspnea  0.5     VO2 Peak 7.18     Symptoms No     Resting HR 59 bpm  Resting BP 124/72     Resting Oxygen Saturation  100 %     Exercise Oxygen Saturation  during 6 min walk 98 %     Max Ex. HR 74 bpm     Max Ex. BP 138/72     2 Minute Post BP 122/70       Interval HR   1 Minute HR 71     2 Minute HR 72     3 Minute HR 72     4 Minute HR 72     5 Minute HR 73     6 Minute HR 74     2 Minute Post HR 52     Interval Heart Rate? Yes       Interval Oxygen   Interval Oxygen? Yes     Baseline Oxygen Saturation % 100 %     1 Minute Oxygen Saturation % 99 %     1 Minute Liters of Oxygen 0 L     2 Minute Oxygen Saturation % 99 %     2 Minute Liters of Oxygen 0 L     3 Minute Oxygen Saturation % 100 %     3 Minute Liters of Oxygen 0 L     4 Minute Oxygen Saturation % 98 %     4 Minute Liters of Oxygen 0 L     5 Minute Oxygen Saturation % 100 %     5 Minute Liters of Oxygen 0 L     6 Minute Oxygen Saturation % 100 %     6 Minute Liters of Oxygen 0 L     2 Minute Post Oxygen Saturation % 100 %     2 Minute Post Liters of Oxygen 0 L              Oxygen Initial Assessment:  Oxygen Initial Assessment - 08/07/23 1043       Home Oxygen   Home Oxygen Device None    Sleep Oxygen Prescription None    Home Exercise Oxygen Prescription None    Home Resting Oxygen Prescription None      Initial 6 min Walk   Oxygen Used None      Program Oxygen Prescription   Program Oxygen Prescription None      Intervention   Short Term Goals To learn and understand importance of monitoring SPO2 with pulse oximeter and demonstrate accurate use of the pulse oximeter.;To learn and understand importance of maintaining oxygen saturations>88%;To learn and demonstrate proper pursed lip breathing techniques or other breathing techniques. ;To  learn and demonstrate proper use of respiratory medications    Long  Term Goals Verbalizes importance of monitoring SPO2 with pulse oximeter and return demonstration;Maintenance of O2 saturations>88%;Exhibits proper breathing techniques, such as pursed lip breathing or other method taught during program session;Compliance with respiratory medication;Demonstrates proper use of MDI's             Oxygen Re-Evaluation:  Oxygen Re-Evaluation     Row Name 08/08/23 0748             Program Oxygen Prescription   Program Oxygen Prescription None         Home Oxygen   Home Oxygen Device None       Sleep Oxygen Prescription None       Home Exercise Oxygen Prescription None       Home Resting Oxygen Prescription None         Goals/Expected Outcomes  Short Term Goals To learn and understand importance of monitoring SPO2 with pulse oximeter and demonstrate accurate use of the pulse oximeter.;To learn and understand importance of maintaining oxygen saturations>88%;To learn and demonstrate proper pursed lip breathing techniques or other breathing techniques. ;To learn and demonstrate proper use of respiratory medications       Long  Term Goals Verbalizes importance of monitoring SPO2 with pulse oximeter and return demonstration;Maintenance of O2 saturations>88%;Exhibits proper breathing techniques, such as pursed lip breathing or other method taught during program session;Compliance with respiratory medication;Demonstrates proper use of MDI's       Goals/Expected Outcomes Compliance and understanding of oxygen saturation monitoring and breathing techniques to decrease shortness of breath                Oxygen Discharge (Final Oxygen Re-Evaluation):  Oxygen Re-Evaluation - 08/08/23 0748       Program Oxygen Prescription   Program Oxygen Prescription None      Home Oxygen   Home Oxygen Device None    Sleep Oxygen Prescription None    Home Exercise Oxygen Prescription None    Home  Resting Oxygen Prescription None      Goals/Expected Outcomes   Short Term Goals To learn and understand importance of monitoring SPO2 with pulse oximeter and demonstrate accurate use of the pulse oximeter.;To learn and understand importance of maintaining oxygen saturations>88%;To learn and demonstrate proper pursed lip breathing techniques or other breathing techniques. ;To learn and demonstrate proper use of respiratory medications    Long  Term Goals Verbalizes importance of monitoring SPO2 with pulse oximeter and return demonstration;Maintenance of O2 saturations>88%;Exhibits proper breathing techniques, such as pursed lip breathing or other method taught during program session;Compliance with respiratory medication;Demonstrates proper use of MDI's    Goals/Expected Outcomes Compliance and understanding of oxygen saturation monitoring and breathing techniques to decrease shortness of breath             Initial Exercise Prescription:  Initial Exercise Prescription - 08/07/23 1100       Date of Initial Exercise RX and Referring Provider   Date 08/07/23    Referring Provider Everardo All    Expected Discharge Date 11/02/23      Treadmill   MPH 1.8    Grade 0    Minutes 15      Recumbant Elliptical   Level 1    Minutes 15    METs 2      Prescription Details   Frequency (times per week) 2    Duration Progress to 30 minutes of continuous aerobic without signs/symptoms of physical distress      Intensity   THRR 40-80% of Max Heartrate 55-110    Ratings of Perceived Exertion 11-13    Perceived Dyspnea 0-4      Progression   Progression Continue to progress workloads to maintain intensity without signs/symptoms of physical distress.      Resistance Training   Training Prescription Yes    Weight blue bands    Reps 10-15             Perform Capillary Blood Glucose checks as needed.  Exercise Prescription Changes:   Exercise Prescription Changes     Row Name 08/22/23  0900 09/05/23 0900           Response to Exercise   Blood Pressure (Admit) 136/80 108/66      Blood Pressure (Exercise) 130/66 132/60      Blood Pressure (Exit) 112/66 100/64  Heart Rate (Admit) 51 bpm 50 bpm      Heart Rate (Exercise) 72 bpm 73 bpm      Heart Rate (Exit) 59 bpm 57 bpm      Oxygen Saturation (Admit) 100 % 98 %      Oxygen Saturation (Exercise) 98 % 98 %      Oxygen Saturation (Exit) 97 % 98 %      Rating of Perceived Exertion (Exercise) 13 12.5      Perceived Dyspnea (Exercise) 2.3 2      Duration Continue with 30 min of aerobic exercise without signs/symptoms of physical distress. Continue with 30 min of aerobic exercise without signs/symptoms of physical distress.      Intensity THRR unchanged THRR unchanged        Progression   Progression Continue to progress workloads to maintain intensity without signs/symptoms of physical distress. Continue to progress workloads to maintain intensity without signs/symptoms of physical distress.      Average METs 2.9 3.5        Resistance Training   Training Prescription Yes Yes      Weight blue bands blue bands      Reps 10-15 10-15      Time 10 Minutes 10 Minutes        Treadmill   MPH 1.8 --      Grade 0 --      Minutes 15 --      METs 2.2 --        Recumbant Elliptical   Level 1 2      RPM 50 --      Minutes 15 15      METs 2.9 3.5        Track   Laps -- 13.5      Minutes -- 15      METs -- 3               Exercise Comments:   Exercise Comments     Row Name 08/17/23 [id number]           Exercise Comments Pt completed first day of group exercise. He exercised on the recumbent elliptical for 15 min on level 1, METs 2.4. He then walked on the treadmill for 15 min, 1.8 mph, 0 incline, METs 2.3. Tolerated well although admitted to fatigue and that his workout was harder than he walks at home. Performed warm up and cool down without limitations. Discussed METs with good reception.                 Exercise Goals and Review:   Exercise Goals     Row Name 08/07/23 1151             Exercise Goals   Increase Physical Activity Yes       Intervention Provide advice, education, support and counseling about physical activity/exercise needs.;Develop an individualized exercise prescription for aerobic and resistive training based on initial evaluation findings, risk stratification, comorbidities and participant's personal goals.       Expected Outcomes Short Term: Attend rehab on a regular basis to increase amount of physical activity.;Long Term: Exercising regularly at least 3-5 days a week.;Long Term: Add in home exercise to make exercise part of routine and to increase amount of physical activity.       Increase Strength and Stamina Yes       Intervention Provide advice, education, support and counseling about physical activity/exercise needs.;Develop an individualized exercise prescription for aerobic and  resistive training based on initial evaluation findings, risk stratification, comorbidities and participant's personal goals.       Expected Outcomes Short Term: Increase workloads from initial exercise prescription for resistance, speed, and METs.;Short Term: Perform resistance training exercises routinely during rehab and add in resistance training at home;Long Term: Improve cardiorespiratory fitness, muscular endurance and strength as measured by increased METs and functional capacity ( )       Able to understand and use rate of perceived exertion (RPE) scale Yes       Intervention Provide education and explanation on how to use RPE scale       Expected Outcomes Short Term: Able to use RPE daily in rehab to express subjective intensity level;Long Term:  Able to use RPE to guide intensity level when exercising independently       Able to understand and use Dyspnea scale Yes       Intervention Provide education and explanation on how to use Dyspnea scale       Expected Outcomes  Short Term: Able to use Dyspnea scale daily in rehab to express subjective sense of shortness of breath during exertion;Long Term: Able to use Dyspnea scale to guide intensity level when exercising independently       Knowledge and understanding of Target Heart Rate Range (THRR) Yes       Intervention Provide education and explanation of THRR including how the numbers were predicted and where they are located for reference       Expected Outcomes Short Term: Able to state/look up THRR;Long Term: Able to use THRR to govern intensity when exercising independently;Short Term: Able to use daily as guideline for intensity in rehab       Understanding of Exercise Prescription Yes       Intervention Provide education, explanation, and written materials on patient's individual exercise prescription       Expected Outcomes Short Term: Able to explain program exercise prescription;Long Term: Able to explain home exercise prescription to exercise independently                Exercise Goals Re-Evaluation :  Exercise Goals Re-Evaluation     Row Name 08/08/23 0747 08/30/23 0940           Exercise Goal Re-Evaluation   Exercise Goals Review Increase Physical Activity;Able to understand and use Dyspnea scale;Understanding of Exercise Prescription;Increase Strength and Stamina;Knowledge and understanding of Target Heart Rate Range (THRR);Able to understand and use rate of perceived exertion (RPE) scale Increase Physical Activity;Able to understand and use Dyspnea scale;Understanding of Exercise Prescription;Increase Strength and Stamina;Knowledge and understanding of Target Heart Rate Range (THRR);Able to understand and use rate of perceived exertion (RPE) scale      Comments [name] is scheduled to begin exercise on 2/27. Will monitor and progress as able. [name] has completed 4 exercise sessions. He exercises for 15 min on the recumbent elliptical and treadmill. [name] averages 3.7 METs at level 1 on the  recumbent elliptical and 2.3 METs at 1.8 mph on the treadmill. He performs the warmup and cooldown standing without limitations. It is too soon to notate any discernable progressions. Will continue to monitor and progress as able.      Expected Outcomes Though exercise at rehab and home, the patient will decrease shortness of breath with daily activities and feel confident in carrying out an exercise regimen at home. Though exercise at rehab and home, the patient will decrease shortness of breath with daily activities and feel confident in carrying  out an exercise regimen at home.               Discharge Exercise Prescription (Final Exercise Prescription Changes):  Exercise Prescription Changes - 09/05/23 0900       Response to Exercise   Blood Pressure (Admit) 108/66    Blood Pressure (Exercise) 132/60    Blood Pressure (Exit) 100/64    Heart Rate (Admit) 50 bpm    Heart Rate (Exercise) 73 bpm    Heart Rate (Exit) 57 bpm    Oxygen Saturation (Admit) 98 %    Oxygen Saturation (Exercise) 98 %    Oxygen Saturation (Exit) 98 %    Rating of Perceived Exertion (Exercise) 12.5    Perceived Dyspnea (Exercise) 2    Duration Continue with 30 min of aerobic exercise without signs/symptoms of physical distress.    Intensity THRR unchanged      Progression   Progression Continue to progress workloads to maintain intensity without signs/symptoms of physical distress.    Average METs 3.5      Resistance Training   Training Prescription Yes    Weight blue bands    Reps 10-15    Time 10 Minutes      Recumbant Elliptical   Level 2    Minutes 15    METs 3.5      Track   Laps 13.5    Minutes 15    METs 3             Nutrition:  Target Goals: Understanding of nutrition guidelines, daily intake of sodium 1500mg , cholesterol 200mg , calories 30% from fat and 7% or less from saturated fats, daily to have 5 or more servings of fruits and vegetables.  Biometrics:    Nutrition  Therapy Plan and Nutrition Goals:  Nutrition Therapy & Goals - 08/29/23 1039       Nutrition Therapy   Diet general healthy diet      Personal Nutrition Goals   Nutrition Goal Patient to improve diet quality by using the plate method as a guide for meal planning to include lean protein/plant protein, fruits, vegetables, whole grains, nonfat dairy as part of a well-balanced diet.    Comments [name] has medical history of CAD, HTN, CLL, CKD3, shortness of breath. Per cardiology notes, he continues mindfulness of sodum and saturated fat intake. He continues to monitor blood pressure at home. LDL remains at goal. Johnattan will benefit from participation in pulmonary rehab for nutrition and exercise support.      Intervention Plan   Intervention Prescribe, educate and counsel regarding individualized specific dietary modifications aiming towards targeted core components such as weight, hypertension, lipid management, diabetes, heart failure and other comorbidities.;Nutrition handout(s) given to patient.    Expected Outcomes Short Term Goal: Understand basic principles of dietary content, such as calories, fat, sodium, cholesterol and nutrients.;Long Term Goal: Adherence to prescribed nutrition plan.             Nutrition Assessments:  MEDIFICTS Score Key: >=70 Need to make dietary changes  40-70 Heart Healthy Diet <= 40 Therapeutic Level Cholesterol Diet   Picture Your Plate Scores: <28 Unhealthy dietary pattern with much room for improvement. 41-50 Dietary pattern unlikely to meet recommendations for good health and room for improvement. 51-60 More healthful dietary pattern, with some room for improvement.  >60 Healthy dietary pattern, although there may be some specific behaviors that could be improved.    Nutrition Goals Re-Evaluation:  Nutrition Goals Re-Evaluation  Row Name 08/29/23 1039             Goals   Comment lipids WNL, LDL 67       Expected Outcome [name] has  medical history of CAD, HTN, CLL, CKD3, shortness of breath. Per cardiology notes, he continues mindfulness of sodum and saturated fat intake. He continues to monitor blood pressure at home. LDL remains at goal. [name] will benefit from participation in pulmonary rehab for nutrition and exercise support.                Nutrition Goals Discharge (Final Nutrition Goals Re-Evaluation):  Nutrition Goals Re-Evaluation - 08/29/23 1039       Goals   Comment lipids WNL, LDL 67    Expected Outcome [name] has medical history of CAD, HTN, CLL, CKD3, shortness of breath. Per cardiology notes, he continues mindfulness of sodum and saturated fat intake. He continues to monitor blood pressure at home. LDL remains at goal. Jader will benefit from participation in pulmonary rehab for nutrition and exercise support.             Psychosocial: Target Goals: Acknowledge presence or absence of significant depression and/or stress, maximize coping skills, provide positive support system. Participant is able to verbalize types and ability to use techniques and skills needed for reducing stress and depression.  Initial Review & Psychosocial Screening:  Initial Psych Review & Screening - 08/07/23 1038       Initial Review   Current issues with None Identified      Family Dynamics   Good Support System? Yes    Comments spouse, daughter      Barriers   Psychosocial barriers to participate in program There are no identifiable barriers or psychosocial needs.      Screening Interventions   Interventions Encouraged to exercise             Quality of Life Scores:  Scores of 19 and below usually indicate a poorer quality of life in these areas.  A difference of  2-3 points is a clinically meaningful difference.  A difference of 2-3 points in the total score of the Quality of Life Index has been associated with significant improvement in overall quality of life, self-image, physical symptoms, and  general health in studies assessing change in quality of life.  PHQ-9: Review Flowsheet       08/07/2023  Depression screen PHQ 2/9  Decreased Interest 0  Down, Depressed, Hopeless 0  PHQ - 2 Score 0  Altered sleeping 0  Tired, decreased energy 0  Change in appetite 0  Feeling bad or failure about yourself  0  Trouble concentrating 0  Moving slowly or fidgety/restless 0  Suicidal thoughts 0  PHQ-9 Score 0  Difficult doing work/chores Not difficult at all   Interpretation of Total Score  Total Score Depression Severity:  1-4 = Minimal depression, 5-9 = Mild depression, 10-14 = Moderate depression, 15-19 = Moderately severe depression, 20-27 = Severe depression   Psychosocial Evaluation and Intervention:  Psychosocial Evaluation - 08/07/23 1039       Psychosocial Evaluation & Interventions   Interventions Encouraged to exercise with the program and follow exercise prescription    Comments [name] denies any psychosocial barriers at this time.    Expected Outcomes For Ibrahima to participate in rehab free of psychosocial concerns.    Continue Psychosocial Services  No Follow up required             Psychosocial Re-Evaluation:  Psychosocial Re-Evaluation     Row Name 08/08/23 2841 08/28/23 1553           Psychosocial Re-Evaluation   Current issues with None Identified None Identified      Comments No changes since orientation. [name] is scheduled to start the program next week [name] continues to deny any psychosocial barriers or concerns a thtis time.      Expected Outcomes For [name] to participate in rehab free of psychosocial barriers  or concerns For [name] to participate in rehab free of psychosocial barriers  or concerns      Interventions Encouraged to attend Pulmonary Rehabilitation for the exercise Encouraged to attend Pulmonary Rehabilitation for the exercise      Continue Psychosocial Services  No Follow up required No Follow up required                Psychosocial Discharge (Final Psychosocial Re-Evaluation):  Psychosocial Re-Evaluation - 08/28/23 1553       Psychosocial Re-Evaluation   Current issues with None Identified    Comments [name] continues to deny any psychosocial barriers or concerns a thtis time.    Expected Outcomes For [name] to participate in rehab free of psychosocial barriers  or concerns    Interventions Encouraged to attend Pulmonary Rehabilitation for the exercise    Continue Psychosocial Services  No Follow up required             Education: Education Goals: Education classes will be provided on a weekly basis, covering required topics. Participant will state understanding/return demonstration of topics presented.  Learning Barriers/Preferences:  Learning Barriers/Preferences - 08/07/23 1040       Learning Barriers/Preferences   Learning Barriers Sight    Learning Preferences None             Education Topics: Know Your Numbers Group instruction that is supported by a PowerPoint presentation. Instructor discusses importance of knowing and understanding resting, exercise, and post-exercise oxygen saturation, heart rate, and blood pressure. Oxygen saturation, heart rate, blood pressure, rating of perceived exertion, and dyspnea are reviewed along with a normal range for these values.    Exercise for the Pulmonary Patient Group instruction that is supported by a PowerPoint presentation. Instructor discusses benefits of exercise, core components of exercise, frequency, duration, and intensity of an exercise routine, importance of utilizing pulse oximetry during exercise, safety while exercising, and options of places to exercise outside of rehab.    MET Level  Group instruction provided by PowerPoint, verbal discussion, and written material to support subject matter. Instructor reviews what METs are and how to increase METs.    Pulmonary Medications Verbally interactive group education provided  by instructor with focus on inhaled medications and proper administration.   Anatomy and Physiology of the Respiratory System Group instruction provided by PowerPoint, verbal discussion, and written material to support subject matter. Instructor reviews respiratory cycle and anatomical components of the respiratory system and their functions. Instructor also reviews differences in obstructive and restrictive respiratory diseases with examples of each.  Flowsheet Row PULMONARY REHAB OTHER RESPIRATORY from 08/24/2023 in [facility]  Date 08/24/23  Educator RT  Instruction Review Code 1- Verbalizes Understanding       Oxygen Safety Group instruction provided by PowerPoint, verbal discussion, and written material to support subject matter. There is an overview of "What is Oxygen" and "Why do we need it".  Instructor also reviews how to create a safe environment for oxygen  use, the importance of using oxygen as prescribed, and the risks of noncompliance. There is a brief discussion on traveling with oxygen and resources the patient may utilize.   Oxygen Use Group instruction provided by PowerPoint, verbal discussion, and written material to discuss how supplemental oxygen is prescribed and different types of oxygen supply systems. Resources for more information are provided.    Breathing Techniques Group instruction that is supported by demonstration and informational handouts. Instructor discusses the benefits of pursed lip and diaphragmatic breathing and detailed demonstration on how to perform both.     Risk Factor Reduction Group instruction that is supported by a PowerPoint presentation. Instructor discusses the definition of a risk factor, different risk factors for pulmonary disease, and how the heart and lungs work together.   Pulmonary Diseases Group instruction provided by PowerPoint, verbal discussion, and written material  to support subject matter. Instructor gives an overview of the different type of pulmonary diseases. There is also a discussion on risk factors and symptoms as well as ways to manage the diseases. Flowsheet Row PULMONARY REHAB OTHER RESPIRATORY from 08/17/2023 in [facility], [facility], [facility]  Date 08/17/23  Educator RT  Instruction Review Code 1- Verbalizes Understanding       Stress and Energy Conservation Group instruction provided by PowerPoint, verbal discussion, and written material to support subject matter. Instructor gives an overview of stress and the impact it can have on the body. Instructor also reviews ways to reduce stress. There is also a discussion on energy conservation and ways to conserve energy throughout the day.   Warning Signs and Symptoms Group instruction provided by PowerPoint, verbal discussion, and written material to support subject matter. Instructor reviews warning signs and symptoms of stroke, heart attack, cold and flu. Instructor also reviews ways to prevent the spread of infection.   Other Education Group or individual verbal, written, or video instructions that support the educational goals of the pulmonary rehab program.    Knowledge Questionnaire Score:  Knowledge Questionnaire Score - 08/07/23 1146       Knowledge Questionnaire Score   Pre Score 10/18             Core Components/Risk Factors/Patient Goals at Admission:  Personal Goals and Risk Factors at Admission - 08/07/23 1043       Core Components/Risk Factors/Patient Goals on Admission   Improve shortness of breath with ADL's Yes    Intervention Provide education, individualized exercise plan and daily activity instruction to help decrease symptoms of SOB with activities of daily living.    Expected Outcomes Short Term: Improve cardiorespiratory fitness to achieve a reduction of symptoms when performing ADLs;Long Term: Be able to perform more  ADLs without symptoms or delay the onset of symptoms             Core Components/Risk Factors/Patient Goals Review:   Goals and Risk Factor Review     Row Name 08/08/23 0920 08/28/23 1554           Core Components/Risk Factors/Patient Goals Review   Personal Goals Review Improve shortness of breath with ADL's;Develop more efficient breathing techniques such as purse lipped breathing and diaphragmatic breathing and practicing self-pacing with activity. Improve shortness of breath with ADL's;Develop more efficient breathing techniques such as purse lipped breathing and diaphragmatic breathing and practicing self-pacing with activity.      Review Unable to assess, [name] has not started the program yet. He is scheduled to start the program  next week. Goal progressing for improving shortness of breath. [name] is currently able to maintain sats >88% on RA while exercising. He is currently exercising on the treadmill and the recumbant elliptical. Goal progressing for developing more efficient breathing techniques such as purse lipped breathing and diaphragmatic breathing; and practicing self-pacing with activity. We will continue to monitor [name]'s progress throughout the program.      Expected Outcomes For [name] to improve his shortness of breath with ADLs and develop more efficient breathing techniques such as purse lipped breathing and diaphragmatic breathing; and practicing self-pacing with activity For [name] to improve his shortness of breath with ADLs and develop more efficient breathing techniques such as purse lipped breathing and diaphragmatic breathing; and practicing self-pacing with activity               Core Components/Risk Factors/Patient Goals at Discharge (Final Review):   Goals and Risk Factor Review - 08/28/23 1554       Core Components/Risk Factors/Patient Goals Review   Personal Goals Review Improve shortness of breath with ADL's;Develop more efficient breathing  techniques such as purse lipped breathing and diaphragmatic breathing and practicing self-pacing with activity.    Review Goal progressing for improving shortness of breath. [name] is currently able to maintain sats >88% on RA while exercising. He is currently exercising on the treadmill and the recumbant elliptical. Goal progressing for developing more efficient breathing techniques such as purse lipped breathing and diaphragmatic breathing; and practicing self-pacing with activity. We will continue to monitor [name]'s progress throughout the program.    Expected Outcomes For [name] to improve his shortness of breath with ADLs and develop more efficient breathing techniques such as purse lipped breathing and diaphragmatic breathing; and practicing self-pacing with activity             ITP Comments: Pt is making expected progress toward Pulmonary Rehab goals after completing 6 session(s). Recommend continued exercise, life style modification, education, and utilization of breathing techniques to increase stamina and strength, while also decreasing shortness of breath with exertion.     Comments: Dr. Mechele Collin is Medical Director for Pulmonary Rehab at [facility]

## 2023-09-07 ENCOUNTER — Encounter (HOSPITAL_COMMUNITY)
Admission: RE | Admit: 2023-09-07 | Discharge: 2023-09-07 | Disposition: A | Discharge status: Home / Self Care | Payer: Medicare PPO | Source: Ambulatory Visit | Attending: Pulmonary Disease | Admitting: Pulmonary Disease

## 2023-09-07 DIAGNOSIS — R0602 Shortness of breath: Secondary | ICD-10-CM | POA: Diagnosis not present

## 2023-09-07 NOTE — Telephone Encounter (Signed)
 Those blood pressures look great.  Thank you!  Karlin Binion C. Duke Salvia, MD, [facility]

## 2023-09-07 NOTE — Progress Notes (Signed)
 Daily Session Note  Patient Details  Name: [name] STEEVES MRN: [id number] Date of Birth: [birth year] Referring Provider:   Doristine Devoid Pulmonary Rehab Walk Test from 08/07/2023 in [facility], Vascular, & [facility]  Referring Provider Everardo All       Encounter Date: 09/07/2023  Check In:  Session Check In - 09/07/23 0825       Check-In   Supervising physician immediately available to respond to emergencies [facility] MD immediately available    Physician(s) Reather Littler, NP    Location [facility]    Staff Present Raford Pitcher, MS, ACSM-CEP, Exercise Physiologist;Randi Dionisio Paschal, ACSM-CEP, Exercise Physiologist;Wilferd Ritson Synthia Innocent, RN, BSN    Virtual Visit No    Medication changes reported     No    Fall or balance concerns reported    No    Tobacco Cessation No Change    Warm-up and Cool-down Performed as group-led instruction    Resistance Training Performed Yes    VAD Patient? No    PAD/SET Patient? No      Pain Assessment   Currently in Pain? No/denies    Pain Score 0-No pain    Multiple Pain Sites No             Capillary Blood Glucose: No results found for this or any previous visit (from the past 24 hours).    Social History   Tobacco Use  Smoking Status Never  Smokeless Tobacco Never    Goals Met:  Proper associated with RPD/PD & O2 Sat Independence with exercise equipment Exercise tolerated well No report of concerns or symptoms today Strength training completed today  Goals Unmet:  Not Applicable  Comments: Service time is from 0812 to 0932.    Dr. Mechele Collin is Medical Director for Pulmonary Rehab at [facility].

## 2023-09-12 ENCOUNTER — Encounter (HOSPITAL_COMMUNITY)
Admission: RE | Admit: 2023-09-12 | Discharge: 2023-09-12 | Disposition: A | Discharge status: Home / Self Care | Payer: Medicare PPO | Source: Ambulatory Visit | Attending: Pulmonary Disease

## 2023-09-12 DIAGNOSIS — R0602 Shortness of breath: Secondary | ICD-10-CM

## 2023-09-12 NOTE — Progress Notes (Signed)
 Daily Session Note  Patient Details  Name: [name] MRN: [id number] Date of Birth: 11-05-1940 Referring Provider:   Doristine Devoid Pulmonary Rehab Walk Test from 08/07/2023 in [facility] for Heart, Vascular, & Lung Health  Referring Provider Everardo All       Encounter Date: 09/12/2023  Check In:  Session Check In - 09/12/23 0817       Check-In   Supervising physician immediately available to respond to emergencies [facility] MD immediately available    Physician(s) Hoover Browns, NP    [facility]    Staff Present Raford Pitcher, MS, ACSM-CEP, Exercise Physiologist;Randi Dionisio Paschal, ACSM-CEP, Exercise Physiologist;Casey Synthia Innocent, RN, BSN    Virtual Visit No    Medication changes reported     No    Fall or balance concerns reported    No    Tobacco Cessation No Change    Warm-up and Cool-down Performed as group-led instruction    Resistance Training Performed Yes    VAD Patient? No    PAD/SET Patient? No      Pain Assessment   Currently in Pain? No/denies    Multiple Pain Sites No             Capillary Blood Glucose: No results found for this or any previous visit (from the past 24 hours).    Social History   Tobacco Use  Smoking Status Never  Smokeless Tobacco Never    Goals Met:  Proper associated with RPD/PD & O2 Sat Exercise tolerated well No report of concerns or symptoms today Strength training completed today  Goals Unmet:  Not Applicable  Comments: Service time is from 0810 to 0909.    Dr. Mechele Collin is Medical Director for Pulmonary Rehab at [facility].

## 2023-09-14 ENCOUNTER — Encounter (HOSPITAL_COMMUNITY)
Admission: RE | Admit: 2023-09-14 | Discharge: 2023-09-14 | Disposition: A | Discharge status: Home / Self Care | Payer: Medicare PPO | Source: Ambulatory Visit | Attending: Pulmonary Disease | Admitting: Pulmonary Disease

## 2023-09-14 DIAGNOSIS — R0602 Shortness of breath: Secondary | ICD-10-CM

## 2023-09-14 NOTE — Progress Notes (Signed)
 Daily Session Note  Patient Details  Name: SHAHAB POLHAMUS MRN: [id number] Date of Birth: [birth year] Referring Provider:   Doristine Devoid Pulmonary Rehab Walk Test from 08/07/2023 in [facility] Heart, Vascular, & Lung Health  Referring Provider Everardo All       Encounter Date: 09/14/2023  Check In:  Session Check In - 09/14/23 7829       Check-In   Supervising physician immediately available to respond to emergencies [facility] MD immediately available    Physician(s) Jari Favre, NP    Location [facility]    Staff Present Raford Pitcher, MS, ACSM-CEP, Exercise Physiologist;Randi Dionisio Paschal, ACSM-CEP, Exercise Physiologist;Delan Ksiazek Synthia Innocent, RN, BSN;Samantha Belarus, RD, LDN    Virtual Visit No    Medication changes reported     No    Tobacco Cessation No Change    Warm-up and Cool-down Performed as group-led instruction    Resistance Training Performed Yes    VAD Patient? No    PAD/SET Patient? No      Pain Assessment   Currently in Pain? No/denies             Capillary Blood Glucose: No results found for this or any previous visit (from the past 24 hours).    Social History   Tobacco Use  Smoking Status Never  Smokeless Tobacco Never    Goals Met:  Proper associated with RPD/PD & O2 Sat Independence with exercise equipment Exercise tolerated well No report of concerns or symptoms today Strength training completed today  Goals Unmet:  Not Applicable  Comments: Service time is from 0808 to [phone].    Dr. Mechele Collin is Medical Director for Pulmonary Rehab at [facility].

## 2023-09-19 ENCOUNTER — Encounter (HOSPITAL_COMMUNITY)
Admission: RE | Admit: 2023-09-19 | Discharge: 2023-09-19 | Disposition: A | Discharge status: Home / Self Care | Payer: Medicare PPO | Source: Ambulatory Visit | Attending: Pulmonary Disease | Admitting: Pulmonary Disease

## 2023-09-19 VITALS — Wt 163.1 lb

## 2023-09-19 DIAGNOSIS — R0602 Shortness of breath: Secondary | ICD-10-CM | POA: Diagnosis not present

## 2023-09-19 NOTE — Progress Notes (Signed)
 Daily Session Note  Patient Details  Name: [name] MRN: [id number] Date of Birth: [birth year] Referring Provider:   Doristine Devoid Pulmonary Rehab Walk Test from 08/07/2023 in [facility] for Heart, Vascular, & Lung Health  Referring Provider Everardo All       Encounter Date: 09/19/2023  Check In:  Session Check In - 09/19/23 2440       Check-In   Supervising physician immediately available to respond to emergencies [facility] MD immediately available    Physician(s) Jari Favre, NP    [facility]    Staff Present Raford Pitcher, MS, ACSM-CEP, Exercise Physiologist;Randi Dionisio Paschal, ACSM-CEP, Exercise Physiologist;Casey Synthia Innocent, RN, BSN    Virtual Visit No    Medication changes reported     No    Fall or balance concerns reported    No    Tobacco Cessation No Change    Warm-up and Cool-down Performed as group-led instruction    Resistance Training Performed Yes    VAD Patient? No    PAD/SET Patient? No      Pain Assessment   Currently in Pain? No/denies    Multiple Pain Sites No             Capillary Blood Glucose: No results found for this or any previous visit (from the past 24 hours).   Exercise Prescription Changes - 09/19/23 0900       Response to Exercise   Blood Pressure (Admit) 104/62    Blood Pressure (Exercise) 132/66    Blood Pressure (Exit) 108/58    Heart Rate (Admit) 63 bpm    Heart Rate (Exercise) 79 bpm    Heart Rate (Exit) 60 bpm    Oxygen Saturation (Admit) 98 %    Oxygen Saturation (Exercise) 96 %    Oxygen Saturation (Exit) 97 %    Rating of Perceived Exertion (Exercise) 12    Perceived Dyspnea (Exercise) 2    Duration Continue with 30 min of aerobic exercise without signs/symptoms of physical distress.    Intensity THRR unchanged      Progression   Progression Continue to progress workloads to maintain intensity without signs/symptoms of physical distress.      Resistance  Training   Training Prescription Yes    Weight blue bands    Reps 10-15    Time 10 Minutes      Treadmill   MPH 2    Grade 1    Minutes 15      Recumbant Elliptical   Level 2    Minutes 15    METs 3.9             Social History   Tobacco Use  Smoking Status Never  Smokeless Tobacco Never    Goals Met:  Proper associated with RPD/PD & O2 Sat Independence with exercise equipment Exercise tolerated well No report of concerns or symptoms today Strength training completed today  Goals Unmet:  Not Applicable  Comments: Service time is from 0814 to 0933.    Dr. Mechele Collin is Medical Director for Pulmonary Rehab at [facility].

## 2023-09-20 ENCOUNTER — Telehealth (HOSPITAL_COMMUNITY): Payer: Self-pay

## 2023-09-20 NOTE — Telephone Encounter (Signed)
 Called to request pt to come at 8:30 am tomorrow. Pt confrimed

## 2023-09-21 ENCOUNTER — Encounter (HOSPITAL_COMMUNITY)
Admission: RE | Admit: 2023-09-21 | Discharge: 2023-09-21 | Disposition: A | Discharge status: Home / Self Care | Payer: Medicare PPO | Source: Ambulatory Visit | Attending: Pulmonary Disease

## 2023-09-21 DIAGNOSIS — R0602 Shortness of breath: Secondary | ICD-10-CM | POA: Diagnosis not present

## 2023-09-21 NOTE — Progress Notes (Signed)
 Daily Session Note  Patient Details  Name: MORRELL FLUKE MRN: [id number] Date of Birth: [birth year] Referring Provider:   Doristine Devoid Pulmonary Rehab Walk Test from 08/07/2023 in [facility] for Heart, Vascular, & Lung Health  Referring Provider Everardo All       Encounter Date: 09/21/2023  Check In:  Session Check In - 09/21/23 0906       Check-In   Supervising physician immediately available to respond to emergencies [facility] MD immediately available    Physician(s) Carlyon Shadow, NP    [facility]    Staff Present Raford Pitcher, MS, ACSM-CEP, Exercise Physiologist;Randi Dionisio Paschal, ACSM-CEP, Exercise Physiologist;Casey Synthia Innocent, RN, BSN;Samantha Belarus, RD, LDN    Virtual Visit No    Medication changes reported     No    Fall or balance concerns reported    No    Tobacco Cessation No Change    Warm-up and Cool-down Performed as group-led instruction    Resistance Training Performed Yes    VAD Patient? No    PAD/SET Patient? No      Pain Assessment   Currently in Pain? No/denies    Multiple Pain Sites No             Capillary Blood Glucose: No results found for this or any previous visit (from the past 24 hours).    Social History   Tobacco Use  Smoking Status Never  Smokeless Tobacco Never    Goals Met:  Proper associated with RPD/PD & O2 Sat Exercise tolerated well No report of concerns or symptoms today Strength training completed today  Goals Unmet:  Not Applicable  Comments: Service time is from 0837 to 0941.    Dr. Mechele Collin is Medical Director for Pulmonary Rehab at [facility].

## 2023-09-26 ENCOUNTER — Encounter (HOSPITAL_COMMUNITY)
Admission: RE | Admit: 2023-09-26 | Discharge: 2023-09-26 | Disposition: A | Discharge status: Home / Self Care | Payer: Medicare PPO | Source: Ambulatory Visit | Attending: Pulmonary Disease | Admitting: Pulmonary Disease

## 2023-09-26 DIAGNOSIS — R0602 Shortness of breath: Secondary | ICD-10-CM | POA: Diagnosis not present

## 2023-09-26 NOTE — Progress Notes (Signed)
 Daily Session Note  Patient Details  Name: [name] MRN: [id number] Date of Birth: 1941/03/12 Referring Provider:   Doristine Devoid Pulmonary Rehab Walk Test from 08/07/2023 in [facility] for Heart, Vascular, & Lung Health  Referring Provider Everardo All       Encounter Date: 09/26/2023  Check In:  Session Check In - 09/26/23 0820       Check-In   Supervising physician immediately available to respond to emergencies [facility] MD immediately available    Physician(s) Rise Paganini, NP    Location [facility]    Staff Present Raford Pitcher, MS, ACSM-CEP, Exercise Physiologist;Randi Dionisio Paschal, ACSM-CEP, Exercise Physiologist;Shalawn Wynder Synthia Innocent, RN, BSN    Virtual Visit No    Medication changes reported     No    Fall or balance concerns reported    No    Tobacco Cessation No Change    Warm-up and Cool-down Performed as group-led instruction    Resistance Training Performed Yes    VAD Patient? No    PAD/SET Patient? No      Pain Assessment   Currently in Pain? No/denies    Multiple Pain Sites No             Capillary Blood Glucose: No results found for this or any previous visit (from the past 24 hours).    Social History   Tobacco Use  Smoking Status Never  Smokeless Tobacco Never    Goals Met:  Proper associated with RPD/PD & O2 Sat Independence with exercise equipment Exercise tolerated well No report of concerns or symptoms today Strength training completed today  Goals Unmet:  Not Applicable  Comments: Service time is from 0812 to [phone].    Dr. Mechele Collin is Medical Director for Pulmonary Rehab at [facility].

## 2023-09-27 ENCOUNTER — Encounter (HOSPITAL_BASED_OUTPATIENT_CLINIC_OR_DEPARTMENT_OTHER): Payer: Medicare PPO

## 2023-09-27 ENCOUNTER — Ambulatory Visit (HOSPITAL_BASED_OUTPATIENT_CLINIC_OR_DEPARTMENT_OTHER): Payer: Medicare PPO | Admitting: Pulmonary Disease

## 2023-09-28 ENCOUNTER — Encounter (HOSPITAL_COMMUNITY)
Admission: RE | Admit: 2023-09-28 | Discharge: 2023-09-28 | Disposition: A | Discharge status: Home / Self Care | Payer: Medicare PPO | Source: Ambulatory Visit | Attending: Pulmonary Disease

## 2023-09-28 DIAGNOSIS — R0602 Shortness of breath: Secondary | ICD-10-CM

## 2023-09-28 NOTE — Progress Notes (Signed)
 Daily Session Note  Patient Details  Name: [name] MRN: [id number] Date of Birth: July 20, 1940 Referring Provider:   Doristine Devoid Pulmonary Rehab Walk Test from 08/07/2023 in [facility] for Heart, Vascular, & Lung Health  Referring Provider Everardo All       Encounter Date: 09/28/2023  Check In:  Session Check In - 09/28/23 0818       Check-In   Supervising physician immediately available to respond to emergencies [facility] MD immediately available    Physician(s) Robin Searing, NP    Location [facility]    Staff Present Raford Pitcher, MS, ACSM-CEP, Exercise Physiologist;Randi Dionisio Paschal, ACSM-CEP, Exercise Physiologist;Casey Synthia Innocent, RN, BSN    Virtual Visit No    Medication changes reported     No    Fall or balance concerns reported    No    Tobacco Cessation No Change    Warm-up and Cool-down Performed as group-led instruction    Resistance Training Performed Yes    VAD Patient? No    PAD/SET Patient? No      Pain Assessment   Currently in Pain? No/denies    Multiple Pain Sites No             Capillary Blood Glucose: No results found for this or any previous visit (from the past 24 hours).   Exercise Prescription Changes - 09/28/23 0900       Home Exercise Plan   Plans to continue exercise at Home (comment)    Frequency --   n/a   Initial Home Exercises Provided 09/28/23             Social History   Tobacco Use  Smoking Status Never  Smokeless Tobacco Never    Goals Met:  Proper associated with RPD/PD & O2 Sat Independence with exercise equipment Exercise tolerated well No report of concerns or symptoms today Strength training completed today  Goals Unmet:  Not Applicable  Comments: Service time is from 0808 to 0932.    Dr. Mechele Collin is Medical Director for Pulmonary Rehab at [facility].

## 2023-09-28 NOTE — Progress Notes (Signed)
 Home Exercise Prescription I have reviewed a Home Exercise Prescription with Javier Glazier. [name] is currently exercising at home. He walks 5-7 days/wk for about 50 min/day. [name] mentioned he walks 1.8 miles but is not pushing himself. I encouraged [name] to increase his speed and complete the 1.8 miles in 40-45 min. [name] agreed with my recommendations. He seems motivated to exercise and improve his functional capacity. I am confident in [name] exercising at home. The patient stated that their goals were to maintain current health. We reviewed exercise guidelines, target heart rate during exercise, RPE Scale, weather conditions, endpoints for exercise, warmup and cool down. The patient is encouraged to come to me with any questions. I will continue to follow up with the patient to assist them with progression and safety. Spent 15 min with patient discussing home exercise plan and goals  Joya San, MS, ACSM-CEP 09/28/2023 9:53 AM

## 2023-10-03 ENCOUNTER — Encounter (HOSPITAL_COMMUNITY)
Admission: RE | Admit: 2023-10-03 | Discharge: 2023-10-03 | Disposition: A | Discharge status: Home / Self Care | Payer: Medicare PPO | Source: Ambulatory Visit | Attending: Pulmonary Disease | Admitting: Pulmonary Disease

## 2023-10-03 ENCOUNTER — Other Ambulatory Visit: Payer: Self-pay

## 2023-10-03 VITALS — Wt 163.8 lb

## 2023-10-03 DIAGNOSIS — R0602 Shortness of breath: Secondary | ICD-10-CM | POA: Diagnosis not present

## 2023-10-03 NOTE — Progress Notes (Signed)
 Daily Session Note  Patient Details  Name: [name] MRN: [id number] Date of Birth: Nov 14, 1940 Referring Provider:   [name] Pulmonary Rehab Walk Test from 08/07/2023 in [facility]  Referring Provider [name]       Encounter Date: 10/03/2023  Check In:  Session Check In - 10/03/23 0820       Check-In   Supervising physician immediately available to respond to emergencies [facility] MD immediately available    Physician(s) Palmer Bobo, NP    Location [facility]    Staff Present Atlas Lea, MS, ACSM-CEP, Exercise Physiologist;Randi Rochelle Chu, ACSM-CEP, Exercise Physiologist;Laquanna Veazey Carmen Chol, RN, BSN    Virtual Visit No    Medication changes reported     No    Fall or balance concerns reported    No    Tobacco Cessation No Change    Warm-up and Cool-down Performed as group-led instruction    Resistance Training Performed Yes    VAD Patient? No    PAD/SET Patient? No      Pain Assessment   Currently in Pain? No/denies    Multiple Pain Sites No             Capillary Blood Glucose: No results found for this or any previous visit (from the past 24 hours).   Exercise Prescription Changes - 10/03/23 0900       Response to Exercise   Blood Pressure (Admit) 136/74    Blood Pressure (Exercise) 146/76    Blood Pressure (Exit) 108/60    Heart Rate (Admit) 53 bpm    Heart Rate (Exercise) 90 bpm    Heart Rate (Exit) 59 bpm    Oxygen Saturation (Admit) 99 %    Oxygen Saturation (Exercise) 97 %    Oxygen Saturation (Exit) 97 %    Rating of Perceived Exertion (Exercise) 12    Perceived Dyspnea (Exercise) 2    Duration Continue with 30 min of aerobic exercise without signs/symptoms of physical distress.    Intensity THRR unchanged      Progression   Progression Continue to progress workloads to maintain intensity without signs/symptoms of physical distress.      Resistance  Training   Training Prescription Yes    Weight black bands    Reps 10-15    Time 10 Minutes      Treadmill   MPH 2.2    Grade 1    Minutes 15    METs 2.8      Recumbant Elliptical   Level 2    Minutes 15    METs 4.5             Social History   Tobacco Use  Smoking Status Never  Smokeless Tobacco Never    Goals Met:  Proper associated with RPD/PD & O2 Sat Independence with exercise equipment Exercise tolerated well No report of concerns or symptoms today Strength training completed today  Goals Unmet:  Not Applicable  Comments: Service time is from 0809 to 0935.    Dr. Genetta Kenning is Medical Director for Pulmonary Rehab at [facility].

## 2023-10-04 NOTE — Progress Notes (Signed)
 Pulmonary Individual Treatment Plan  Patient Details  Name: [name] MRN: [id number] Date of Birth: 1940-07-21 Referring Provider:   [name] Pulmonary Rehab Walk Test from 08/07/2023 in [facility]  Referring Provider [name]       Initial Encounter Date:  Flowsheet Row Pulmonary Rehab Walk Test from 08/07/2023 in [facility]  Date 08/07/23       Visit Diagnosis: Shortness of breath  Patient's Home Medications on Admission:   Current Outpatient Medications:    acalabrutinib maleate (CALQUENCE) 100 MG tablet, Take 1 tablet (100 mg) by mouth daily., Disp: 30 tablet, Rfl: 4   acetaminophen (TYLENOL) 325 MG tablet, Take 325 mg by mouth daily as needed for headache (pain)., Disp: , Rfl:    cholecalciferol (VITAMIN D3) 25 MCG (1000 UNIT) tablet, Take 1,000 Units by mouth daily. , Disp: , Rfl:    clobetasol cream (TEMOVATE) 0.05 %, Apply 1 Application topically as needed. (Patient not taking: Reported on 08/07/2023), Disp: , Rfl:    COVID-19 mRNA vaccine 2023-2024 (COMIRNATY) syringe, Inject 0.3 mLs into the muscle daily., Disp: 0.3 mL, Rfl: 0   EPINEPHrine (EPIPEN) 0.3 mg/0.3 mL DEVI, Inject 0.3 mLs (0.3 mg total) into the muscle once., Disp: 1 Device, Rfl: 0   influenza vaccine adjuvanted (FLUAD) 0.5 ML injection, Inject into the muscle., Disp: 0.5 mL, Rfl: 0   metoprolol succinate (TOPROL-XL) 25 MG 24 hr tablet, TAKE 1 TABLET(25 MG) BY MOUTH DAILY, Disp: 90 tablet, Rfl: 2   olmesartan (BENICAR) 40 MG tablet, Take 40 mg by mouth daily., Disp: , Rfl:    simvastatin (ZOCOR) 40 MG tablet, Take 20 mg by mouth daily. , Disp: , Rfl:   Past Medical History: Past Medical History:  Diagnosis Date   Acute encephalopathy 08/17/2016   Acute renal failure superimposed on stage 3 chronic kidney disease (HCC) 10/15/2018   AKI (acute kidney injury) (HCC) 08/16/2016    Arthritis    Bacteremia due to Enterobacter species 08/19/2016   Carpal tunnel syndrome, bilateral 10/09/2017   Chest tightness 10/15/2018   CLL (chronic lymphocytic leukemia) (HCC) 12/16/2014   Dyspnea 09/20/2012   Elevated troponin 10/16/2018   Enterobacter sepsis (HCC) 08/19/2016   Fall 10/15/2018   GERD (gastroesophageal reflux disease)    Hepatitis 1966   Drug reaction after taking medication   Hyperglycemia    Hyperlipemia    Hypertension    Hypokalemia 08/16/2016   Lung nodule seen on imaging study    bilateral lungs   Nasal bone fracture 10/15/2018   Right foot pain    SAH (subarachnoid hemorrhage) (HCC) 10/15/2018   Syncope 10/15/2018   Thrombocytopenia (HCC) 10/15/2018   Urinary tract infection with hematuria     Tobacco Use: Social History   Tobacco Use  Smoking Status Never  Smokeless Tobacco Never    Labs: Review Flowsheet       Latest Ref Rng & Units 02/03/2011 08/16/2016 10/16/2018  Labs for ITP Cardiac and Pulmonary Rehab  Cholestrol 0 - 200 mg/dL - - 295   LDL (calc) 0 - 99 mg/dL - - 61   HDL-C >28 mg/dL - - 20   Trlycerides <413 mg/dL - - 244   Hemoglobin W1U 4.8 - 5.6 % - - 5.4   PH, Arterial 7.350 - 7.450 - 7.501  -  PCO2 arterial 32.0 - 48.0 mmHg - 25.8  -  Bicarbonate 20.0 - 28.0  mmol/L - 20.0  -  TCO2 0 - 100 mmol/L 23  - -  Acid-base deficit 0.0 - 2.0 mmol/L - 1.7  -  O2 Saturation % - 95.3  -    Capillary Blood Glucose: Lab Results  Component Value Date   GLUCAP 79 10/19/2018   GLUCAP 90 10/19/2018   GLUCAP 88 10/16/2018   GLUCAP 101 (H) 10/16/2018     Pulmonary Assessment Scores:  Pulmonary Assessment Scores     Row Name 08/07/23 1044         ADL UCSD   ADL Phase Entry     SOB Score total 10       CAT Score   CAT Score 4       mMRC Score   mMRC Score 0             UCSD: Self-administered rating of dyspnea associated with activities of daily living (ADLs) 6-point scale (0 = "not at all" to 5 = "maximal or unable to do  because of breathlessness")  Scoring Scores range from 0 to 120.  Minimally important difference is 5 units  CAT: CAT can identify the health impairment of COPD patients and is better correlated with disease progression.  CAT has a scoring range of zero to 40. The CAT score is classified into four groups of low (less than 10), medium (10 - 20), high (21-30) and very high (31-40) based on the impact level of disease on health status. A CAT score over 10 suggests significant symptoms.  A worsening CAT score could be explained by an exacerbation, poor medication adherence, poor inhaler technique, or progression of COPD or comorbid conditions.  CAT MCID is 2 points  mMRC: mMRC (Modified Medical Research Council) Dyspnea Scale is used to assess the degree of baseline functional disability in patients of respiratory disease due to dyspnea. No minimal important difference is established. A decrease in score of 1 point or greater is considered a positive change.   Pulmonary Function Assessment:  Pulmonary Function Assessment - 08/07/23 1044       Breath   Shortness of Breath Yes;Limiting activity             Exercise Target Goals: Exercise Program Goal: Individual exercise prescription set using results from initial 6 min walk test and THRR while considering  patient's activity barriers and safety.   Exercise Prescription Goal: Initial exercise prescription builds to 30-45 minutes a day of aerobic activity, 2-3 days per week.  Home exercise guidelines will be given to patient during program as part of exercise prescription that the participant will acknowledge.  Activity Barriers & Risk Stratification:  Activity Barriers & Cardiac Risk Stratification - 08/07/23 1044       Activity Barriers & Cardiac Risk Stratification   Activity Barriers Arthritis;Muscular Weakness;Deconditioning;Joint Problems             6 Minute Walk:  6 Minute Walk     Row Name 08/07/23 1139         6  Minute Walk   Phase Initial     Distance 1410 feet     Walk Time 6 minutes     # of Rest Breaks 0     MPH 2.67     METS 2.05     RPE 9     Perceived Dyspnea  0.5     VO2 Peak 7.18     Symptoms No     Resting HR 59 bpm  Resting BP 124/72     Resting Oxygen Saturation  100 %     Exercise Oxygen Saturation  during 6 min walk 98 %     Max Ex. HR 74 bpm     Max Ex. BP 138/72     2 Minute Post BP 122/70       Interval HR   1 Minute HR 71     2 Minute HR 72     3 Minute HR 72     4 Minute HR 72     5 Minute HR 73     6 Minute HR 74     2 Minute Post HR 52     Interval Heart Rate? Yes       Interval Oxygen   Interval Oxygen? Yes     Baseline Oxygen Saturation % 100 %     1 Minute Oxygen Saturation % 99 %     1 Minute Liters of Oxygen 0 L     2 Minute Oxygen Saturation % 99 %     2 Minute Liters of Oxygen 0 L     3 Minute Oxygen Saturation % 100 %     3 Minute Liters of Oxygen 0 L     4 Minute Oxygen Saturation % 98 %     4 Minute Liters of Oxygen 0 L     5 Minute Oxygen Saturation % 100 %     5 Minute Liters of Oxygen 0 L     6 Minute Oxygen Saturation % 100 %     6 Minute Liters of Oxygen 0 L     2 Minute Post Oxygen Saturation % 100 %     2 Minute Post Liters of Oxygen 0 L              Oxygen Initial Assessment:  Oxygen Initial Assessment - 08/07/23 1043       Home Oxygen   Home Oxygen Device None    Sleep Oxygen Prescription None    Home Exercise Oxygen Prescription None    Home Resting Oxygen Prescription None      Initial 6 min Walk   Oxygen Used None      Program Oxygen Prescription   Program Oxygen Prescription None      Intervention   Short Term Goals To learn and understand importance of monitoring SPO2 with pulse oximeter and demonstrate accurate use of the pulse oximeter.;To learn and understand importance of maintaining oxygen saturations>88%;To learn and demonstrate proper pursed lip breathing techniques or other breathing techniques. ;To  learn and demonstrate proper use of respiratory medications    Long  Term Goals Verbalizes importance of monitoring SPO2 with pulse oximeter and return demonstration;Maintenance of O2 saturations>88%;Exhibits proper breathing techniques, such as pursed lip breathing or other method taught during program session;Compliance with respiratory medication;Demonstrates proper use of MDI's             Oxygen Re-Evaluation:  Oxygen Re-Evaluation     Row Name 08/08/23 0748 09/25/23 0953           Program Oxygen Prescription   Program Oxygen Prescription None None        Home Oxygen   Home Oxygen Device None None      Sleep Oxygen Prescription None None      Home Exercise Oxygen Prescription None None      Home Resting Oxygen Prescription None None        Goals/Expected Outcomes  Short Term Goals To learn and understand importance of monitoring SPO2 with pulse oximeter and demonstrate accurate use of the pulse oximeter.;To learn and understand importance of maintaining oxygen saturations>88%;To learn and demonstrate proper pursed lip breathing techniques or other breathing techniques. ;To learn and demonstrate proper use of respiratory medications To learn and understand importance of monitoring SPO2 with pulse oximeter and demonstrate accurate use of the pulse oximeter.;To learn and understand importance of maintaining oxygen saturations>88%;To learn and demonstrate proper pursed lip breathing techniques or other breathing techniques. ;To learn and demonstrate proper use of respiratory medications      Long  Term Goals Verbalizes importance of monitoring SPO2 with pulse oximeter and return demonstration;Maintenance of O2 saturations>88%;Exhibits proper breathing techniques, such as pursed lip breathing or other method taught during program session;Compliance with respiratory medication;Demonstrates proper use of MDI's Verbalizes importance of monitoring SPO2 with pulse oximeter and return  demonstration;Maintenance of O2 saturations>88%;Exhibits proper breathing techniques, such as pursed lip breathing or other method taught during program session;Compliance with respiratory medication;Demonstrates proper use of MDI's      Goals/Expected Outcomes Compliance and understanding of oxygen saturation monitoring and breathing techniques to decrease shortness of breath Compliance and understanding of oxygen saturation monitoring and breathing techniques to decrease shortness of breath               Oxygen Discharge (Final Oxygen Re-Evaluation):  Oxygen Re-Evaluation - 09/25/23 0953       Program Oxygen Prescription   Program Oxygen Prescription None      Home Oxygen   Home Oxygen Device None    Sleep Oxygen Prescription None    Home Exercise Oxygen Prescription None    Home Resting Oxygen Prescription None      Goals/Expected Outcomes   Short Term Goals To learn and understand importance of monitoring SPO2 with pulse oximeter and demonstrate accurate use of the pulse oximeter.;To learn and understand importance of maintaining oxygen saturations>88%;To learn and demonstrate proper pursed lip breathing techniques or other breathing techniques. ;To learn and demonstrate proper use of respiratory medications    Long  Term Goals Verbalizes importance of monitoring SPO2 with pulse oximeter and return demonstration;Maintenance of O2 saturations>88%;Exhibits proper breathing techniques, such as pursed lip breathing or other method taught during program session;Compliance with respiratory medication;Demonstrates proper use of MDI's    Goals/Expected Outcomes Compliance and understanding of oxygen saturation monitoring and breathing techniques to decrease shortness of breath             Initial Exercise Prescription:  Initial Exercise Prescription - 08/07/23 1100       Date of Initial Exercise RX and Referring Provider   Date 08/07/23    Referring Provider Everardo All    Expected  Discharge Date 11/02/23      Treadmill   MPH 1.8    Grade 0    Minutes 15      Recumbant Elliptical   Level 1    Minutes 15    METs 2      Prescription Details   Frequency (times per week) 2    Duration Progress to 30 minutes of continuous aerobic without signs/symptoms of physical distress      Intensity   THRR 40-80% of Max Heartrate 55-110    Ratings of Perceived Exertion 11-13    Perceived Dyspnea 0-4      Progression   Progression Continue to progress workloads to maintain intensity without signs/symptoms of physical distress.      Resistance Training  Training Prescription Yes    Weight blue bands    Reps 10-15             Perform Capillary Blood Glucose checks as needed.  Exercise Prescription Changes:   Exercise Prescription Changes     Row Name 08/22/23 0900 09/05/23 0900 09/19/23 0900 09/28/23 0900 10/03/23 0900     Response to Exercise   Blood Pressure (Admit) 136/80 108/66 104/62 -- 136/74   Blood Pressure (Exercise) 130/66 132/60 132/66 -- 146/76   Blood Pressure (Exit) 112/66 100/64 108/58 -- 108/60   Heart Rate (Admit) 51 bpm 50 bpm 63 bpm -- 53 bpm   Heart Rate (Exercise) 72 bpm 73 bpm 79 bpm -- 90 bpm   Heart Rate (Exit) 59 bpm 57 bpm 60 bpm -- 59 bpm   Oxygen Saturation (Admit) 100 % 98 % 98 % -- 99 %   Oxygen Saturation (Exercise) 98 % 98 % 96 % -- 97 %   Oxygen Saturation (Exit) 97 % 98 % 97 % -- 97 %   Rating of Perceived Exertion (Exercise) 13 12.5 12 -- 12   Perceived Dyspnea (Exercise) 2.3 2 2  -- 2   Duration Continue with 30 min of aerobic exercise without signs/symptoms of physical distress. Continue with 30 min of aerobic exercise without signs/symptoms of physical distress. Continue with 30 min of aerobic exercise without signs/symptoms of physical distress. -- Continue with 30 min of aerobic exercise without signs/symptoms of physical distress.   Intensity THRR unchanged THRR unchanged THRR unchanged -- THRR unchanged      Progression   Progression Continue to progress workloads to maintain intensity without signs/symptoms of physical distress. Continue to progress workloads to maintain intensity without signs/symptoms of physical distress. Continue to progress workloads to maintain intensity without signs/symptoms of physical distress. -- Continue to progress workloads to maintain intensity without signs/symptoms of physical distress.   Average METs 2.9 3.5 -- -- --     Resistance Training   Training Prescription Yes Yes Yes -- Yes   Weight blue bands blue bands blue bands -- black bands   Reps 10-15 10-15 10-15 -- 10-15   Time 10 Minutes 10 Minutes 10 Minutes -- 10 Minutes     Treadmill   MPH 1.8 -- 2 -- 2.2   Grade 0 -- 1 -- 1   Minutes 15 -- 15 -- 15   METs 2.2 -- -- -- 2.8     Recumbant Elliptical   Level 1 2 2  -- 2   RPM 50 -- -- -- --   Minutes 15 15 15  -- 15   METs 2.9 3.5 3.9 -- 4.5     Track   Laps -- 13.5 -- -- --   Minutes -- 15 -- -- --   METs -- 3 -- -- --     Home Exercise Plan   Plans to continue exercise at -- -- -- Home (comment) --   Frequency -- -- -- --  n/a --   Initial Home Exercises Provided -- -- -- 09/28/23 --            Exercise Comments:   Exercise Comments     Row Name 08/17/23 0948 09/28/23 0944         Exercise Comments Pt completed first day of group exercise. He exercised on the recumbent elliptical for 15 min on level 1, METs 2.4. He then walked on the treadmill for 15 min, 1.8 mph, 0 incline, METs 2.3. Tolerated well although  admitted to fatigue and that his workout was harder than he walks at home. Performed warm up and cool down without limitations. Discussed METs with good reception. Completed home exercise plan. Raciel is currently exercising at home. He walks 5-7 days/wk for about 50 min/day. Jerald mentioned he walks 1.8 miles but is not pushing himself. I encouraged Kahari to increase his speed and complete the 1.8 miles in 40-45 min. Abbie agreed  with my recommendations. He seems motivated to exercise and improve his functional capacity. I am confident in [place] exercising at home.               Exercise Goals and Review:   Exercise Goals     Row Name 08/07/23 1151             Exercise Goals   Increase Physical Activity Yes       Intervention Provide advice, education, support and counseling about physical activity/exercise needs.;Develop an individualized exercise prescription for aerobic and resistive training based on initial evaluation findings, risk stratification, comorbidities and participant's personal goals.       Expected Outcomes Short Term: Attend rehab on a regular basis to increase amount of physical activity.;Long Term: Exercising regularly at least 3-5 days a week.;Long Term: Add in home exercise to make exercise part of routine and to increase amount of physical activity.       Increase Strength and Stamina Yes       Intervention Provide advice, education, support and counseling about physical activity/exercise needs.;Develop an individualized exercise prescription for aerobic and resistive training based on initial evaluation findings, risk stratification, comorbidities and participant's personal goals.       Expected Outcomes Short Term: Increase workloads from initial exercise prescription for resistance, speed, and METs.;Short Term: Perform resistance training exercises routinely during rehab and add in resistance training at home;Long Term: Improve cardiorespiratory fitness, muscular endurance and strength as measured by increased METs and functional capacity ( )       Able to understand and use rate of perceived exertion (RPE) scale Yes       Intervention Provide education and explanation on how to use RPE scale       Expected Outcomes Short Term: Able to use RPE daily in rehab to express subjective intensity level;Long Term:  Able to use RPE to guide intensity level when exercising independently        Able to understand and use Dyspnea scale Yes       Intervention Provide education and explanation on how to use Dyspnea scale       Expected Outcomes Short Term: Able to use Dyspnea scale daily in rehab to express subjective sense of shortness of breath during exertion;Long Term: Able to use Dyspnea scale to guide intensity level when exercising independently       Knowledge and understanding of Target Heart Rate Range (THRR) Yes       Intervention Provide education and explanation of THRR including how the numbers were predicted and where they are located for reference       Expected Outcomes Short Term: Able to state/look up THRR;Long Term: Able to use THRR to govern intensity when exercising independently;Short Term: Able to use daily as guideline for intensity in rehab       Understanding of Exercise Prescription Yes       Intervention Provide education, explanation, and written materials on patient's individual exercise prescription       Expected Outcomes Short Term: Able to explain program exercise  prescription;Long Term: Able to explain home exercise prescription to exercise independently                Exercise Goals Re-Evaluation :  Exercise Goals Re-Evaluation     Row Name 08/08/23 0747 08/30/23 0940 09/25/23 0951         Exercise Goal Re-Evaluation   Exercise Goals Review Increase Physical Activity;Able to understand and use Dyspnea scale;Understanding of Exercise Prescription;Increase Strength and Stamina;Knowledge and understanding of Target Heart Rate Range (THRR);Able to understand and use rate of perceived exertion (RPE) scale Increase Physical Activity;Able to understand and use Dyspnea scale;Understanding of Exercise Prescription;Increase Strength and Stamina;Knowledge and understanding of Target Heart Rate Range (THRR);Able to understand and use rate of perceived exertion (RPE) scale Increase Physical Activity;Able to understand and use Dyspnea scale;Understanding of  Exercise Prescription;Increase Strength and Stamina;Knowledge and understanding of Target Heart Rate Range (THRR);Able to understand and use rate of perceived exertion (RPE) scale     Comments Kylie is scheduled to begin exercise on 2/27. Will monitor and progress as able. Abdoulaye has completed 4 exercise sessions. He exercises for 15 min on the recumbent elliptical and treadmill. Mellody Dance averages 3.7 METs at level 1 on the recumbent elliptical and 2.3 METs at 1.8 mph on the treadmill. He performs the warmup and cooldown standing without limitations. It is too soon to notate any discernable progressions. Will continue to monitor and progress as able. Caidence has completed 11 exercise sessions. He exercises for 15 min on the recumbent elliptical and treadmill. Mellody Dance averages 4.0 METs at level 2 on the recumbent elliptical and 2.6 METs at 2 mph and 1% incline on the treadmill. He performs the warmup and cooldown standing without limitations. Benford has increased his level for the recumbent elliptical and speed and incline on the treadmill. METs have increased for both exercise modes. Vander tolerates progressions well. Will continue to monitor and progress as able.     Expected Outcomes Though exercise at rehab and home, the patient will decrease shortness of breath with daily activities and feel confident in carrying out an exercise regimen at home. Though exercise at rehab and home, the patient will decrease shortness of breath with daily activities and feel confident in carrying out an exercise regimen at home. Though exercise at rehab and home, the patient will decrease shortness of breath with daily activities and feel confident in carrying out an exercise regimen at home.              Discharge Exercise Prescription (Final Exercise Prescription Changes):  Exercise Prescription Changes - 10/03/23 0900       Response to Exercise   Blood Pressure (Admit) 136/74    Blood Pressure (Exercise) 146/76    Blood  Pressure (Exit) 108/60    Heart Rate (Admit) 53 bpm    Heart Rate (Exercise) 90 bpm    Heart Rate (Exit) 59 bpm    Oxygen Saturation (Admit) 99 %    Oxygen Saturation (Exercise) 97 %    Oxygen Saturation (Exit) 97 %    Rating of Perceived Exertion (Exercise) 12    Perceived Dyspnea (Exercise) 2    Duration Continue with 30 min of aerobic exercise without signs/symptoms of physical distress.    Intensity THRR unchanged      Progression   Progression Continue to progress workloads to maintain intensity without signs/symptoms of physical distress.      Resistance Training   Training Prescription Yes    Weight black bands  Reps 10-15    Time 10 Minutes      Treadmill   MPH 2.2    Grade 1    Minutes 15    METs 2.8      Recumbant Elliptical   Level 2    Minutes 15    METs 4.5             Nutrition:  Target Goals: Understanding of nutrition guidelines, daily intake of sodium 1500mg , cholesterol 200mg , calories 30% from fat and 7% or less from saturated fats, daily to have 5 or more servings of fruits and vegetables.  Biometrics:    Nutrition Therapy Plan and Nutrition Goals:  Nutrition Therapy & Goals - 09/28/23 0939       Nutrition Therapy   Diet general healthy diet      Personal Nutrition Goals   Nutrition Goal Patient to improve diet quality by using the plate method as a guide for meal planning to include lean protein/plant protein, fruits, vegetables, whole grains, nonfat dairy as part of a well-balanced diet.   goal in action.   Comments Goals in action. Moosa has medical history of CAD, HTN, CLL, CKD3, shortness of breath. Per cardiology notes, he continues mindfulness of sodum and saturated fat intake. He continues to monitor blood pressure at home. LDL remains at goal.He is [id number]# since starting with our program. Carmon will benefit from participation in pulmonary rehab for nutrition and exercise support.      Intervention Plan   Intervention  Prescribe, educate and counsel regarding individualized specific dietary modifications aiming towards targeted core components such as weight, hypertension, lipid management, diabetes, heart failure and other comorbidities.;Nutrition handout(s) given to patient.    Expected Outcomes Short Term Goal: Understand basic principles of dietary content, such as calories, fat, sodium, cholesterol and nutrients.;Long Term Goal: Adherence to prescribed nutrition plan.             Nutrition Assessments:  MEDIFICTS Score Key: >=70 Need to make dietary changes  40-70 Heart Healthy Diet <= 40 Therapeutic Level Cholesterol Diet   Picture Your Plate Scores: <91 Unhealthy dietary pattern with much room for improvement. 41-50 Dietary pattern unlikely to meet recommendations for good health and room for improvement. 51-60 More healthful dietary pattern, with some room for improvement.  >60 Healthy dietary pattern, although there may be some specific behaviors that could be improved.    Nutrition Goals Re-Evaluation:  Nutrition Goals Re-Evaluation     Row Name 08/29/23 1039 09/28/23 0939           Goals   Current Weight -- 164 lb 3.9 oz (74.5 kg)      Comment lipids WNL, LDL 67 no new labs; most recent labs  lipids WNL, LDL 67      Expected Outcome Tarig has medical history of CAD, HTN, CLL, CKD3, shortness of breath. Per cardiology notes, he continues mindfulness of sodum and saturated fat intake. He continues to monitor blood pressure at home. LDL remains at goal. Hoa will benefit from participation in pulmonary rehab for nutrition and exercise support. Goals in action. Corde has medical history of CAD, HTN, CLL, CKD3, shortness of breath. Per cardiology notes, he continues mindfulness of sodum and saturated fat intake. He continues to monitor blood pressure at home. LDL remains at goal.He is YNWG9.5# since starting with our program. Dannon will benefit from participation in pulmonary rehab for  nutrition and exercise support.  Nutrition Goals Discharge (Final Nutrition Goals Re-Evaluation):  Nutrition Goals Re-Evaluation - 09/28/23 0939       Goals   Current Weight 164 lb 3.9 oz (74.5 kg)    Comment no new labs; most recent labs  lipids WNL, LDL 67    Expected Outcome Goals in action. Vijay has medical history of CAD, HTN, CLL, CKD3, shortness of breath. Per cardiology notes, he continues mindfulness of sodum and saturated fat intake. He continues to monitor blood pressure at home. LDL remains at goal.He is WUXL2.4# since starting with our program. Melburn will benefit from participation in pulmonary rehab for nutrition and exercise support.             Psychosocial: Target Goals: Acknowledge presence or absence of significant depression and/or stress, maximize coping skills, provide positive support system. Participant is able to verbalize types and ability to use techniques and skills needed for reducing stress and depression.  Initial Review & Psychosocial Screening:  Initial Psych Review & Screening - 08/07/23 1038       Initial Review   Current issues with None Identified      Family Dynamics   Good Support System? Yes    Comments spouse, daughter      Barriers   Psychosocial barriers to participate in program There are no identifiable barriers or psychosocial needs.      Screening Interventions   Interventions Encouraged to exercise             Quality of Life Scores:  Scores of 19 and below usually indicate a poorer quality of life in these areas.  A difference of  2-3 points is a clinically meaningful difference.  A difference of 2-3 points in the total score of the Quality of Life Index has been associated with significant improvement in overall quality of life, self-image, physical symptoms, and general health in studies assessing change in quality of life.  PHQ-9: Review Flowsheet       08/07/2023  Depression screen PHQ 2/9   Decreased Interest 0  Down, Depressed, Hopeless 0  PHQ - 2 Score 0  Altered sleeping 0  Tired, decreased energy 0  Change in appetite 0  Feeling bad or failure about yourself  0  Trouble concentrating 0  Moving slowly or fidgety/restless 0  Suicidal thoughts 0  PHQ-9 Score 0  Difficult doing work/chores Not difficult at all   Interpretation of Total Score  Total Score Depression Severity:  1-4 = Minimal depression, 5-9 = Mild depression, 10-14 = Moderate depression, 15-19 = Moderately severe depression, 20-27 = Severe depression   Psychosocial Evaluation and Intervention:  Psychosocial Evaluation - 08/07/23 1039       Psychosocial Evaluation & Interventions   Interventions Encouraged to exercise with the program and follow exercise prescription    Comments Malik denies any psychosocial barriers at this time.    Expected Outcomes For Bodi to participate in rehab free of psychosocial concerns.    Continue Psychosocial Services  No Follow up required             Psychosocial Re-Evaluation:  Psychosocial Re-Evaluation     Row Name 08/08/23 4010 08/28/23 1553 09/27/23 1053         Psychosocial Re-Evaluation   Current issues with None Identified None Identified None Identified     Comments No changes since orientation. [place] is scheduled to start the program next week Aeon continues to deny any psychosocial barriers or concerns a thtis time. Psychosocial monthly re-evaluation is  as follows: Vuong continues to deny any psychosocial barriers or concerns a thtis time.     Expected Outcomes For Willow to participate in rehab free of psychosocial barriers  or concerns For Tia to participate in rehab free of psychosocial barriers  or concerns For Mycheal to participate in rehab free of psychosocial barriers  or concerns     Interventions Encouraged to attend Pulmonary Rehabilitation for the exercise Encouraged to attend Pulmonary Rehabilitation for the exercise Encouraged to attend  Pulmonary Rehabilitation for the exercise     Continue Psychosocial Services  No Follow up required No Follow up required No Follow up required              Psychosocial Discharge (Final Psychosocial Re-Evaluation):  Psychosocial Re-Evaluation - 09/27/23 1053       Psychosocial Re-Evaluation   Current issues with None Identified    Comments Psychosocial monthly re-evaluation is as follows: Robinson continues to deny any psychosocial barriers or concerns a thtis time.    Expected Outcomes For Helios to participate in rehab free of psychosocial barriers  or concerns    Interventions Encouraged to attend Pulmonary Rehabilitation for the exercise    Continue Psychosocial Services  No Follow up required             Education: Education Goals: Education classes will be provided on a weekly basis, covering required topics. Participant will state understanding/return demonstration of topics presented.  Learning Barriers/Preferences:  Learning Barriers/Preferences - 08/07/23 1040       Learning Barriers/Preferences   Learning Barriers Sight    Learning Preferences None             Education Topics: Know Your Numbers Group instruction that is supported by a PowerPoint presentation. Instructor discusses importance of knowing and understanding resting, exercise, and post-exercise oxygen saturation, heart rate, and blood pressure. Oxygen saturation, heart rate, blood pressure, rating of perceived exertion, and dyspnea are reviewed along with a normal range for these values.  Flowsheet Row PULMONARY REHAB OTHER RESPIRATORY from 09/21/2023 in [facility] for Heart, Vascular, & Lung Health  Date 09/21/23  Educator EP  Instruction Review Code 1- Verbalizes Understanding       Exercise for the Pulmonary Patient Group instruction that is supported by a PowerPoint presentation. Instructor discusses benefits of exercise, core components of exercise, frequency,  duration, and intensity of an exercise routine, importance of utilizing pulse oximetry during exercise, safety while exercising, and options of places to exercise outside of rehab.  Flowsheet Row PULMONARY REHAB OTHER RESPIRATORY from 09/14/2023 in [facility] for Heart, Vascular, & Lung Health  Date 09/14/23  Educator EP  Instruction Review Code 1- Verbalizes Understanding       MET Level  Group instruction provided by PowerPoint, verbal discussion, and written material to support subject matter. Instructor reviews what METs are and how to increase METs.    Pulmonary Medications Verbally interactive group education provided by instructor with focus on inhaled medications and proper administration. Flowsheet Row PULMONARY REHAB OTHER RESPIRATORY from 09/07/2023 in [facility] for Heart, Vascular, & Lung Health  Date 09/07/23  Educator RT  Instruction Review Code 1- Verbalizes Understanding       Anatomy and Physiology of the Respiratory System Group instruction provided by PowerPoint, verbal discussion, and written material to support subject matter. Instructor reviews respiratory cycle and anatomical components of the respiratory system and their functions. Instructor also reviews differences in obstructive and restrictive  respiratory diseases with examples of each.  Flowsheet Row PULMONARY REHAB OTHER RESPIRATORY from 08/24/2023 in [facility] for Heart, Vascular, & Lung Health  Date 08/24/23  Educator RT  Instruction Review Code 1- Verbalizes Understanding       Oxygen Safety Group instruction provided by PowerPoint, verbal discussion, and written material to support subject matter. There is an overview of "What is Oxygen" and "Why do we need it".  Instructor also reviews how to create a safe environment for oxygen use, the importance of using oxygen as prescribed, and the risks of noncompliance. There is a brief  discussion on traveling with oxygen and resources the patient may utilize. Flowsheet Row PULMONARY REHAB OTHER RESPIRATORY from 09/28/2023 in [facility] for Heart, Vascular, & Lung Health  Date 09/28/23  Educator EP  Instruction Review Code 1- Verbalizes Understanding       Oxygen Use Group instruction provided by PowerPoint, verbal discussion, and written material to discuss how supplemental oxygen is prescribed and different types of oxygen supply systems. Resources for more information are provided.    Breathing Techniques Group instruction that is supported by demonstration and informational handouts. Instructor discusses the benefits of pursed lip and diaphragmatic breathing and detailed demonstration on how to perform both.     Risk Factor Reduction Group instruction that is supported by a PowerPoint presentation. Instructor discusses the definition of a risk factor, different risk factors for pulmonary disease, and how the heart and lungs work together.   Pulmonary Diseases Group instruction provided by PowerPoint, verbal discussion, and written material to support subject matter. Instructor gives an overview of the different type of pulmonary diseases. There is also a discussion on risk factors and symptoms as well as ways to manage the diseases. Flowsheet Row PULMONARY REHAB OTHER RESPIRATORY from 08/17/2023 in [name] for Heart, Vascular, & Lung Health  Date 08/17/23  Educator RT  Instruction Review Code 1- Verbalizes Understanding       Stress and Energy Conservation Group instruction provided by PowerPoint, verbal discussion, and written material to support subject matter. Instructor gives an overview of stress and the impact it can have on the body. Instructor also reviews ways to reduce stress. There is also a discussion on energy conservation and ways to conserve energy throughout the day.   Warning Signs and  Symptoms Group instruction provided by PowerPoint, verbal discussion, and written material to support subject matter. Instructor reviews warning signs and symptoms of stroke, heart attack, cold and flu. Instructor also reviews ways to prevent the spread of infection.   Other Education Group or individual verbal, written, or video instructions that support the educational goals of the pulmonary rehab program.    Knowledge Questionnaire Score:  Knowledge Questionnaire Score - 08/07/23 1146       Knowledge Questionnaire Score   Pre Score 10/18             Core Components/Risk Factors/Patient Goals at Admission:  Personal Goals and Risk Factors at Admission - 08/07/23 1043       Core Components/Risk Factors/Patient Goals on Admission   Improve shortness of breath with ADL's Yes    Intervention Provide education, individualized exercise plan and daily activity instruction to help decrease symptoms of SOB with activities of daily living.    Expected Outcomes Short Term: Improve cardiorespiratory fitness to achieve a reduction of symptoms when performing ADLs;Long Term: Be able to perform more ADLs without symptoms or delay the onset  of symptoms             Core Components/Risk Factors/Patient Goals Review:   Goals and Risk Factor Review     Row Name 08/08/23 0920 08/28/23 1554 09/27/23 1053         Core Components/Risk Factors/Patient Goals Review   Personal Goals Review Improve shortness of breath with ADL's;Develop more efficient breathing techniques such as purse lipped breathing and diaphragmatic breathing and practicing self-pacing with activity. Improve shortness of breath with ADL's;Develop more efficient breathing techniques such as purse lipped breathing and diaphragmatic breathing and practicing self-pacing with activity. Improve shortness of breath with ADL's;Develop more efficient breathing techniques such as purse lipped breathing and diaphragmatic breathing and  practicing self-pacing with activity.     Review Unable to assess, Avyaan has not started the program yet. He is scheduled to start the program next week. Goal progressing for improving shortness of breath. Forbes is currently able to maintain sats >88% on RA while exercising. He is currently exercising on the treadmill and the recumbant elliptical. Goal progressing for developing more efficient breathing techniques such as purse lipped breathing and diaphragmatic breathing; and practicing self-pacing with activity. We will continue to monitor Lessie's progress throughout the program. Monthly review of patient's Core Components/Risk Factors/Patient Goals are as follows: Goal progressing for improving shortness of breath. Brice is currently able to maintain sats >88% on RA while exercising. He is currently exercising on the treadmill and the recumbent elliptical. He has increased his METs and workload with dyspnea WNL. Goal met for developing more efficient breathing techniques such as purse lipped breathing and diaphragmatic breathing; and practicing self-pacing with activity. Alaa can initiate PLB on his own and practices diaphragmatic breathing at home. Guiseppe can self-pace when dyspneic. We will continue to monitor Nilay's progress throughout the program.     Expected Outcomes For Jailen to improve his shortness of breath with ADLs and develop more efficient breathing techniques such as purse lipped breathing and diaphragmatic breathing; and practicing self-pacing with activity For Dasan to improve his shortness of breath with ADLs and develop more efficient breathing techniques such as purse lipped breathing and diaphragmatic breathing; and practicing self-pacing with activity For Ulis to improve his shortness of breath with ADLs              Core Components/Risk Factors/Patient Goals at Discharge (Final Review):   Goals and Risk Factor Review - 09/27/23 1053       Core Components/Risk Factors/Patient  Goals Review   Personal Goals Review Improve shortness of breath with ADL's;Develop more efficient breathing techniques such as purse lipped breathing and diaphragmatic breathing and practicing self-pacing with activity.    Review Monthly review of patient's Core Components/Risk Factors/Patient Goals are as follows: Goal progressing for improving shortness of breath. Mercy is currently able to maintain sats >88% on RA while exercising. He is currently exercising on the treadmill and the recumbent elliptical. He has increased his METs and workload with dyspnea WNL. Goal met for developing more efficient breathing techniques such as purse lipped breathing and diaphragmatic breathing; and practicing self-pacing with activity. Fallon can initiate PLB on his own and practices diaphragmatic breathing at home. Ryver can self-pace when dyspneic. We will continue to monitor Elyas's progress throughout the program.    Expected Outcomes For Eldrige to improve his shortness of breath with ADLs             ITP Comments:Pt is making expected progress toward Pulmonary Rehab goals after completing 14  session(s). Recommend continued exercise, life style modification, education, and utilization of breathing techniques to increase stamina and strength, while also decreasing shortness of breath with exertion.  Dr. Genetta Kenning is Medical Director for Pulmonary Rehab at [facility].     Comments:

## 2023-10-05 ENCOUNTER — Other Ambulatory Visit: Payer: Self-pay | Admitting: Pharmacy Technician

## 2023-10-05 ENCOUNTER — Encounter (HOSPITAL_COMMUNITY)
Admission: RE | Admit: 2023-10-05 | Discharge: 2023-10-05 | Disposition: A | Discharge status: Home / Self Care | Payer: Medicare PPO | Source: Ambulatory Visit | Attending: Pulmonary Disease | Admitting: Pulmonary Disease

## 2023-10-05 ENCOUNTER — Other Ambulatory Visit (HOSPITAL_COMMUNITY): Payer: Self-pay

## 2023-10-05 ENCOUNTER — Other Ambulatory Visit: Payer: Self-pay

## 2023-10-05 DIAGNOSIS — R0602 Shortness of breath: Secondary | ICD-10-CM | POA: Diagnosis not present

## 2023-10-05 NOTE — Progress Notes (Signed)
 Daily Session Note  Patient Details  Name: [name] MRN: [id number] Date of Birth: 29-May-1941 Referring Provider:   [name] Pulmonary Rehab Walk Test from 08/07/2023 in [facility] for Heart, Vascular, & Lung Health  Referring Provider [name]       Encounter Date: 10/05/2023  Check In:  Session Check In - 10/05/23 0816       Check-In   Supervising physician immediately available to respond to emergencies [facility] MD immediately available    Physician(s) Rejeana Card, NP    Location [facility]    Staff Present Atlas Lea, MS, ACSM-CEP, Exercise Physiologist;Randi Rochelle Chu, ACSM-CEP, Exercise Physiologist;Valaree Fresquez Carmen Chol, RN, BSN    Virtual Visit No    Medication changes reported     No    Fall or balance concerns reported    No    Tobacco Cessation No Change    Warm-up and Cool-down Performed as group-led instruction    Resistance Training Performed Yes    VAD Patient? No    PAD/SET Patient? No      Pain Assessment   Currently in Pain? No/denies    Pain Score 0-No pain    Multiple Pain Sites No             Capillary Blood Glucose: No results found for this or any previous visit (from the past 24 hours).    Social History   Tobacco Use  Smoking Status Never  Smokeless Tobacco Never    Goals Met:  Proper associated with RPD/PD & O2 Sat Independence with exercise equipment Exercise tolerated well No report of concerns or symptoms today Strength training completed today  Goals Unmet:  Not Applicable  Comments: Service time is from 0810 to 0930.    Dr. Genetta Kenning is Medical Director for Pulmonary Rehab at [facility].

## 2023-10-05 NOTE — Progress Notes (Signed)
 Specialty Pharmacy Refill Coordination Note  [name] is a 83 y.o. male contacted today regarding refills of specialty medication(s) Acalabrutinib Maleate (Calquence)   Patient requested Delivery   Delivery date: 10/17/23   Verified address: [address] [place]   Medication will be filled on 10/16/23.

## 2023-10-10 ENCOUNTER — Encounter (HOSPITAL_COMMUNITY)
Admission: RE | Admit: 2023-10-10 | Discharge: 2023-10-10 | Disposition: A | Discharge status: Home / Self Care | Payer: Medicare PPO | Source: Ambulatory Visit | Attending: Pulmonary Disease | Admitting: Pulmonary Disease

## 2023-10-10 DIAGNOSIS — R0602 Shortness of breath: Secondary | ICD-10-CM | POA: Diagnosis not present

## 2023-10-10 NOTE — Progress Notes (Signed)
 Daily Session Note  Patient Details  Name: MARCIAL PLESS MRN: [id number] Date of Birth: 18-Nov-1940 Referring Provider:   [name] Pulmonary Rehab Walk Test from 08/07/2023 in [facility] for Heart, Vascular, & Lung Health  Referring Provider [name]       Encounter Date: 10/10/2023  Check In:  Session Check In - 10/10/23 4696       Check-In   Supervising physician immediately available to respond to emergencies [facility] MD immediately available    Physician(s) Marlana Silvan, NP    Location [facility]    Staff Present Atlas Lea, MS, ACSM-CEP, Exercise Physiologist;Randi Rochelle Chu, ACSM-CEP, Exercise Physiologist;Edrik Rundle Felipe Horton, RT    Virtual Visit No    Medication changes reported     No    Fall or balance concerns reported    No    Tobacco Cessation No Change    Warm-up and Cool-down Performed as group-led instruction    Resistance Training Performed Yes    VAD Patient? No    PAD/SET Patient? No      Pain Assessment   Currently in Pain? No/denies    Multiple Pain Sites No             Capillary Blood Glucose: No results found for this or any previous visit (from the past 24 hours).    Social History   Tobacco Use  Smoking Status Never  Smokeless Tobacco Never    Goals Met:  Proper associated with RPD/PD & O2 Sat Independence with exercise equipment Exercise tolerated well No report of concerns or symptoms today Strength training completed today  Goals Unmet:  Not Applicable  Comments: Service time is from 0805 to 0927.    Dr. Genetta Kenning is Medical Director for Pulmonary Rehab at [facility].

## 2023-10-12 ENCOUNTER — Other Ambulatory Visit (HOSPITAL_BASED_OUTPATIENT_CLINIC_OR_DEPARTMENT_OTHER): Payer: Self-pay

## 2023-10-12 ENCOUNTER — Encounter (HOSPITAL_COMMUNITY)
Admission: RE | Admit: 2023-10-12 | Discharge: 2023-10-12 | Disposition: A | Discharge status: Home / Self Care | Payer: Medicare PPO | Source: Ambulatory Visit | Attending: Pulmonary Disease

## 2023-10-12 DIAGNOSIS — R0602 Shortness of breath: Secondary | ICD-10-CM | POA: Diagnosis not present

## 2023-10-12 MED ORDER — COMIRNATY 30 MCG/0.3ML IM SUSY
0.3000 mL | PREFILLED_SYRINGE | Freq: Once | INTRAMUSCULAR | 0 refills | Status: AC
  Filled 2023-10-12: qty 0.3, 1d supply, fill #0

## 2023-10-12 NOTE — Progress Notes (Signed)
 Daily Session Note  Patient Details  Name: [name] MRN: [id number] Date of Birth: 1940-09-05 Referring Provider:   [name] Pulmonary Rehab Walk Test from 08/07/2023 in [facility] for Heart, Vascular, & Lung Health  Referring Provider [name]       Encounter Date: 10/12/2023  Check In:  Session Check In - 10/12/23 0820       Check-In   Supervising physician immediately available to respond to emergencies [facility] MD immediately available    Physician(s) Marlana Silvan, NP    Location [facility]    Staff Present Atlas Lea, MS, ACSM-CEP, Exercise Physiologist;[employer] Rochelle Chu, ACSM-CEP, Exercise Physiologist;Casey Carmen Chol, RN, BSN    Virtual Visit No    Medication changes reported     No    Fall or balance concerns reported    No    Tobacco Cessation No Change    Warm-up and Cool-down Performed as group-led instruction    Resistance Training Performed Yes    VAD Patient? No    PAD/SET Patient? No      Pain Assessment   Currently in Pain? No/denies    Pain Score 0-No pain    Multiple Pain Sites No             Capillary Blood Glucose: No results found for this or any previous visit (from the past 24 hours).    Social History   Tobacco Use  Smoking Status Never  Smokeless Tobacco Never    Goals Met:  Independence with exercise equipment Exercise tolerated well No report of concerns or symptoms today Strength training completed today  Goals Unmet:  Not Applicable  Comments: Service time is from 0810 to 0926.    Dr. Genetta Kenning is Medical Director for Pulmonary Rehab at [facility].

## 2023-10-14 ENCOUNTER — Other Ambulatory Visit (HOSPITAL_COMMUNITY): Payer: Self-pay

## 2023-10-17 ENCOUNTER — Encounter (HOSPITAL_COMMUNITY)
Admission: RE | Admit: 2023-10-17 | Discharge: 2023-10-17 | Disposition: A | Discharge status: Home / Self Care | Payer: Medicare PPO | Source: Ambulatory Visit | Attending: Pulmonary Disease | Admitting: Pulmonary Disease

## 2023-10-17 VITALS — Wt 162.9 lb

## 2023-10-17 DIAGNOSIS — R0602 Shortness of breath: Secondary | ICD-10-CM | POA: Diagnosis not present

## 2023-10-17 NOTE — Progress Notes (Addendum)
 Daily Session Note  Patient Details  Name: [name] MRN: [id number] Date of Birth: 05/16/1941 Referring Provider:   [name] Pulmonary Rehab Walk Test from 08/07/2023 in [facility] for Heart, Vascular, & Lung Health  Referring Provider [name]       Encounter Date: 10/17/2023  Check In:  Session Check In - 10/17/23 0825       Check-In   Supervising physician immediately available to respond to emergencies [facility] MD immediately available    Physician(s) Lawana Pray, NP    Location [facility]    Staff Present Atlas Lea, MS, ACSM-CEP, Exercise Physiologist;Chrishawn Boley Rochelle Chu, ACSM-CEP, Exercise Physiologist;Casey Carmen Chol, RN, BSN    Virtual Visit No    Medication changes reported     No    Fall or balance concerns reported    No    Tobacco Cessation No Change    Warm-up and Cool-down Performed as group-led instruction    Resistance Training Performed Yes    VAD Patient? No    PAD/SET Patient? No      Pain Assessment   Currently in Pain? No/denies    Pain Score 0-No pain    Multiple Pain Sites No             Capillary Blood Glucose: No results found for this or any previous visit (from the past 24 hours).   Exercise Prescription Changes - 10/17/23 0900       Response to Exercise   Blood Pressure (Admit) 130/60    Blood Pressure (Exercise) 132/64    Blood Pressure (Exit) 104/52    Heart Rate (Admit) 51 bpm    Heart Rate (Exercise) 79 bpm    Heart Rate (Exit) 61 bpm    Oxygen Saturation (Admit) 99 %    Oxygen Saturation (Exercise) 99 %    Oxygen Saturation (Exit) 98 %    Rating of Perceived Exertion (Exercise) 12    Perceived Dyspnea (Exercise) 2    Duration Continue with 30 min of aerobic exercise without signs/symptoms of physical distress.    Intensity THRR unchanged      Progression   Progression Continue to progress workloads to maintain intensity without signs/symptoms of physical  distress.      Resistance Training   Training Prescription Yes    Weight black bands    Reps 10-15    Time 10 Minutes      Treadmill   MPH 2.3    Grade 1    Minutes 15    METs 2.9      Recumbant Elliptical   Level 3    RPM 63    Watts 84    Minutes 15    METs 1.3             Social History   Tobacco Use  Smoking Status Never  Smokeless Tobacco Never    Goals Met:  Exercise tolerated well No report of concerns or symptoms today Strength training completed today  Goals Unmet:  Not Applicable  Comments: Service time is from 0812 to [phone].    Dr. Genetta Kenning is Medical Director for Pulmonary Rehab at [facility].

## 2023-10-19 ENCOUNTER — Encounter (HOSPITAL_COMMUNITY)
Admission: RE | Admit: 2023-10-19 | Discharge: 2023-10-19 | Disposition: A | Discharge status: Home / Self Care | Payer: Medicare PPO | Source: Ambulatory Visit | Attending: Pulmonary Disease | Admitting: Pulmonary Disease

## 2023-10-19 DIAGNOSIS — R0602 Shortness of breath: Secondary | ICD-10-CM | POA: Diagnosis not present

## 2023-10-19 NOTE — Progress Notes (Signed)
 Daily Session Note  Patient Details  Name: [name] MRN: [id number] Date of Birth: 18-Feb-1941 Referring Provider:   [name] Pulmonary Rehab Walk Test from 08/07/2023 in [facility]  Referring Provider [name]       Encounter Date: 10/19/2023  Check In:  Session Check In - 10/19/23 4742       Check-In   Supervising physician immediately available to respond to emergencies [facility] MD immediately available    Physician(s) Koren Persons, NP    Location [facility]    Staff Present Atlas Lea, MS, ACSM-CEP, Exercise Physiologist;Randi Rochelle Chu, ACSM-CEP, Exercise Physiologist;Casey Carmen Chol, RN, BSN    Virtual Visit No    Medication changes reported     No    Fall or balance concerns reported    No    Tobacco Cessation No Change    Warm-up and Cool-down Performed as group-led instruction    Resistance Training Performed Yes    VAD Patient? No    PAD/SET Patient? No      Pain Assessment   Currently in Pain? No/denies    Multiple Pain Sites No             Capillary Blood Glucose: No results found for this or any previous visit (from the past 24 hours).    Social History   Tobacco Use  Smoking Status Never  Smokeless Tobacco Never    Goals Met:  Proper associated with RPD/PD & O2 Sat Independence with exercise equipment Exercise tolerated well No report of concerns or symptoms today Strength training completed today  Goals Unmet:  Not Applicable  Comments: Service time is from 0811 to [phone].    Dr. Genetta Kenning is Medical Director for Pulmonary Rehab at [facility].

## 2023-10-24 ENCOUNTER — Encounter (HOSPITAL_COMMUNITY)
Admission: RE | Admit: 2023-10-24 | Discharge: 2023-10-24 | Disposition: A | Discharge status: Home / Self Care | Payer: Medicare PPO | Source: Ambulatory Visit | Attending: Pulmonary Disease | Admitting: Pulmonary Disease

## 2023-10-24 DIAGNOSIS — R0602 Shortness of breath: Secondary | ICD-10-CM | POA: Diagnosis not present

## 2023-10-24 NOTE — Progress Notes (Signed)
 Daily Session Note  Patient Details  Name: AMOND TOUTANT MRN: [id number] Date of Birth: 1941/01/08 Referring Provider:   [name] Pulmonary Rehab Walk Test from 08/07/2023 in [facility]  Referring Provider [name]       Encounter Date: 10/24/2023  Check In:  Session Check In - 10/24/23 4098       Check-In   Supervising physician immediately available to respond to emergencies [facility] MD immediately available    Physician(s) Theresia Flasher, NP    Location [facility]    Staff Present Atlas Lea, MS, ACSM-CEP, Exercise Physiologist;Randi Rochelle Chu, ACSM-CEP, Exercise Physiologist;Casey Carmen Chol, RN, BSN    Virtual Visit No    Medication changes reported     No    Fall or balance concerns reported    No    Tobacco Cessation No Change    Warm-up and Cool-down Performed as group-led instruction    Resistance Training Performed Yes    VAD Patient? No    PAD/SET Patient? No      Pain Assessment   Currently in Pain? No/denies    Multiple Pain Sites No             Capillary Blood Glucose: No results found for this or any previous visit (from the past 24 hours).    Social History   Tobacco Use  Smoking Status Never  Smokeless Tobacco Never    Goals Met:  Exercise tolerated well No report of concerns or symptoms today Strength training completed today  Goals Unmet:  Not Applicable  Comments: Service time is from 0809 to 0928    Dr. Genetta Kenning is Medical Director for Pulmonary Rehab at [facility].

## 2023-10-25 ENCOUNTER — Ambulatory Visit (HOSPITAL_BASED_OUTPATIENT_CLINIC_OR_DEPARTMENT_OTHER): Admitting: Pulmonary Disease

## 2023-10-25 ENCOUNTER — Encounter (HOSPITAL_BASED_OUTPATIENT_CLINIC_OR_DEPARTMENT_OTHER)

## 2023-10-26 ENCOUNTER — Encounter (HOSPITAL_COMMUNITY)
Admission: RE | Admit: 2023-10-26 | Discharge: 2023-10-26 | Disposition: A | Discharge status: Home / Self Care | Payer: Medicare PPO | Source: Ambulatory Visit | Attending: Pulmonary Disease | Admitting: Pulmonary Disease

## 2023-10-26 VITALS — Wt 164.0 lb

## 2023-10-26 DIAGNOSIS — R0602 Shortness of breath: Secondary | ICD-10-CM | POA: Diagnosis not present

## 2023-10-26 NOTE — Progress Notes (Signed)
 Daily Session Note  Patient Details  Name: [name] MRN: [id number] Date of Birth: 1941-01-13 Referring Provider:   [name] Pulmonary Rehab Walk Test from 08/07/2023 in [facility]  Referring Provider [name]       Encounter Date: 10/26/2023  Check In:  Session Check In - 10/26/23 4098       Check-In   Supervising physician immediately available to respond to emergencies [facility] MD immediately available    Physician(s) Levin Reamer, NP    Location [facility]    Staff Present Atlas Lea, MS, ACSM-CEP, Exercise Physiologist;Randi Rochelle Chu, ACSM-CEP, Exercise Physiologist;Eula Mazzola Carmen Chol, RN, BSN    Virtual Visit No    Medication changes reported     No    Fall or balance concerns reported    No    Tobacco Cessation No Change    Warm-up and Cool-down Performed as group-led instruction    Resistance Training Performed Yes    VAD Patient? No    PAD/SET Patient? No      Pain Assessment   Currently in Pain? No/denies    Multiple Pain Sites No             Capillary Blood Glucose: No results found for this or any previous visit (from the past 24 hours).    Social History   Tobacco Use  Smoking Status Never  Smokeless Tobacco Never    Goals Met:  Proper associated with RPD/PD & O2 Sat Independence with exercise equipment Exercise tolerated well No report of concerns or symptoms today Strength training completed today  Goals Unmet:  Not Applicable  Comments: Service time is from 0808 to 0925.    Dr. Genetta Kenning is Medical Director for Pulmonary Rehab at [facility].

## 2023-10-31 ENCOUNTER — Encounter (HOSPITAL_COMMUNITY)
Admission: RE | Admit: 2023-10-31 | Discharge: 2023-10-31 | Disposition: A | Discharge status: Home / Self Care | Payer: Medicare PPO | Source: Ambulatory Visit | Attending: Pulmonary Disease | Admitting: Pulmonary Disease

## 2023-10-31 DIAGNOSIS — C4441 Basal cell carcinoma of skin of scalp and neck: Secondary | ICD-10-CM | POA: Diagnosis not present

## 2023-10-31 HISTORY — PX: MOHS SURGERY: SHX181

## 2023-11-01 NOTE — Progress Notes (Signed)
 Pulmonary Individual Treatment Plan  Patient Details  Name: [name] MRN: [id number] Date of Birth: [birth year] Referring Provider:   [name] Pulmonary Rehab Walk Test from 08/07/2023 in [facility]  Referring Provider [name]       Initial Encounter Date:  Flowsheet Row Pulmonary Rehab Walk Test from 08/07/2023 in [facility]  Date 08/07/23       Visit Diagnosis: Shortness of breath  Patient's Home Medications on Admission:   Current Outpatient Medications:    acalabrutinib  maleate (CALQUENCE ) 100 MG tablet, Take 1 tablet (100 mg) by mouth daily., Disp: 30 tablet, Rfl: 4   acetaminophen  (TYLENOL ) 325 MG tablet, Take 325 mg by mouth daily as needed for headache (pain)., Disp: , Rfl:    cholecalciferol  (VITAMIN D3) 25 MCG (1000 UNIT) tablet, Take 1,000 Units by mouth daily. , Disp: , Rfl:    clobetasol cream (TEMOVATE) 0.05 %, Apply 1 Application topically as needed. (Patient not taking: Reported on 08/07/2023), Disp: , Rfl:    COVID-19 mRNA vaccine 2023-2024 (COMIRNATY ) syringe, Inject 0.3 mLs into the muscle daily., Disp: 0.3 mL, Rfl: 0   EPINEPHrine  (EPIPEN ) 0.3 mg/0.3 mL DEVI, Inject 0.3 mLs (0.3 mg total) into the muscle once., Disp: 1 Device, Rfl: 0   influenza vaccine adjuvanted (FLUAD) 0.5 ML injection, Inject into the muscle., Disp: 0.5 mL, Rfl: 0   metoprolol  succinate (TOPROL -XL) 25 MG 24 hr tablet, TAKE 1 TABLET(25 MG) BY MOUTH DAILY, Disp: 90 tablet, Rfl: 2   olmesartan (BENICAR) 40 MG tablet, Take 40 mg by mouth daily., Disp: , Rfl:    simvastatin  (ZOCOR ) 40 MG tablet, Take 20 mg by mouth daily. , Disp: , Rfl:   Past Medical History: Past Medical History:  Diagnosis Date   Acute encephalopathy 08/17/2016   Acute renal failure superimposed on stage 3 chronic kidney disease (HCC) 10/15/2018   AKI (acute kidney injury) (HCC) 08/16/2016    Arthritis    Bacteremia due to Enterobacter species 08/19/2016   Carpal tunnel syndrome, bilateral 10/09/2017   Chest tightness 10/15/2018   CLL (chronic lymphocytic leukemia) (HCC) 12/16/2014   Dyspnea 09/20/2012   Elevated troponin 10/16/2018   Enterobacter sepsis (HCC) 08/19/2016   Fall 10/15/2018   GERD (gastroesophageal reflux disease)    Hepatitis 1966   Drug reaction after taking medication   Hyperglycemia    Hyperlipemia    Hypertension    Hypokalemia 08/16/2016   Lung nodule seen on imaging study    bilateral lungs   Nasal bone fracture 10/15/2018   Right foot pain    SAH (subarachnoid hemorrhage) (HCC) 10/15/2018   Syncope 10/15/2018   Thrombocytopenia (HCC) 10/15/2018   Urinary tract infection with hematuria     Tobacco Use: Social History   Tobacco Use  Smoking Status Never  Smokeless Tobacco Never    Labs: Review Flowsheet       Latest Ref Rng & Units 02/03/2011 08/16/2016 10/16/2018  Labs for ITP Cardiac and Pulmonary Rehab  Cholestrol 0 - 200 mg/dL - - 782   LDL (calc) 0 - 99 mg/dL - - 61   HDL-C >95 mg/dL - - 20   Trlycerides <621 mg/dL - - 308   Hemoglobin M5H 4.8 - 5.6 % - - 5.4   PH, Arterial 7.350 - 7.450 - 7.501  -  PCO2 arterial 32.0 - 48.0 mmHg - 25.8  -  Bicarbonate 20.0 - 28.0  mmol/L - 20.0  -  TCO2 0 - 100 mmol/L 23  - -  Acid-base deficit 0.0 - 2.0 mmol/L - 1.7  -  O2 Saturation % - 95.3  -    Capillary Blood Glucose: Lab Results  Component Value Date   GLUCAP 79 10/19/2018   GLUCAP 90 10/19/2018   GLUCAP 88 10/16/2018   GLUCAP 101 (H) 10/16/2018     Pulmonary Assessment Scores:  Pulmonary Assessment Scores     Row Name 08/07/23 1044         ADL UCSD   ADL Phase Entry     SOB Score total 10       CAT Score   CAT Score 4       mMRC Score   mMRC Score 0             UCSD: Self-administered rating of dyspnea associated with activities of daily living (ADLs) 6-point scale (0 = "not at all" to 5 = "maximal or unable to do  because of breathlessness")  Scoring Scores range from 0 to 120.  Minimally important difference is 5 units  CAT: CAT can identify the health impairment of COPD patients and is better correlated with disease progression.  CAT has a scoring range of zero to 40. The CAT score is classified into four groups of low (less than 10), medium (10 - 20), high (21-30) and very high (31-40) based on the impact level of disease on health status. A CAT score over 10 suggests significant symptoms.  A worsening CAT score could be explained by an exacerbation, poor medication adherence, poor inhaler technique, or progression of COPD or comorbid conditions.  CAT MCID is 2 points  mMRC: mMRC (Modified Medical Research Council) Dyspnea Scale is used to assess the degree of baseline functional disability in patients of respiratory disease due to dyspnea. No minimal important difference is established. A decrease in score of 1 point or greater is considered a positive change.   Pulmonary Function Assessment:  Pulmonary Function Assessment - 08/07/23 1044       Breath   Shortness of Breath Yes;Limiting activity             Exercise Target Goals: Exercise Program Goal: Individual exercise prescription set using results from initial 6 min walk test and THRR while considering  patient's activity barriers and safety.   Exercise Prescription Goal: Initial exercise prescription builds to 30-45 minutes a day of aerobic activity, 2-3 days per week.  Home exercise guidelines will be given to patient during program as part of exercise prescription that the participant will acknowledge.  Activity Barriers & Risk Stratification:  Activity Barriers & Cardiac Risk Stratification - 08/07/23 1044       Activity Barriers & Cardiac Risk Stratification   Activity Barriers Arthritis;Muscular Weakness;Deconditioning;Joint Problems             6 Minute Walk:  6 Minute Walk     Row Name 08/07/23 1139         6  Minute Walk   Phase Initial     Distance 1410 feet     Walk Time 6 minutes     # of Rest Breaks 0     MPH 2.67     METS 2.05     RPE 9     Perceived Dyspnea  0.5     VO2 Peak 7.18     Symptoms No     Resting HR 59 bpm  Resting BP 124/72     Resting Oxygen Saturation  100 %     Exercise Oxygen Saturation  during 6 min walk 98 %     Max Ex. HR 74 bpm     Max Ex. BP 138/72     2 Minute Post BP 122/70       Interval HR   1 Minute HR 71     2 Minute HR 72     3 Minute HR 72     4 Minute HR 72     5 Minute HR 73     6 Minute HR 74     2 Minute Post HR 52     Interval Heart Rate? Yes       Interval Oxygen   Interval Oxygen? Yes     Baseline Oxygen Saturation % 100 %     1 Minute Oxygen Saturation % 99 %     1 Minute Liters of Oxygen 0 L     2 Minute Oxygen Saturation % 99 %     2 Minute Liters of Oxygen 0 L     3 Minute Oxygen Saturation % 100 %     3 Minute Liters of Oxygen 0 L     4 Minute Oxygen Saturation % 98 %     4 Minute Liters of Oxygen 0 L     5 Minute Oxygen Saturation % 100 %     5 Minute Liters of Oxygen 0 L     6 Minute Oxygen Saturation % 100 %     6 Minute Liters of Oxygen 0 L     2 Minute Post Oxygen Saturation % 100 %     2 Minute Post Liters of Oxygen 0 L              Oxygen Initial Assessment:  Oxygen Initial Assessment - 08/07/23 1043       Home Oxygen   Home Oxygen Device None    Sleep Oxygen Prescription None    Home Exercise Oxygen Prescription None    Home Resting Oxygen Prescription None      Initial 6 min Walk   Oxygen Used None      Program Oxygen Prescription   Program Oxygen Prescription None      Intervention   Short Term Goals To learn and understand importance of monitoring SPO2 with pulse oximeter and demonstrate accurate use of the pulse oximeter.;To learn and understand importance of maintaining oxygen saturations>88%;To learn and demonstrate proper pursed lip breathing techniques or other breathing techniques. ;To  learn and demonstrate proper use of respiratory medications    Long  Term Goals Verbalizes importance of monitoring SPO2 with pulse oximeter and return demonstration;Maintenance of O2 saturations>88%;Exhibits proper breathing techniques, such as pursed lip breathing or other method taught during program session;Compliance with respiratory medication;Demonstrates proper use of MDI's             Oxygen Re-Evaluation:  Oxygen Re-Evaluation     Row Name 08/08/23 0748 09/25/23 0953 10/25/23 0910         Program Oxygen Prescription   Program Oxygen Prescription None None None       Home Oxygen   Home Oxygen Device None None None     Sleep Oxygen Prescription None None None     Home Exercise Oxygen Prescription None None None     Home Resting Oxygen Prescription None None None       Goals/Expected Outcomes  Short Term Goals To learn and understand importance of monitoring SPO2 with pulse oximeter and demonstrate accurate use of the pulse oximeter.;To learn and understand importance of maintaining oxygen saturations>88%;To learn and demonstrate proper pursed lip breathing techniques or other breathing techniques. ;To learn and demonstrate proper use of respiratory medications To learn and understand importance of monitoring SPO2 with pulse oximeter and demonstrate accurate use of the pulse oximeter.;To learn and understand importance of maintaining oxygen saturations>88%;To learn and demonstrate proper pursed lip breathing techniques or other breathing techniques. ;To learn and demonstrate proper use of respiratory medications To learn and understand importance of monitoring SPO2 with pulse oximeter and demonstrate accurate use of the pulse oximeter.;To learn and understand importance of maintaining oxygen saturations>88%;To learn and demonstrate proper pursed lip breathing techniques or other breathing techniques. ;To learn and demonstrate proper use of respiratory medications     Long  Term  Goals Verbalizes importance of monitoring SPO2 with pulse oximeter and return demonstration;Maintenance of O2 saturations>88%;Exhibits proper breathing techniques, such as pursed lip breathing or other method taught during program session;Compliance with respiratory medication;Demonstrates proper use of MDI's Verbalizes importance of monitoring SPO2 with pulse oximeter and return demonstration;Maintenance of O2 saturations>88%;Exhibits proper breathing techniques, such as pursed lip breathing or other method taught during program session;Compliance with respiratory medication;Demonstrates proper use of MDI's Verbalizes importance of monitoring SPO2 with pulse oximeter and return demonstration;Maintenance of O2 saturations>88%;Exhibits proper breathing techniques, such as pursed lip breathing or other method taught during program session;Compliance with respiratory medication;Demonstrates proper use of MDI's     Goals/Expected Outcomes Compliance and understanding of oxygen saturation monitoring and breathing techniques to decrease shortness of breath Compliance and understanding of oxygen saturation monitoring and breathing techniques to decrease shortness of breath Compliance and understanding of oxygen saturation monitoring and breathing techniques to decrease shortness of breath              Oxygen Discharge (Final Oxygen Re-Evaluation):  Oxygen Re-Evaluation - 10/25/23 0910       Program Oxygen Prescription   Program Oxygen Prescription None      Home Oxygen   Home Oxygen Device None    Sleep Oxygen Prescription None    Home Exercise Oxygen Prescription None    Home Resting Oxygen Prescription None      Goals/Expected Outcomes   Short Term Goals To learn and understand importance of monitoring SPO2 with pulse oximeter and demonstrate accurate use of the pulse oximeter.;To learn and understand importance of maintaining oxygen saturations>88%;To learn and demonstrate proper pursed lip  breathing techniques or other breathing techniques. ;To learn and demonstrate proper use of respiratory medications    Long  Term Goals Verbalizes importance of monitoring SPO2 with pulse oximeter and return demonstration;Maintenance of O2 saturations>88%;Exhibits proper breathing techniques, such as pursed lip breathing or other method taught during program session;Compliance with respiratory medication;Demonstrates proper use of MDI's    Goals/Expected Outcomes Compliance and understanding of oxygen saturation monitoring and breathing techniques to decrease shortness of breath             Initial Exercise Prescription:  Initial Exercise Prescription - 08/07/23 1100       Date of Initial Exercise RX and Referring Provider   Date 08/07/23    Referring Provider [name]    Expected Discharge Date 11/02/23      Treadmill   MPH 1.8    Grade 0    Minutes 15      Recumbant Elliptical   Level 1  Minutes 15    METs 2      Prescription Details   Frequency (times per week) 2    Duration Progress to 30 minutes of continuous aerobic without signs/symptoms of physical distress      Intensity   THRR 40-80% of Max Heartrate 55-110    Ratings of Perceived Exertion 11-13    Perceived Dyspnea 0-4      Progression   Progression Continue to progress workloads to maintain intensity without signs/symptoms of physical distress.      Resistance Training   Training Prescription Yes    Weight blue bands    Reps 10-15             Perform Capillary Blood Glucose checks as needed.  Exercise Prescription Changes:   Exercise Prescription Changes     Row Name 08/22/23 0900 09/05/23 0900 09/19/23 0900 09/28/23 0900 10/03/23 0900     Response to Exercise   Blood Pressure (Admit) 136/80 108/66 104/62 -- 136/74   Blood Pressure (Exercise) 130/66 132/60 132/66 -- 146/76   Blood Pressure (Exit) 112/66 100/64 108/58 -- 108/60   Heart Rate (Admit) 51 bpm 50 bpm 63 bpm -- 53 bpm   Heart  Rate (Exercise) 72 bpm 73 bpm 79 bpm -- 90 bpm   Heart Rate (Exit) 59 bpm 57 bpm 60 bpm -- 59 bpm   Oxygen Saturation (Admit) 100 % 98 % 98 % -- 99 %   Oxygen Saturation (Exercise) 98 % 98 % 96 % -- 97 %   Oxygen Saturation (Exit) 97 % 98 % 97 % -- 97 %   Rating of Perceived Exertion (Exercise) 13 12.5 12 -- 12   Perceived Dyspnea (Exercise) 2.3 2 2  -- 2   Duration Continue with 30 min of aerobic exercise without signs/symptoms of physical distress. Continue with 30 min of aerobic exercise without signs/symptoms of physical distress. Continue with 30 min of aerobic exercise without signs/symptoms of physical distress. -- Continue with 30 min of aerobic exercise without signs/symptoms of physical distress.   Intensity THRR unchanged THRR unchanged THRR unchanged -- THRR unchanged     Progression   Progression Continue to progress workloads to maintain intensity without signs/symptoms of physical distress. Continue to progress workloads to maintain intensity without signs/symptoms of physical distress. Continue to progress workloads to maintain intensity without signs/symptoms of physical distress. -- Continue to progress workloads to maintain intensity without signs/symptoms of physical distress.   Average METs 2.9 3.5 -- -- --     Resistance Training   Training Prescription Yes Yes Yes -- Yes   Weight blue bands blue bands blue bands -- black bands   Reps 10-15 10-15 10-15 -- 10-15   Time 10 Minutes 10 Minutes 10 Minutes -- 10 Minutes     Treadmill   MPH 1.8 -- 2 -- 2.2   Grade 0 -- 1 -- 1   Minutes 15 -- 15 -- 15   METs 2.2 -- -- -- 2.8     Recumbant Elliptical   Level 1 2 2  -- 2   RPM 50 -- -- -- --   Minutes 15 15 15  -- 15   METs 2.9 3.5 3.9 -- 4.5     Track   Laps -- 13.5 -- -- --   Minutes -- 15 -- -- --   METs -- 3 -- -- --     Home Exercise Plan   Plans to continue exercise at -- -- -- Home (comment) --  Frequency -- -- -- --  n/a --   Initial Home Exercises Provided  -- -- -- 09/28/23 --    Row Name 10/17/23 0900 10/26/23 0930           Response to Exercise   Blood Pressure (Admit) 130/60 122/64      Blood Pressure (Exercise) 132/64 --      Blood Pressure (Exit) 104/52 104/62      Heart Rate (Admit) 51 bpm 58 bpm      Heart Rate (Exercise) 79 bpm 78 bpm      Heart Rate (Exit) 61 bpm 62 bpm      Oxygen Saturation (Admit) 99 % 97 %      Oxygen Saturation (Exercise) 99 % 97 %      Oxygen Saturation (Exit) 98 % 96 %      Rating of Perceived Exertion (Exercise) 12 12      Perceived Dyspnea (Exercise) 2 2      Duration Continue with 30 min of aerobic exercise without signs/symptoms of physical distress. Continue with 30 min of aerobic exercise without signs/symptoms of physical distress.      Intensity THRR unchanged THRR unchanged        Progression   Progression Continue to progress workloads to maintain intensity without signs/symptoms of physical distress. Continue to progress workloads to maintain intensity without signs/symptoms of physical distress.        Resistance Training   Training Prescription Yes Yes      Weight black bands black bands      Reps 10-15 10-15      Time 10 Minutes 10 Minutes        Treadmill   MPH 2.3 2.3      Grade 1 1.5      Minutes 15 15      METs 2.9 3        Recumbant Elliptical   Level 3 3      RPM 63 --      Watts 84 --      Minutes 15 15      METs 1.3 4.4               Exercise Comments:   Exercise Comments     Row Name 08/17/23 0948 09/28/23 0944         Exercise Comments Pt completed first day of group exercise. He exercised on the recumbent elliptical for 15 min on level 1, METs 2.4. He then walked on the treadmill for 15 min, 1.8 mph, 0 incline, METs 2.3. Tolerated well although admitted to fatigue and that his workout was harder than he walks at home. Performed warm up and cool down without limitations. Discussed METs with good reception. Completed home exercise plan. Adrianne is currently  exercising at home. He walks 5-7 days/wk for about 50 min/day. Shyam mentioned he walks 1.8 miles but is not pushing himself. I encouraged Galan to increase his speed and complete the 1.8 miles in 40-45 min. Graham agreed with my recommendations. He seems motivated to exercise and improve his functional capacity. I am confident in Holt exercising at home.               Exercise Goals and Review:   Exercise Goals     Row Name 08/07/23 1151             Exercise Goals   Increase Physical Activity Yes       Intervention Provide advice, education, support and  counseling about physical activity/exercise needs.;Develop an individualized exercise prescription for aerobic and resistive training based on initial evaluation findings, risk stratification, comorbidities and participant's personal goals.       Expected Outcomes Short Term: Attend rehab on a regular basis to increase amount of physical activity.;Long Term: Exercising regularly at least 3-5 days a week.;Long Term: Add in home exercise to make exercise part of routine and to increase amount of physical activity.       Increase Strength and Stamina Yes       Intervention Provide advice, education, support and counseling about physical activity/exercise needs.;Develop an individualized exercise prescription for aerobic and resistive training based on initial evaluation findings, risk stratification, comorbidities and participant's personal goals.       Expected Outcomes Short Term: Increase workloads from initial exercise prescription for resistance, speed, and METs.;Short Term: Perform resistance training exercises routinely during rehab and add in resistance training at home;Long Term: Improve cardiorespiratory fitness, muscular endurance and strength as measured by increased METs and functional capacity ( )       Able to understand and use rate of perceived exertion (RPE) scale Yes       Intervention Provide education and explanation on  how to use RPE scale       Expected Outcomes Short Term: Able to use RPE daily in rehab to express subjective intensity level;Long Term:  Able to use RPE to guide intensity level when exercising independently       Able to understand and use Dyspnea scale Yes       Intervention Provide education and explanation on how to use Dyspnea scale       Expected Outcomes Short Term: Able to use Dyspnea scale daily in rehab to express subjective sense of shortness of breath during exertion;Long Term: Able to use Dyspnea scale to guide intensity level when exercising independently       Knowledge and understanding of Target Heart Rate Range (THRR) Yes       Intervention Provide education and explanation of THRR including how the numbers were predicted and where they are located for reference       Expected Outcomes Short Term: Able to state/look up THRR;Long Term: Able to use THRR to govern intensity when exercising independently;Short Term: Able to use daily as guideline for intensity in rehab       Understanding of Exercise Prescription Yes       Intervention Provide education, explanation, and written materials on patient's individual exercise prescription       Expected Outcomes Short Term: Able to explain program exercise prescription;Long Term: Able to explain home exercise prescription to exercise independently                Exercise Goals Re-Evaluation :  Exercise Goals Re-Evaluation     Row Name 08/08/23 0747 08/30/23 0940 09/25/23 0951 10/25/23 0906       Exercise Goal Re-Evaluation   Exercise Goals Review Increase Physical Activity;Able to understand and use Dyspnea scale;Understanding of Exercise Prescription;Increase Strength and Stamina;Knowledge and understanding of Target Heart Rate Range (THRR);Able to understand and use rate of perceived exertion (RPE) scale Increase Physical Activity;Able to understand and use Dyspnea scale;Understanding of Exercise Prescription;Increase Strength  and Stamina;Knowledge and understanding of Target Heart Rate Range (THRR);Able to understand and use rate of perceived exertion (RPE) scale Increase Physical Activity;Able to understand and use Dyspnea scale;Understanding of Exercise Prescription;Increase Strength and Stamina;Knowledge and understanding of Target Heart Rate Range (THRR);Able to understand and use rate  of perceived exertion (RPE) scale Increase Physical Activity;Able to understand and use Dyspnea scale;Understanding of Exercise Prescription;Increase Strength and Stamina;Knowledge and understanding of Target Heart Rate Range (THRR);Able to understand and use rate of perceived exertion (RPE) scale    Comments Wilsie is scheduled to begin exercise on 2/27. Will monitor and progress as able. Azaria has completed 4 exercise sessions. He exercises for 15 min on the recumbent elliptical and treadmill. Sylvester Evert averages 3.7 METs at level 1 on the recumbent elliptical and 2.3 METs at 1.8 mph on the treadmill. He performs the warmup and cooldown standing without limitations. It is too soon to notate any discernable progressions. Will continue to monitor and progress as able. Naquan has completed 11 exercise sessions. He exercises for 15 min on the recumbent elliptical and treadmill. Sylvester Evert averages 4.0 METs at level 2 on the recumbent elliptical and 2.6 METs at 2 mph and 1% incline on the treadmill. He performs the warmup and cooldown standing without limitations. Khayman has increased his level for the recumbent elliptical and speed and incline on the treadmill. METs have increased for both exercise modes. Kaian tolerates progressions well. Will continue to monitor and progress as able. Luisdavid has completed 20 exercise sessions. He exercises for 15 min on the recumbent elliptical and treadmill. Sylvester Evert averages 4.3 METs at level 3 on the recumbent elliptical and 3.2 METs at 2.3 mph and 1.5% incline on the treadmill. He performs the warmup and cooldown standing without  limitations. Daved continues to increase his level for the recumbent elliptical and speed and incline on the treadmill. He is hesitant to increase but will eventually increase. We have discussed home exercise as Sion is currently exercising at home. I am confident in him carrying out an exercise at home. Will continue to monitor and progress as able.    Expected Outcomes Though exercise at rehab and home, the patient will decrease shortness of breath with daily activities and feel confident in carrying out an exercise regimen at home. Though exercise at rehab and home, the patient will decrease shortness of breath with daily activities and feel confident in carrying out an exercise regimen at home. Though exercise at rehab and home, the patient will decrease shortness of breath with daily activities and feel confident in carrying out an exercise regimen at home. Though exercise at rehab and home, the patient will decrease shortness of breath with daily activities and feel confident in carrying out an exercise regimen at home.             Discharge Exercise Prescription (Final Exercise Prescription Changes):  Exercise Prescription Changes - 10/26/23 0930       Response to Exercise   Blood Pressure (Admit) 122/64    Blood Pressure (Exit) 104/62    Heart Rate (Admit) 58 bpm    Heart Rate (Exercise) 78 bpm    Heart Rate (Exit) 62 bpm    Oxygen Saturation (Admit) 97 %    Oxygen Saturation (Exercise) 97 %    Oxygen Saturation (Exit) 96 %    Rating of Perceived Exertion (Exercise) 12    Perceived Dyspnea (Exercise) 2    Duration Continue with 30 min of aerobic exercise without signs/symptoms of physical distress.    Intensity THRR unchanged      Progression   Progression Continue to progress workloads to maintain intensity without signs/symptoms of physical distress.      Resistance Training   Training Prescription Yes    Weight black bands    Reps  10-15    Time 10 Minutes       Treadmill   MPH 2.3    Grade 1.5    Minutes 15    METs 3      Recumbant Elliptical   Level 3    Minutes 15    METs 4.4             Nutrition:  Target Goals: Understanding of nutrition guidelines, daily intake of sodium 1500mg , cholesterol 200mg , calories 30% from fat and 7% or less from saturated fats, daily to have 5 or more servings of fruits and vegetables.  Biometrics:    Nutrition Therapy Plan and Nutrition Goals:  Nutrition Therapy & Goals - 10/31/23 1225       Nutrition Therapy   Diet general healthy diet      Personal Nutrition Goals   Nutrition Goal Patient to improve diet quality by using the plate method as a guide for meal planning to include lean protein/plant protein, fruits, vegetables, whole grains, nonfat dairy as part of a well-balanced diet.   goal in action.   Comments Goals in action. Azavier has medical history of CAD, HTN, CLL, CKD3, shortness of breath. Per cardiology notes, he continues mindfulness of sodum and saturated fat intake. He continues to monitor blood pressure at home. LDL remains at goal.He is AOZH0.8# since starting with our program. Tige will benefit from participation in pulmonary rehab for nutrition and exercise support.      Intervention Plan   Intervention Prescribe, educate and counsel regarding individualized specific dietary modifications aiming towards targeted core components such as weight, hypertension, lipid management, diabetes, heart failure and other comorbidities.;Nutrition handout(s) given to patient.    Expected Outcomes Short Term Goal: Understand basic principles of dietary content, such as calories, fat, sodium, cholesterol and nutrients.;Long Term Goal: Adherence to prescribed nutrition plan.             Nutrition Assessments:  MEDIFICTS Score Key: >=70 Need to make dietary changes  40-70 Heart Healthy Diet <= 40 Therapeutic Level Cholesterol Diet   Picture Your Plate Scores: <65 Unhealthy dietary  pattern with much room for improvement. 41-50 Dietary pattern unlikely to meet recommendations for good health and room for improvement. 51-60 More healthful dietary pattern, with some room for improvement.  >60 Healthy dietary pattern, although there may be some specific behaviors that could be improved.    Nutrition Goals Re-Evaluation:  Nutrition Goals Re-Evaluation     Row Name 08/29/23 1039 09/28/23 0939 10/31/23 1225         Goals   Current Weight -- 164 lb 3.9 oz (74.5 kg) 165 lb 9.1 oz (75.1 kg)     Comment lipids WNL, LDL 67 no new labs; most recent labs  lipids WNL, LDL 67 no new labs; most recent labs lipids WNL, LDL 67     Expected Outcome Jaison has medical history of CAD, HTN, CLL, CKD3, shortness of breath. Per cardiology notes, he continues mindfulness of sodum and saturated fat intake. He continues to monitor blood pressure at home. LDL remains at goal. Antionne will benefit from participation in pulmonary rehab for nutrition and exercise support. Goals in action. Abdiqani has medical history of CAD, HTN, CLL, CKD3, shortness of breath. Per cardiology notes, he continues mindfulness of sodum and saturated fat intake. He continues to monitor blood pressure at home. LDL remains at goal.He is HQIO9.6# since starting with our program. Aubree will benefit from participation in pulmonary rehab for nutrition and exercise support.  Goals in action. Aikam has medical history of CAD, HTN, CLL, CKD3, shortness of breath. Per cardiology notes, he continues mindfulness of sodum and saturated fat intake. He continues to monitor blood pressure at home. LDL remains at goal.He is NUUV2.5# since starting with our program. Tryson will benefit from participation in pulmonary rehab for nutrition and exercise support.              Nutrition Goals Discharge (Final Nutrition Goals Re-Evaluation):  Nutrition Goals Re-Evaluation - 10/31/23 1225       Goals   Current Weight 165 lb 9.1 oz (75.1 kg)     Comment no new labs; most recent labs lipids WNL, LDL 67    Expected Outcome Goals in action. Walker has medical history of CAD, HTN, CLL, CKD3, shortness of breath. Per cardiology notes, he continues mindfulness of sodum and saturated fat intake. He continues to monitor blood pressure at home. LDL remains at goal.He is DGUY4.0# since starting with our program. Corney will benefit from participation in pulmonary rehab for nutrition and exercise support.             Psychosocial: Target Goals: Acknowledge presence or absence of significant depression and/or stress, maximize coping skills, provide positive support system. Participant is able to verbalize types and ability to use techniques and skills needed for reducing stress and depression.  Initial Review & Psychosocial Screening:  Initial Psych Review & Screening - 08/07/23 1038       Initial Review   Current issues with None Identified      Family Dynamics   Good Support System? Yes    Comments spouse, daughter      Barriers   Psychosocial barriers to participate in program There are no identifiable barriers or psychosocial needs.      Screening Interventions   Interventions Encouraged to exercise             Quality of Life Scores:  Scores of 19 and below usually indicate a poorer quality of life in these areas.  A difference of  2-3 points is a clinically meaningful difference.  A difference of 2-3 points in the total score of the Quality of Life Index has been associated with significant improvement in overall quality of life, self-image, physical symptoms, and general health in studies assessing change in quality of life.  PHQ-9: Review Flowsheet       08/07/2023  Depression screen PHQ 2/9  Decreased Interest 0  Down, Depressed, Hopeless 0  PHQ - 2 Score 0  Altered sleeping 0  Tired, decreased energy 0  Change in appetite 0  Feeling bad or failure about yourself  0  Trouble concentrating 0  Moving slowly or  fidgety/restless 0  Suicidal thoughts 0  PHQ-9 Score 0  Difficult doing work/chores Not difficult at all   Interpretation of Total Score  Total Score Depression Severity:  1-4 = Minimal depression, 5-9 = Mild depression, 10-14 = Moderate depression, 15-19 = Moderately severe depression, 20-27 = Severe depression   Psychosocial Evaluation and Intervention:  Psychosocial Evaluation - 08/07/23 1039       Psychosocial Evaluation & Interventions   Interventions Encouraged to exercise with the program and follow exercise prescription    Comments Jaja denies any psychosocial barriers at this time.    Expected Outcomes For Merland to participate in rehab free of psychosocial concerns.    Continue Psychosocial Services  No Follow up required  Psychosocial Re-Evaluation:  Psychosocial Re-Evaluation     Row Name 08/08/23 [phone] 08/28/23 1553 09/27/23 1053 10/23/23 0955       Psychosocial Re-Evaluation   Current issues with None Identified None Identified None Identified None Identified    Comments No changes since orientation. Jermain is scheduled to start the program next week Timofei continues to deny any psychosocial barriers or concerns a thtis time. Psychosocial monthly re-evaluation is as follows: Dacen continues to deny any psychosocial barriers or concerns a thtis time. Hurschel continues to deny any psychosocial barriers or concerns a thtis time.    Expected Outcomes For Skipper to participate in rehab free of psychosocial barriers  or concerns For Mayron to participate in rehab free of psychosocial barriers  or concerns For Aristede to participate in rehab free of psychosocial barriers  or concerns For Patti to participate in rehab free of psychosocial barriers  or concerns    Interventions Encouraged to attend Pulmonary Rehabilitation for the exercise Encouraged to attend Pulmonary Rehabilitation for the exercise Encouraged to attend Pulmonary Rehabilitation for the exercise Encouraged  to attend Pulmonary Rehabilitation for the exercise    Continue Psychosocial Services  No Follow up required No Follow up required No Follow up required No Follow up required             Psychosocial Discharge (Final Psychosocial Re-Evaluation):  Psychosocial Re-Evaluation - 10/23/23 0955       Psychosocial Re-Evaluation   Current issues with None Identified    Comments Hadrian continues to deny any psychosocial barriers or concerns a thtis time.    Expected Outcomes For Sheena to participate in rehab free of psychosocial barriers  or concerns    Interventions Encouraged to attend Pulmonary Rehabilitation for the exercise    Continue Psychosocial Services  No Follow up required             Education: Education Goals: Education classes will be provided on a weekly basis, covering required topics. Participant will state understanding/return demonstration of topics presented.  Learning Barriers/Preferences:  Learning Barriers/Preferences - 08/07/23 1040       Learning Barriers/Preferences   Learning Barriers Sight    Learning Preferences None             Education Topics: Know Your Numbers Group instruction that is supported by a PowerPoint presentation. Instructor discusses importance of knowing and understanding resting, exercise, and post-exercise oxygen saturation, heart rate, and blood pressure. Oxygen saturation, heart rate, blood pressure, rating of perceived exertion, and dyspnea are reviewed along with a normal range for these values.  Flowsheet Row PULMONARY REHAB OTHER RESPIRATORY from 09/21/2023 in [facility] for Heart, Vascular, & Lung Health  Date 09/21/23  Educator EP  Instruction Review Code 1- Verbalizes Understanding       Exercise for the Pulmonary Patient Group instruction that is supported by a PowerPoint presentation. Instructor discusses benefits of exercise, core components of exercise, frequency, duration, and intensity  of an exercise routine, importance of utilizing pulse oximetry during exercise, safety while exercising, and options of places to exercise outside of rehab.  Flowsheet Row PULMONARY REHAB OTHER RESPIRATORY from 09/14/2023 in [facility] for Heart, Vascular, & Lung Health  Date 09/14/23  Educator EP  Instruction Review Code 1- Verbalizes Understanding       MET Level  Group instruction provided by PowerPoint, verbal discussion, and written material to support subject matter. Instructor reviews what METs are and how to increase METs.  Pulmonary Medications Verbally interactive group education provided by instructor with focus on inhaled medications and proper administration. Flowsheet Row PULMONARY REHAB OTHER RESPIRATORY from 09/07/2023 in [facility] for Heart, Vascular, & Lung Health  Date 09/07/23  Educator RT  Instruction Review Code 1- Verbalizes Understanding       Anatomy and Physiology of the Respiratory System Group instruction provided by PowerPoint, verbal discussion, and written material to support subject matter. Instructor reviews respiratory cycle and anatomical components of the respiratory system and their functions. Instructor also reviews differences in obstructive and restrictive respiratory diseases with examples of each.  Flowsheet Row PULMONARY REHAB OTHER RESPIRATORY from 08/24/2023 in [facility] for Heart, Vascular, & Lung Health  Date 08/24/23  Educator RT  Instruction Review Code 1- Verbalizes Understanding       Oxygen Safety Group instruction provided by PowerPoint, verbal discussion, and written material to support subject matter. There is an overview of "What is Oxygen" and "Why do we need it".  Instructor also reviews how to create a safe environment for oxygen use, the importance of using oxygen as prescribed, and the risks of noncompliance. There is a brief discussion on traveling  with oxygen and resources the patient may utilize. Flowsheet Row PULMONARY REHAB OTHER RESPIRATORY from 09/28/2023 in [facility] for Heart, Vascular, & Lung Health  Date 09/28/23  Educator EP  Instruction Review Code 1- Verbalizes Understanding       Oxygen Use Group instruction provided by PowerPoint, verbal discussion, and written material to discuss how supplemental oxygen is prescribed and different types of oxygen supply systems. Resources for more information are provided.    Breathing Techniques Group instruction that is supported by demonstration and informational handouts. Instructor discusses the benefits of pursed lip and diaphragmatic breathing and detailed demonstration on how to perform both.  Flowsheet Row PULMONARY REHAB OTHER RESPIRATORY from 10/12/2023 in [facility] for Heart, Vascular, & Lung Health  Date 10/12/23  Educator RN  Instruction Review Code 1- Verbalizes Understanding        Risk Factor Reduction Group instruction that is supported by a PowerPoint presentation. Instructor discusses the definition of a risk factor, different risk factors for pulmonary disease, and how the heart and lungs work together.   Pulmonary Diseases Group instruction provided by PowerPoint, verbal discussion, and written material to support subject matter. Instructor gives an overview of the different type of pulmonary diseases. There is also a discussion on risk factors and symptoms as well as ways to manage the diseases. Flowsheet Row PULMONARY REHAB OTHER RESPIRATORY from 08/17/2023 in [facility] for Heart, Vascular, & Lung Health  Date 08/17/23  Educator RT  Instruction Review Code 1- Verbalizes Understanding       Stress and Energy Conservation Group instruction provided by PowerPoint, verbal discussion, and written material to support subject matter. Instructor gives an overview of stress and the  impact it can have on the body. Instructor also reviews ways to reduce stress. There is also a discussion on energy conservation and ways to conserve energy throughout the day. Flowsheet Row PULMONARY REHAB OTHER RESPIRATORY from 10/19/2023 in [facility] for Heart, Vascular, & Lung Health  Date 10/19/23  Educator RN  Instruction Review Code 1- Verbalizes Understanding       Warning Signs and Symptoms Group instruction provided by PowerPoint, verbal discussion, and written material to support subject matter. Instructor reviews warning signs and  symptoms of stroke, heart attack, cold and flu. Instructor also reviews ways to prevent the spread of infection. Flowsheet Row PULMONARY REHAB OTHER RESPIRATORY from 10/26/2023 in [facility] for Heart, Vascular, & Lung Health  Date 10/26/23  Educator RN  Instruction Review Code 1- Verbalizes Understanding       Other Education Group or individual verbal, written, or video instructions that support the educational goals of the pulmonary rehab program.    Knowledge Questionnaire Score:  Knowledge Questionnaire Score - 08/07/23 1146       Knowledge Questionnaire Score   Pre Score 10/18             Core Components/Risk Factors/Patient Goals at Admission:  Personal Goals and Risk Factors at Admission - 08/07/23 1043       Core Components/Risk Factors/Patient Goals on Admission   Improve shortness of breath with ADL's Yes    Intervention Provide education, individualized exercise plan and daily activity instruction to help decrease symptoms of SOB with activities of daily living.    Expected Outcomes Short Term: Improve cardiorespiratory fitness to achieve a reduction of symptoms when performing ADLs;Long Term: Be able to perform more ADLs without symptoms or delay the onset of symptoms             Core Components/Risk Factors/Patient Goals Review:   Goals and Risk Factor Review      Row Name 08/08/23 0920 08/28/23 1554 09/27/23 1053 10/23/23 0955       Core Components/Risk Factors/Patient Goals Review   Personal Goals Review Improve shortness of breath with ADL's;Develop more efficient breathing techniques such as purse lipped breathing and diaphragmatic breathing and practicing self-pacing with activity. Improve shortness of breath with ADL's;Develop more efficient breathing techniques such as purse lipped breathing and diaphragmatic breathing and practicing self-pacing with activity. Improve shortness of breath with ADL's;Develop more efficient breathing techniques such as purse lipped breathing and diaphragmatic breathing and practicing self-pacing with activity. Improve shortness of breath with ADL's    Review Unable to assess, Vanessa has not started the program yet. He is scheduled to start the program next week. Goal progressing for improving shortness of breath. Gram is currently able to maintain sats >88% on RA while exercising. He is currently exercising on the treadmill and the recumbant elliptical. Goal progressing for developing more efficient breathing techniques such as purse lipped breathing and diaphragmatic breathing; and practicing self-pacing with activity. We will continue to monitor Tiandre's progress throughout the program. Monthly review of patient's Core Components/Risk Factors/Patient Goals are as follows: Goal progressing for improving shortness of breath. Buchanan is currently able to maintain sats >88% on RA while exercising. He is currently exercising on the treadmill and the recumbent elliptical. He has increased his METs and workload with dyspnea WNL. Goal met for developing more efficient breathing techniques such as purse lipped breathing and diaphragmatic breathing; and practicing self-pacing with activity. Akshit can initiate PLB on his own and practices diaphragmatic breathing at home. Amari can self-pace when dyspneic. We will continue to monitor Demetres's  progress throughout the program. Monthly review of patient's Core Components/Risk Factors/Patient Goals are as follows: Goal progressing for improving shortness of breath. Jakhari is currently able to maintain sats >88% on RA while exercising. He is currently exercising on the treadmill and the recumbent elliptical. He has increased his METs and workload with dyspnea WNL. We will continue to monitor Bradford's progress throughout the program.    Expected Outcomes For Yuuki to improve his shortness of  breath with ADLs and develop more efficient breathing techniques such as purse lipped breathing and diaphragmatic breathing; and practicing self-pacing with activity For Boyd to improve his shortness of breath with ADLs and develop more efficient breathing techniques such as purse lipped breathing and diaphragmatic breathing; and practicing self-pacing with activity For Jaiveer to improve his shortness of breath with ADLs For Johney to improve his shortness of breath with ADLs             Core Components/Risk Factors/Patient Goals at Discharge (Final Review):   Goals and Risk Factor Review - 10/23/23 0955       Core Components/Risk Factors/Patient Goals Review   Personal Goals Review Improve shortness of breath with ADL's    Review Monthly review of patient's Core Components/Risk Factors/Patient Goals are as follows: Goal progressing for improving shortness of breath. Ku is currently able to maintain sats >88% on RA while exercising. He is currently exercising on the treadmill and the recumbent elliptical. He has increased his METs and workload with dyspnea WNL. We will continue to monitor Norton's progress throughout the program.    Expected Outcomes For Tyral to improve his shortness of breath with ADLs             ITP Comments: Pt is making expected progress toward Pulmonary Rehab goals after completing 21 session(s). Recommend continued exercise, life style modification, education, and  utilization of breathing techniques to increase stamina and strength, while also decreasing shortness of breath with exertion.  Dr. Genetta Kenning is Medical Director for Pulmonary Rehab at [facility].

## 2023-11-02 ENCOUNTER — Encounter (HOSPITAL_COMMUNITY): Payer: Medicare PPO

## 2023-11-06 ENCOUNTER — Other Ambulatory Visit: Payer: Self-pay | Admitting: Pharmacy Technician

## 2023-11-06 ENCOUNTER — Other Ambulatory Visit: Payer: Self-pay

## 2023-11-06 NOTE — Progress Notes (Signed)
 Specialty Pharmacy Refill Coordination Note  [name] is a 83 y.o. male contacted today regarding refills of specialty medication(s) Acalabrutinib  Maleate (Calquence )   Patient requested Delivery   Delivery date: 11/15/23   Verified address: [address]  [place]   Medication will be filled on 11/14/23.

## 2023-11-07 ENCOUNTER — Encounter (HOSPITAL_COMMUNITY)
Admission: RE | Admit: 2023-11-07 | Discharge: 2023-11-07 | Disposition: A | Discharge status: Home / Self Care | Source: Ambulatory Visit | Attending: Pulmonary Disease | Admitting: Pulmonary Disease

## 2023-11-07 DIAGNOSIS — R0602 Shortness of breath: Secondary | ICD-10-CM | POA: Diagnosis not present

## 2023-11-07 NOTE — Progress Notes (Signed)
 Daily Session Note  Patient Details  Name: [name] MRN: [id number] Date of Birth: 27-May-1941 Referring Provider:   [name] Pulmonary Rehab Walk Test from 08/07/2023 in [facility] for Heart, Vascular, & Lung Health  Referring Provider [name]       Encounter Date: 11/07/2023  Check In:  Session Check In - 11/07/23 2725       Check-In   Supervising physician immediately available to respond to emergencies [facility] MD immediately available    Physician(s) Palmer Bobo, NP    Location [facility]    Staff Present Atlas Lea, MS, ACSM-CEP, Exercise Physiologist;Randi Rochelle Chu, ACSM-CEP, Exercise Physiologist;Casey Carmen Chol, RN, BSN    Virtual Visit No    Medication changes reported     No    Fall or balance concerns reported    No    Tobacco Cessation No Change    Warm-up and Cool-down Performed as group-led instruction    Resistance Training Performed Yes    VAD Patient? No    PAD/SET Patient? No      Pain Assessment   Currently in Pain? No/denies    Multiple Pain Sites No             Capillary Blood Glucose: No results found for this or any previous visit (from the past 24 hours).    Social History   Tobacco Use  Smoking Status Never  Smokeless Tobacco Never    Goals Met:  Proper associated with RPD/PD & O2 Sat Independence with exercise equipment Exercise tolerated well No report of concerns or symptoms today Strength training completed today  Goals Unmet:  Not Applicable  Comments: Service time is from 0810 to 0921.    Dr. Genetta Kenning is Medical Director for Pulmonary Rehab at [facility].

## 2023-11-09 ENCOUNTER — Telehealth (HOSPITAL_COMMUNITY): Payer: Self-pay

## 2023-11-09 ENCOUNTER — Encounter (HOSPITAL_COMMUNITY): Admission: RE | Admit: 2023-11-09 | Source: Ambulatory Visit

## 2023-11-09 NOTE — Telephone Encounter (Signed)
 Spoke to pt after missing Pulm Rehab today. Pt stated he had another MD appointment.

## 2023-11-10 ENCOUNTER — Ambulatory Visit (HOSPITAL_BASED_OUTPATIENT_CLINIC_OR_DEPARTMENT_OTHER): Admitting: Primary Care

## 2023-11-10 ENCOUNTER — Encounter (HOSPITAL_BASED_OUTPATIENT_CLINIC_OR_DEPARTMENT_OTHER): Payer: Self-pay | Admitting: Primary Care

## 2023-11-10 ENCOUNTER — Ambulatory Visit (HOSPITAL_BASED_OUTPATIENT_CLINIC_OR_DEPARTMENT_OTHER): Admitting: Pulmonary Disease

## 2023-11-10 VITALS — BP 112/68 | HR 54 | Ht 64.0 in | Wt 163.0 lb

## 2023-11-10 DIAGNOSIS — R0602 Shortness of breath: Secondary | ICD-10-CM | POA: Diagnosis not present

## 2023-11-10 DIAGNOSIS — J4520 Mild intermittent asthma, uncomplicated: Secondary | ICD-10-CM

## 2023-11-10 LAB — PULMONARY FUNCTION TEST
DL/VA % pred: 130 %
DL/VA: 5.19 ml/min/mmHg/L
DLCO cor % pred: 129 %
DLCO cor: 25.75 ml/min/mmHg
DLCO unc % pred: 129 %
DLCO unc: 25.75 ml/min/mmHg
FEF 25-75 Post: 1.72 L/s
FEF 25-75 Pre: 1.42 L/s
FEF2575-%Change-Post: 21 %
FEF2575-%Pred-Post: 128 %
FEF2575-%Pred-Pre: 105 %
FEV1-%Change-Post: 6 %
FEV1-%Pred-Post: 92 %
FEV1-%Pred-Pre: 87 %
FEV1-Post: 1.91 L
FEV1-Pre: 1.8 L
FEV1FVC-%Change-Post: 0 %
FEV1FVC-%Pred-Pre: 105 %
FEV6-%Change-Post: 5 %
FEV6-%Pred-Post: 92 %
FEV6-%Pred-Pre: 87 %
FEV6-Post: 2.51 L
FEV6-Pre: 2.38 L
FEV6FVC-%Pred-Post: 109 %
FEV6FVC-%Pred-Pre: 109 %
FVC-%Change-Post: 5 %
FVC-%Pred-Post: 84 %
FVC-%Pred-Pre: 80 %
FVC-Post: 2.51 L
FVC-Pre: 2.38 L
Post FEV1/FVC ratio: 76 %
Post FEV6/FVC ratio: 100 %
Pre FEV1/FVC ratio: 75 %
Pre FEV6/FVC Ratio: 100 %
RV % pred: 138 %
RV: 3.28 L
TLC % pred: 100 %
TLC: 5.91 L

## 2023-11-10 NOTE — Progress Notes (Signed)
 @Patient  ID: [name], male    DOB: [birth year], 83 y.o.   MRN: [id number]  Chief Complaint  Patient presents with   Follow-up    Shortness of breath    Referring provider: Benedetta Bradley, *  HPI: 83 year old male, never smoked. PMH significant for CAD, afib, HTN, GERD, SAH, carpal tunnel, BPPV, BPH, thrombocytopenia, CLL, hyperlipidemia, prediabetes, vit D deficiency.   11/10/2023 Discussed the use of AI scribe software for clinical note transcription with the patient, who gave verbal consent to proceed.  History of Present Illness   [name] is an 83 year old male who presents with shortness of breath. He was referred by Dr. [name] for evaluation of shortness of breath.  He has been experiencing shortness of breath for the last couple of years, particularly noticeable during his daily 50-minute walks in the neighborhood. He breathes through his mouth to catch his breath but never has to stop during these walks. He recalls being able to walk much farther at the end of these walks a couple of years ago, but he no longer feels that way.  He participates in pulmonary rehabilitation, which includes 15 minutes each on the elliptical bike and treadmill without difficulty, although he feels a bit tired afterward. He manages the exercises without difficulty but sometimes feels as though he does not get as much air through his nose as he used to.  No wheezing, chest tightness, chronic nasal congestion, sinus congestion, cough, or postnasal drip. His breathing does not interfere with daily life, including activities such as going up and down stairs, although he feels he does not get as much air through his nose as he used to.  He has a history of asthma as a teenager but has not experienced symptoms since then. He is not currently on any inhalers.      Pulmonary function testing 11/10/2023 FVC 2.51 (84%), FEV1 92%, ratio 76, TLC 100%, DLCOunc 19.89 (129%)  Allergies   Allergen Reactions   Ace Inhibitors     Other reaction(s): cough    Immunization History  Administered Date(s) Administered   Fluad Trivalent(High Dose 65+) 03/21/2023   Fluzone Influenza virus vaccine,trivalent (IIV3), split virus 04/12/2013, 03/17/2015, 04/21/2016, 02/24/2017   Hepatitis A, Adult 08/16/2018   Influenza Split 03/14/2019, 03/23/2020   Influenza, High Dose Seasonal PF 03/07/2018, 03/24/2020   Influenza-Unspecified 03/22/2022   PFIZER(Purple Top)SARS-COV-2 Vaccination 07/07/2019, 07/27/2019, 08/16/2019, 09/06/2019, 02/03/2020, 02/18/2020   Pfizer Covid-19 Vaccine Bivalent Booster 32yrs & up 10/20/2021   Pfizer(Comirnaty )Fall Seasonal Vaccine 12 years and older 09/29/2022, 03/06/2023, 10/12/2023   Pneumococcal Conjugate-13 11/15/2013   Pneumococcal Polysaccharide-23 03/28/2010   Respiratory Syncytial Virus Vaccine ,Recomb Aduvanted(Arexvy ) 03/09/2022   Tdap 10/15/2018   Zoster Recombinant(Shingrix) 03/21/2018, 07/20/2018   Zoster, Live 03/16/2014, 07/20/2018    Past Medical History:  Diagnosis Date   Acute encephalopathy 08/17/2016   Acute renal failure superimposed on stage 3 chronic kidney disease (HCC) 10/15/2018   AKI (acute kidney injury) (HCC) 08/16/2016   Arthritis    Bacteremia due to Enterobacter species 08/19/2016   Carpal tunnel syndrome, bilateral 10/09/2017   Chest tightness 10/15/2018   CLL (chronic lymphocytic leukemia) (HCC) 12/16/2014   Dyspnea 09/20/2012   Elevated troponin 10/16/2018   Enterobacter sepsis (HCC) 08/19/2016   Fall 10/15/2018   GERD (gastroesophageal reflux disease)    Hepatitis 1966   Drug reaction after taking medication   Hyperglycemia    Hyperlipemia    Hypertension    Hypokalemia 08/16/2016  Lung nodule seen on imaging study    bilateral lungs   Nasal bone fracture 10/15/2018   Right foot pain    SAH (subarachnoid hemorrhage) (HCC) 10/15/2018   Syncope 10/15/2018   Thrombocytopenia (HCC) 10/15/2018   Urinary tract infection with  hematuria     Tobacco History: Social History   Tobacco Use  Smoking Status Never  Smokeless Tobacco Never   Counseling given: Not Answered   Outpatient Medications Prior to Visit  Medication Sig Dispense Refill   acalabrutinib  maleate (CALQUENCE ) 100 MG tablet Take 1 tablet (100 mg) by mouth daily. 30 tablet 4   acetaminophen  (TYLENOL ) 325 MG tablet Take 325 mg by mouth daily as needed for headache (pain).     cholecalciferol  (VITAMIN D3) 25 MCG (1000 UNIT) tablet Take 1,000 Units by mouth daily.      clobetasol cream (TEMOVATE) 0.05 % Apply 1 Application topically as needed.     COVID-19 mRNA vaccine 2023-2024 (COMIRNATY ) syringe Inject 0.3 mLs into the muscle daily. 0.3 mL 0   EPINEPHrine  (EPIPEN ) 0.3 mg/0.3 mL DEVI Inject 0.3 mLs (0.3 mg total) into the muscle once. 1 Device 0   influenza vaccine adjuvanted (FLUAD) 0.5 ML injection Inject into the muscle. 0.5 mL 0   metoprolol  succinate (TOPROL -XL) 25 MG 24 hr tablet TAKE 1 TABLET(25 MG) BY MOUTH DAILY 90 tablet 2   olmesartan (BENICAR) 40 MG tablet Take 40 mg by mouth daily.     simvastatin  (ZOCOR ) 40 MG tablet Take 20 mg by mouth daily.      No facility-administered medications prior to visit.   Review of Systems  Review of Systems  Constitutional: Negative.   HENT: Negative.    Respiratory: Negative.  Negative for cough, chest tightness, shortness of breath and wheezing.   Cardiovascular: Negative.    Physical Exam  BP 112/68   Pulse (!) 54   Ht 5\' 4"  (1.626 m)   Wt 163 lb (73.9 kg)   SpO2 98%   BMI 27.98 kg/m  Physical Exam Constitutional:      Appearance: Normal appearance.  HENT:     Head: Normocephalic and atraumatic.  Cardiovascular:     Rate and Rhythm: Normal rate and regular rhythm.  Pulmonary:     Effort: Pulmonary effort is normal.     Breath sounds: Normal breath sounds. No wheezing, rhonchi or rales.  Skin:    General: Skin is warm and dry.  Neurological:     General: No focal deficit  present.     Mental Status: He is alert and oriented to person, place, and time. Mental status is at baseline.  Psychiatric:        Mood and Affect: Mood normal.        Behavior: Behavior normal.        Thought Content: Thought content normal.        Judgment: Judgment normal.      Lab Results:  CBC    Component Value Date/Time   WBC 11.8 (H) 08/01/2023 0951   WBC 17.2 (H) 03/08/2020 1815   RBC 4.39 08/01/2023 0951   HGB 13.8 08/01/2023 0951   HGB 14.1 06/23/2017 0758   HCT 42.2 08/01/2023 0951   HCT 42.2 06/23/2017 0758   PLT 133 (L) 08/01/2023 0951   PLT 37 Large & giant platelets (L) 06/23/2017 0758   MCV 96.1 08/01/2023 0951   MCV 92.7 06/23/2017 0758   MCH 31.4 08/01/2023 0951   MCHC 32.7 08/01/2023 0951   RDW 12.8  08/01/2023 0951   RDW 13.1 06/23/2017 0758   LYMPHSABS 3.5 08/01/2023 0951   LYMPHSABS 19.2 (H) 06/23/2017 0758   MONOABS 0.7 08/01/2023 0951   MONOABS 1.0 (H) 06/23/2017 0758   EOSABS 0.3 08/01/2023 0951   EOSABS 0.4 06/23/2017 0758   BASOSABS 0.1 08/01/2023 0951   BASOSABS 0.1 06/23/2017 0758    BMET    Component Value Date/Time   NA 138 08/01/2023 0951   NA 140 12/16/2014 1527   K 4.2 08/01/2023 0951   K 4.1 12/16/2014 1527   CL 105 08/01/2023 0951   CO2 28 08/01/2023 0951   CO2 24 12/16/2014 1527   GLUCOSE 94 08/01/2023 0951   GLUCOSE 71 12/16/2014 1527   BUN 27 (H) 08/01/2023 0951   BUN 17.2 12/16/2014 1527   CREATININE 1.53 (H) 08/01/2023 0951   CREATININE 1.3 12/16/2014 1527   CALCIUM 9.3 08/01/2023 0951   CALCIUM 8.7 12/16/2014 1527   GFRNONAA 45 (L) 08/01/2023 0951   GFRAA 54 (L) 03/08/2020 1815   GFRAA 59 (L) 10/10/2019 0811    BNP    Component Value Date/Time   BNP 217.4 (H) 08/17/2016 1806    ProBNP No results found for: "PROBNP"  Imaging: No results found.   Assessment & Plan:   1. Mild intermittent asthma without complication (Primary)  Assessment and Plan    Asthma Chronic dyspnea for the last couple of  years without exercise limitation. Spirometry shows normal lung function with FEV1 92% and total lung capacity at 100%. Reversibility in mid flow volumes (25-75%) post-albuterol  and increased diffusion capacity most likely indicating asthma. Patient has had asthma since adolescence. No current wheezing, chest tightness, or cough. Symptoms minimally impact daily life. Pulmonary rehabilitation ongoing, perceived as beneficial but unchanged dyspnea sensation. No chronic nasal or sinus congestion or allergies. BMI is 27, indicating overweight but not obesity, thus not contributing to elevated diffusion capacity. - Continue pulmonary rehabilitation. - Consider albuterol  inhaler PRN for symptom exacerbation or difficulty with deep inspiration. - Reassess if symptoms interfere with daily activities.   Antonio Baumgarten, NP 11/10/2023

## 2023-11-10 NOTE — Patient Instructions (Signed)
  Your spirometry test showed normal lung function, but there was significant improvement in mid flow volumes after using albuterol , indicating asthma. You should continue with your pulmonary rehabilitation. No indication for daily bronchodilator medication a this time. We will reassess if your symptoms start to interfere with your daily activities.  INSTRUCTIONS: Please continue with your pulmonary rehabilitation exercises. Use the albuterol  inhaler as needed if you have trouble breathing deeply or if your symptoms worsen. We will reassess your condition if your symptoms begin to interfere with your daily activities.  Follow-up 6 months with Dr. [name] or sooner if needed

## 2023-11-10 NOTE — Patient Instructions (Signed)
 Full PFT performed today.

## 2023-11-10 NOTE — Progress Notes (Signed)
 Full PFT performed today.

## 2023-11-14 ENCOUNTER — Encounter (HOSPITAL_COMMUNITY)
Admission: RE | Admit: 2023-11-14 | Discharge: 2023-11-14 | Disposition: A | Discharge status: Home / Self Care | Source: Ambulatory Visit | Attending: Pulmonary Disease | Admitting: Pulmonary Disease

## 2023-11-14 VITALS — Wt 164.2 lb

## 2023-11-14 DIAGNOSIS — R0602 Shortness of breath: Secondary | ICD-10-CM

## 2023-11-14 NOTE — Progress Notes (Signed)
 Daily Session Note  Patient Details  Name: [name] MRN: [id number] Date of Birth: 1940-07-23 Referring Provider:   [name] Pulmonary Rehab Walk Test from 08/07/2023 in [facility]  Referring Provider [name]       Encounter Date: 11/14/2023  Check In:  Session Check In - 11/14/23 0835       Check-In   Supervising physician immediately available to respond to emergencies [facility] MD immediately available    Physician(s) Charles Connor, NP    Location [facility]    Staff Present Atlas Lea, MS, ACSM-CEP, Exercise Physiologist;Randi Rochelle Chu, ACSM-CEP, Exercise Physiologist;Mary Arlester Ladd, RN, BSN    Virtual Visit No    Medication changes reported     No    Fall or balance concerns reported    No    Tobacco Cessation No Change    Warm-up and Cool-down Performed as group-led instruction    Resistance Training Performed Yes    VAD Patient? No    PAD/SET Patient? No      Pain Assessment   Currently in Pain? No/denies    Pain Score 0-No pain    Multiple Pain Sites No             Capillary Blood Glucose: No results found for this or any previous visit (from the past 24 hours).   Exercise Prescription Changes - 11/14/23 0900       Response to Exercise   Blood Pressure (Admit) 140/70    Blood Pressure (Exercise) 154/68    Blood Pressure (Exit) 126/70    Heart Rate (Admit) 47 bpm    Heart Rate (Exercise) 74 bpm    Heart Rate (Exit) 52 bpm    Oxygen Saturation (Admit) 98 %    Oxygen Saturation (Exercise) 97 %    Oxygen Saturation (Exit) 97 %    Rating of Perceived Exertion (Exercise) 11    Perceived Dyspnea (Exercise) 2    Duration Continue with 30 min of aerobic exercise without signs/symptoms of physical distress.    Intensity THRR unchanged      Progression   Progression Continue to progress workloads to maintain intensity without signs/symptoms of physical distress.    Average  METs 4.2      Resistance Training   Training Prescription Yes    Weight black bands    Reps 10-15    Time 10 Minutes      Treadmill   MPH 2.4    Grade 1.5    Minutes 15    METs 3      Recumbant Elliptical   Level 3    Minutes 15    METs 4.2             Social History   Tobacco Use  Smoking Status Never  Smokeless Tobacco Never    Goals Met:  Proper associated with RPD/PD & O2 Sat Exercise tolerated well No report of concerns or symptoms today Strength training completed today  Goals Unmet:  Not Applicable  Comments: Service time is from 0808 to 0921.    Dr. Genetta Kenning is Medical Director for Pulmonary Rehab at [facility].

## 2023-11-16 ENCOUNTER — Encounter (HOSPITAL_COMMUNITY)
Admission: RE | Admit: 2023-11-16 | Discharge: 2023-11-16 | Disposition: A | Discharge status: Home / Self Care | Source: Ambulatory Visit | Attending: Pulmonary Disease

## 2023-11-16 DIAGNOSIS — R0602 Shortness of breath: Secondary | ICD-10-CM

## 2023-11-16 NOTE — Progress Notes (Signed)
 Daily Session Note  Patient Details  Name: [name] MRN: [id number] Date of Birth: [birth year] Referring Provider:   [name] Pulmonary Rehab Walk Test from 08/07/2023 in [facility]  Referring Provider [name]       Encounter Date: 11/16/2023  Check In:  Session Check In - 11/16/23 7425       Check-In   Supervising physician immediately available to respond to emergencies [facility] MD immediately available    Physician(s) Marlana Silvan, NP    [facility]    Staff Present Atlas Lea, MS, ACSM-CEP, Exercise Physiologist;Randi Rochelle Chu, ACSM-CEP, Exercise Physiologist;Mary Arlester Ladd, RN, Angie Kenner, RT    Virtual Visit No    Medication changes reported     No    Fall or balance concerns reported    No    Tobacco Cessation No Change    Warm-up and Cool-down Performed as group-led instruction    Resistance Training Performed Yes    VAD Patient? No    PAD/SET Patient? No      Pain Assessment   Currently in Pain? No/denies    Pain Score 0-No pain    Multiple Pain Sites No             Capillary Blood Glucose: No results found for this or any previous visit (from the past 24 hours).    Social History   Tobacco Use  Smoking Status Never  Smokeless Tobacco Never    Goals Met:  Proper associated with RPD/PD & O2 Sat Independence with exercise equipment Exercise tolerated well No report of concerns or symptoms today Strength training completed today  Goals Unmet:  Not Applicable  Comments: Service time is from 0806 to 0936.    Dr. Genetta Kenning is Medical Director for Pulmonary Rehab at [facility].

## 2023-11-21 ENCOUNTER — Encounter (HOSPITAL_COMMUNITY)
Admission: RE | Admit: 2023-11-21 | Discharge: 2023-11-21 | Disposition: A | Discharge status: Home / Self Care | Source: Ambulatory Visit | Attending: Pulmonary Disease | Admitting: Pulmonary Disease

## 2023-11-21 DIAGNOSIS — R0602 Shortness of breath: Secondary | ICD-10-CM | POA: Diagnosis not present

## 2023-11-21 NOTE — Progress Notes (Signed)
 Daily Session Note  Patient Details  Name: [name] MRN: [id number] Date of Birth: 03-15-1941 Referring Provider:   [name] Pulmonary Rehab Walk Test from 08/07/2023 in [facility]  Referring Provider [name]       Encounter Date: 11/21/2023  Check In:  Session Check In - 11/21/23 4098       Check-In   Supervising physician immediately available to respond to emergencies [facility] MD immediately available    Physician(s) Koren Persons, NP    [facility]    Staff Present Atlas Lea, MS, ACSM-CEP, Exercise Physiologist;Randi Rochelle Chu, ACSM-CEP, Exercise Physiologist;Casey Felipe Horton, RT    Virtual Visit No    Medication changes reported     No    Fall or balance concerns reported    No    Tobacco Cessation No Change    Warm-up and Cool-down Performed as group-led instruction    Resistance Training Performed Yes    VAD Patient? No    PAD/SET Patient? No      Pain Assessment   Currently in Pain? No/denies    Multiple Pain Sites No             Capillary Blood Glucose: No results found for this or any previous visit (from the past 24 hours).    Social History   Tobacco Use  Smoking Status Never  Smokeless Tobacco Never    Goals Met:  Proper associated with RPD/PD & O2 Sat Exercise tolerated well No report of concerns or symptoms today Strength training completed today  Goals Unmet:  Not Applicable  Comments: Service time is from 0811 to 0925.    Dr. Genetta Kenning is Medical Director for Pulmonary Rehab at [facility].

## 2023-11-23 ENCOUNTER — Encounter (HOSPITAL_COMMUNITY)
Admission: RE | Admit: 2023-11-23 | Discharge: 2023-11-23 | Disposition: A | Discharge status: Home / Self Care | Source: Ambulatory Visit | Attending: Pulmonary Disease | Admitting: Pulmonary Disease

## 2023-11-23 DIAGNOSIS — R0602 Shortness of breath: Secondary | ICD-10-CM | POA: Diagnosis not present

## 2023-11-23 NOTE — Progress Notes (Signed)
 Daily Session Note  Patient Details  Name: [name] MRN: [id number] Date of Birth: 1940-12-11 Referring Provider:   [name] Pulmonary Rehab Walk Test from 08/07/2023 in [facility]  Referring Provider [name]       Encounter Date: 11/23/2023  Check In:  Session Check In - 11/23/23 0824       Check-In   Supervising physician immediately available to respond to emergencies [facility] MD immediately available    Physician(s) Palmer Bobo NP    [facility]    Staff Present Atlas Lea, MS, ACSM-CEP, Exercise Physiologist;Linnell Swords Rochelle Chu, ACSM-CEP, Exercise Physiologist;Casey Felipe Horton, RT    Virtual Visit No    Medication changes reported     No    Fall or balance concerns reported    No    Tobacco Cessation No Change    Warm-up and Cool-down Performed as group-led instruction    Resistance Training Performed Yes    VAD Patient? No    PAD/SET Patient? No      Pain Assessment   Currently in Pain? No/denies             Capillary Blood Glucose: No results found for this or any previous visit (from the past 24 hours).    Social History   Tobacco Use  Smoking Status Never  Smokeless Tobacco Never    Goals Met:  Independence with exercise equipment Exercise tolerated well No report of concerns or symptoms today Strength training completed today  Goals Unmet:  Not Applicable  Comments: Service time is from 0809 to 0931.    Dr. Genetta Kenning is Medical Director for Pulmonary Rehab at [facility].

## 2023-11-24 ENCOUNTER — Other Ambulatory Visit: Payer: Self-pay

## 2023-11-24 NOTE — Progress Notes (Signed)
 Specialty Pharmacy Ongoing Clinical Assessment Note  [name] is a 83 y.o. male who is being followed by the specialty pharmacy service for RxSp Oncology   Patient's specialty medication(s) reviewed today: Acalabrutinib  Maleate (Calquence )   Missed doses in the last 4 weeks: 0   Patient/Caregiver did not have any additional questions or concerns.   Therapeutic benefit summary: Patient is achieving benefit   Adverse events/side effects summary: No adverse events/side effects   Patient's therapy is appropriate to: Continue    Goals Addressed             This Visit's Progress    Slow Disease Progression   On track    Patient is on track. Patient will maintain adherence. Per provider note from 08/01/23 patient is hematologically stable.          Follow up: 6 months  [facility]

## 2023-11-28 ENCOUNTER — Encounter (HOSPITAL_COMMUNITY)
Admission: RE | Admit: 2023-11-28 | Discharge: 2023-11-28 | Disposition: A | Discharge status: Home / Self Care | Source: Ambulatory Visit | Attending: Pulmonary Disease

## 2023-11-28 VITALS — Wt 164.2 lb

## 2023-11-28 DIAGNOSIS — R0602 Shortness of breath: Secondary | ICD-10-CM

## 2023-11-28 NOTE — Progress Notes (Signed)
 Daily Session Note  Patient Details  Name: [name] MRN: [id number] Date of Birth: 1940/09/05 Referring Provider:   Gattis Mathews Pulmonary Rehab Walk Test from 08/07/2023 in [facility] for [facility], [facility]  Referring Provider [name]       Encounter Date: 11/28/2023  Check In:  Session Check In - 11/28/23 7829       Check-In   Supervising physician immediately available to respond to emergencies [facility] MD immediately available    Physician(s) Palmer Bobo NP    [facility]    Staff Present Atlas Lea, MS, ACSM-CEP, Exercise Physiologist;Ifeanyi Mickelson Rochelle Chu, ACSM-CEP, Exercise Physiologist;Casey Carmen Chol, RN, BSN    Virtual Visit No    Medication changes reported     No    Fall or balance concerns reported    No    Tobacco Cessation No Change    Warm-up and Cool-down Performed as group-led instruction    Resistance Training Performed Yes    VAD Patient? No    PAD/SET Patient? No      Pain Assessment   Currently in Pain? No/denies    Multiple Pain Sites No             Capillary Blood Glucose: No results found for this or any previous visit (from the past 24 hours).   Exercise Prescription Changes - 11/28/23 0900       Response to Exercise   Blood Pressure (Admit) 138/72    Blood Pressure (Exercise) 148/56    Blood Pressure (Exit) 120/70    Heart Rate (Admit) 47 bpm    Heart Rate (Exercise) 77 bpm    Heart Rate (Exit) 59 bpm    Oxygen Saturation (Admit) 98 %    Oxygen Saturation (Exercise) 98 %    Oxygen Saturation (Exit) 96 %    Rating of Perceived Exertion (Exercise) 11    Perceived Dyspnea (Exercise) 2    Duration Continue with 30 min of aerobic exercise without signs/symptoms of physical distress.    Intensity THRR unchanged      Progression   Progression Continue to progress workloads to maintain intensity without signs/symptoms of physical distress.      Resistance  Training   Training Prescription Yes    Weight black bands    Reps 10-15    Time 10 Minutes      Treadmill   MPH 2.4    Grade 1.5    Minutes 15    METs 3      Recumbant Elliptical   Level 3    Minutes 15    METs 4.5             Social History   Tobacco Use  Smoking Status Never  Smokeless Tobacco Never    Goals Met:  Independence with exercise equipment Exercise tolerated well No report of concerns or symptoms today Strength training completed today  Goals Unmet:  Not Applicable  Comments: Service time is from 0808 to 0928.    Dr. Genetta Kenning is Medical Director for Pulmonary Rehab at [facility].

## 2023-11-29 NOTE — Progress Notes (Signed)
 Pulmonary Individual Treatment Plan  Patient Details  Name: [name] MRN: [id number] Date of Birth: 1940-08-14 Referring Provider:   [name] Pulmonary Rehab Walk Test from 08/07/2023 in [facility]  Referring Provider [name]       Initial Encounter Date:  Flowsheet Row Pulmonary Rehab Walk Test from 08/07/2023 in [facility]  Date 08/07/23       Visit Diagnosis: Shortness of breath  Patient's Home Medications on Admission:   Current Outpatient Medications:    acalabrutinib  maleate (CALQUENCE ) 100 MG tablet, Take 1 tablet (100 mg) by mouth daily., Disp: 30 tablet, Rfl: 4   acetaminophen  (TYLENOL ) 325 MG tablet, Take 325 mg by mouth daily as needed for headache (pain)., Disp: , Rfl:    cholecalciferol  (VITAMIN D3) 25 MCG (1000 UNIT) tablet, Take 1,000 Units by mouth daily. , Disp: , Rfl:    clobetasol cream (TEMOVATE) 0.05 %, Apply 1 Application topically as needed., Disp: , Rfl:    COVID-19 mRNA vaccine 2023-2024 (COMIRNATY ) syringe, Inject 0.3 mLs into the muscle daily., Disp: 0.3 mL, Rfl: 0   EPINEPHrine  (EPIPEN ) 0.3 mg/0.3 mL DEVI, Inject 0.3 mLs (0.3 mg total) into the muscle once., Disp: 1 Device, Rfl: 0   influenza vaccine adjuvanted (FLUAD) 0.5 ML injection, Inject into the muscle., Disp: 0.5 mL, Rfl: 0   metoprolol  succinate (TOPROL -XL) 25 MG 24 hr tablet, TAKE 1 TABLET(25 MG) BY MOUTH DAILY, Disp: 90 tablet, Rfl: 2   olmesartan (BENICAR) 40 MG tablet, Take 40 mg by mouth daily., Disp: , Rfl:    simvastatin  (ZOCOR ) 40 MG tablet, Take 20 mg by mouth daily. , Disp: , Rfl:   Past Medical History: Past Medical History:  Diagnosis Date   Acute encephalopathy 08/17/2016   Acute renal failure superimposed on stage 3 chronic kidney disease (HCC) 10/15/2018   AKI (acute kidney injury) (HCC) 08/16/2016   Arthritis    Bacteremia due to Enterobacter  species 08/19/2016   Carpal tunnel syndrome, bilateral 10/09/2017   Chest tightness 10/15/2018   CLL (chronic lymphocytic leukemia) (HCC) 12/16/2014   Dyspnea 09/20/2012   Elevated troponin 10/16/2018   Enterobacter sepsis (HCC) 08/19/2016   Fall 10/15/2018   GERD (gastroesophageal reflux disease)    Hepatitis 1966   Drug reaction after taking medication   Hyperglycemia    Hyperlipemia    Hypertension    Hypokalemia 08/16/2016   Lung nodule seen on imaging study    bilateral lungs   Nasal bone fracture 10/15/2018   Right foot pain    SAH (subarachnoid hemorrhage) (HCC) 10/15/2018   Syncope 10/15/2018   Thrombocytopenia (HCC) 10/15/2018   Urinary tract infection with hematuria     Tobacco Use: Social History   Tobacco Use  Smoking Status Never  Smokeless Tobacco Never    Labs: Review Flowsheet       Latest Ref Rng & Units 02/03/2011 08/16/2016 10/16/2018  Labs for ITP Cardiac and Pulmonary Rehab  Cholestrol 0 - 200 mg/dL - - 409   LDL (calc) 0 - 99 mg/dL - - 61   HDL-C >81 mg/dL - - 20   Trlycerides <191 mg/dL - - 478   Hemoglobin G9F 4.8 - 5.6 % - - 5.4   PH, Arterial 7.350 - 7.450 - 7.501  -  PCO2 arterial 32.0 - 48.0 mmHg - 25.8  -  Bicarbonate 20.0 - 28.0 mmol/L - 20.0  -  TCO2 0 - 100 mmol/L 23  - -  Acid-base deficit 0.0 - 2.0 mmol/L - 1.7  -  O2 Saturation % - 95.3  -    Capillary Blood Glucose: Lab Results  Component Value Date   GLUCAP 79 10/19/2018   GLUCAP 90 10/19/2018   GLUCAP 88 10/16/2018   GLUCAP 101 (H) 10/16/2018     Pulmonary Assessment Scores:  Pulmonary Assessment Scores     Row Name 08/07/23 1044         ADL UCSD   ADL Phase Entry     SOB Score total 10       CAT Score   CAT Score 4       mMRC Score   mMRC Score 0             UCSD: Self-administered rating of dyspnea associated with activities of daily living (ADLs) 6-point scale (0 = not at all to 5 = maximal or unable to do because of breathlessness)  Scoring Scores range  from 0 to 120.  Minimally important difference is 5 units  CAT: CAT can identify the health impairment of COPD patients and is better correlated with disease progression.  CAT has a scoring range of zero to 40. The CAT score is classified into four groups of low (less than 10), medium (10 - 20), high (21-30) and very high (31-40) based on the impact level of disease on health status. A CAT score over 10 suggests significant symptoms.  A worsening CAT score could be explained by an exacerbation, poor medication adherence, poor inhaler technique, or progression of COPD or comorbid conditions.  CAT MCID is 2 points  mMRC: mMRC (Modified Medical Research Council) Dyspnea Scale is used to assess the degree of baseline functional disability in patients of respiratory disease due to dyspnea. No minimal important difference is established. A decrease in score of 1 point or greater is considered a positive change.   Pulmonary Function Assessment:  Pulmonary Function Assessment - 08/07/23 1044       Breath   Shortness of Breath Yes;Limiting activity             Exercise Target Goals: Exercise Program Goal: Individual exercise prescription set using results from initial 6 min walk test and THRR while considering  patient's activity barriers and safety.   Exercise Prescription Goal: Initial exercise prescription builds to 30-45 minutes a day of aerobic activity, 2-3 days per week.  Home exercise guidelines will be given to patient during program as part of exercise prescription that the participant will acknowledge.  Activity Barriers & Risk Stratification:  Activity Barriers & Cardiac Risk Stratification - 08/07/23 1044       Activity Barriers & Cardiac Risk Stratification   Activity Barriers Arthritis;Muscular Weakness;Deconditioning;Joint Problems             6 Minute Walk:  6 Minute Walk     Row Name 08/07/23 1139         6 Minute Walk   Phase Initial     Distance 1410  feet     Walk Time 6 minutes     # of Rest Breaks 0     MPH 2.67     METS 2.05     RPE 9     Perceived Dyspnea  0.5     VO2 Peak 7.18     Symptoms No     Resting HR 59 bpm     Resting BP 124/72  Resting Oxygen Saturation  100 %     Exercise Oxygen Saturation  during 6 min walk 98 %     Max Ex. HR 74 bpm     Max Ex. BP 138/72     2 Minute Post BP 122/70       Interval HR   1 Minute HR 71     2 Minute HR 72     3 Minute HR 72     4 Minute HR 72     5 Minute HR 73     6 Minute HR 74     2 Minute Post HR 52     Interval Heart Rate? Yes       Interval Oxygen   Interval Oxygen? Yes     Baseline Oxygen Saturation % 100 %     1 Minute Oxygen Saturation % 99 %     1 Minute Liters of Oxygen 0 L     2 Minute Oxygen Saturation % 99 %     2 Minute Liters of Oxygen 0 L     3 Minute Oxygen Saturation % 100 %     3 Minute Liters of Oxygen 0 L     4 Minute Oxygen Saturation % 98 %     4 Minute Liters of Oxygen 0 L     5 Minute Oxygen Saturation % 100 %     5 Minute Liters of Oxygen 0 L     6 Minute Oxygen Saturation % 100 %     6 Minute Liters of Oxygen 0 L     2 Minute Post Oxygen Saturation % 100 %     2 Minute Post Liters of Oxygen 0 L              Oxygen Initial Assessment:  Oxygen Initial Assessment - 08/07/23 1043       Home Oxygen   Home Oxygen Device None    Sleep Oxygen Prescription None    Home Exercise Oxygen Prescription None    Home Resting Oxygen Prescription None      Initial 6 min Walk   Oxygen Used None      Program Oxygen Prescription   Program Oxygen Prescription None      Intervention   Short Term Goals To learn and understand importance of monitoring SPO2 with pulse oximeter and demonstrate accurate use of the pulse oximeter.;To learn and understand importance of maintaining oxygen saturations>88%;To learn and demonstrate proper pursed lip breathing techniques or other breathing techniques. ;To learn and demonstrate proper use of respiratory  medications    Long  Term Goals Verbalizes importance of monitoring SPO2 with pulse oximeter and return demonstration;Maintenance of O2 saturations>88%;Exhibits proper breathing techniques, such as pursed lip breathing or other method taught during program session;Compliance with respiratory medication;Demonstrates proper use of MDI's             Oxygen Re-Evaluation:  Oxygen Re-Evaluation     Row Name 08/08/23 0748 09/25/23 0953 10/25/23 0910 11/22/23 0856       Program Oxygen Prescription   Program Oxygen Prescription None None None None      Home Oxygen   Home Oxygen Device None None None None    Sleep Oxygen Prescription None None None None    Home Exercise Oxygen Prescription None None None None    Home Resting Oxygen Prescription None None None None      Goals/Expected Outcomes   Short Term Goals To learn and understand  importance of monitoring SPO2 with pulse oximeter and demonstrate accurate use of the pulse oximeter.;To learn and understand importance of maintaining oxygen saturations>88%;To learn and demonstrate proper pursed lip breathing techniques or other breathing techniques. ;To learn and demonstrate proper use of respiratory medications To learn and understand importance of monitoring SPO2 with pulse oximeter and demonstrate accurate use of the pulse oximeter.;To learn and understand importance of maintaining oxygen saturations>88%;To learn and demonstrate proper pursed lip breathing techniques or other breathing techniques. ;To learn and demonstrate proper use of respiratory medications To learn and understand importance of monitoring SPO2 with pulse oximeter and demonstrate accurate use of the pulse oximeter.;To learn and understand importance of maintaining oxygen saturations>88%;To learn and demonstrate proper pursed lip breathing techniques or other breathing techniques. ;To learn and demonstrate proper use of respiratory medications To learn and understand importance  of monitoring SPO2 with pulse oximeter and demonstrate accurate use of the pulse oximeter.;To learn and understand importance of maintaining oxygen saturations>88%;To learn and demonstrate proper pursed lip breathing techniques or other breathing techniques. ;To learn and demonstrate proper use of respiratory medications    Long  Term Goals Verbalizes importance of monitoring SPO2 with pulse oximeter and return demonstration;Maintenance of O2 saturations>88%;Exhibits proper breathing techniques, such as pursed lip breathing or other method taught during program session;Compliance with respiratory medication;Demonstrates proper use of MDI's Verbalizes importance of monitoring SPO2 with pulse oximeter and return demonstration;Maintenance of O2 saturations>88%;Exhibits proper breathing techniques, such as pursed lip breathing or other method taught during program session;Compliance with respiratory medication;Demonstrates proper use of MDI's Verbalizes importance of monitoring SPO2 with pulse oximeter and return demonstration;Maintenance of O2 saturations>88%;Exhibits proper breathing techniques, such as pursed lip breathing or other method taught during program session;Compliance with respiratory medication;Demonstrates proper use of MDI's Verbalizes importance of monitoring SPO2 with pulse oximeter and return demonstration;Maintenance of O2 saturations>88%;Exhibits proper breathing techniques, such as pursed lip breathing or other method taught during program session;Compliance with respiratory medication;Demonstrates proper use of MDI's    Goals/Expected Outcomes Compliance and understanding of oxygen saturation monitoring and breathing techniques to decrease shortness of breath Compliance and understanding of oxygen saturation monitoring and breathing techniques to decrease shortness of breath Compliance and understanding of oxygen saturation monitoring and breathing techniques to decrease shortness of breath  Compliance and understanding of oxygen saturation monitoring and breathing techniques to decrease shortness of breath             Oxygen Discharge (Final Oxygen Re-Evaluation):  Oxygen Re-Evaluation - 11/22/23 0856       Program Oxygen Prescription   Program Oxygen Prescription None      Home Oxygen   Home Oxygen Device None    Sleep Oxygen Prescription None    Home Exercise Oxygen Prescription None    Home Resting Oxygen Prescription None      Goals/Expected Outcomes   Short Term Goals To learn and understand importance of monitoring SPO2 with pulse oximeter and demonstrate accurate use of the pulse oximeter.;To learn and understand importance of maintaining oxygen saturations>88%;To learn and demonstrate proper pursed lip breathing techniques or other breathing techniques. ;To learn and demonstrate proper use of respiratory medications    Long  Term Goals Verbalizes importance of monitoring SPO2 with pulse oximeter and return demonstration;Maintenance of O2 saturations>88%;Exhibits proper breathing techniques, such as pursed lip breathing or other method taught during program session;Compliance with respiratory medication;Demonstrates proper use of MDI's    Goals/Expected Outcomes Compliance and understanding of oxygen saturation monitoring and breathing techniques to decrease  shortness of breath             Initial Exercise Prescription:  Initial Exercise Prescription - 08/07/23 1100       Date of Initial Exercise RX and Referring Provider   Date 08/07/23    Referring Provider [name]    Expected Discharge Date 11/02/23      Treadmill   MPH 1.8    Grade 0    Minutes 15      Recumbant Elliptical   Level 1    Minutes 15    METs 2      Prescription Details   Frequency (times per week) 2    Duration Progress to 30 minutes of continuous aerobic without signs/symptoms of physical distress      Intensity   THRR 40-80% of Max Heartrate 55-110    Ratings of  Perceived Exertion 11-13    Perceived Dyspnea 0-4      Progression   Progression Continue to progress workloads to maintain intensity without signs/symptoms of physical distress.      Resistance Training   Training Prescription Yes    Weight blue bands    Reps 10-15             Perform Capillary Blood Glucose checks as needed.  Exercise Prescription Changes:   Exercise Prescription Changes     Row Name 08/22/23 0900 09/05/23 0900 09/19/23 0900 09/28/23 0900 10/03/23 0900     Response to Exercise   Blood Pressure (Admit) 136/80 108/66 104/62 -- 136/74   Blood Pressure (Exercise) 130/66 132/60 132/66 -- 146/76   Blood Pressure (Exit) 112/66 100/64 108/58 -- 108/60   Heart Rate (Admit) 51 bpm 50 bpm 63 bpm -- 53 bpm   Heart Rate (Exercise) 72 bpm 73 bpm 79 bpm -- 90 bpm   Heart Rate (Exit) 59 bpm 57 bpm 60 bpm -- 59 bpm   Oxygen Saturation (Admit) 100 % 98 % 98 % -- 99 %   Oxygen Saturation (Exercise) 98 % 98 % 96 % -- 97 %   Oxygen Saturation (Exit) 97 % 98 % 97 % -- 97 %   Rating of Perceived Exertion (Exercise) 13 12.5 12 -- 12   Perceived Dyspnea (Exercise) 2.3 2 2  -- 2   Duration Continue with 30 min of aerobic exercise without signs/symptoms of physical distress. Continue with 30 min of aerobic exercise without signs/symptoms of physical distress. Continue with 30 min of aerobic exercise without signs/symptoms of physical distress. -- Continue with 30 min of aerobic exercise without signs/symptoms of physical distress.   Intensity THRR unchanged THRR unchanged THRR unchanged -- THRR unchanged     Progression   Progression Continue to progress workloads to maintain intensity without signs/symptoms of physical distress. Continue to progress workloads to maintain intensity without signs/symptoms of physical distress. Continue to progress workloads to maintain intensity without signs/symptoms of physical distress. -- Continue to progress workloads to maintain intensity without  signs/symptoms of physical distress.   Average METs 2.9 3.5 -- -- --     Resistance Training   Training Prescription Yes Yes Yes -- Yes   Weight blue bands blue bands blue bands -- black bands   Reps 10-15 10-15 10-15 -- 10-15   Time 10 Minutes 10 Minutes 10 Minutes -- 10 Minutes     Treadmill   MPH 1.8 -- 2 -- 2.2   Grade 0 -- 1 -- 1   Minutes 15 -- 15 -- 15   METs 2.2 -- -- --  2.8     Recumbant Elliptical   Level 1 2 2  -- 2   RPM 50 -- -- -- --   Minutes 15 15 15  -- 15   METs 2.9 3.5 3.9 -- 4.5     Track   Laps -- 13.5 -- -- --   Minutes -- 15 -- -- --   METs -- 3 -- -- --     Home Exercise Plan   Plans to continue exercise at -- -- -- Home (comment) --   Frequency -- -- -- --  n/a --   Initial Home Exercises Provided -- -- -- 09/28/23 --    Row Name 10/17/23 0900 10/26/23 0930 11/14/23 0900 11/28/23 0900       Response to Exercise   Blood Pressure (Admit) 130/60 122/64 140/70 138/72    Blood Pressure (Exercise) 132/64 -- 154/68 148/56    Blood Pressure (Exit) 104/52 104/62 126/70 120/70    Heart Rate (Admit) 51 bpm 58 bpm 47 bpm 47 bpm    Heart Rate (Exercise) 79 bpm 78 bpm 74 bpm 77 bpm    Heart Rate (Exit) 61 bpm 62 bpm 52 bpm 59 bpm    Oxygen Saturation (Admit) 99 % 97 % 98 % 98 %    Oxygen Saturation (Exercise) 99 % 97 % 97 % 98 %    Oxygen Saturation (Exit) 98 % 96 % 97 % 96 %    Rating of Perceived Exertion (Exercise) 12 12 11 11     Perceived Dyspnea (Exercise) 2 2 2 2     Duration Continue with 30 min of aerobic exercise without signs/symptoms of physical distress. Continue with 30 min of aerobic exercise without signs/symptoms of physical distress. Continue with 30 min of aerobic exercise without signs/symptoms of physical distress. Continue with 30 min of aerobic exercise without signs/symptoms of physical distress.    Intensity THRR unchanged THRR unchanged THRR unchanged THRR unchanged      Progression   Progression Continue to progress workloads to  maintain intensity without signs/symptoms of physical distress. Continue to progress workloads to maintain intensity without signs/symptoms of physical distress. Continue to progress workloads to maintain intensity without signs/symptoms of physical distress. Continue to progress workloads to maintain intensity without signs/symptoms of physical distress.    Average METs -- -- 4.2 --      Resistance Training   Training Prescription Yes Yes Yes Yes    Weight black bands black bands black bands black bands    Reps 10-15 10-15 10-15 10-15    Time 10 Minutes 10 Minutes 10 Minutes 10 Minutes      Treadmill   MPH 2.3 2.3 2.4 2.4    Grade 1 1.5 1.5 1.5    Minutes 15 15 15 15     METs 2.9 3 3 3       Recumbant Elliptical   Level 3 3 3 3     RPM 63 -- -- --    Watts 84 -- -- --    Minutes 15 15 15 15     METs 1.3 4.4 4.2 4.5             Exercise Comments:   Exercise Comments     Row Name 08/17/23 0948 09/28/23 0944         Exercise Comments Pt completed first day of group exercise. He exercised on the recumbent elliptical for 15 min on level 1, METs 2.4. He then walked on the treadmill for 15 min, 1.8 mph, 0 incline, METs  2.3. Tolerated well although admitted to fatigue and that his workout was harder than he walks at home. Performed warm up and cool down without limitations. Discussed METs with good reception. Completed home exercise plan. Marcques is currently exercising at home. He walks 5-7 days/wk for about 50 min/day. Valerio mentioned he walks 1.8 miles but is not pushing himself. I encouraged Trigo to increase his speed and complete the 1.8 miles in 40-45 min. Leul agreed with my recommendations. He seems motivated to exercise and improve his functional capacity. I am confident in [place] exercising at home.               Exercise Goals and Review:   Exercise Goals     Row Name 08/07/23 1151             Exercise Goals   Increase Physical Activity Yes       Intervention  Provide advice, education, support and counseling about physical activity/exercise needs.;Develop an individualized exercise prescription for aerobic and resistive training based on initial evaluation findings, risk stratification, comorbidities and participant's personal goals.       Expected Outcomes Short Term: Attend rehab on a regular basis to increase amount of physical activity.;Long Term: Exercising regularly at least 3-5 days a week.;Long Term: Add in home exercise to make exercise part of routine and to increase amount of physical activity.       Increase Strength and Stamina Yes       Intervention Provide advice, education, support and counseling about physical activity/exercise needs.;Develop an individualized exercise prescription for aerobic and resistive training based on initial evaluation findings, risk stratification, comorbidities and participant's personal goals.       Expected Outcomes Short Term: Increase workloads from initial exercise prescription for resistance, speed, and METs.;Short Term: Perform resistance training exercises routinely during rehab and add in resistance training at home;Long Term: Improve cardiorespiratory fitness, muscular endurance and strength as measured by increased METs and functional capacity ( )       Able to understand and use rate of perceived exertion (RPE) scale Yes       Intervention Provide education and explanation on how to use RPE scale       Expected Outcomes Short Term: Able to use RPE daily in rehab to express subjective intensity level;Long Term:  Able to use RPE to guide intensity level when exercising independently       Able to understand and use Dyspnea scale Yes       Intervention Provide education and explanation on how to use Dyspnea scale       Expected Outcomes Short Term: Able to use Dyspnea scale daily in rehab to express subjective sense of shortness of breath during exertion;Long Term: Able to use Dyspnea scale to guide  intensity level when exercising independently       Knowledge and understanding of Target Heart Rate Range (THRR) Yes       Intervention Provide education and explanation of THRR including how the numbers were predicted and where they are located for reference       Expected Outcomes Short Term: Able to state/look up THRR;Long Term: Able to use THRR to govern intensity when exercising independently;Short Term: Able to use daily as guideline for intensity in rehab       Understanding of Exercise Prescription Yes       Intervention Provide education, explanation, and written materials on patient's individual exercise prescription       Expected Outcomes Short Term: Able  to explain program exercise prescription;Long Term: Able to explain home exercise prescription to exercise independently                Exercise Goals Re-Evaluation :  Exercise Goals Re-Evaluation     Row Name 08/08/23 0747 08/30/23 0940 09/25/23 0951 10/25/23 0906 11/22/23 0852     Exercise Goal Re-Evaluation   Exercise Goals Review Increase Physical Activity;Able to understand and use Dyspnea scale;Understanding of Exercise Prescription;Increase Strength and Stamina;Knowledge and understanding of Target Heart Rate Range (THRR);Able to understand and use rate of perceived exertion (RPE) scale Increase Physical Activity;Able to understand and use Dyspnea scale;Understanding of Exercise Prescription;Increase Strength and Stamina;Knowledge and understanding of Target Heart Rate Range (THRR);Able to understand and use rate of perceived exertion (RPE) scale Increase Physical Activity;Able to understand and use Dyspnea scale;Understanding of Exercise Prescription;Increase Strength and Stamina;Knowledge and understanding of Target Heart Rate Range (THRR);Able to understand and use rate of perceived exertion (RPE) scale Increase Physical Activity;Able to understand and use Dyspnea scale;Understanding of Exercise Prescription;Increase  Strength and Stamina;Knowledge and understanding of Target Heart Rate Range (THRR);Able to understand and use rate of perceived exertion (RPE) scale Increase Physical Activity;Able to understand and use Dyspnea scale;Understanding of Exercise Prescription;Increase Strength and Stamina;Knowledge and understanding of Target Heart Rate Range (THRR);Able to understand and use rate of perceived exertion (RPE) scale   Comments Rowdy is scheduled to begin exercise on 2/27. Will monitor and progress as able. Joel has completed 4 exercise sessions. He exercises for 15 min on the recumbent elliptical and treadmill. Sylvester Evert averages 3.7 METs at level 1 on the recumbent elliptical and 2.3 METs at 1.8 mph on the treadmill. He performs the warmup and cooldown standing without limitations. It is too soon to notate any discernable progressions. Will continue to monitor and progress as able. Sherlock has completed 11 exercise sessions. He exercises for 15 min on the recumbent elliptical and treadmill. Sylvester Evert averages 4.0 METs at level 2 on the recumbent elliptical and 2.6 METs at 2 mph and 1% incline on the treadmill. He performs the warmup and cooldown standing without limitations. Lavin has increased his level for the recumbent elliptical and speed and incline on the treadmill. METs have increased for both exercise modes. Tabari tolerates progressions well. Will continue to monitor and progress as able. Sammuel has completed 20 exercise sessions. He exercises for 15 min on the recumbent elliptical and treadmill. Sylvester Evert averages 4.3 METs at level 3 on the recumbent elliptical and 3.2 METs at 2.3 mph and 1.5% incline on the treadmill. He performs the warmup and cooldown standing without limitations. Siah continues to increase his level for the recumbent elliptical and speed and incline on the treadmill. He is hesitant to increase but will eventually increase. We have discussed home exercise as Cirilo is currently exercising at home. I am  confident in him carrying out an exercise at home. Will continue to monitor and progress as able. Octavian has completed 20 exercise sessions. He exercises for 15 min on the recumbent elliptical and treadmill. Sylvester Evert averages 4.5 METs at level 3 on the recumbent elliptical and 3.4 METs at 2.4 mph and 2% incline on the treadmill. He performs the warmup and cooldown standing without limitations. Rohin has increased his speed and incline on the treadmill as METs have increased. Patient is very hesistant to increase despite encouragement from staff. Will continue to monitor and progress as able.   Expected Outcomes Though exercise at rehab and home, the patient will decrease shortness of  breath with daily activities and feel confident in carrying out an exercise regimen at home. Though exercise at rehab and home, the patient will decrease shortness of breath with daily activities and feel confident in carrying out an exercise regimen at home. Though exercise at rehab and home, the patient will decrease shortness of breath with daily activities and feel confident in carrying out an exercise regimen at home. Though exercise at rehab and home, the patient will decrease shortness of breath with daily activities and feel confident in carrying out an exercise regimen at home. Though exercise at rehab and home, the patient will decrease shortness of breath with daily activities and feel confident in carrying out an exercise regimen at home.            Discharge Exercise Prescription (Final Exercise Prescription Changes):  Exercise Prescription Changes - 11/28/23 0900       Response to Exercise   Blood Pressure (Admit) 138/72    Blood Pressure (Exercise) 148/56    Blood Pressure (Exit) 120/70    Heart Rate (Admit) 47 bpm    Heart Rate (Exercise) 77 bpm    Heart Rate (Exit) 59 bpm    Oxygen Saturation (Admit) 98 %    Oxygen Saturation (Exercise) 98 %    Oxygen Saturation (Exit) 96 %    Rating of Perceived  Exertion (Exercise) 11    Perceived Dyspnea (Exercise) 2    Duration Continue with 30 min of aerobic exercise without signs/symptoms of physical distress.    Intensity THRR unchanged      Progression   Progression Continue to progress workloads to maintain intensity without signs/symptoms of physical distress.      Resistance Training   Training Prescription Yes    Weight black bands    Reps 10-15    Time 10 Minutes      Treadmill   MPH 2.4    Grade 1.5    Minutes 15    METs 3      Recumbant Elliptical   Level 3    Minutes 15    METs 4.5             Nutrition:  Target Goals: Understanding of nutrition guidelines, daily intake of sodium 1500mg , cholesterol 200mg , calories 30% from fat and 7% or less from saturated fats, daily to have 5 or more servings of fruits and vegetables.  Biometrics:    Nutrition Therapy Plan and Nutrition Goals:  Nutrition Therapy & Goals - 10/31/23 1225       Nutrition Therapy   Diet general healthy diet      Personal Nutrition Goals   Nutrition Goal Patient to improve diet quality by using the plate method as a guide for meal planning to include lean protein/plant protein, fruits, vegetables, whole grains, nonfat dairy as part of a well-balanced diet.   goal in action.   Comments Goals in action. Daneil has medical history of CAD, HTN, CLL, CKD3, shortness of breath. Per cardiology notes, he continues mindfulness of sodum and saturated fat intake. He continues to monitor blood pressure at home. LDL remains at goal.He is UJWJ1.9# since starting with our program. Sly will benefit from participation in pulmonary rehab for nutrition and exercise support.      Intervention Plan   Intervention Prescribe, educate and counsel regarding individualized specific dietary modifications aiming towards targeted core components such as weight, hypertension, lipid management, diabetes, heart failure and other comorbidities.;Nutrition handout(s) given to  patient.    Expected Outcomes  Short Term Goal: Understand basic principles of dietary content, such as calories, fat, sodium, cholesterol and nutrients.;Long Term Goal: Adherence to prescribed nutrition plan.             Nutrition Assessments:  MEDIFICTS Score Key: >=70 Need to make dietary changes  40-70 Heart Healthy Diet <= 40 Therapeutic Level Cholesterol Diet   Picture Your Plate Scores: <40 Unhealthy dietary pattern with much room for improvement. 41-50 Dietary pattern unlikely to meet recommendations for good health and room for improvement. 51-60 More healthful dietary pattern, with some room for improvement.  >60 Healthy dietary pattern, although there may be some specific behaviors that could be improved.    Nutrition Goals Re-Evaluation:  Nutrition Goals Re-Evaluation     Row Name 08/29/23 1039 09/28/23 0939 10/31/23 1225         Goals   Current Weight -- 164 lb 3.9 oz (74.5 kg) 165 lb 9.1 oz (75.1 kg)     Comment lipids WNL, LDL 67 no new labs; most recent labs  lipids WNL, LDL 67 no new labs; most recent labs lipids WNL, LDL 67     Expected Outcome Winner has medical history of CAD, HTN, CLL, CKD3, shortness of breath. Per cardiology notes, he continues mindfulness of sodum and saturated fat intake. He continues to monitor blood pressure at home. LDL remains at goal. Prestyn will benefit from participation in pulmonary rehab for nutrition and exercise support. Goals in action. Chrishaun has medical history of CAD, HTN, CLL, CKD3, shortness of breath. Per cardiology notes, he continues mindfulness of sodum and saturated fat intake. He continues to monitor blood pressure at home. LDL remains at goal.He is JWJX9.1# since starting with our program. Camden will benefit from participation in pulmonary rehab for nutrition and exercise support. Goals in action. Keiran has medical history of CAD, HTN, CLL, CKD3, shortness of breath. Per cardiology notes, he continues mindfulness of sodum  and saturated fat intake. He continues to monitor blood pressure at home. LDL remains at goal.He is YNWG9.5# since starting with our program. Lajuane will benefit from participation in pulmonary rehab for nutrition and exercise support.              Nutrition Goals Discharge (Final Nutrition Goals Re-Evaluation):  Nutrition Goals Re-Evaluation - 10/31/23 1225       Goals   Current Weight 165 lb 9.1 oz (75.1 kg)    Comment no new labs; most recent labs lipids WNL, LDL 67    Expected Outcome Goals in action. Linford has medical history of CAD, HTN, CLL, CKD3, shortness of breath. Per cardiology notes, he continues mindfulness of sodum and saturated fat intake. He continues to monitor blood pressure at home. LDL remains at goal.He is AOZH0.8# since starting with our program. Virgie will benefit from participation in pulmonary rehab for nutrition and exercise support.             Psychosocial: Target Goals: Acknowledge presence or absence of significant depression and/or stress, maximize coping skills, provide positive support system. Participant is able to verbalize types and ability to use techniques and skills needed for reducing stress and depression.  Initial Review & Psychosocial Screening:  Initial Psych Review & Screening - 08/07/23 1038       Initial Review   Current issues with None Identified      Family Dynamics   Good Support System? Yes    Comments spouse, daughter      Barriers   Psychosocial barriers to participate in  program There are no identifiable barriers or psychosocial needs.      Screening Interventions   Interventions Encouraged to exercise             Quality of Life Scores:  Scores of 19 and below usually indicate a poorer quality of life in these areas.  A difference of  2-3 points is a clinically meaningful difference.  A difference of 2-3 points in the total score of the Quality of Life Index has been associated with significant improvement in  overall quality of life, self-image, physical symptoms, and general health in studies assessing change in quality of life.  PHQ-9: Review Flowsheet       08/07/2023  Depression screen PHQ 2/9  Decreased Interest 0  Down, Depressed, Hopeless 0  PHQ - 2 Score 0  Altered sleeping 0  Tired, decreased energy 0  Change in appetite 0  Feeling bad or failure about yourself  0  Trouble concentrating 0  Moving slowly or fidgety/restless 0  Suicidal thoughts 0  PHQ-9 Score 0  Difficult doing work/chores Not difficult at all   Interpretation of Total Score  Total Score Depression Severity:  1-4 = Minimal depression, 5-9 = Mild depression, 10-14 = Moderate depression, 15-19 = Moderately severe depression, 20-27 = Severe depression   Psychosocial Evaluation and Intervention:  Psychosocial Evaluation - 08/07/23 1039       Psychosocial Evaluation & Interventions   Interventions Encouraged to exercise with the program and follow exercise prescription    Comments Mickie denies any psychosocial barriers at this time.    Expected Outcomes For Dannel to participate in rehab free of psychosocial concerns.    Continue Psychosocial Services  No Follow up required             Psychosocial Re-Evaluation:  Psychosocial Re-Evaluation     Row Name 08/08/23 [phone] 08/28/23 1553 09/27/23 1053 10/23/23 0955 11/20/23 0944     Psychosocial Re-Evaluation   Current issues with None Identified None Identified None Identified None Identified None Identified   Comments No changes since orientation. Braxson is scheduled to start the program next week Stryder continues to deny any psychosocial barriers or concerns a thtis time. Psychosocial monthly re-evaluation is as follows: Brees continues to deny any psychosocial barriers or concerns a thtis time. Daniell continues to deny any psychosocial barriers or concerns a thtis time. Riccardo continues to deny any psychosocial barriers or concerns a thtis time.   Expected  Outcomes For Breeze to participate in rehab free of psychosocial barriers  or concerns For Latwan to participate in rehab free of psychosocial barriers  or concerns For Fynn to participate in rehab free of psychosocial barriers  or concerns For Erskin to participate in rehab free of psychosocial barriers  or concerns For Clayvon to participate in rehab free of psychosocial barriers  or concerns   Interventions Encouraged to attend Pulmonary Rehabilitation for the exercise Encouraged to attend Pulmonary Rehabilitation for the exercise Encouraged to attend Pulmonary Rehabilitation for the exercise Encouraged to attend Pulmonary Rehabilitation for the exercise Encouraged to attend Pulmonary Rehabilitation for the exercise   Continue Psychosocial Services  No Follow up required No Follow up required No Follow up required No Follow up required No Follow up required            Psychosocial Discharge (Final Psychosocial Re-Evaluation):  Psychosocial Re-Evaluation - 11/20/23 0944       Psychosocial Re-Evaluation   Current issues with None Identified    Comments Horton continues  to deny any psychosocial barriers or concerns a thtis time.    Expected Outcomes For Labron to participate in rehab free of psychosocial barriers  or concerns    Interventions Encouraged to attend Pulmonary Rehabilitation for the exercise    Continue Psychosocial Services  No Follow up required             Education: Education Goals: Education classes will be provided on a weekly basis, covering required topics. Participant will state understanding/return demonstration of topics presented.  Learning Barriers/Preferences:  Learning Barriers/Preferences - 08/07/23 1040       Learning Barriers/Preferences   Learning Barriers Sight    Learning Preferences None             Education Topics: Know Your Numbers Group instruction that is supported by a PowerPoint presentation. Instructor discusses importance of knowing  and understanding resting, exercise, and post-exercise oxygen saturation, heart rate, and blood pressure. Oxygen saturation, heart rate, blood pressure, rating of perceived exertion, and dyspnea are reviewed along with a normal range for these values.  Flowsheet Row PULMONARY REHAB OTHER RESPIRATORY from 09/21/2023 in [facility]  Date 09/21/23  Educator EP  Instruction Review Code 1- Verbalizes Understanding       Exercise for the Pulmonary Patient Group instruction that is supported by a PowerPoint presentation. Instructor discusses benefits of exercise, core components of exercise, frequency, duration, and intensity of an exercise routine, importance of utilizing pulse oximetry during exercise, safety while exercising, and options of places to exercise outside of rehab.  Flowsheet Row PULMONARY REHAB OTHER RESPIRATORY from 09/14/2023 in [facility]  Date 09/14/23  Educator EP  Instruction Review Code 1- Verbalizes Understanding       MET Level  Group instruction provided by PowerPoint, verbal discussion, and written material to support subject matter. Instructor reviews what METs are and how to increase METs.    Pulmonary Medications Verbally interactive group education provided by instructor with focus on inhaled medications and proper administration. Flowsheet Row PULMONARY REHAB OTHER RESPIRATORY from 09/07/2023 in [facility] for [facility], [facility], [facility] Lung [facility]  Date 09/07/23  Educator RT  Instruction Review Code 1- Verbalizes Understanding       Anatomy and Physiology of the Respiratory System Group instruction provided by PowerPoint, verbal discussion, and written material to support subject matter. Instructor reviews respiratory cycle and anatomical components of the respiratory system and their functions. Instructor also reviews  differences in obstructive and restrictive respiratory diseases with examples of each.  Flowsheet Row PULMONARY REHAB OTHER RESPIRATORY from 11/23/2023 in [facility], V[facility], [facility]  Date 11/23/23  Educator RT  Instruction Review Code 1- Verbalizes Understanding       Oxygen Safety Group instruction provided by PowerPoint, verbal discussion, and written material to support subject matter. There is an overview of "What is Oxygen" and "Why do we need it".  Instructor also reviews how to create a safe environment for oxygen use, the importance of using oxygen as prescribed, and the risks of noncompliance. There is a brief discussion on traveling with oxygen and resources the patient may utilize. Flowsheet Row PULMONARY REHAB OTHER RESPIRATORY from 09/28/2023 in [facility] for [facility], Vascular, [facility]  Date 09/28/23  Educator EP  Instruction Review Code 1- Verbalizes Understanding       Oxygen Use Group instruction  provided by PowerPoint, verbal discussion, and written material to discuss how supplemental oxygen is prescribed and different types of oxygen supply systems. Resources for more information are provided.    Breathing Techniques Group instruction that is supported by demonstration and informational handouts. Instructor discusses the benefits of pursed lip and diaphragmatic breathing and detailed demonstration on how to perform both.  Flowsheet Row PULMONARY REHAB OTHER RESPIRATORY from 10/12/2023 in [facility], V[facility], [facility]  Date 10/12/23  Educator RN  Instruction Review Code 1- Verbalizes Understanding        Risk Factor Reduction Group instruction that is supported by a PowerPoint presentation. Instructor discusses the definition of a risk factor, different risk factors for pulmonary disease, and how the heart and lungs work together.   Pulmonary  Diseases Group instruction provided by PowerPoint, verbal discussion, and written material to support subject matter. Instructor gives an overview of the different type of pulmonary diseases. There is also a discussion on risk factors and symptoms as well as ways to manage the diseases. Flowsheet Row PULMONARY REHAB OTHER RESPIRATORY from 11/16/2023 in [facility] for Heart, Vascular, [facility] Lung Health  Date 11/16/23  Educator RT  Instruction Review Code 1- Verbalizes Understanding       Stress and Energy Conservation Group instruction provided by PowerPoint, verbal discussion, and written material to support subject matter. Instructor gives an overview of stress and the impact it can have on the body. Instructor also reviews ways to reduce stress. There is also a discussion on energy conservation and ways to conserve energy throughout the day. Flowsheet Row PULMONARY REHAB OTHER RESPIRATORY from 10/19/2023 in [facility] for Heart, Vascular, & Lung Health  Date 10/19/23  Educator RN  Instruction Review Code 1- Verbalizes Understanding       Warning Signs and Symptoms Group instruction provided by PowerPoint, verbal discussion, and written material to support subject matter. Instructor reviews warning signs and symptoms of stroke, heart attack, cold and flu. Instructor also reviews ways to prevent the spread of infection. Flowsheet Row PULMONARY REHAB OTHER RESPIRATORY from 10/26/2023 in [facility] for Heart, Vascular, & Lung Health  Date 10/26/23  Educator RN  Instruction Review Code 1- Verbalizes Understanding       Other Education Group or individual verbal, written, or video instructions that support the educational goals of the pulmonary rehab program.    Knowledge Questionnaire Score:  Knowledge Questionnaire Score - 08/07/23 1146       Knowledge Questionnaire Score   Pre Score 10/18             Core  Components/Risk Factors/Patient Goals at Admission:  Personal Goals and Risk Factors at Admission - 08/07/23 1043       Core Components/Risk Factors/Patient Goals on Admission   Improve shortness of breath with ADL's Yes    Intervention Provide education, individualized exercise plan and daily activity instruction to help decrease symptoms of SOB with activities of daily living.    Expected Outcomes Short Term: Improve cardiorespiratory fitness to achieve a reduction of symptoms when performing ADLs;Long Term: Be able to perform more ADLs without symptoms or delay the onset of symptoms             Core Components/Risk Factors/Patient Goals Review:   Goals and Risk Factor Review     Row Name 08/08/23 0920 08/28/23 1554 09/27/23 1053 10/23/23 0955 11/20/23 0944     Core Components/Risk  Factors/Patient Goals Review   Personal Goals Review Improve shortness of breath with ADL's;Develop more efficient breathing techniques such as purse lipped breathing and diaphragmatic breathing and practicing self-pacing with activity. Improve shortness of breath with ADL's;Develop more efficient breathing techniques such as purse lipped breathing and diaphragmatic breathing and practicing self-pacing with activity. Improve shortness of breath with ADL's;Develop more efficient breathing techniques such as purse lipped breathing and diaphragmatic breathing and practicing self-pacing with activity. Improve shortness of breath with ADL's Improve shortness of breath with ADL's   Review Unable to assess, Crosley has not started the program yet. He is scheduled to start the program next week. Goal progressing for improving shortness of breath. Trumaine is currently able to maintain sats >88% on RA while exercising. He is currently exercising on the treadmill and the recumbant elliptical. Goal progressing for developing more efficient breathing techniques such as purse lipped breathing and diaphragmatic breathing; and  practicing self-pacing with activity. We will continue to monitor Tobenna's progress throughout the program. Monthly review of patient's Core Components/Risk Factors/Patient Goals are as follows: Goal progressing for improving shortness of breath. Chau is currently able to maintain sats >88% on RA while exercising. He is currently exercising on the treadmill and the recumbent elliptical. He has increased his METs and workload with dyspnea WNL. Goal met for developing more efficient breathing techniques such as purse lipped breathing and diaphragmatic breathing; and practicing self-pacing with activity. Hieu can initiate PLB on his own and practices diaphragmatic breathing at home. Korbin can self-pace when dyspneic. We will continue to monitor Hurbert's progress throughout the program. Monthly review of patient's Core Components/Risk Factors/Patient Goals are as follows: Goal progressing for improving shortness of breath. Micha is currently able to maintain sats >88% on RA while exercising. He is currently exercising on the treadmill and the recumbent elliptical. He has increased his METs and workload with dyspnea WNL. We will continue to monitor Waylon's progress throughout the program. Monthly review of patient's Core Components/Risk Factors/Patient Goals are as follows: Goal progressing for improving shortness of breath. Yuto is currently able to maintain sats >88% on RA while exercising. He is currently exercising on the treadmill and the recumbent elliptical. We will continue to monitor Solly's progress throughout the program.   Expected Outcomes For Ajeet to improve his shortness of breath with ADLs and develop more efficient breathing techniques such as purse lipped breathing and diaphragmatic breathing; and practicing self-pacing with activity For Terence to improve his shortness of breath with ADLs and develop more efficient breathing techniques such as purse lipped breathing and diaphragmatic breathing; and  practicing self-pacing with activity For Winifred to improve his shortness of breath with ADLs For Haskell to improve his shortness of breath with ADLs For Tryce to improve his shortness of breath with ADLs            Core Components/Risk Factors/Patient Goals at Discharge (Final Review):   Goals and Risk Factor Review - 11/20/23 0944       Core Components/Risk Factors/Patient Goals Review   Personal Goals Review Improve shortness of breath with ADL's    Review Monthly review of patient's Core Components/Risk Factors/Patient Goals are as follows: Goal progressing for improving shortness of breath. Damyan is currently able to maintain sats >88% on RA while exercising. He is currently exercising on the treadmill and the recumbent elliptical. We will continue to monitor Egor's progress throughout the program.    Expected Outcomes For Xzavion to improve his shortness of breath with ADLs  ITP Comments: Pt is making expected progress toward Pulmonary Rehab goals after completing 27 session(s). Recommend continued exercise, life style modification, education, and utilization of breathing techniques to increase stamina and strength, while also decreasing shortness of breath with exertion.  Dr. Genetta Kenning is Medical Director for Pulmonary Rehab at [facility].

## 2023-11-30 ENCOUNTER — Encounter (HOSPITAL_COMMUNITY)
Admission: RE | Admit: 2023-11-30 | Discharge: 2023-11-30 | Disposition: A | Discharge status: Home / Self Care | Source: Ambulatory Visit | Attending: Pulmonary Disease | Admitting: Pulmonary Disease

## 2023-11-30 DIAGNOSIS — R0602 Shortness of breath: Secondary | ICD-10-CM

## 2023-11-30 NOTE — Progress Notes (Signed)
 Daily Session Note  Patient Details  Name: DAXON KYNE MRN: [id number] Date of Birth: 1940/08/22 Referring Provider:   [name] Pulmonary Rehab Walk Test from 08/07/2023 in [facility], & [facility]  Referring Provider [name]    Encounter Date: 11/30/2023  Check In:  Session Check In - 11/30/23 4098       Check-In   Supervising physician immediately available to respond to emergencies [facility] MD immediately available    Physician(s) Slater Duncan, NP    Location [facility]    Staff Present Atlas Lea, MS, ACSM-CEP, Exercise Physiologist;Randi Rochelle Chu, ACSM-CEP, Exercise Physiologist;Casey Carmen Chol, RN, BSN;Samantha Belarus, RD, LDN    Virtual Visit No    Medication changes reported     No    Fall or balance concerns reported    No    Tobacco Cessation No Change    Warm-up and Cool-down Performed as group-led instruction    Resistance Training Performed Yes    VAD Patient? No    PAD/SET Patient? No      Pain Assessment   Currently in Pain? No/denies          Capillary Blood Glucose: No results found for this or any previous visit (from the past 24 hours).    Social History   Tobacco Use  Smoking Status Never  Smokeless Tobacco Never    Goals Met:  Proper associated with RPD/PD & O2 Sat Independence with exercise equipment Exercise tolerated well No report of concerns or symptoms today Strength training completed today  Goals Unmet:  Not Applicable  Comments: Service time is from 0812 to [phone].    Dr. Genetta Kenning is Medical Director for Pulmonary Rehab at [facility].

## 2023-12-05 ENCOUNTER — Encounter (HOSPITAL_COMMUNITY)
Admission: RE | Admit: 2023-12-05 | Discharge: 2023-12-05 | Disposition: A | Discharge status: Home / Self Care | Source: Ambulatory Visit | Attending: Pulmonary Disease | Admitting: Pulmonary Disease

## 2023-12-05 DIAGNOSIS — R0602 Shortness of breath: Secondary | ICD-10-CM | POA: Diagnosis not present

## 2023-12-05 NOTE — Progress Notes (Signed)
 Daily Session Note  Patient Details  Name: [name] MRN: [id number] Date of Birth: 07/09/1940 Referring Provider:   [name] Pulmonary Rehab Walk Test from 08/07/2023 in [facility] for Heart, Vascular, & Lung Health  Referring Provider [name]    Encounter Date: 12/05/2023  Check In:  Session Check In - 12/05/23 4098       Check-In   Supervising physician immediately available to respond to emergencies [facility] MD immediately available    Physician(s) Levin Reamer, NP    [facility]    Staff Present Atlas Lea, MS, ACSM-CEP, Exercise Physiologist;Randi Rochelle Chu, ACSM-CEP, Exercise Physiologist;Aldine Grainger Carmen Chol, RN, BSN    Virtual Visit No    Medication changes reported     No    Fall or balance concerns reported    No    Tobacco Cessation No Change    Warm-up and Cool-down Performed as group-led instruction    Resistance Training Performed Yes    VAD Patient? No    PAD/SET Patient? No      Pain Assessment   Currently in Pain? No/denies    Multiple Pain Sites No          Capillary Blood Glucose: No results found for this or any previous visit (from the past 24 hours).    Social History   Tobacco Use  Smoking Status Never  Smokeless Tobacco Never    Goals Met:  Proper associated with RPD/PD & O2 Sat Independence with exercise equipment Exercise tolerated well No report of concerns or symptoms today Strength training completed today  Goals Unmet:  Not Applicable  Comments: Service time is from 0812 to 0924.    Dr. Genetta Kenning is Medical Director for Pulmonary Rehab at [facility].

## 2023-12-07 ENCOUNTER — Encounter (HOSPITAL_COMMUNITY)
Admission: RE | Admit: 2023-12-07 | Discharge: 2023-12-07 | Disposition: A | Discharge status: Home / Self Care | Source: Ambulatory Visit | Attending: Pulmonary Disease

## 2023-12-07 DIAGNOSIS — R0602 Shortness of breath: Secondary | ICD-10-CM | POA: Diagnosis not present

## 2023-12-07 NOTE — Progress Notes (Signed)
 Daily Session Note  Patient Details  Name: [name] MRN: [id number] Date of Birth: 21-Oct-1940 Referring Provider:   [name] Pulmonary Rehab Walk Test from 08/07/2023 in [facility] for Heart, Vascular, & Lung Health  Referring Provider [name]    Encounter Date: 12/07/2023  Check In:  Session Check In - 12/07/23 0815       Check-In   Physician(s) Lawana Pray, NP    Location [facility]    Staff Present Atlas Lea, MS, ACSM-CEP, Exercise Physiologist;Randi Rochelle Chu, ACSM-CEP, Exercise Physiologist;Jamas Jaquay Carmen Chol, RN, BSN    Virtual Visit No    Medication changes reported     No    Fall or balance concerns reported    No    Tobacco Cessation No Change    Warm-up and Cool-down Performed as group-led Writer Performed Yes    VAD Patient? No    PAD/SET Patient? No      Pain Assessment   Currently in Pain? No/denies    Pain Score 0-No pain    Multiple Pain Sites No          Capillary Blood Glucose: No results found for this or any previous visit (from the past 24 hours).    Social History   Tobacco Use  Smoking Status Never  Smokeless Tobacco Never    Goals Met:  Proper associated with RPD/PD & O2 Sat Independence with exercise equipment Exercise tolerated well No report of concerns or symptoms today Strength training completed today  Goals Unmet:  Not Applicable  Comments: Service time is from 0809 to 0931.    Dr. Genetta Kenning is Medical Director for Pulmonary Rehab at [facility].

## 2023-12-09 ENCOUNTER — Encounter (INDEPENDENT_AMBULATORY_CARE_PROVIDER_SITE_OTHER): Payer: Self-pay

## 2023-12-11 ENCOUNTER — Other Ambulatory Visit: Payer: Self-pay | Admitting: Oncology

## 2023-12-11 ENCOUNTER — Other Ambulatory Visit: Payer: Self-pay

## 2023-12-11 DIAGNOSIS — C9110 Chronic lymphocytic leukemia of B-cell type not having achieved remission: Secondary | ICD-10-CM

## 2023-12-11 MED ORDER — CALQUENCE 100 MG PO TABS
100.0000 mg | ORAL_TABLET | Freq: Every day | ORAL | 4 refills | Status: DC
Start: 2023-12-11 — End: 2024-05-08
  Filled 2023-12-11 – 2023-12-12 (×2): qty 30, 30d supply, fill #0
  Filled 2024-01-01: qty 30, 30d supply, fill #1
  Filled 2024-02-06: qty 30, 30d supply, fill #2
  Filled 2024-03-05: qty 30, 30d supply, fill #3
  Filled 2024-04-09: qty 30, 30d supply, fill #4

## 2023-12-12 ENCOUNTER — Other Ambulatory Visit: Payer: Self-pay

## 2023-12-12 ENCOUNTER — Encounter (HOSPITAL_COMMUNITY)
Admission: RE | Admit: 2023-12-12 | Discharge: 2023-12-12 | Disposition: A | Discharge status: Home / Self Care | Source: Ambulatory Visit | Attending: Pulmonary Disease

## 2023-12-12 VITALS — Wt 162.0 lb

## 2023-12-12 DIAGNOSIS — R0602 Shortness of breath: Secondary | ICD-10-CM

## 2023-12-12 NOTE — Progress Notes (Signed)
 Daily Session Note  Patient Details  Name: [name] MRN: [id number] Date of Birth: [birth year] Referring Provider:   Conrad Mathews Pulmonary Rehab Walk Test from 08/07/2023 in [facility] for Heart, Vascular, & Lung Health  Referring Provider [name]    Encounter Date: 12/12/2023  Check In:  Session Check In - 12/12/23 0818       Check-In   Supervising physician immediately available to respond to emergencies [facility] MD immediately available    Physician(s) [name] Beauvais, NP    [facility]    Staff Present [name] Moats, MS, ACSM-CEP, Exercise Physiologist;[name], ACSM-CEP, Exercise Physiologist;[name] Claudene Candia Levin, RN, BSN    Virtual Visit No    Medication changes reported     No    Fall or balance concerns reported    No    Tobacco Cessation No Change    Warm-up and Cool-down Performed as group-led instruction    Resistance Training Performed Yes    VAD Patient? No    PAD/SET Patient? No      Pain Assessment   Currently in Pain? No/denies    Multiple Pain Sites No          Capillary Blood Glucose: No results found for this or any previous visit (from the past 24 hours).   Exercise Prescription Changes - 12/12/23 0900       Response to Exercise   Blood Pressure (Admit) 124/64    Blood Pressure (Exercise) 128/54    Blood Pressure (Exit) 118/66    Heart Rate (Admit) 62 bpm    Heart Rate (Exercise) 83 bpm    Heart Rate (Exit) 78 bpm    Oxygen Saturation (Admit) 98 %    Oxygen Saturation (Exercise) 99 %    Oxygen Saturation (Exit) 99 %    Rating of Perceived Exertion (Exercise) 11.5    Perceived Dyspnea (Exercise) 1.8    Duration Continue with 30 min of aerobic exercise without signs/symptoms of physical distress.    Intensity THRR unchanged      Progression   Progression Continue to progress workloads to maintain intensity without signs/symptoms of physical distress.      Resistance Training    Weight black bands    Reps 10-15    Time 10 Minutes      Recumbant Elliptical   Level 3    Minutes 15    METs 5.1          Social History   Tobacco Use  Smoking Status Never  Smokeless Tobacco Never    Goals Met:  Independence with exercise equipment Exercise tolerated well No report of concerns or symptoms today Strength training completed today  Goals Unmet:  Not Applicable  Comments: Service time is from 0811 to 0927    Dr. Slater [name] is Medical Director for Pulmonary Rehab at [facility].

## 2023-12-12 NOTE — Progress Notes (Signed)
 Specialty Pharmacy Refill Coordination Note  [name] is a 83 y.o. male contacted today regarding refills of specialty medication(s) Acalabrutinib  Maleate (Calquence )   Patient requested Delivery   Delivery date: 12/13/23   Verified address: [address] [place], [place]   Medication will be filled on 06.24.25.

## 2023-12-12 NOTE — Progress Notes (Signed)
 Discharge Progress Report  Patient Details  Name: [name] MRN: [id number] Date of Birth: 12-25-1940 Referring Provider:   [name] Walk Test from 08/07/2023 in [facility]  Referring Provider [name]     Number of Visits: 103  Reason for Discharge:  Patient reached a stable level of exercise. Patient independent in their exercise. Patient has met program and personal goals.  Smoking History:  Social History   Tobacco Use  Smoking Status Never  Smokeless Tobacco Never    Diagnosis:  Shortness of breath  ADL UCSD:  Pulmonary Assessment Scores     Row Name 08/07/23 1044 12/05/23 0854       ADL UCSD   ADL Phase Entry Exit    SOB Score total 10 34      CAT Score   CAT Score 4 5      mMRC Score   mMRC Score 0 --       Initial Exercise Prescription:  Initial Exercise Prescription - 08/07/23 1100       Date of Initial Exercise RX and Referring Provider   Date 08/07/23    Referring Provider [name]    Expected Discharge Date 11/02/23      Treadmill   MPH 1.8    Grade 0    Minutes 15      Recumbant Elliptical   Level 1    Minutes 15    METs 2      Prescription Details   Frequency (times per week) 2    Duration Progress to 30 minutes of continuous aerobic without signs/symptoms of physical distress      Intensity   THRR 40-80% of Max Heartrate 55-110    Ratings of Perceived Exertion 11-13    Perceived Dyspnea 0-4      Progression   Progression Continue to progress workloads to maintain intensity without signs/symptoms of physical distress.      Resistance Training   Training Prescription Yes    Weight blue bands    Reps 10-15          Discharge Exercise Prescription (Final Exercise Prescription Changes):  Exercise Prescription Changes - 12/12/23 0900       Response to Exercise   Blood Pressure (Admit) 124/64    Blood Pressure (Exercise) 128/54     Blood Pressure (Exit) 118/66    Heart Rate (Admit) 62 bpm    Heart Rate (Exercise) 83 bpm    Heart Rate (Exit) 78 bpm    Oxygen Saturation (Admit) 98 %    Oxygen Saturation (Exercise) 99 %    Oxygen Saturation (Exit) 99 %    Rating of Perceived Exertion (Exercise) 11.5    Perceived Dyspnea (Exercise) 1.8    Duration Continue with 30 min of aerobic exercise without signs/symptoms of physical distress.    Intensity THRR unchanged      Progression   Progression Continue to progress workloads to maintain intensity without signs/symptoms of physical distress.      Resistance Training   Weight black bands    Reps 10-15    Time 10 Minutes      Recumbant Elliptical   Level 3    Minutes 15    METs 5.1          Functional Capacity:  6 Minute Walk     Row Name 08/07/23 1139 12/12/23 1513       6 Minute Walk   Phase Initial  Discharge    Distance 1410 feet 1640 feet    Distance % Change -- 16.31 %    Distance Feet Change -- 230 ft    Walk Time 6 minutes 6 minutes    # of Rest Breaks 0 0    MPH 2.67 3.11    METS 2.05 2.74    RPE 9 12    Perceived Dyspnea  0.5 2    VO2 Peak 7.18 9.6    Symptoms No No    Resting HR 59 bpm 72 bpm    Resting BP 124/72 124/64    Resting Oxygen Saturation  100 % 97 %    Exercise Oxygen Saturation  during 6 min walk 98 % 97 %    Max Ex. HR 74 bpm 118 bpm    Max Ex. BP 138/72 116/60    2 Minute Post BP 122/70 112/60      Interval HR   1 Minute HR 71 89    2 Minute HR 72 98    3 Minute HR 72 110    4 Minute HR 72 118    5 Minute HR 73 118    6 Minute HR 74 100    2 Minute Post HR 52 73    Interval Heart Rate? Yes Yes      Interval Oxygen   Interval Oxygen? Yes Yes    Baseline Oxygen Saturation % 100 % 97 %    1 Minute Oxygen Saturation % 99 % 98 %    1 Minute Liters of Oxygen 0 L 0 L    2 Minute Oxygen Saturation % 99 % 97 %    2 Minute Liters of Oxygen 0 L 0 L    3 Minute Oxygen Saturation % 100 % 97 %    3 Minute Liters of Oxygen  0 L 0 L    4 Minute Oxygen Saturation % 98 % 97 %    4 Minute Liters of Oxygen 0 L 0 L    5 Minute Oxygen Saturation % 100 % 97 %    5 Minute Liters of Oxygen 0 L 0 L    6 Minute Oxygen Saturation % 100 % 97 %    6 Minute Liters of Oxygen 0 L 0 L    2 Minute Post Oxygen Saturation % 100 % 98 %    2 Minute Post Liters of Oxygen 0 L 0 L       Psychological, QOL, Others - Outcomes: PHQ 2/9:    12/05/2023    8:54 AM 08/07/2023   11:55 AM  Depression screen PHQ 2/9  Decreased Interest 0 0  Down, Depressed, Hopeless 0 0  PHQ - 2 Score 0 0  Altered sleeping 0 0  Tired, decreased energy 0 0  Change in appetite 0 0  Feeling bad or failure about yourself  0 0  Trouble concentrating 0 0  Moving slowly or fidgety/restless 0 0  Suicidal thoughts 0 0  PHQ-9 Score 0 0  Difficult doing work/chores Not difficult at all Not difficult at all    Quality of Life:   Personal Goals: Goals established at orientation with interventions provided to work toward goal.  Personal Goals and Risk Factors at Admission - 08/07/23 1043       Core Components/Risk Factors/Patient Goals on Admission   Improve shortness of breath with ADL's Yes    Intervention Provide education, individualized exercise plan and daily activity instruction to help decrease symptoms  of SOB with activities of daily living.    Expected Outcomes Short Term: Improve cardiorespiratory fitness to achieve a reduction of symptoms when performing ADLs;Long Term: Be able to perform more ADLs without symptoms or delay the onset of symptoms           Personal Goals Discharge:  Goals and Risk Factor Review     Row Name 08/08/23 0920 08/28/23 1554 09/27/23 1053 10/23/23 0955 11/20/23 0944     Core Components/Risk Factors/Patient Goals Review   Personal Goals Review Improve shortness of breath with ADL's;Develop more efficient breathing techniques such as purse lipped breathing and diaphragmatic breathing and practicing self-pacing  with activity. Improve shortness of breath with ADL's;Develop more efficient breathing techniques such as purse lipped breathing and diaphragmatic breathing and practicing self-pacing with activity. Improve shortness of breath with ADL's;Develop more efficient breathing techniques such as purse lipped breathing and diaphragmatic breathing and practicing self-pacing with activity. Improve shortness of breath with ADL's Improve shortness of breath with ADL's   Review Unable to assess, [name] has not started the program yet. He is scheduled to start the program next week. Goal progressing for improving shortness of breath. [name] is currently able to maintain sats >88% on RA while exercising. He is currently exercising on the treadmill and the recumbant elliptical. Goal progressing for developing more efficient breathing techniques such as purse lipped breathing and diaphragmatic breathing; and practicing self-pacing with activity. We will continue to monitor [name]'s progress throughout the program. Monthly review of patient's Core Components/Risk Factors/Patient Goals are as follows: Goal progressing for improving shortness of breath. [name] is currently able to maintain sats >88% on RA while exercising. He is currently exercising on the treadmill and the recumbent elliptical. He has increased his METs and workload with dyspnea WNL. Goal met for developing more efficient breathing techniques such as purse lipped breathing and diaphragmatic breathing; and practicing self-pacing with activity. [name] can initiate PLB on his own and practices diaphragmatic breathing at home. [name] can self-pace when dyspneic. We will continue to monitor [name]'s progress throughout the program. Monthly review of patient's Core Components/Risk Factors/Patient Goals are as follows: Goal progressing for improving shortness of breath. [name] is currently able to maintain sats >88% on RA while exercising. He is currently exercising on the  treadmill and the recumbent elliptical. He has increased his METs and workload with dyspnea WNL. We will continue to monitor [name]'s progress throughout the program. Monthly review of patient's Core Components/Risk Factors/Patient Goals are as follows: Goal progressing for improving shortness of breath. [name] is currently able to maintain sats >88% on RA while exercising. He is currently exercising on the treadmill and the recumbent elliptical. We will continue to monitor [name]'s progress throughout the program.   Expected Outcomes For [name] to improve his shortness of breath with ADLs and develop more efficient breathing techniques such as purse lipped breathing and diaphragmatic breathing; and practicing self-pacing with activity For [name] to improve his shortness of breath with ADLs and develop more efficient breathing techniques such as purse lipped breathing and diaphragmatic breathing; and practicing self-pacing with activity For [name] to improve his shortness of breath with ADLs For [name] to improve his shortness of breath with ADLs For [name] to improve his shortness of breath with ADLs    Row Name 12/12/23 8475             Core Components/Risk Factors/Patient Goals Review   Personal Goals Review Improve shortness of breath with ADL's  Review [name] graduated the program on 6/24 completing 31 sessions. Graduation review of patient's Core Components/Risk Factors/Patient Goals are as follows: Goal met for improving his shortness of breath. [name] was able to maintain sats >88% on RA while exercising. He exercised on the treadmill and the recumbent elliptical and was able to increase his METs, workload, speed and incline. [name]'s shortness of breath score and CAT score both increased though he denied any new symptoms. His disease may be progressing but his stamania and endurance have both improved.       Expected Outcomes For [name] to improve his shortness of breath with ADLs post Pulm Rehab           Exercise Goals and Review:  Exercise Goals     Row Name 08/07/23 1151             Exercise Goals   Increase Physical Activity Yes       Intervention Provide advice, education, support and counseling about physical activity/exercise needs.;Develop an individualized exercise prescription for aerobic and resistive training based on initial evaluation findings, risk stratification, comorbidities and participant's personal goals.       Expected Outcomes Short Term: Attend rehab on a regular basis to increase amount of physical activity.;Long Term: Exercising regularly at least 3-5 days a week.;Long Term: Add in home exercise to make exercise part of routine and to increase amount of physical activity.       Increase Strength and Stamina Yes       Intervention Provide advice, education, support and counseling about physical activity/exercise needs.;Develop an individualized exercise prescription for aerobic and resistive training based on initial evaluation findings, risk stratification, comorbidities and participant's personal goals.       Expected Outcomes Short Term: Increase workloads from initial exercise prescription for resistance, speed, and METs.;Short Term: Perform resistance training exercises routinely during rehab and add in resistance training at home;Long Term: Improve cardiorespiratory fitness, muscular endurance and strength as measured by increased METs and functional capacity ( )       Able to understand and use rate of perceived exertion (RPE) scale Yes       Intervention Provide education and explanation on how to use RPE scale       Expected Outcomes Short Term: Able to use RPE daily in rehab to express subjective intensity level;Long Term:  Able to use RPE to guide intensity level when exercising independently       Able to understand and use Dyspnea scale Yes       Intervention Provide education and explanation on how to use Dyspnea scale       Expected Outcomes  Short Term: Able to use Dyspnea scale daily in rehab to express subjective sense of shortness of breath during exertion;Long Term: Able to use Dyspnea scale to guide intensity level when exercising independently       Knowledge and understanding of Target Heart Rate Range (THRR) Yes       Intervention Provide education and explanation of THRR including how the numbers were predicted and where they are located for reference       Expected Outcomes Short Term: Able to state/look up THRR;Long Term: Able to use THRR to govern intensity when exercising independently;Short Term: Able to use daily as guideline for intensity in rehab       Understanding of Exercise Prescription Yes       Intervention Provide education, explanation, and written materials on patient's individual exercise prescription  Expected Outcomes Short Term: Able to explain program exercise prescription;Long Term: Able to explain home exercise prescription to exercise independently          Exercise Goals Re-Evaluation:  Exercise Goals Re-Evaluation     Row Name 08/08/23 0747 08/30/23 0940 09/25/23 0951 10/25/23 0906 11/22/23 0852     Exercise Goal Re-Evaluation   Exercise Goals Review Increase Physical Activity;Able to understand and use Dyspnea scale;Understanding of Exercise Prescription;Increase Strength and Stamina;Knowledge and understanding of Target Heart Rate Range (THRR);Able to understand and use rate of perceived exertion (RPE) scale Increase Physical Activity;Able to understand and use Dyspnea scale;Understanding of Exercise Prescription;Increase Strength and Stamina;Knowledge and understanding of Target Heart Rate Range (THRR);Able to understand and use rate of perceived exertion (RPE) scale Increase Physical Activity;Able to understand and use Dyspnea scale;Understanding of Exercise Prescription;Increase Strength and Stamina;Knowledge and understanding of Target Heart Rate Range (THRR);Able to understand and use rate  of perceived exertion (RPE) scale Increase Physical Activity;Able to understand and use Dyspnea scale;Understanding of Exercise Prescription;Increase Strength and Stamina;Knowledge and understanding of Target Heart Rate Range (THRR);Able to understand and use rate of perceived exertion (RPE) scale Increase Physical Activity;Able to understand and use Dyspnea scale;Understanding of Exercise Prescription;Increase Strength and Stamina;Knowledge and understanding of Target Heart Rate Range (THRR);Able to understand and use rate of perceived exertion (RPE) scale   Comments [name] is scheduled to begin exercise on 2/27. Will monitor and progress as able. [name] has completed 4 exercise sessions. He exercises for 15 min on the recumbent elliptical and treadmill. [name] averages 3.7 METs at level 1 on the recumbent elliptical and 2.3 METs at 1.8 mph on the treadmill. He performs the warmup and cooldown standing without limitations. It is too soon to notate any discernable progressions. Will continue to monitor and progress as able. [name] has completed 11 exercise sessions. He exercises for 15 min on the recumbent elliptical and treadmill. [name] averages 4.0 METs at level 2 on the recumbent elliptical and 2.6 METs at 2 mph and 1% incline on the treadmill. He performs the warmup and cooldown standing without limitations. [name] has increased his level for the recumbent elliptical and speed and incline on the treadmill. METs have increased for both exercise modes. [name] tolerates progressions well. Will continue to monitor and progress as able. [name] has completed 20 exercise sessions. He exercises for 15 min on the recumbent elliptical and treadmill. [name] averages 4.3 METs at level 3 on the recumbent elliptical and 3.2 METs at 2.3 mph and 1.5% incline on the treadmill. He performs the warmup and cooldown standing without limitations. [name] continues to increase his level for the recumbent elliptical and speed and incline on the  treadmill. He is hesitant to increase but will eventually increase. We have discussed home exercise as Arshdeep is currently exercising at home. I am confident in him carrying out an exercise at home. Will continue to monitor and progress as able. Dyon has completed 20 exercise sessions. He exercises for 15 min on the recumbent elliptical and treadmill. [name] averages 4.5 METs at level 3 on the recumbent elliptical and 3.4 METs at 2.4 mph and 2% incline on the treadmill. He performs the warmup and cooldown standing without limitations. Odas has increased his speed and incline on the treadmill as METs have increased. Patient is very hesistant to increase despite encouragement from staff. Will continue to monitor and progress as able.   Expected Outcomes Though exercise at rehab and home, the patient will decrease shortness of breath with  daily activities and feel confident in carrying out an exercise regimen at home. Though exercise at rehab and home, the patient will decrease shortness of breath with daily activities and feel confident in carrying out an exercise regimen at home. Though exercise at rehab and home, the patient will decrease shortness of breath with daily activities and feel confident in carrying out an exercise regimen at home. Though exercise at rehab and home, the patient will decrease shortness of breath with daily activities and feel confident in carrying out an exercise regimen at home. Though exercise at rehab and home, the patient will decrease shortness of breath with daily activities and feel confident in carrying out an exercise regimen at home.    Row Name 12/12/23 1529             Exercise Goal Re-Evaluation   Exercise Goals Review Increase Physical Activity;Able to understand and use Dyspnea scale;Understanding of Exercise Prescription;Increase Strength and Stamina;Knowledge and understanding of Target Heart Rate Range (THRR);Able to understand and use rate of perceived exertion  (RPE) scale       Comments Wiley has completed 31 exercise sessions. His peak METs were 5.1 on the recumbent elliptical and 3.4 on the treadmill.       Expected Outcomes Though exercise at rehab and home, the patient will decrease shortness of breath with daily activities and feel confident in carrying out an exercise regimen at home.          Nutrition & Weight - Outcomes:    Nutrition:  Nutrition Therapy & Goals - 12/07/23 0923       Nutrition Therapy   Diet general healthy diet      Personal Nutrition Goals   Nutrition Goal Patient to improve diet quality by using the plate method as a guide for meal planning to include lean protein/plant protein, fruits, vegetables, whole grains, nonfat dairy as part of a well-balanced diet.   goal in action.   Comments Goals in action. Carrell has medical history of CAD, HTN, CLL, CKD3, shortness of breath. Per cardiology notes, he continues mindfulness of sodum and saturated fat intake. He continues to monitor blood pressure at home. LDL remains at goal. He is down1.8# since starting with our program. Bostyn will benefit from participation in pulmonary rehab and adherence to nutrition and exercise recommendations.      Intervention Plan   Intervention Prescribe, educate and counsel regarding individualized specific dietary modifications aiming towards targeted core components such as weight, hypertension, lipid management, diabetes, heart failure and other comorbidities.;Nutrition handout(s) given to patient.    Expected Outcomes Short Term Goal: Understand basic principles of dietary content, such as calories, fat, sodium, cholesterol and nutrients.;Long Term Goal: Adherence to prescribed nutrition plan.          Nutrition Discharge:   Education Questionnaire Score:  Knowledge Questionnaire Score - 12/05/23 0854       Knowledge Questionnaire Score   Post Score 18/18          Johnluke graduated from the program on 12/12/23. He completed 31  sessions. At the time of graduation he continues to deny any psychosocial barriers or concerns.   Graduation review of patient's Core Components/Risk Factors/Patient Goals are as follows: Goal met for improving his shortness of breath. Iziah was able to maintain sats >88% on RA while exercising. He exercised on the treadmill and the recumbent elliptical and was able to increase his METs, workload, speed and incline. Hriday's shortness of breath score and CAT score  both increased though he denied any new symptoms. His disease may be progressing but his stamania and endurance have both improved.   Goals reviewed with patient; copy given to patient.

## 2023-12-13 ENCOUNTER — Telehealth: Payer: Self-pay | Admitting: *Deleted

## 2023-12-13 NOTE — Telephone Encounter (Signed)
 Notified [name] that Dr. Cloretta does not think his dyspnea on exertion is due to his acalabrutinib . Would be happy to see him on 7/16 at 1010 for 30 minutes. Suggests he also reach out to pulmonary provider, Dr. [name].  [name] is not able to come on 7/16 and his pulmonary provider is out till September. Suggested he reach out to practice to covering provider. Said that another provider there said he could have asthma, which [name] said is ridiculous.  Will have scheduler call him to set up visit for after July 16th.

## 2023-12-13 NOTE — Telephone Encounter (Signed)
 [name] called reporting he has noted a increase in his SOB over few weeks at least. Has more trouble catching his breath on his walks. Just finished ~ 12 weeks of pulmonary rehab and this has been going well. Asking if the acalabrutinib  could be causing this? Scheduled to be seen in August and is asking to move this up to next week.

## 2023-12-14 ENCOUNTER — Telehealth: Payer: Self-pay

## 2023-12-14 NOTE — Telephone Encounter (Signed)
 I contacted the patient and offered an appointment today at 1:40 PM. The patient declined the offer and indicated that he will wait for his next follow-up scheduled for June 28th.

## 2023-12-18 DIAGNOSIS — R972 Elevated prostate specific antigen [PSA]: Secondary | ICD-10-CM | POA: Diagnosis not present

## 2023-12-20 ENCOUNTER — Encounter: Payer: Self-pay | Admitting: Pulmonary Disease

## 2023-12-20 ENCOUNTER — Ambulatory Visit: Admitting: Pulmonary Disease

## 2023-12-20 VITALS — BP 125/76 | HR 61 | Ht 63.5 in | Wt 161.4 lb

## 2023-12-20 DIAGNOSIS — R0602 Shortness of breath: Secondary | ICD-10-CM

## 2023-12-20 NOTE — Patient Instructions (Signed)
 Nice to meet you  I am glad you are doing well with exercise, this is very important and honestly a great sign  Your pulmonary function test were largely normal.  There are some subtle abnormality suggestive of small airways disease, some chronic remodeling and air trapping.  This can be related to your history of asthma.  Certainly have symptoms but as we discussed they are relatively mild.  As such, I do not recommend any treatment or inhalers at this time.  If in the future symptoms were on the similar trajectory, we could consider albuterol  as needed.  If symptoms were to worsen significantly we could consider a more aggressive form of inhalers.  Return to clinic in 3 months with Dr. [name], or sooner as needed

## 2023-12-20 NOTE — Progress Notes (Signed)
 @Patient  ID: [name], male    DOB: 1940/10/24, 83 y.o.   MRN: [id number]  Chief Complaint  Patient presents with   Acute Visit    Pt states SOB    Referring provider: Charlott Dorn LABOR, *  HPI:   83 y.o. man with shortness of breath at rest for acute visit but really with no acute changes.  Multiple prior pulmonary notes reviewed.  Multiple cardiology notes reviewed.  Overall he is doing well.  He notes shortness of breath for about 3 years now.  Notably he has been on his CLL medicine for about 3 to 4 years as well.  He states that really he feels some shortness of breath at rest.  Cannot get a deep breath sometimes at [place].  Comes and goes.  Notably with exertion he is fine.  He walks at least 15 minutes a day on average.  No issues in his neighborhood.  He was enrolled in pulmonary rehab with a recommendation of Dr. [name] given some concern for deconditioning.  No issues with that.  He states upon a rehab maybe helped a little bit but really no significant improvement.  Again with exertion there really is not any symptoms.  We discussed his CLL medication and the package insert indicating this could cause some shortness of breath.  His CLL seems well-controlled and really his symptoms are mild.  No real desire to change medication which I think is very reasonable.  We discussed his PFTs that show a pattern consistent with asthma or small airways disease with elevated DLCO and air trapping.  We discussed treatment for this is really based on symptoms particular with exertion.  He has no cough.  Really with mild symptoms we agreed no treatment for now.  We did discuss empiric trial of albuterol  in the future versus trial of ICS/LABA therapy.  Questionaires / Pulmonary Flowsheets:   ACT:      No data to display          MMRC:     No data to display          Epworth:      No data to display          Tests:   FENO:  No results found for:  NITRICOXIDE  PFT:    Latest Ref Rng & Units 11/10/2023   10:07 AM  PFT Results  FVC-Pre L 2.38   FVC-Predicted Pre % 80   FVC-Post L 2.51   FVC-Predicted Post % 84   Pre FEV1/FVC % % 75   Post FEV1/FCV % % 76   FEV1-Pre L 1.80   FEV1-Predicted Pre % 87   FEV1-Post L 1.91   DLCO uncorrected ml/min/mmHg 25.75   DLCO UNC% % 129   DLCO corrected ml/min/mmHg 25.75   DLCO COR %Predicted % 129   DLVA Predicted % 130   TLC L 5.91   TLC % Predicted % 100   RV % Predicted % 138   Personally interpreted normal spirometry, no bronchodilator response, lung appreciated with air trapping.  DLCO was elevated.  WALK:     12/12/2023    3:13 PM 08/07/2023   11:39 AM  SIX MIN WALK  2 Minute Oxygen Saturation % 97 % 99 %  2 Minute HR 98 72  4 Minute Oxygen Saturation % 97 % 98 %  4 Minute HR 118 72  6 Minute Oxygen Saturation % 97 % 100 %  6 Minute HR 100 74  Imaging: No results found.  Lab Results: Personally reviewed CBC    Component Value Date/Time   WBC 11.8 (H) 08/01/2023 0951   WBC 17.2 (H) 03/08/2020 1815   RBC 4.39 08/01/2023 0951   HGB 13.8 08/01/2023 0951   HGB 14.1 06/23/2017 0758   HCT 42.2 08/01/2023 0951   HCT 42.2 06/23/2017 0758   PLT 133 (L) 08/01/2023 0951   PLT 37 Large & giant platelets (L) 06/23/2017 0758   MCV 96.1 08/01/2023 0951   MCV 92.7 06/23/2017 0758   MCH 31.4 08/01/2023 0951   MCHC 32.7 08/01/2023 0951   RDW 12.8 08/01/2023 0951   RDW 13.1 06/23/2017 0758   LYMPHSABS 3.5 08/01/2023 0951   LYMPHSABS 19.2 (H) 06/23/2017 0758   MONOABS 0.7 08/01/2023 0951   MONOABS 1.0 (H) 06/23/2017 0758   EOSABS 0.3 08/01/2023 0951   EOSABS 0.4 06/23/2017 0758   BASOSABS 0.1 08/01/2023 0951   BASOSABS 0.1 06/23/2017 0758    BMET    Component Value Date/Time   NA 138 08/01/2023 0951   NA 140 12/16/2014 1527   K 4.2 08/01/2023 0951   K 4.1 12/16/2014 1527   CL 105 08/01/2023 0951   CO2 28 08/01/2023 0951   CO2 24 12/16/2014 1527   GLUCOSE 94  08/01/2023 0951   GLUCOSE 71 12/16/2014 1527   BUN 27 (H) 08/01/2023 0951   BUN 17.2 12/16/2014 1527   CREATININE 1.53 (H) 08/01/2023 0951   CREATININE 1.3 12/16/2014 1527   CALCIUM 9.3 08/01/2023 0951   CALCIUM 8.7 12/16/2014 1527   GFRNONAA 45 (L) 08/01/2023 0951   GFRAA 54 (L) 03/08/2020 1815   GFRAA 59 (L) 10/10/2019 0811    BNP    Component Value Date/Time   BNP 217.4 (H) 08/17/2016 1806    ProBNP No results found for: PROBNP  Specialty Problems       Pulmonary Problems   Dyspnea   Acute bronchitis   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    Allergies  Allergen Reactions   Ace Inhibitors     Other reaction(s): cough    Immunization History  Administered Date(s) Administered   Fluad  Trivalent(High Dose 65+) 03/21/2023   Fluzone Influenza virus vaccine,trivalent (IIV3), split virus 04/12/2013, 03/17/2015, 04/21/2016, 02/24/2017   Hepatitis A, Adult 08/16/2018   Influenza Split 03/14/2019, 03/23/2020   Influenza, High Dose Seasonal PF 03/07/2018, 03/24/2020   Influenza-Unspecified 03/22/2022   PFIZER(Purple Top)SARS-COV-2 Vaccination 07/07/2019, 07/27/2019, 08/16/2019, 09/06/2019, 02/03/2020, 02/18/2020   Pfizer Covid-19 Vaccine Bivalent Booster 39yrs & up 10/20/2021   Pfizer(Comirnaty )Fall Seasonal Vaccine 12 years and older 09/29/2022, 03/06/2023, 10/12/2023   Pneumococcal Conjugate-13 11/15/2013   Pneumococcal Polysaccharide-23 03/28/2010   Respiratory Syncytial Virus Vaccine ,Recomb Aduvanted(Arexvy ) 03/09/2022   Tdap 10/15/2018   Zoster Recombinant(Shingrix) 03/21/2018, 07/20/2018   Zoster, Live 03/16/2014, 07/20/2018    Past Medical History:  Diagnosis Date   Acute encephalopathy 08/17/2016   Acute renal failure superimposed on stage 3 chronic kidney disease (HCC) 10/15/2018   AKI (acute kidney injury) (HCC) 08/16/2016   Arthritis    Bacteremia due to Enterobacter species 08/19/2016   Carpal tunnel syndrome, bilateral 10/09/2017    Chest tightness 10/15/2018   CLL (chronic lymphocytic leukemia) (HCC) 12/16/2014   Dyspnea 09/20/2012   Elevated troponin 10/16/2018   Enterobacter sepsis (HCC) 08/19/2016   Fall 10/15/2018   GERD (gastroesophageal reflux disease)    Hepatitis 1966   Drug reaction after taking medication   Hyperglycemia    Hyperlipemia    Hypertension  Hypokalemia 08/16/2016   Lung nodule seen on imaging study    bilateral lungs   Nasal bone fracture 10/15/2018   Right foot pain    SAH (subarachnoid hemorrhage) (HCC) 10/15/2018   Syncope 10/15/2018   Thrombocytopenia (HCC) 10/15/2018   Urinary tract infection with hematuria     Tobacco History: Social History   Tobacco Use  Smoking Status Never  Smokeless Tobacco Never   Counseling given: Not Answered   Continue to not smoke  Outpatient Encounter Medications as of 12/20/2023  Medication Sig   acalabrutinib  maleate (CALQUENCE ) 100 MG tablet Take 1 tablet (100 mg) by mouth daily.   acetaminophen  (TYLENOL ) 325 MG tablet Take 325 mg by mouth daily as needed for headache (pain).   cholecalciferol  (VITAMIN D3) 25 MCG (1000 UNIT) tablet Take 1,000 Units by mouth daily.    clobetasol cream (TEMOVATE) 0.05 % Apply 1 Application topically as needed.   COVID-19 mRNA vaccine 2023-2024 (COMIRNATY ) syringe Inject 0.3 mLs into the muscle daily.   EPINEPHrine  (EPIPEN ) 0.3 mg/0.3 mL DEVI Inject 0.3 mLs (0.3 mg total) into the muscle once.   influenza vaccine adjuvanted (FLUAD ) 0.5 ML injection Inject into the muscle.   metoprolol  succinate (TOPROL -XL) 25 MG 24 hr tablet TAKE 1 TABLET(25 MG) BY MOUTH DAILY   olmesartan (BENICAR) 40 MG tablet Take 40 mg by mouth daily.   simvastatin  (ZOCOR ) 40 MG tablet Take 20 mg by mouth daily.    No facility-administered encounter medications on file as of 12/20/2023.     Review of Systems  Review of Systems  No chest pain with exertion.  Orthopnea or PND.  Comprehensive review of systems otherwise negative. Physical  Exam  BP 125/76 (BP Location: Left Arm, Patient Position: Sitting, Cuff Size: Normal)   Pulse 61   Ht 5' 3.5 (1.613 m)   Wt 161 lb 6.4 oz (73.2 kg)   SpO2 98%   BMI 28.14 kg/m   Wt Readings from Last 5 Encounters:  12/20/23 161 lb 6.4 oz (73.2 kg)  12/12/23 162 lb 0.6 oz (73.5 kg)  11/28/23 164 lb 3.9 oz (74.5 kg)  11/14/23 164 lb 3.9 oz (74.5 kg)  11/10/23 163 lb (73.9 kg)    BMI Readings from Last 5 Encounters:  12/20/23 28.14 kg/m  12/12/23 27.81 kg/m  11/28/23 28.19 kg/m  11/14/23 28.19 kg/m  11/10/23 27.98 kg/m     Physical Exam General: Sitting in chair, no acute distress Eyes: EOMI, returns Neck: Supple, no JVP Pulmonary: Clear, no work of breathing Cardiovascular warm, no edema Abdomen: Distended MSK: No synovitis, no joint effusion Neuro: Normal gait, no weakness Psych normal mood, full affect   Assessment & Plan:   Dyspnea/shortness of breath: Chronic and may be slowly progressive over time, over the last 3 years.  Really present at rest.  No issues with exertion.  Relatively mild symptoms.  We discussed at length the results of PFTs with air trapping and elevated DLCO.  Likely consistent with small disease or asthma.  He had asthma as a child more symptomatic then.  We discussed likely this is yielded chronic remodeling and air trapping.  Often this causes dyspnea on exertion which he really does not have.  Overall his symptoms are mild and we agreed that no medications are needed at this time.  Could consider trial of albuterol  as needed in the future versus ICS/LABA therapy pending severity of symptoms as they evolve over time.  Notably eosinophils have been mildly elevated.  Lastly, there is a  link to his CLL medication and increase in shortness of breath.  Hard to prove.  Given his well-controlled CLL and mild symptoms do not feel it is worth stopping this medication as a trial to see if shortness of breath improves.  This could be reevaluated in the  future at the discretion of his oncologist.   Return in about 3 months (around 03/21/2024) for Follow-up Dr. [name] at [facility].   Donnice JONELLE Beals, MD 12/20/2023   This appointment required 42 minutes of patient care (this includes precharting, chart review, review of results, face-to-face care, etc.).

## 2024-01-01 ENCOUNTER — Other Ambulatory Visit: Payer: Self-pay

## 2024-01-01 ENCOUNTER — Other Ambulatory Visit (HOSPITAL_COMMUNITY): Payer: Self-pay

## 2024-01-01 NOTE — Progress Notes (Signed)
 Specialty Pharmacy Refill Coordination Note  [name] is a 83 y.o. male contacted today regarding refills of specialty medication(s) Acalabrutinib  Maleate (Calquence )   Patient requested Delivery   Delivery date: 01/16/24   Verified address: [address] [place], [place]   Medication will be filled on 01/15/24.

## 2024-01-15 ENCOUNTER — Inpatient Hospital Stay: Admitting: Oncology

## 2024-01-15 ENCOUNTER — Inpatient Hospital Stay: Attending: Oncology

## 2024-01-15 ENCOUNTER — Other Ambulatory Visit: Payer: Self-pay

## 2024-01-15 VITALS — BP 153/72 | HR 60 | Temp 97.8°F | Resp 18 | Ht 63.5 in | Wt 160.9 lb

## 2024-01-15 DIAGNOSIS — N289 Disorder of kidney and ureter, unspecified: Secondary | ICD-10-CM | POA: Insufficient documentation

## 2024-01-15 DIAGNOSIS — D696 Thrombocytopenia, unspecified: Secondary | ICD-10-CM | POA: Diagnosis not present

## 2024-01-15 DIAGNOSIS — Z79899 Other long term (current) drug therapy: Secondary | ICD-10-CM | POA: Insufficient documentation

## 2024-01-15 DIAGNOSIS — C9110 Chronic lymphocytic leukemia of B-cell type not having achieved remission: Secondary | ICD-10-CM | POA: Diagnosis not present

## 2024-01-15 LAB — CMP (CANCER CENTER ONLY)
ALT: 12 U/L (ref 0–44)
AST: 16 U/L (ref 15–41)
Albumin: 4.5 g/dL (ref 3.5–5.0)
Alkaline Phosphatase: 62 U/L (ref 38–126)
Anion gap: 11 (ref 5–15)
BUN: 27 mg/dL — ABNORMAL HIGH (ref 8–23)
CO2: 22 mmol/L (ref 22–32)
Calcium: 9.2 mg/dL (ref 8.9–10.3)
Chloride: 108 mmol/L (ref 98–111)
Creatinine: 1.42 mg/dL — ABNORMAL HIGH (ref 0.61–1.24)
GFR, Estimated: 49 mL/min — ABNORMAL LOW (ref >60)
Glucose, Bld: 96 mg/dL (ref 70–99)
Potassium: 4.2 mmol/L (ref 3.5–5.1)
Sodium: 141 mmol/L (ref 135–145)
Total Bilirubin: 0.6 mg/dL (ref 0.0–1.2)
Total Protein: 6.4 g/dL — ABNORMAL LOW (ref 6.5–8.1)

## 2024-01-15 LAB — CBC WITH DIFFERENTIAL (CANCER CENTER ONLY)
Abs Immature Granulocytes: 0.02 K/uL (ref 0.00–0.07)
Basophils Absolute: 0.1 K/uL (ref 0.0–0.1)
Basophils Relative: 1 %
Eosinophils Absolute: 0.3 K/uL (ref 0.0–0.5)
Eosinophils Relative: 3 %
HCT: 42.1 % (ref 39.0–52.0)
Hemoglobin: 14 g/dL (ref 13.0–17.0)
Immature Granulocytes: 0 %
Lymphocytes Relative: 40 %
Lymphs Abs: 4 K/uL (ref 0.7–4.0)
MCH: 32.8 pg (ref 26.0–34.0)
MCHC: 33.3 g/dL (ref 30.0–36.0)
MCV: 98.6 fL (ref 80.0–100.0)
Monocytes Absolute: 0.7 K/uL (ref 0.1–1.0)
Monocytes Relative: 7 %
Neutro Abs: 4.8 K/uL (ref 1.7–7.7)
Neutrophils Relative %: 49 %
Platelet Count: 116 K/uL — ABNORMAL LOW (ref 150–400)
RBC: 4.27 MIL/uL (ref 4.22–5.81)
RDW: 12.6 % (ref 11.5–15.5)
WBC Count: 9.9 K/uL (ref 4.0–10.5)
nRBC: 0 % (ref 0.0–0.2)

## 2024-01-15 NOTE — Progress Notes (Signed)
 [facility] OFFICE PROGRESS NOTE   Diagnosis: CLL  INTERVAL HISTORY:   [name] returns as scheduled.  He continues acalabrutinib .  No fever, night sweats, anorexia, or bleeding.  No rash or joint pain.  Dyspnea improved with a pulmonary rehab program.  Objective:  Vital signs in last 24 hours:  Blood pressure (!) 153/72, pulse 60, temperature 97.8 F (36.6 C), temperature source Temporal, resp. rate 18, height 5' 3.5 (1.613 m), weight 160 lb 14.4 oz (73 kg), SpO2 100%.    Lymphatics: No cervical, supraclavicular, axillary, or inguinal nodes Resp: Lungs clear bilaterally Cardio: Regular rate and rhythm, left upper anterior chest loop recorder GI: No hepatosplenomegaly Vascular: No leg edema   Lab Results:  Lab Results  Component Value Date   WBC 9.9 01/15/2024   HGB 14.0 01/15/2024   HCT 42.1 01/15/2024   MCV 98.6 01/15/2024   PLT 116 (L) 01/15/2024   NEUTROABS 4.8 01/15/2024    CMP  Lab Results  Component Value Date   NA 141 01/15/2024   K 4.2 01/15/2024   CL 108 01/15/2024   CO2 22 01/15/2024   GLUCOSE 96 01/15/2024   BUN 27 (H) 01/15/2024   CREATININE 1.42 (H) 01/15/2024   CALCIUM 9.2 01/15/2024   PROT 6.4 (L) 01/15/2024   ALBUMIN 4.5 01/15/2024   AST 16 01/15/2024   ALT 12 01/15/2024   ALKPHOS 62 01/15/2024   BILITOT 0.6 01/15/2024   GFRNONAA 49 (L) 01/15/2024   GFRAA 54 (L) 03/08/2020    No results found for: CEA1, CEA, CAN199, CA125  Lab Results  Component Value Date   INR 0.9 11/09/2018   LABPROT 12.5 11/09/2018    Imaging:  No results found.  Medications: I have reviewed the patient's current medications.   Assessment/Plan: CLL Review of the peripheral blood smear is consistent with chronic lymphocytic leukemia 12/16/2014 Peripheral blood flow cytometry consistent with CLL 10/16/2018 MRI brain and cervical spine - Prominent bulky cervical adenopathy within the visualized neck, compatible with history of  CLL. FISH analysis- gain of 13q; 17p and 11q not detected Acalabrutinib  11/22/2018 2. thrombocytopenia-likely secondary to chronic lymphocytic leukemia versus pseudo thrombocytopenia due to platelet clumping; progressive thrombocytopenia June 2018 Trial of prednisone  started 10/17/2018 after admission with a fall/subarachnoid hemorrhage-starting dose 60 mg daily Prednisone  tapered to 40 mg daily beginning 10/22/2018 Prednisone  taper to 30 mg daily beginning 10/29/2018, tapered to off over 6 days beginning 11/13/2018 Bone marrow biopsy 11/09/2018-hypercellular marrow with prominent involvement by CLL, abundant megakaryocytes, lymphocytes represent 82% of all cells, the cytogenetics returned normal 3. Hospitalization with sepsis/UTI with bacteremia (Enterobacter aerogenes) March 2018 4. Hospitalization for syncopal episode resulting in fall. Developed a small right subarachnoid hemorrhage.  5. MRI brain 10/16/2018 - Focal 1-2 cm osseous lesions involving the right parietal and occipital calvarium as above, indeterminate. Findings are of uncertain significance, with no definite corresponding osseous lesion seen on prior CT. 6.  History of idiopathic anaphylactoid reactions 7.  Paroxysmal atrial fibrillation atrial fibrillation noted on a cardiac monitor 2020, not placed on anticoagulation secondary to thrombocytopenia 8.  Renal insufficiency        Disposition: [name] appears stable.  He has stable mild thrombocytopenia.  No evidence for progression of the CLL.  He remains asymptomatic and is tolerating the acalabrutinib  well.  He will remain up-to-date on influenza and pneumonia vaccines.  He will return for an office and lab visit in 6 months.  He will call in the interim for new symptoms.  Arley Hof, MD  01/15/2024  9:15 AM

## 2024-01-17 ENCOUNTER — Telehealth: Payer: Self-pay | Admitting: *Deleted

## 2024-01-17 NOTE — Telephone Encounter (Signed)
 Notified patient that Dr. Cloretta is not concerned with the 116 platelet count. He dropped lower than that 1 year ago and rebounded again. Most likely side effect of the acalabrutinib .

## 2024-01-17 NOTE — Telephone Encounter (Signed)
 [name] called to ask Dr. Cloretta why his platelet count has been trending lower over past several time (116 now). Asking for the cause and what interventions should occur?  He was also asking about the slight elevations in his renal functions. Informed him this could be related to his age w/decline in renal functions, but not a worrisome finding.

## 2024-01-29 ENCOUNTER — Ambulatory Visit: Payer: Medicare PPO | Admitting: Oncology

## 2024-01-29 ENCOUNTER — Other Ambulatory Visit: Payer: Medicare PPO

## 2024-02-01 ENCOUNTER — Ambulatory Visit (HOSPITAL_BASED_OUTPATIENT_CLINIC_OR_DEPARTMENT_OTHER): Admitting: Adult Health

## 2024-02-02 ENCOUNTER — Other Ambulatory Visit (HOSPITAL_COMMUNITY): Payer: Self-pay

## 2024-02-06 ENCOUNTER — Other Ambulatory Visit (HOSPITAL_COMMUNITY): Payer: Self-pay

## 2024-02-06 ENCOUNTER — Other Ambulatory Visit: Payer: Self-pay | Admitting: Pharmacy Technician

## 2024-02-06 ENCOUNTER — Other Ambulatory Visit: Payer: Self-pay

## 2024-02-06 NOTE — Progress Notes (Signed)
 Specialty Pharmacy Refill Coordination Note  ROBERTSON COLCLOUGH is a 83 y.o. male contacted today regarding refills of specialty medication(s) Acalabrutinib  Maleate (Calquence )   Patient requested Delivery   Delivery date: 02/16/24   Verified address: [address]   [place] [id number]   Medication will be filled on 02/15/24.

## 2024-02-12 ENCOUNTER — Other Ambulatory Visit: Payer: Self-pay

## 2024-02-12 NOTE — Progress Notes (Signed)
 Patient called, he was expecting delivery on Friday 8/22 and was concerned that he had not received medication. Advised patient that delivery was schedule for Friday 8/29. Patient asked for medication to be mailed today, 8/25 instead.

## 2024-02-29 ENCOUNTER — Other Ambulatory Visit (HOSPITAL_BASED_OUTPATIENT_CLINIC_OR_DEPARTMENT_OTHER): Payer: Self-pay

## 2024-02-29 MED ORDER — FLUZONE HIGH-DOSE 0.5 ML IM SUSY
0.5000 mL | PREFILLED_SYRINGE | Freq: Once | INTRAMUSCULAR | 0 refills | Status: AC
  Filled 2024-02-29: qty 0.5, 1d supply, fill #0

## 2024-03-05 ENCOUNTER — Other Ambulatory Visit: Payer: Self-pay

## 2024-03-05 ENCOUNTER — Other Ambulatory Visit (HOSPITAL_COMMUNITY): Payer: Self-pay

## 2024-03-05 NOTE — Progress Notes (Signed)
 Specialty Pharmacy Refill Coordination Note  [name] is a 83 y.o. male contacted today regarding refills of specialty medication(s) Acalabrutinib  Maleate (Calquence )   Patient requested Delivery   Delivery date: 03/15/24   Verified address: [address]   [name] [id number]   Medication will be filled on 03/14/24.

## 2024-03-14 DIAGNOSIS — R972 Elevated prostate specific antigen [PSA]: Secondary | ICD-10-CM | POA: Diagnosis not present

## 2024-03-14 DIAGNOSIS — M545 Low back pain, unspecified: Secondary | ICD-10-CM | POA: Diagnosis not present

## 2024-03-14 DIAGNOSIS — R0602 Shortness of breath: Secondary | ICD-10-CM | POA: Diagnosis not present

## 2024-03-14 DIAGNOSIS — N401 Enlarged prostate with lower urinary tract symptoms: Secondary | ICD-10-CM | POA: Diagnosis not present

## 2024-03-14 DIAGNOSIS — Z7185 Encounter for immunization safety counseling: Secondary | ICD-10-CM | POA: Diagnosis not present

## 2024-03-15 DIAGNOSIS — L309 Dermatitis, unspecified: Secondary | ICD-10-CM | POA: Diagnosis not present

## 2024-03-15 DIAGNOSIS — D225 Melanocytic nevi of trunk: Secondary | ICD-10-CM | POA: Diagnosis not present

## 2024-03-15 DIAGNOSIS — Z86018 Personal history of other benign neoplasm: Secondary | ICD-10-CM | POA: Diagnosis not present

## 2024-03-15 DIAGNOSIS — D2271 Melanocytic nevi of right lower limb, including hip: Secondary | ICD-10-CM | POA: Diagnosis not present

## 2024-03-15 DIAGNOSIS — L821 Other seborrheic keratosis: Secondary | ICD-10-CM | POA: Diagnosis not present

## 2024-03-15 DIAGNOSIS — L578 Other skin changes due to chronic exposure to nonionizing radiation: Secondary | ICD-10-CM | POA: Diagnosis not present

## 2024-03-15 DIAGNOSIS — Z85828 Personal history of other malignant neoplasm of skin: Secondary | ICD-10-CM | POA: Diagnosis not present

## 2024-03-15 DIAGNOSIS — D2272 Melanocytic nevi of left lower limb, including hip: Secondary | ICD-10-CM | POA: Diagnosis not present

## 2024-03-15 DIAGNOSIS — L570 Actinic keratosis: Secondary | ICD-10-CM | POA: Diagnosis not present

## 2024-03-22 ENCOUNTER — Other Ambulatory Visit (HOSPITAL_BASED_OUTPATIENT_CLINIC_OR_DEPARTMENT_OTHER): Payer: Self-pay

## 2024-03-22 MED ORDER — COMIRNATY 30 MCG/0.3ML IM SUSY
0.3000 mL | PREFILLED_SYRINGE | Freq: Once | INTRAMUSCULAR | 0 refills | Status: AC
Start: 2024-03-22 — End: 2024-03-23
  Filled 2024-03-22: qty 0.3, 1d supply, fill #0

## 2024-04-04 ENCOUNTER — Other Ambulatory Visit: Payer: Self-pay

## 2024-04-09 ENCOUNTER — Other Ambulatory Visit: Payer: Self-pay

## 2024-04-09 ENCOUNTER — Other Ambulatory Visit (HOSPITAL_COMMUNITY): Payer: Self-pay

## 2024-04-09 NOTE — Progress Notes (Signed)
 Clinical Intervention Note  Clinical Intervention Notes: Patient reported missing 6 doses due to being out of town. Patient resumed medication after returning home. Per last OV in July, patient had no evidence of progression and remains asymptomatic.   Clinical Intervention Outcomes: Improved therapy adherence   [facility] Specialty Pharmacist

## 2024-04-09 NOTE — Progress Notes (Signed)
 Specialty Pharmacy Refill Coordination Note  [name] is a 83 y.o. male contacted today regarding refills of specialty medication(s) Acalabrutinib  Maleate (Calquence )   Patient requested Delivery   Delivery date: 04/22/24   Verified address: [address]   [place] [id number]   Medication will be filled on 04/19/24.

## 2024-04-19 ENCOUNTER — Other Ambulatory Visit: Payer: Self-pay

## 2024-05-08 ENCOUNTER — Other Ambulatory Visit (HOSPITAL_COMMUNITY): Payer: Self-pay

## 2024-05-08 ENCOUNTER — Other Ambulatory Visit: Payer: Self-pay

## 2024-05-08 ENCOUNTER — Other Ambulatory Visit: Payer: Self-pay | Admitting: Oncology

## 2024-05-08 DIAGNOSIS — C9110 Chronic lymphocytic leukemia of B-cell type not having achieved remission: Secondary | ICD-10-CM

## 2024-05-08 MED ORDER — CALQUENCE 100 MG PO TABS
100.0000 mg | ORAL_TABLET | Freq: Every day | ORAL | 4 refills | Status: AC
  Filled 2024-05-08 – 2024-05-10 (×4): qty 30, 30d supply, fill #0
  Filled 2024-06-10: qty 30, 30d supply, fill #1
  Filled 2024-07-04: qty 30, 30d supply, fill #2

## 2024-05-10 ENCOUNTER — Other Ambulatory Visit (HOSPITAL_COMMUNITY): Payer: Self-pay

## 2024-05-10 ENCOUNTER — Other Ambulatory Visit: Payer: Self-pay

## 2024-05-10 NOTE — Progress Notes (Signed)
 Specialty Pharmacy Refill Coordination Note  [name] is a 83 y.o. male contacted today regarding refills of specialty medication(s) Acalabrutinib  Maleate (Calquence )   Patient requested Delivery   Delivery date: 05/14/24   Verified address: [address]   [place] [id number]   Medication will be filled on: 05/13/24

## 2024-05-14 ENCOUNTER — Other Ambulatory Visit: Payer: Self-pay

## 2024-05-14 NOTE — Progress Notes (Signed)
 Specialty Pharmacy Ongoing Clinical Assessment Note  I spoke with the patient's wife. [name] is a 83 y.o. male who is being followed by the specialty pharmacy service for Rx[facility] Oncology   Patient's specialty medication(s) reviewed today: Acalabrutinib  Maleate (Calquence )   Missed doses in the last 4 weeks: 0   Patient/Caregiver did not have any additional questions or concerns.   Therapeutic benefit summary: Patient is achieving benefit   Adverse events/side effects summary: No adverse events/side effects   Patient's therapy is appropriate to: Continue    Goals Addressed             This Visit's Progress    Slow Disease Progression   On track    Patient is on track. Patient will maintain adherence. Per provider note from 01/15/24 patient is hematologically stable.          Follow up: 6 months  [name] Specialty Pharmacist

## 2024-05-24 DIAGNOSIS — I480 Paroxysmal atrial fibrillation: Secondary | ICD-10-CM | POA: Diagnosis not present

## 2024-05-24 DIAGNOSIS — I10 Essential (primary) hypertension: Secondary | ICD-10-CM | POA: Diagnosis not present

## 2024-05-24 DIAGNOSIS — R972 Elevated prostate specific antigen [PSA]: Secondary | ICD-10-CM | POA: Diagnosis not present

## 2024-05-24 DIAGNOSIS — E782 Mixed hyperlipidemia: Secondary | ICD-10-CM | POA: Diagnosis not present

## 2024-05-24 DIAGNOSIS — Z1331 Encounter for screening for depression: Secondary | ICD-10-CM | POA: Diagnosis not present

## 2024-05-24 DIAGNOSIS — C9110 Chronic lymphocytic leukemia of B-cell type not having achieved remission: Secondary | ICD-10-CM | POA: Diagnosis not present

## 2024-05-24 DIAGNOSIS — N401 Enlarged prostate with lower urinary tract symptoms: Secondary | ICD-10-CM | POA: Diagnosis not present

## 2024-05-24 DIAGNOSIS — R001 Bradycardia, unspecified: Secondary | ICD-10-CM | POA: Diagnosis not present

## 2024-05-24 DIAGNOSIS — N183 Chronic kidney disease, stage 3 unspecified: Secondary | ICD-10-CM | POA: Diagnosis not present

## 2024-05-24 DIAGNOSIS — Z0000 Encounter for general adult medical examination without abnormal findings: Secondary | ICD-10-CM | POA: Diagnosis not present

## 2024-05-24 DIAGNOSIS — I2510 Atherosclerotic heart disease of native coronary artery without angina pectoris: Secondary | ICD-10-CM | POA: Diagnosis not present

## 2024-06-03 ENCOUNTER — Other Ambulatory Visit: Payer: Self-pay

## 2024-06-10 ENCOUNTER — Other Ambulatory Visit: Payer: Self-pay

## 2024-06-10 ENCOUNTER — Encounter: Payer: Self-pay | Admitting: Orthopedic Surgery

## 2024-06-10 ENCOUNTER — Other Ambulatory Visit (INDEPENDENT_AMBULATORY_CARE_PROVIDER_SITE_OTHER): Payer: Self-pay

## 2024-06-10 ENCOUNTER — Ambulatory Visit: Admitting: Orthopedic Surgery

## 2024-06-10 DIAGNOSIS — M25561 Pain in right knee: Secondary | ICD-10-CM

## 2024-06-10 DIAGNOSIS — M25562 Pain in left knee: Secondary | ICD-10-CM

## 2024-06-10 DIAGNOSIS — G8929 Other chronic pain: Secondary | ICD-10-CM

## 2024-06-10 MED ORDER — METHYLPREDNISOLONE ACETATE 40 MG/ML IJ SUSP
40.0000 mg | INTRAMUSCULAR | Status: AC | PRN
  Administered 2024-06-10: 40 mg via INTRA_ARTICULAR

## 2024-06-10 MED ADMIN — Lidocaine HCl Local Inj 1%: 5 mL | NDC 00409427617

## 2024-06-10 MED ADMIN — Methylprednisolone Acetate Inj Susp 40 MG/ML: 40 mg | INTRA_ARTICULAR | NDC 00009028002

## 2024-06-10 NOTE — Progress Notes (Signed)
 Specialty Pharmacy Refill Coordination Note  [name] is a 83 y.o. male contacted today regarding refills of specialty medication(s) Acalabrutinib  Maleate (Calquence )   Patient requested Delivery   Delivery date: 06/12/24   Verified address: [address]   [place] [id number]   Medication will be filled on: 06/11/24

## 2024-06-10 NOTE — Progress Notes (Signed)
 "  Office Visit Note   Patient: [name]           Date of Birth: 31-Jan-1941           MRN: [id number] Visit Date: 06/10/2024              Requested by: Charlott Dorn LABOR, MD [address] [place],  [place] [address] PCP: Charlott Dorn LABOR, MD  Chief Complaint  Patient presents with   Right Knee - Pain   Left Knee - Pain      HPI: Discussed the use of AI scribe software for clinical note transcription with the patient, who gave verbal consent to proceed.  History of Present Illness [name] is an 83 year old male with bilateral knee osteoarthritis and chondrocalcinosis who presents for evaluation of worsening right knee pain.  Radiographs demonstrate chondrocalcinosis of the meniscus bilaterally, periarticular bony spurs in all three compartments, chondrocalcinosis of the popliteal artery, and congruent joint space narrowing.  He describes chronic bilateral knee pain, with the right knee being more symptomatic. One week prior to presentation, he experienced an acute exacerbation of right knee pain, described as severe at onset. Currently, the pain is less intense but remains above baseline, with intermittent sharp pain precipitated by certain movements, such as stepping down incorrectly. He denies swelling, erythema, or instability.  He has previously received intra-articular corticosteroid injections in both knees without sustained relief, particularly in the right knee. He has not benefited from prior hyaluronic acid injections.  He remains active, walking approximately 1.75 miles daily and participating in a twice-weekly exercise program at [employer]. He expresses interest in resuming this program if symptoms improve.     Assessment & Plan: Visit Diagnoses:  1. Chronic pain of both knees     Plan: Assessment and Plan Assessment & Plan Bilateral knee osteoarthritis with chondrocalcinosis Chronic, severe bilateral knee osteoarthritis  with advanced chondrocalcinosis, periarticular osteophytes, and joint space narrowing. Prior treatments provided limited relief. Total knee arthroplasty is the only definitive treatment, but he declined surgery. - Administered intra-articular corticosteroid injections to both knees. - Advised viscosupplementation is ineffective in advanced osteoarthritis with bone-on-bone changes. - Discussed total knee arthroplasty as the only definitive long-term solution, but he declined surgery. - Encouraged continued exercise and gym-based programs to maintain knee strength and function. - Advised up to three or four corticosteroid injections per year for symptom relief. - Recommended follow-up with orthopaedic surgery at a more convenient location if preferred, and to call when ready for another injection.      Follow-Up Instructions: Return if symptoms worsen or fail to improve.   Ortho Exam  Patient is alert, oriented, no adenopathy, well-dressed, normal affect, normal respiratory effort. Physical Exam    Examination patient has a mild effusion of both knees collaterals and cruciates are stable.  There is crepitation with range of motion.  There is tenderness to palpation medial lateral joint line.   Imaging: XR Knee 1-2 Views Right Result Date: 06/10/2024 2 view radiographs of the right knee shows tricompartmental arthritic changes with bony spurs in all 3 compartments with calcification of the meniscus  XR Knee 1-2 Views Left Result Date: 06/10/2024 2 view radiographs of the left knee shows tricompartmental arthritic changes with osteophytic bone spurs in all 3 compartments with calcification of the meniscus  No images are attached to the encounter.  Labs: Lab Results  Component Value Date   HGBA1C 5.4 10/16/2018   LABURIC 6.2 10/31/2018  LABURIC 4.5 08/19/2016   REPTSTATUS 08/21/2016 FINAL 08/16/2016   GRAMSTAIN No WBC Seen 03/02/2013   GRAMSTAIN No Squamous Epithelial Cells Seen  03/02/2013   GRAMSTAIN Few Gram Negative Rods 03/02/2013   CULT  08/16/2016    NO GROWTH 5 DAYS Performed at [facility], [address] [place], [place] [address]    LABORGA ENTEROBACTER AEROGENES (A) 08/16/2016     Lab Results  Component Value Date   ALBUMIN 4.5 01/15/2024   ALBUMIN 4.1 08/01/2023   ALBUMIN 4.6 01/26/2023    Lab Results  Component Value Date   MG 2.1 08/19/2016   MG 1.7 08/18/2016   No results found for: VD25OH  No results found for: PREALBUMIN    Latest Ref Rng & Units 01/15/2024    8:14 AM 08/01/2023    9:51 AM 01/26/2023    9:22 AM  CBC EXTENDED  WBC 4.0 - 10.5 K/uL 9.9  11.8  10.2   RBC 4.22 - 5.81 MIL/uL 4.27  4.39  4.96   Hemoglobin 13.0 - 17.0 g/dL 85.9  86.1  84.4   HCT 39.0 - 52.0 % 42.1  42.2  45.7   Platelets 150 - 400 K/uL 116  133  138   NEUT# 1.7 - 7.7 K/uL 4.8  7.1  5.8   Lymph# 0.7 - 4.0 K/uL 4.0  3.5  3.3      There is no height or weight on file to calculate BMI.  Orders:  Orders Placed This Encounter  Procedures   XR Knee 1-2 Views Left   XR Knee 1-2 Views Right   No orders of the defined types were placed in this encounter.    Procedures: Large Joint Inj: bilateral knee on 06/10/2024 11:54 AM Indications: pain and diagnostic evaluation Details: 22 G 1.5 in needle, anteromedial approach  Arthrogram: No  Medications (Right): 5 mL lidocaine  1 %; 40 mg methylPREDNISolone  acetate 40 MG/ML Medications (Left): 5 mL lidocaine  1 %; 40 mg methylPREDNISolone  acetate 40 MG/ML Outcome: tolerated well, no immediate complications Procedure, treatment alternatives, risks and benefits explained, specific risks discussed. Consent was given by the patient. Immediately prior to procedure a time out was called to verify the correct patient, procedure, equipment, support staff and site/side marked as required. Patient was prepped and draped in the usual sterile fashion.      Clinical Data: No additional  findings.  ROS:  All other systems negative, except as noted in the HPI. Review of Systems  Objective: Vital Signs: There were no vitals taken for this visit.  Specialty Comments:  No specialty comments available.  PMFS History: Patient Active Problem List   Diagnosis Date Noted   Paroxysmal atrial fibrillation (HCC) 03/23/2021   Unilateral primary osteoarthritis, right knee 09/16/2020   Acute abdomen 08/24/2020   Allergy 08/24/2020   Contact dermatitis 08/24/2020   Microscopic hematuria 08/24/2020   Benign paroxysmal positional vertigo 08/24/2020   Benign prostatic hyperplasia with lower urinary tract symptoms 08/24/2020   Bitten or stung by nonvenomous insect and other nonvenomous arthropods, initial encounter 08/24/2020   COVID-19 virus IgG antibody test result unknown 08/24/2020   ED (erectile dysfunction) of organic origin 08/24/2020   Elevated PSA 08/24/2020   Fatigue 08/24/2020   Gastro-esophageal reflux disease without esophagitis 08/24/2020   Hepatitis A vaccination not up to date 08/24/2020   History of adenomatous polyp of colon 08/24/2020   Inflamed seborrheic keratosis 08/24/2020   Insomnia 08/24/2020   Low back pain 08/24/2020   Migraine without  aura, not refractory 08/24/2020   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 08/24/2020   Nocturia 08/24/2020   Osteoarthritis of knee 08/24/2020   Other long term (current) drug therapy 08/24/2020   Pain in right ankle and joints of right foot 08/24/2020   Paresthesia of both hands 08/24/2020   Prediabetes 08/24/2020   Acute bronchitis 08/24/2020   Tendinitis 08/24/2020   Skin sensation disturbance 08/24/2020   Sleep disorder 08/24/2020   Subarachnoid hemorrhage following injury without open intracranial wound and with prolonged loss of consciousness (more than 24 hours) and return to pre-existing conscious level (HCC) 08/24/2020   Tinea cruris 08/24/2020   Vitamin D  deficiency 08/24/2020    Essential hypertension 08/24/2020   Goals of care, counseling/discussion 11/13/2018   Coronary artery disease involving native coronary artery of native heart without angina pectoris 11/01/2018   Facial laceration 10/23/2018   Elevated troponin 10/16/2018   Mixed hyperlipidemia 10/15/2018   Syncope 10/15/2018   Nasal bone fracture 10/15/2018   Chest tightness 10/15/2018   SAH (subarachnoid hemorrhage) (HCC) 10/15/2018   Thrombocytopenia 10/15/2018   Fall 10/15/2018   Carpal tunnel syndrome, bilateral 10/09/2017   Right foot pain    Enterobacter sepsis (HCC) 08/19/2016   Bacteremia due to Enterobacter species 08/19/2016   Acute encephalopathy 08/17/2016   Hyperglycemia    Urinary tract infection with hematuria    Hypokalemia 08/16/2016   CLL (chronic lymphocytic leukemia) (HCC) 12/16/2014   Dyspnea 09/20/2012   Past Medical History:  Diagnosis Date   Acute encephalopathy 08/17/2016   Acute renal failure superimposed on stage 3 chronic kidney disease (HCC) 10/15/2018   AKI (acute kidney injury) 08/16/2016   Arthritis    Bacteremia due to Enterobacter species 08/19/2016   Carpal tunnel syndrome, bilateral 10/09/2017   Chest tightness 10/15/2018   CLL (chronic lymphocytic leukemia) (HCC) 12/16/2014   Dyspnea 09/20/2012   Elevated troponin 10/16/2018   Enterobacter sepsis (HCC) 08/19/2016   Fall 10/15/2018   GERD (gastroesophageal reflux disease)    Hepatitis 1966   Drug reaction after taking medication   Hyperglycemia    Hyperlipemia    Hypertension    Hypokalemia 08/16/2016   Lung nodule seen on imaging study    bilateral lungs   Nasal bone fracture 10/15/2018   Right foot pain    SAH (subarachnoid hemorrhage) (HCC) 10/15/2018   Syncope 10/15/2018   Thrombocytopenia 10/15/2018   Urinary tract infection with hematuria     Family History  Problem Relation Age of Onset   Sudden death Mother 23       possible botulism   Dementia Father    Seizures Sister     Past Surgical History:   Procedure Laterality Date   APPENDECTOMY     arthroscoyp  06/20/1998   left knee   COLONOSCOPY W/ POLYPECTOMY     LOOP RECORDER INSERTION N/A 10/18/2018   Procedure: LOOP RECORDER INSERTION;  Surgeon: Inocencio Soyla Lunger, MD;  Location: [facility];  Service: Cardiovascular;  Laterality: N/A;   LUMBAR LAMINECTOMY/DECOMPRESSION MICRODISCECTOMY  06/27/2012   Procedure: LUMBAR LAMINECTOMY/DECOMPRESSION MICRODISCECTOMY 1 LEVEL;  Surgeon: Alm GORMAN Molt, MD;  Location: [facility]URO [facility];  Service: Neurosurgery;  Laterality: Bilateral;  bilateral four-five laminectony   MOHS SURGERY  10/31/2023   Top of head   SKIN CANCER EXCISION  5-6 years ago   moe-s surgery- basal b   TONSILLECTOMY     Social History   Occupational History   Occupation: Retired  Tobacco Use   Smoking  status: Never   Smokeless tobacco: Never  Vaping Use   Vaping status: Never Used  Substance and Sexual Activity   Alcohol use: Yes    Alcohol/week: 2.0 standard drinks of alcohol    Types: 2 Cans of beer per week    Comment: rare 2 beers per month   Drug use: No   Sexual activity: Not on file         "

## 2024-07-04 ENCOUNTER — Other Ambulatory Visit: Payer: Self-pay

## 2024-07-04 ENCOUNTER — Other Ambulatory Visit: Payer: Self-pay | Admitting: Pharmacy Technician

## 2024-07-04 ENCOUNTER — Other Ambulatory Visit (HOSPITAL_COMMUNITY): Payer: Self-pay

## 2024-07-04 NOTE — Progress Notes (Signed)
 Specialty Pharmacy Refill Coordination Note  [name] is a 84 y.o. male contacted today regarding refills of specialty medication(s) Acalabrutinib  Maleate (Calquence )   Patient requested Delivery   Delivery date: 07/10/24   Verified address: [address]  [place]   Medication will be filled on: 07/09/24

## 2024-07-09 ENCOUNTER — Other Ambulatory Visit: Payer: Self-pay

## 2024-07-11 ENCOUNTER — Encounter (HOSPITAL_BASED_OUTPATIENT_CLINIC_OR_DEPARTMENT_OTHER): Payer: Self-pay | Admitting: Cardiovascular Disease

## 2024-07-11 ENCOUNTER — Ambulatory Visit (HOSPITAL_BASED_OUTPATIENT_CLINIC_OR_DEPARTMENT_OTHER): Admitting: Cardiovascular Disease

## 2024-07-11 VITALS — BP 146/74 | HR 54 | Ht 63.5 in | Wt 162.8 lb

## 2024-07-11 DIAGNOSIS — I2510 Atherosclerotic heart disease of native coronary artery without angina pectoris: Secondary | ICD-10-CM | POA: Diagnosis not present

## 2024-07-11 DIAGNOSIS — I10 Essential (primary) hypertension: Secondary | ICD-10-CM

## 2024-07-11 DIAGNOSIS — I480 Paroxysmal atrial fibrillation: Secondary | ICD-10-CM

## 2024-07-11 DIAGNOSIS — R7303 Prediabetes: Secondary | ICD-10-CM | POA: Diagnosis not present

## 2024-07-11 NOTE — Progress Notes (Signed)
 " Cardiology Office Note:  .   Date:  07/11/2024  ID:  [name], DOB 16-Aug-1940, MRN [id number] PCP: Charlott Dorn LABOR, MD  [facility] Providers Cardiologist:  Annabella Scarce, MD Electrophysiologist:  Will Gladis Norton, MD    History of Present Illness: [name] is a 84 y.o. male with CAD, hypertension, hyperlipidemia, prior subarachnoid hemorrhage, and CLL here for follow-up.  He was previously a patient of Dr. Jeffrie and is now transferring due to location.  He had a small subarachnoid hemorrhage after an episode of syncope.  A loop recorder was implanted and no atrial fibrillation has been noted since 2020.  His last echo in 2020 revealed LVEF greater than 65% with a LVOT gradient of 25 mmHg.  When he last saw Dr. Jeffrie 03/2022 his CLL was stable.  He was walking 2 miles daily.  He did note some occasional shortness of breath.  He had a coronary CT-01/2019 that revealed occlusion of the distal RCA and nonobstructive disease in the LAD.  He has not been on aspirin  due to the subarachnoid hemorrhage.  He saw Dr. Norton 08/2022 for ILR removal but decided not to remove it.   He experienced a syncope episode on October 15, 2018, during a walk, which led to the implantation of a heart monitor. The monitor has been in place for three and a half years, showing normal results except for one brief incident of atrial fibrillation triggered by stress.   At his visit 07/2023 he reported worsening shortness of breath with exertion.  His pulmonologist attributed to deconditioning and he started pulmonary rehab.  He had known coronary calcifications which were managed medically.  He was referred for a Lexiscan  Myoview  07/2023 which revealed LVEF 68% and no ischemia.   Discussed the use of AI scribe software for clinical note transcription with the patient, who gave verbal consent to proceed.  History of Present Illness [name] participates in a pulmonary program at [facility]  on Tuesdays and Thursdays, engaging in treadmill and elliptical bike exercises. He also walks for 49 minutes every morning, although his routine has been less consistent due to cold weather. His breathing becomes more difficult in the cold, but he does not experience shortness of breath during exercise.  He takes his blood pressure medication daily around 9 AM but does not monitor his blood pressure at home. He recalls a high reading in July, although earlier that month, it was normal. He is currently on two blood pressure medications, which is a change from his previous regimen. During his participation in the pulmonary program, his blood pressure was typically around 128/80-90 mmHg. He does not own a blood pressure machine and prefers not to purchase one, but is open to checking his blood pressure at a pharmacy.  His cholesterol levels have been excellent and have not been a recent issue.  ROS:  As per HPI  Studies Reviewed: SABRA   EKG Interpretation Date/Time:  Thursday July 11 2024 08:26:00 EST Ventricular Rate:  52 PR Interval:  160 QRS Duration:  94 QT Interval:  410 QTC Calculation: 381 R Axis:   57  Text Interpretation: Sinus bradycardia When compared with ECG of 04-Aug-2023 09:00, No significant change was found Confirmed by Scarce Annabella ([id number]) on 07/11/2024 8:41:51 AM   Lexiscan  Myoview  07/2023:   The study is normal. The study is low risk.   No ST deviation was noted.   LV perfusion is normal. There is  no evidence of ischemia. There is no evidence of infarction.   Left ventricular function is normal. Nuclear stress EF: 68%. The left ventricular ejection fraction is hyperdynamic (>65%). End diastolic cavity size is normal. End systolic cavity size is normal.    Risk Assessment/Calculations:     HYPERTENSION CONTROL Vitals:   07/11/24 0831 07/11/24 0851  BP: (!) 158/80 (!) 146/74    The patient's blood pressure is elevated above target today.  In order to address the  patient's elevated BP: Blood pressure will be monitored at home to determine if medication changes need to be made.      Physical Exam:   VS:  BP (!) 146/74   Pulse (!) 54   Ht 5' 3.5 (1.613 m)   Wt 162 lb 12.8 oz (73.8 kg)   SpO2 99%   BMI 28.39 kg/m  , BMI Body mass index is 28.39 kg/m. GENERAL:  Well appearing HEENT: Pupils equal round and reactive, fundi not visualized, oral mucosa unremarkable NECK:  No jugular venous distention, waveform within normal limits, carotid upstroke brisk and symmetric, no bruits, no thyromegaly LUNGS:  Clear to auscultation bilaterally HEART:  RRR.  PMI not displaced or sustained,S1 and S2 within normal limits, no S3, no S4, no clicks, no rubs, no murmurs ABD:  Flat, positive bowel sounds normal in frequency in pitch, no bruits, no rebound, no guarding, no midline pulsatile mass, no hepatomegaly, no splenomegaly EXT:  2 plus pulses throughout, no edema, no cyanosis no clubbing SKIN:  No rashes no nodules NEURO:  Cranial nerves II through XII grossly intact, motor grossly intact throughout PSYCH:  Cognitively intact, oriented to person place and time   ASSESSMENT AND PLAN: .    Assessment & Plan # Primary hypertension Blood pressure variable, recent 146/74 mmHg. Possible white coat hypertension. Goal <130/80 mmHg.  He does not check it and is not willing to get a home BP cuff or check it at home.  He will go to a insurance claims handler.  Offered for him to follow up with PCP, but he prefers to return to us . - Check blood pressure at pharmacy. - Continue olmesartan and metoprolol . - Track readings on provided sheet. - Follow up with pharmacy team in a couple of months to review and adjust medications.  # Syncope:  No recurrent episodes.  ILR in place.  No events.  # Hyperlipidemia:   Lipids controlled on simvastatin .  Dispo: f/u with PharmD in 2 months.  F/u with me in 1 year per patient request.   Signed, Annabella Scarce, MD   "

## 2024-07-11 NOTE — Patient Instructions (Signed)
 Medication Instructions:  Your physician recommends that you continue on your current medications as directed. Please refer to the Current Medication list given to you today.   *If you need a refill on your cardiac medications before your next appointment, please call your pharmacy*  Lab Work: NONE  Testing/Procedures: NONE  Follow-Up: At [facility], you and your health needs are our priority.  As part of our continuing mission to provide you with exceptional heart care, our providers are all part of one team.  This team includes your primary Cardiologist (physician) and Advanced Practice Providers or APPs (Physician Assistants and Nurse Practitioners) who all work together to provide you with the care you need, when you need it.  Your next appointment:   2 month(s)  Provider:   Allean Mink, PharmD   1 YEAR WITH DR [facility]    We recommend signing up for the patient portal called MyChart.  Sign up information is provided on this After Visit Summary.  MyChart is used to connect with patients for Virtual Visits (Telemedicine).  Patients are able to view lab/test results, encounter notes, upcoming appointments, etc.  Non-urgent messages can be sent to your provider as well.   To learn more about what you can do with MyChart, go to [email or web address].   Other Instructions MONITOR YOUR BLOOD PRESSURE 1 TO 2 TIMES A DAY.  BRING YOUR READINGS AND MACHINE TO YOUR FOLLOW UP APPOINTMENT

## 2024-07-16 ENCOUNTER — Telehealth: Payer: Self-pay | Admitting: Oncology

## 2024-07-16 ENCOUNTER — Encounter: Payer: Self-pay | Admitting: Oncology

## 2024-07-16 NOTE — Telephone Encounter (Signed)
 Returned PT phone call to reschedule appt due to weather; unable to leave voicemail message.

## 2024-07-17 ENCOUNTER — Inpatient Hospital Stay

## 2024-07-17 ENCOUNTER — Inpatient Hospital Stay: Admitting: Oncology

## 2024-07-18 ENCOUNTER — Telehealth: Payer: Self-pay | Admitting: Oncology

## 2024-07-18 NOTE — Telephone Encounter (Signed)
 PT rescheduled appt due to the weather.

## 2024-07-22 ENCOUNTER — Inpatient Hospital Stay

## 2024-07-22 ENCOUNTER — Inpatient Hospital Stay: Admitting: Oncology

## 2024-07-23 ENCOUNTER — Other Ambulatory Visit: Payer: Self-pay

## 2024-08-02 ENCOUNTER — Encounter: Admitting: Pulmonary Disease

## 2024-08-13 ENCOUNTER — Inpatient Hospital Stay

## 2024-08-13 ENCOUNTER — Inpatient Hospital Stay: Admitting: Oncology
